# Patient Record
Sex: Female | Born: 1935 | Race: White | Hispanic: No | Smoking: Never smoker
Health system: Southern US, Community
[De-identification: ages and names within clinical notes are randomized; demographics above are authoritative.]

## PROBLEM LIST (undated history)

## (undated) DIAGNOSIS — M899 Disorder of bone, unspecified: Secondary | ICD-10-CM

## (undated) DIAGNOSIS — R112 Nausea with vomiting, unspecified: Secondary | ICD-10-CM

## (undated) DIAGNOSIS — Z85828 Personal history of other malignant neoplasm of skin: Secondary | ICD-10-CM

## (undated) DIAGNOSIS — M2540 Effusion, unspecified joint: Secondary | ICD-10-CM

## (undated) DIAGNOSIS — M1990 Unspecified osteoarthritis, unspecified site: Secondary | ICD-10-CM

## (undated) DIAGNOSIS — T4145XA Adverse effect of unspecified anesthetic, initial encounter: Secondary | ICD-10-CM

## (undated) DIAGNOSIS — M2550 Pain in unspecified joint: Secondary | ICD-10-CM

## (undated) DIAGNOSIS — H353190 Nonexudative age-related macular degeneration, unspecified eye, stage unspecified: Secondary | ICD-10-CM

## (undated) DIAGNOSIS — M810 Age-related osteoporosis without current pathological fracture: Secondary | ICD-10-CM

## (undated) DIAGNOSIS — R350 Frequency of micturition: Secondary | ICD-10-CM

## (undated) DIAGNOSIS — Z87440 Personal history of urinary (tract) infections: Secondary | ICD-10-CM

## (undated) DIAGNOSIS — K5790 Diverticulosis of intestine, part unspecified, without perforation or abscess without bleeding: Secondary | ICD-10-CM

## (undated) DIAGNOSIS — M719 Bursopathy, unspecified: Secondary | ICD-10-CM

## (undated) DIAGNOSIS — Z9889 Other specified postprocedural states: Secondary | ICD-10-CM

## (undated) DIAGNOSIS — I4891 Unspecified atrial fibrillation: Secondary | ICD-10-CM

## (undated) DIAGNOSIS — M8580 Other specified disorders of bone density and structure, unspecified site: Secondary | ICD-10-CM

## (undated) DIAGNOSIS — M7580 Other shoulder lesions, unspecified shoulder: Secondary | ICD-10-CM

## (undated) DIAGNOSIS — R51 Headache: Secondary | ICD-10-CM

## (undated) DIAGNOSIS — T8859XA Other complications of anesthesia, initial encounter: Secondary | ICD-10-CM

## (undated) DIAGNOSIS — N952 Postmenopausal atrophic vaginitis: Secondary | ICD-10-CM

## (undated) DIAGNOSIS — E785 Hyperlipidemia, unspecified: Secondary | ICD-10-CM

## (undated) DIAGNOSIS — I959 Hypotension, unspecified: Secondary | ICD-10-CM

## (undated) DIAGNOSIS — C4490 Unspecified malignant neoplasm of skin, unspecified: Secondary | ICD-10-CM

## (undated) DIAGNOSIS — K5730 Diverticulosis of large intestine without perforation or abscess without bleeding: Secondary | ICD-10-CM

## (undated) DIAGNOSIS — L719 Rosacea, unspecified: Secondary | ICD-10-CM

## (undated) DIAGNOSIS — H579 Unspecified disorder of eye and adnexa: Secondary | ICD-10-CM

## (undated) DIAGNOSIS — Z9289 Personal history of other medical treatment: Secondary | ICD-10-CM

## (undated) DIAGNOSIS — M949 Disorder of cartilage, unspecified: Secondary | ICD-10-CM

## (undated) HISTORY — PX: NOSE SURGERY: SHX723

## (undated) HISTORY — DX: Diverticulosis of intestine, part unspecified, without perforation or abscess without bleeding: K57.90

## (undated) HISTORY — PX: TOTAL ABDOMINAL HYSTERECTOMY W/ BILATERAL SALPINGOOPHORECTOMY: SHX83

## (undated) HISTORY — DX: Hyperlipidemia, unspecified: E78.5

## (undated) HISTORY — PX: BUNIONECTOMY: SHX129

## (undated) HISTORY — PX: OTHER SURGICAL HISTORY: SHX169

## (undated) HISTORY — DX: Personal history of other malignant neoplasm of skin: Z85.828

## (undated) HISTORY — DX: Diverticulosis of large intestine without perforation or abscess without bleeding: K57.30

## (undated) HISTORY — PX: JOINT REPLACEMENT: SHX530

## (undated) HISTORY — DX: Rosacea, unspecified: L71.9

## (undated) HISTORY — DX: Unspecified disorder of eye and adnexa: H57.9

## (undated) HISTORY — PX: CATARACT EXTRACTION, BILATERAL: SHX1313

## (undated) HISTORY — DX: Postmenopausal atrophic vaginitis: N95.2

## (undated) HISTORY — DX: Unspecified malignant neoplasm of skin, unspecified: C44.90

## (undated) HISTORY — PX: MOHS SURGERY: SUR867

## (undated) HISTORY — DX: Disorder of cartilage, unspecified: M94.9

## (undated) HISTORY — DX: Other specified disorders of bone density and structure, unspecified site: M85.80

## (undated) HISTORY — PX: COLONOSCOPY: SHX174

## (undated) HISTORY — PX: APPENDECTOMY: SHX54

## (undated) HISTORY — DX: Disorder of bone, unspecified: M89.9

---

## 1999-08-13 ENCOUNTER — Encounter: Payer: Self-pay | Admitting: Internal Medicine

## 1999-08-13 ENCOUNTER — Encounter: Admission: RE | Admit: 1999-08-13 | Discharge: 1999-08-13 | Payer: Self-pay | Admitting: Internal Medicine

## 2000-08-24 ENCOUNTER — Encounter: Admission: RE | Admit: 2000-08-24 | Discharge: 2000-08-24 | Payer: Self-pay | Admitting: Internal Medicine

## 2000-08-24 ENCOUNTER — Encounter: Payer: Self-pay | Admitting: Internal Medicine

## 2000-12-28 ENCOUNTER — Encounter: Payer: Self-pay | Admitting: Internal Medicine

## 2000-12-28 ENCOUNTER — Encounter: Admission: RE | Admit: 2000-12-28 | Discharge: 2000-12-28 | Payer: Self-pay | Admitting: Internal Medicine

## 2001-08-24 ENCOUNTER — Encounter: Payer: Self-pay | Admitting: Internal Medicine

## 2001-08-24 ENCOUNTER — Encounter: Admission: RE | Admit: 2001-08-24 | Discharge: 2001-08-24 | Payer: Self-pay | Admitting: Internal Medicine

## 2002-08-30 ENCOUNTER — Encounter: Payer: Self-pay | Admitting: Internal Medicine

## 2002-08-30 ENCOUNTER — Encounter: Admission: RE | Admit: 2002-08-30 | Discharge: 2002-08-30 | Payer: Self-pay | Admitting: Internal Medicine

## 2003-01-10 ENCOUNTER — Encounter: Payer: Self-pay | Admitting: Internal Medicine

## 2003-01-10 ENCOUNTER — Encounter: Admission: RE | Admit: 2003-01-10 | Discharge: 2003-01-10 | Payer: Self-pay | Admitting: Internal Medicine

## 2003-08-22 ENCOUNTER — Encounter: Admission: RE | Admit: 2003-08-22 | Discharge: 2003-08-22 | Payer: Self-pay | Admitting: Internal Medicine

## 2004-05-27 ENCOUNTER — Ambulatory Visit: Payer: Self-pay | Admitting: Internal Medicine

## 2004-09-26 ENCOUNTER — Encounter: Admission: RE | Admit: 2004-09-26 | Discharge: 2004-09-26 | Payer: Self-pay | Admitting: Internal Medicine

## 2005-09-08 ENCOUNTER — Ambulatory Visit: Payer: Self-pay | Admitting: Internal Medicine

## 2005-10-02 ENCOUNTER — Encounter: Admission: RE | Admit: 2005-10-02 | Discharge: 2005-10-02 | Payer: Self-pay | Admitting: Internal Medicine

## 2005-10-14 ENCOUNTER — Ambulatory Visit: Payer: Self-pay | Admitting: Internal Medicine

## 2005-11-20 ENCOUNTER — Ambulatory Visit: Payer: Self-pay | Admitting: Internal Medicine

## 2005-12-09 ENCOUNTER — Encounter: Admission: RE | Admit: 2005-12-09 | Discharge: 2005-12-09 | Payer: Self-pay | Admitting: Internal Medicine

## 2006-10-07 ENCOUNTER — Encounter: Admission: RE | Admit: 2006-10-07 | Discharge: 2006-10-07 | Payer: Self-pay | Admitting: Internal Medicine

## 2006-10-15 DIAGNOSIS — K5730 Diverticulosis of large intestine without perforation or abscess without bleeding: Secondary | ICD-10-CM

## 2006-10-15 HISTORY — DX: Diverticulosis of large intestine without perforation or abscess without bleeding: K57.30

## 2006-10-22 ENCOUNTER — Ambulatory Visit: Payer: Self-pay | Admitting: Internal Medicine

## 2006-10-22 DIAGNOSIS — E785 Hyperlipidemia, unspecified: Secondary | ICD-10-CM | POA: Insufficient documentation

## 2006-10-22 LAB — CONVERTED CEMR LAB: Cholesterol, target level: 200 mg/dL

## 2006-10-28 ENCOUNTER — Ambulatory Visit: Payer: Self-pay | Admitting: Internal Medicine

## 2006-11-02 ENCOUNTER — Encounter (INDEPENDENT_AMBULATORY_CARE_PROVIDER_SITE_OTHER): Payer: Self-pay | Admitting: *Deleted

## 2006-11-04 ENCOUNTER — Telehealth: Payer: Self-pay | Admitting: Internal Medicine

## 2006-11-08 ENCOUNTER — Encounter (INDEPENDENT_AMBULATORY_CARE_PROVIDER_SITE_OTHER): Payer: Self-pay | Admitting: *Deleted

## 2006-11-17 ENCOUNTER — Encounter (INDEPENDENT_AMBULATORY_CARE_PROVIDER_SITE_OTHER): Payer: Self-pay | Admitting: *Deleted

## 2006-11-22 ENCOUNTER — Telehealth: Payer: Self-pay | Admitting: Internal Medicine

## 2007-10-18 ENCOUNTER — Encounter: Admission: RE | Admit: 2007-10-18 | Discharge: 2007-10-18 | Payer: Self-pay | Admitting: Internal Medicine

## 2007-10-24 ENCOUNTER — Telehealth (INDEPENDENT_AMBULATORY_CARE_PROVIDER_SITE_OTHER): Payer: Self-pay | Admitting: *Deleted

## 2007-11-09 ENCOUNTER — Telehealth (INDEPENDENT_AMBULATORY_CARE_PROVIDER_SITE_OTHER): Payer: Self-pay | Admitting: *Deleted

## 2007-11-21 ENCOUNTER — Telehealth (INDEPENDENT_AMBULATORY_CARE_PROVIDER_SITE_OTHER): Payer: Self-pay | Admitting: *Deleted

## 2008-02-03 ENCOUNTER — Encounter (INDEPENDENT_AMBULATORY_CARE_PROVIDER_SITE_OTHER): Payer: Self-pay | Admitting: *Deleted

## 2008-03-12 ENCOUNTER — Ambulatory Visit: Payer: Self-pay | Admitting: Internal Medicine

## 2008-03-12 DIAGNOSIS — M899 Disorder of bone, unspecified: Secondary | ICD-10-CM

## 2008-03-12 DIAGNOSIS — Z85828 Personal history of other malignant neoplasm of skin: Secondary | ICD-10-CM

## 2008-03-12 DIAGNOSIS — J309 Allergic rhinitis, unspecified: Secondary | ICD-10-CM | POA: Insufficient documentation

## 2008-03-12 HISTORY — DX: Personal history of other malignant neoplasm of skin: Z85.828

## 2008-03-12 HISTORY — DX: Disorder of bone, unspecified: M89.9

## 2008-03-15 ENCOUNTER — Ambulatory Visit: Payer: Self-pay | Admitting: Internal Medicine

## 2008-03-15 ENCOUNTER — Telehealth (INDEPENDENT_AMBULATORY_CARE_PROVIDER_SITE_OTHER): Payer: Self-pay | Admitting: *Deleted

## 2008-03-17 LAB — CONVERTED CEMR LAB: Vit D, 1,25-Dihydroxy: 39 (ref 30–89)

## 2008-03-19 ENCOUNTER — Encounter (INDEPENDENT_AMBULATORY_CARE_PROVIDER_SITE_OTHER): Payer: Self-pay | Admitting: *Deleted

## 2008-03-22 ENCOUNTER — Telehealth (INDEPENDENT_AMBULATORY_CARE_PROVIDER_SITE_OTHER): Payer: Self-pay | Admitting: *Deleted

## 2008-03-23 ENCOUNTER — Encounter (INDEPENDENT_AMBULATORY_CARE_PROVIDER_SITE_OTHER): Payer: Self-pay | Admitting: *Deleted

## 2008-03-23 LAB — CONVERTED CEMR LAB
ALT: 19 units/L (ref 0–35)
AST: 24 units/L (ref 0–37)
Albumin: 3.9 g/dL (ref 3.5–5.2)
Calcium: 9.4 mg/dL (ref 8.4–10.5)
Cholesterol: 174 mg/dL (ref 0–200)
Eosinophils Relative: 3.9 % (ref 0.0–5.0)
HCT: 41.9 % (ref 36.0–46.0)
HDL: 57.9 mg/dL (ref >39.0)
Hemoglobin: 14.1 g/dL (ref 12.0–15.0)
Lymphocytes Relative: 26.5 % (ref 12.0–46.0)
MCHC: 33.7 g/dL (ref 30.0–36.0)
MCV: 92 fL (ref 78.0–100.0)
RBC: 4.56 M/uL (ref 3.87–5.11)
Sodium: 141 meq/L (ref 135–145)
TSH: 4.04 microintl units/mL (ref 0.35–5.50)
Total CHOL/HDL Ratio: 3
Total Protein: 6.8 g/dL (ref 6.0–8.3)
Triglycerides: 39 mg/dL (ref 0–149)
VLDL: 8 mg/dL (ref 0–40)

## 2008-04-10 ENCOUNTER — Encounter: Payer: Self-pay | Admitting: Internal Medicine

## 2008-04-10 ENCOUNTER — Encounter: Admission: RE | Admit: 2008-04-10 | Discharge: 2008-04-10 | Payer: Self-pay | Admitting: Internal Medicine

## 2008-04-18 ENCOUNTER — Encounter (INDEPENDENT_AMBULATORY_CARE_PROVIDER_SITE_OTHER): Payer: Self-pay | Admitting: *Deleted

## 2008-09-17 ENCOUNTER — Ambulatory Visit: Payer: Self-pay | Admitting: Internal Medicine

## 2008-09-23 LAB — CONVERTED CEMR LAB: Vit D, 25-Hydroxy: 46 ng/mL (ref 30–89)

## 2008-09-24 ENCOUNTER — Encounter (INDEPENDENT_AMBULATORY_CARE_PROVIDER_SITE_OTHER): Payer: Self-pay | Admitting: *Deleted

## 2009-01-02 ENCOUNTER — Encounter: Admission: RE | Admit: 2009-01-02 | Discharge: 2009-01-02 | Payer: Self-pay | Admitting: Internal Medicine

## 2009-04-15 ENCOUNTER — Telehealth (INDEPENDENT_AMBULATORY_CARE_PROVIDER_SITE_OTHER): Payer: Self-pay | Admitting: *Deleted

## 2009-04-22 ENCOUNTER — Ambulatory Visit: Payer: Self-pay | Admitting: Internal Medicine

## 2009-04-22 DIAGNOSIS — L719 Rosacea, unspecified: Secondary | ICD-10-CM

## 2009-04-22 DIAGNOSIS — E559 Vitamin D deficiency, unspecified: Secondary | ICD-10-CM | POA: Insufficient documentation

## 2009-04-22 HISTORY — DX: Rosacea, unspecified: L71.9

## 2009-04-23 ENCOUNTER — Ambulatory Visit: Payer: Self-pay | Admitting: Internal Medicine

## 2009-04-23 LAB — CONVERTED CEMR LAB: Vit D, 25-Hydroxy: 56 ng/mL (ref 30–89)

## 2009-04-26 ENCOUNTER — Encounter (INDEPENDENT_AMBULATORY_CARE_PROVIDER_SITE_OTHER): Payer: Self-pay | Admitting: *Deleted

## 2009-04-26 LAB — CONVERTED CEMR LAB
ALT: 24 units/L (ref 0–35)
AST: 22 units/L (ref 0–37)
Albumin: 3.7 g/dL (ref 3.5–5.2)
Alkaline Phosphatase: 46 units/L (ref 39–117)
BUN: 13 mg/dL (ref 6–23)
Bilirubin, Direct: 0 mg/dL (ref 0.0–0.3)
CO2: 29 meq/L (ref 19–32)
Chloride: 107 meq/L (ref 96–112)
Eosinophils Relative: 4.4 % (ref 0.0–5.0)
GFR calc non Af Amer: 72.12 mL/min (ref >60)
Glucose, Bld: 84 mg/dL (ref 70–99)
HDL: 46.1 mg/dL (ref >39.00)
MCHC: 33.4 g/dL (ref 30.0–36.0)
Neutrophils Relative %: 62.8 % (ref 43.0–77.0)
RBC: 4.54 M/uL (ref 3.87–5.11)
RDW: 12.6 % (ref 11.5–14.6)
Sodium: 141 meq/L (ref 135–145)
TSH: 4.64 microintl units/mL (ref 0.35–5.50)
Total Bilirubin: 0.7 mg/dL (ref 0.3–1.2)
Triglycerides: 72 mg/dL (ref 0.0–149.0)
WBC: 5.7 10*3/uL (ref 4.5–10.5)

## 2010-01-07 ENCOUNTER — Ambulatory Visit (HOSPITAL_COMMUNITY): Admission: RE | Admit: 2010-01-07 | Discharge: 2010-01-07 | Payer: Self-pay | Admitting: Internal Medicine

## 2010-04-11 ENCOUNTER — Telehealth: Payer: Self-pay | Admitting: Internal Medicine

## 2010-04-15 ENCOUNTER — Ambulatory Visit: Payer: Self-pay | Admitting: Internal Medicine

## 2010-04-21 LAB — CONVERTED CEMR LAB
Basophils Absolute: 0 10*3/uL (ref 0.0–0.1)
CO2: 30 meq/L (ref 19–32)
Calcium: 9.3 mg/dL (ref 8.4–10.5)
Creatinine, Ser: 0.9 mg/dL (ref 0.4–1.2)
Eosinophils Absolute: 0.2 10*3/uL (ref 0.0–0.7)
Eosinophils Relative: 3.2 % (ref 0.0–5.0)
GFR calc non Af Amer: 63.63 mL/min (ref >60.00)
Glucose, Bld: 82 mg/dL (ref 70–99)
HCT: 39.7 % (ref 36.0–46.0)
Hemoglobin: 13.5 g/dL (ref 12.0–15.0)
Lymphocytes Relative: 25.5 % (ref 12.0–46.0)
Lymphs Abs: 1.5 10*3/uL (ref 0.7–4.0)
MCHC: 34.1 g/dL (ref 30.0–36.0)
Monocytes Absolute: 0.6 10*3/uL (ref 0.1–1.0)
Monocytes Relative: 10.3 % (ref 3.0–12.0)
Neutrophils Relative %: 60.5 % (ref 43.0–77.0)
Platelets: 173 10*3/uL (ref 150.0–400.0)
Potassium: 4.5 meq/L (ref 3.5–5.1)
RDW: 14.4 % (ref 11.5–14.6)
Sodium: 142 meq/L (ref 135–145)
WBC: 6 10*3/uL (ref 4.5–10.5)

## 2010-05-07 ENCOUNTER — Ambulatory Visit: Payer: Self-pay | Admitting: Internal Medicine

## 2010-05-07 DIAGNOSIS — M545 Low back pain: Secondary | ICD-10-CM

## 2010-05-13 ENCOUNTER — Encounter
Admission: RE | Admit: 2010-05-13 | Discharge: 2010-05-13 | Payer: Self-pay | Source: Home / Self Care | Attending: Internal Medicine | Admitting: Internal Medicine

## 2010-05-13 ENCOUNTER — Encounter: Payer: Self-pay | Admitting: Internal Medicine

## 2010-06-12 NOTE — Assessment & Plan Note (Signed)
Summary: Heidi Moore ///SPH - [facility]CD [facility]   Vital Signs:  Patient profile:   77 year old female Height:      61.75 inches Weight:      138.6 pounds BMI:     25.65 Temp:     97.9 degrees F oral Pulse rate:   76 / minute Resp:     14 per minute BP sitting:   132 / 80  (left arm) Cuff size:   regular  Vitals Entered By: Shonna Chock CMA (May 07, 2010 1:13 PM) CC: CPX, patient refused EKG, Back pain, Lipid Management Comments Height recorded with shoes on   Vision Screening:Left eye with correction: 20 / 25 Right eye with correction: 20 / 50 Both eyes with correction: 20 / 25       Vision Comments: Patient wears reading glasses, no correction at the time of visit   Vision Entered By: Shonna Chock CMA (May 07, 2010 1:31 PM)   CC:  CPX, patient refused EKG, Back pain, and Lipid Management.  History of Present Illness: Here for Medicare AWV: 1.Risk factors based on Past M, S, F history:see diagnoses; chart updated 2.Physical Activities: walks  83mpd,aerobics 45 muin 3 x/ week, cardio slide machine 2-3x / day 3.Depression/mood: no issues 4.Hearing: slight decrease to whisper @ 6  ft bilaterally 5.ADL's: no limitations, see #2 6.Fall Risk: none 7.Home Safety: safety proofed 8.Height, weight, &visual acuity:see VS 9.Counseling:Living Wil in place; son is Executor 10.Labs ordered based on risk factors: reviwed & copy given 11.Referral Coordination:BMD study 12. Care Plan: see Instructions 13. Cognitive Assessment:Oriented X 3; memory & recall  intact ; "WORLD" spelled backwards; mood & affect normal.  Back Pain      This is an 77 year old woman who presents with Back pain for 9 months.  The patient denies chills, weakness, loss of sensation, fecal incontinence, urinary incontinence, urinary retention, and dysuria.  The pain is located across  the  low back.  The pain radiates to the right leg below the knee  less than  below the left leg below the knee.  The pain is made  worse by  lifting  and activity ( exercises).  The pain is made better by inactivity, Chiropractry  & heat.  She feels it was due to [facility]; pain has improved since that was D/Ced in 03/2010.  Lipid Management History:      Positive NCEP/ATP III risk factors include female age 72 years old or older.  Negative NCEP/ATP III risk factors include no history of early menopause without estrogen hormone replacement, non-diabetic, no family history for ischemic heart disease, non-tobacco-user status, non-hypertensive, no ASHD (atherosclerotic heart disease), no prior stroke/TIA, no peripheral vascular disease, and no history of aortic aneurysm.     Preventive Screening-Counseling & Management  Caffeine-Diet-Exercise     Caffeine use/day: 4 cups 1/2 & 1/2  Hep-HIV-STD-Contraception     Dental Visit-last 6 months yes     Sun Exposure-Excessive: no  Safety-Violence-Falls     Seat Belt Use: yes     Smoke Detectors: yes      Blood Transfusions:  yes, prior to 1987, and post partum 1949.        Travel History:  [place] 2007.    Current Medications (verified): 1)  Citrucel  Powd (Methylcellulose (Laxative)) 2)  Selenium 200 Mcg Tabs (Selenium) .[name].. 1 By Mouth Once Daily 3)  Metrogel 1 % Gel (Metronidazole) .... Apply Once Daily After Cleansing 4)  Vit D 4000iu .Marland KitchenMarland KitchenMarland Kitchen  1 By Mouth Once Daily 5)  Vitamin D 2000 Unit Tabs (Cholecalciferol) .[name].. 1 By Mouth Three Times A Day 6)  Vitamin C 500 Mg Tabs (Ascorbic Acid) .... 4 By Mouth Once Daily 7)  Vitamin E 400 Unit Caps (Vitamin E) .[name].. 1 By Mouth Once Daily 8)  Magnesium Oxide 400 Mg Tabs (Magnesium Oxide) .... 3 By Mouth Once Daily 9)  Tums 500 Mg Chew (Calcium Carbonate Antacid) .... 2-3 By Mouth Once Daily 10)  B-50  Tabs (Vitamins-Lipotropics) .[name].. 1 By Mouth Once Daily 11)  Multivitamins  Tabs (Multiple Vitamin) .[name].. 1 By Mouth Once Daily  Allergies: 1)  ! Sulfa 2)  ! Asa 3)  ! Doxycycline 4)  ! Zithromax 5)  ! Hydrocodone 6)  ! Zithromax 7)   ! Calcium (Calcium) 8)  ! Actonel 9)  ! Forteo 10)  ! Evista 11)  ! Fosamax 12)  ! Boniva (Ibandronate Sodium) 13)  ! * Anaplex Hd  Past History:  Past Medical History: PVD with aortic bruit (no AAA on [place] X2) Diverticulosis, colon Allergic rhinitis Osteopenia (Intolerance to OTC  Calcium, Actonel, Fosamax,Evista, Forteo); S/P Boniva 2007 to 03/2010 Skin cancer, PMH  of, basal cell , Dr Londell Moh Hyperlipidemia: [place] Study LDL goal = < 160.  Past Surgical History: Appendectomy with Hysterectomy & BSO  for Endometriosis; G 4 P 3; Colonoscopy : Tics(Note: no  further F/U as per GI due to age ) Cataract extraction bilat; Plastic surgery on nose X 3  post trauma age 60; Moh's surgery R nose for basal cell cancer; Bunionectomy 10/12/2008  Family History: 1st cousin: coon cancer  Mother : mini CVAs, CVA bro :DM, CAD  Social History: Never Smoked Retired Alcohol use-no Regular exercise-yes: walking 2 mpd for 40-45 min daily , aerobics 3X/week No dietCaffeine use/day:  4 cups 1/2 & 1/2 Dental Care w/in 6 mos.:  yes Sun Exposure-Excessive:  no Seat Belt Use:  yes Blood Transfusions:  yes, prior to 1987, post partum 1949  Review of Systems       The patient complains of headaches.  The patient denies anorexia, fever, weight loss, weight gain, hoarseness, chest pain, syncope, dyspnea on exertion, peripheral edema, prolonged cough, hemoptysis, abdominal pain, melena, hematochezia, severe indigestion/heartburn, hematuria, suspicious skin lesions, unusual weight change, abnormal bleeding, enlarged lymph nodes, and angioedema.         Antihistamine as needed for "sinus  headaches" , ? related to fall & nasal trauma. Dr Juanda Chance stated no  further colonoscopy monitor needed.  Physical Exam  General:  Appears younger than afe ,in no acute distress; alert,appropriate and cooperative throughout examination Head:  Normocephalic and atraumatic without obvious abnormalities.  Eyes:  No  corneal or conjunctival inflammation noted. EOMI. Perrla. Funduscopic exam benign, without hemorrhages, exudates or papilledema. Vision grossly normal. Ears:  External ear exam shows no significant lesions or deformities.  Otoscopic examination reveals clear canals, tympanic membranes are intact bilaterally without bulging, retraction, inflammation or discharge. Hearing is grossly normal bilaterally. Nose:  External nasal examination shows  post op  deformity  w/o  inflammation. Nasal mucosa are pink and moist without lesions or exudates. Mouth:  Oral mucosa and oropharynx without lesions or exudates.  Teeth in good repair. Neck:  No deformities, masses, or tenderness noted. Lungs:  Normal respiratory effort, chest expands symmetrically. Lungs are clear to auscultation, no crackles or wheezes. Heart:  Normal rate and regular rhythm. S1 and S2 normal without gallop, murmur, click, rub.S4 Abdomen:  Bowel sounds  positive,abdomen soft and non-tender without masses, organomegaly or hernias noted. Aorta palpable with bruit Genitalia:  Dr Vincente Poli Msk:  No deformity or scoliosis noted of thoracic or lumbar spine.   Pulses:  R and L carotid,radial,dorsalis pedis and posterior tibial pulses are full and equal bilaterally Extremities:  No clubbing, cyanosis, edema OA hand deformities  noted with normal full range of motion of all joints. Leg ROM to 90 degrees;mild crepitus of knees   Neurologic:  alert & oriented X3, strength normal in all extremities, and DTRs symmetrical and normal.   Skin:  Intact without suspicious lesions or rashes Cervical Nodes:  No lymphadenopathy noted Axillary Nodes:  No palpable lymphadenopathy Psych:  memory intact for recent and remote, normally interactive, and good eye contact.     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 1 by mouth monthly  Orders: Radiology Referral (Radiology): BMD  Problem # 4:  VITAMIN D DEFICIENCY (ICD-268.9) corrected  Problem # 5:  HYPERLIPIDEMIA (ICD-272.4) EKG declined  Complete Medication List: 1)  Citrucel Powd (Methylcellulose (laxative)) 2)  Selenium 200 Mcg Tabs (Selenium) .[name].. 1 by mouth once daily 3)  Metrogel 1 % Gel (Metronidazole) .... Apply once daily after cleansing 4)  Vit D 4000iu  .[name].. 1 by mouth once daily 5)  Vitamin D 2000 Unit Tabs (Cholecalciferol) .[name].. 1 by mouth three times a day 6)  Vitamin C 500 Mg Tabs (Ascorbic acid) .... 4 by mouth once daily 7)  Vitamin E 400 Unit Caps (Vitamin e) .[name].. 1 by mouth once daily 8)  Magnesium Oxide 400 Mg Tabs (Magnesium oxide) .... 3 by mouth once daily 9)  Tums 500 Mg Chew (Calcium carbonate antacid) .... 2-3 by mouth once daily 10)  B-50 Tabs (Vitamins-lipotropics) .[name].. 1 by mouth once daily 11)  Multivitamins Tabs (Multiple vitamin) .[name].. 1 by mouth once daily  Lipid Assessment/Plan:      Based on NCEP/ATP III, the patient's risk factor category is "0-1 risk factors".  The patient's lipid goals are as follows: Total cholesterol goal is 200; LDL cholesterol goal is 160; HDL cholesterol goal is 40; Triglyceride goal is 150.    Patient Instructions: 1)  Decrease vitamin D3 to 2000 International Units two times a day from three times a day . BMD every 25 months .  Please call if pain meds needed for back. Continue Chiropractry as needed .Continue excellent exercise program Prescriptions: METROGEL 1 % GEL (METRONIDAZOLE) apply once daily after cleansing  #30 gram x 5   Entered and Authorized by:   Marga Melnick MD   Signed by:   Marga Melnick MD on 05/07/2010   Method used:   Print then Give to Patient   RxID:    [id number]    Orders Added: 1)  Est. Patient Level III [44010] 2)  Medicare -1st Annual Wellness Visit [G0438] 3)  Radiology Referral [Radiology]

## 2010-06-12 NOTE — Progress Notes (Signed)
Summary: rx question/lab request  Phone Note Call from Patient Call back at Home Phone [phone]   Summary of Call: Patient called noting that she has an upcoming appt (12/28) and would like to know if she can stop Boniva. She c/o muscle pain and weakness since Feb and believes it is due to the [place]. She wants to know what you suggest for Ca+? She is also requesting vit D, cholesterol, and Ca+be done before her appt. Please advise. Initial call taken by: Lucious Groves CMA,  April 11, 2010 3:02 PM  Follow-up for Phone Call        Per MD-- ok to hold Boniva, take Ca+ 600mg  two times a day and do labs for Vit D, cbc diff, and bmet dx 733.00.  Left message on machine to call back to office. Follow-up by: Lucious Groves CMA,  April 11, 2010 3:32 PM  Additional Follow-up for Phone Call Additional follow up Details #1::        Patient notified. Lucious Groves CMA  April 11, 2010 5:02 PM

## 2010-09-26 NOTE — Assessment & Plan Note (Signed)
[facility]                                 ON-CALL NOTE   Heidi, Moore                          MRN:          [id number]  DATE:04/24/2006                            DOB:          [birth year]    DATE OF INTERACTION:  April 24, 2006 at 2:50 p.m.   PHONE NUMBER:  [phone]   OBJECTIVE:  The patient thinks she has a sinus infection.  Also has  laryngitis.  Thought she was doing better earlier in the week, but is  now getting congested again.  Hurts in her sinus area.  Denies fever.   ASSESSMENT:  Upper respiratory infection.   PLAN:  Use plain Robitussin 2 tablespoons morning and noon.  Drink lots  of fluids.  Tylenol 2 every 4 hours through the weekend.  If no better  by Monday, call for an appointment to be seen.   PRIMARY CARE [place]:  Dr. Alwyn Ren.  Home office is [place].     Arta Silence, MD  Electronically Signed    RNS/MedQ  DD: 04/25/2006  DT: 04/25/2006  Job #: [id number]

## 2010-11-04 ENCOUNTER — Ambulatory Visit (INDEPENDENT_AMBULATORY_CARE_PROVIDER_SITE_OTHER): Payer: Medicare Other | Admitting: *Deleted

## 2010-11-04 DIAGNOSIS — Z0000 Encounter for general adult medical examination without abnormal findings: Secondary | ICD-10-CM

## 2010-11-04 DIAGNOSIS — Z23 Encounter for immunization: Secondary | ICD-10-CM

## 2010-11-04 MED ORDER — ZOSTER VACCINE LIVE 19400 UNT/0.65ML ~~LOC~~ SOLR: 0.6500 mL | Freq: Once | SUBCUTANEOUS | Status: DC

## 2010-12-03 ENCOUNTER — Other Ambulatory Visit: Payer: Self-pay | Admitting: Internal Medicine

## 2010-12-03 DIAGNOSIS — Z1231 Encounter for screening mammogram for malignant neoplasm of breast: Secondary | ICD-10-CM

## 2011-01-13 ENCOUNTER — Ambulatory Visit (HOSPITAL_COMMUNITY)
Admission: RE | Admit: 2011-01-13 | Discharge: 2011-01-13 | Disposition: A | Discharge status: Home / Self Care | Payer: Medicare Other | Source: Ambulatory Visit | Attending: Internal Medicine | Admitting: Internal Medicine

## 2011-01-13 DIAGNOSIS — Z1231 Encounter for screening mammogram for malignant neoplasm of breast: Secondary | ICD-10-CM | POA: Insufficient documentation

## 2011-01-20 ENCOUNTER — Other Ambulatory Visit: Payer: Self-pay | Admitting: Internal Medicine

## 2011-01-20 DIAGNOSIS — R928 Other abnormal and inconclusive findings on diagnostic imaging of breast: Secondary | ICD-10-CM

## 2011-01-23 ENCOUNTER — Ambulatory Visit
Admission: RE | Admit: 2011-01-23 | Discharge: 2011-01-23 | Disposition: A | Discharge status: Home / Self Care | Payer: Medicare Other | Source: Ambulatory Visit | Attending: Internal Medicine | Admitting: Internal Medicine

## 2011-01-23 DIAGNOSIS — R928 Other abnormal and inconclusive findings on diagnostic imaging of breast: Secondary | ICD-10-CM

## 2011-06-08 ENCOUNTER — Other Ambulatory Visit: Payer: Self-pay | Admitting: Internal Medicine

## 2011-06-08 NOTE — Telephone Encounter (Signed)
Last RX'ed 2011, Last OV 04/2000. Pending OV 07/2011. Dr.Hopper please advise on patient's request for Metrogel

## 2011-06-08 NOTE — Telephone Encounter (Signed)
OK X1 

## 2011-08-07 ENCOUNTER — Encounter: Payer: Medicare Other | Admitting: Internal Medicine

## 2011-08-19 ENCOUNTER — Encounter: Payer: Self-pay | Admitting: Internal Medicine

## 2011-08-19 ENCOUNTER — Ambulatory Visit (INDEPENDENT_AMBULATORY_CARE_PROVIDER_SITE_OTHER): Payer: Medicare Other | Admitting: Internal Medicine

## 2011-08-19 VITALS — BP 110/66 | HR 64 | Temp 98.1°F | Resp 14 | Ht 61.08 in | Wt 134.0 lb

## 2011-08-19 DIAGNOSIS — Z0000 Encounter for general adult medical examination without abnormal findings: Secondary | ICD-10-CM

## 2011-08-19 DIAGNOSIS — E559 Vitamin D deficiency, unspecified: Secondary | ICD-10-CM

## 2011-08-19 DIAGNOSIS — E785 Hyperlipidemia, unspecified: Secondary | ICD-10-CM

## 2011-08-19 DIAGNOSIS — M899 Disorder of bone, unspecified: Secondary | ICD-10-CM

## 2011-08-19 DIAGNOSIS — K5730 Diverticulosis of large intestine without perforation or abscess without bleeding: Secondary | ICD-10-CM

## 2011-08-19 DIAGNOSIS — T887XXA Unspecified adverse effect of drug or medicament, initial encounter: Secondary | ICD-10-CM

## 2011-08-19 DIAGNOSIS — R700 Elevated erythrocyte sedimentation rate: Secondary | ICD-10-CM

## 2011-08-19 DIAGNOSIS — I719 Aortic aneurysm of unspecified site, without rupture: Secondary | ICD-10-CM

## 2011-08-19 NOTE — Patient Instructions (Addendum)
Please  schedule fasting Labs : BMET,Lipids, hepatic panel, CBC & dif, TSH,vitamin D level. PLEASE BRING THESE INSTRUCTIONS TO FOLLOW UP  LAB APPOINTMENT.This will guarantee correct labs are drawn, eliminating need for repeat blood sampling ( needle sticks ! ). Diagnoses /Codes: 268.9, 733.90,272.4,562.10

## 2011-08-19 NOTE — Progress Notes (Signed)
Subjective:    Patient ID: Heidi Moore, female    DOB: 1920/04/26, 78 y.o.   MRN: [id number]  HPI Medicare Wellness Visit:  The following psychosocial & medical history were reviewed as required by Medicare.   Social history: caffeine: 2 cups coffee/day , alcohol: no ,  tobacco use : no  & exercise : yard work,aerobics & walking .   Home & personal  safety / fall risk: no issues, activities of daily living: no limitations , seatbelt use : yes , and smoke alarm employment : yes .  Power of Attorney/Living Will status :in place  Vision ( as recorded per Nurse) & Hearing  evaluation :  Ophth exam 01/13; see exam Orientation :oriented x 3 , memory & recall :good, spelling  testing:good ,and mood & affect : normal. Depression / anxiety: denied Travel history : [place] 1998 , immunization status :up to date , transfusion history:  After 2 nd child birth, and preventive health surveillance ( colonoscopies, BMD , etc as per protocol/ SOC): no colonoscopy needed, Dental care:@ least annually   . Chart reviewed &  Updated. Active issues reviewed & addressed.       Review of Systems Her gynecologist has prescribed hormone replacement therapy. She has dyslipidemia but has not taken a statin. She also has osteopenia but has been intolerant to bisphosphonate therapies. She takes TUMS and a total dose of 2000 mg daily. She is also on 3000 international units of vitamin D 3 daily.     Objective:   Physical Exam Gen.: Healthy and well-nourished in appearance. Alert, appropriate and cooperative throughout exam. Appears younger than stated age  Head: Normocephalic without obvious abnormalities Eyes: No corneal or conjunctival inflammation noted. Pupils equal round reactive to light and accommodation.  Extraocular motion intact. Vision grossly normal. Ears: External  ear exam reveals no significant lesions or deformities. Canals clear .TMs normal. Hearing is grossly decreased bilaterally. Nose:  Nasal  mucosa are pink and moist. No lesions or exudates noted.  Mouth: Oral mucosa and oropharynx reveal no lesions or exudates. Teeth in good repair. Neck: No deformities, masses, or tenderness noted. Range of motion & Thyroid normal Lungs: Normal respiratory effort; chest expands symmetrically. Lungs are clear to auscultation without rales, wheezes, or increased work of breathing. Heart:  there is slight variations and rhythm with a grade 1/2 systolic murmur Abdomen: Bowel sounds normal; abdomen soft and nontender. No masses, organomegaly or hernias noted. An aortic bruit is present; clinically there may be some enlargement of the aorta Genitalia: Dr Henderson Cloud                                                 Musculoskeletal/extremities: lordosis  noted of  the thoracic  spine. No clubbing, cyanosis, edema. Significant  osteoarthritis deformities of fingers noted. Range of motion  normal .Tone & strength  normal.Joints normal. Nail health  good. Vascular: Carotid, radial artery, dorsalis pedis and  posterior tibial pulses are full and equal. Neurologic: Alert and oriented x3. Deep tendon reflexes symmetrical and normal.          Skin: Intact without suspicious lesions or rashes. Lymph: No cervical, axillary lymphadenopathy present. Psych: Initially rejecting recommended studies ; "but it has been OK in past"          Assessment & Plan:  #1 Medicare Wellness Exam; criteria  met ; data entered #2 Problem List reviewed ; Assessment/ Recommendations made  #3 slight cardiac irregularity and insignificant murmur. EKG recommended  #4 clinical aortic enlargement; aortic bruit present. Ultrasound aorta recommended Plan: see Orders

## 2011-08-20 ENCOUNTER — Other Ambulatory Visit (INDEPENDENT_AMBULATORY_CARE_PROVIDER_SITE_OTHER): Payer: Medicare Other

## 2011-08-20 DIAGNOSIS — Z0000 Encounter for general adult medical examination without abnormal findings: Secondary | ICD-10-CM

## 2011-08-20 DIAGNOSIS — E559 Vitamin D deficiency, unspecified: Secondary | ICD-10-CM

## 2011-08-20 DIAGNOSIS — M949 Disorder of cartilage, unspecified: Secondary | ICD-10-CM

## 2011-08-20 DIAGNOSIS — T887XXA Unspecified adverse effect of drug or medicament, initial encounter: Secondary | ICD-10-CM

## 2011-08-20 DIAGNOSIS — E785 Hyperlipidemia, unspecified: Secondary | ICD-10-CM

## 2011-08-20 DIAGNOSIS — M899 Disorder of bone, unspecified: Secondary | ICD-10-CM

## 2011-08-20 LAB — HEPATIC FUNCTION PANEL
Albumin: 3.9 g/dL (ref 3.5–5.2)
Alkaline Phosphatase: 48 U/L (ref 39–117)
Bilirubin, Direct: 0.1 mg/dL (ref 0.0–0.3)
Total Bilirubin: 0.4 mg/dL (ref 0.3–1.2)

## 2011-08-20 LAB — LIPID PANEL
LDL Cholesterol: 110 mg/dL — ABNORMAL HIGH (ref 0–99)
Total CHOL/HDL Ratio: 3

## 2011-08-20 LAB — BASIC METABOLIC PANEL
BUN: 16 mg/dL (ref 6–23)
CO2: 27 mEq/L (ref 19–32)
Chloride: 107 mEq/L (ref 96–112)
Creatinine, Ser: 0.9 mg/dL (ref 0.4–1.2)
Glucose, Bld: 87 mg/dL (ref 70–99)

## 2011-08-20 LAB — CBC WITH DIFFERENTIAL/PLATELET
Basophils Relative: 1.1 % (ref 0.0–3.0)
Eosinophils Absolute: 0.2 10*3/uL (ref 0.0–0.7)
MCHC: 33.5 g/dL (ref 30.0–36.0)
MCV: 91.6 fl (ref 78.0–100.0)
Monocytes Absolute: 0.5 10*3/uL (ref 0.1–1.0)
Neutro Abs: 3.4 10*3/uL (ref 1.4–7.7)
Neutrophils Relative %: 61.6 % (ref 43.0–77.0)
RBC: 4.49 Mil/uL (ref 3.87–5.11)
RDW: 14.2 % (ref 11.5–14.6)

## 2011-08-21 LAB — VITAMIN D 25 HYDROXY (VIT D DEFICIENCY, FRACTURES): Vit D, 25-Hydroxy: 51 ng/mL (ref 30–89)

## 2011-08-31 ENCOUNTER — Encounter (INDEPENDENT_AMBULATORY_CARE_PROVIDER_SITE_OTHER): Payer: Medicare Other

## 2011-08-31 DIAGNOSIS — R0989 Other specified symptoms and signs involving the circulatory and respiratory systems: Secondary | ICD-10-CM

## 2011-08-31 DIAGNOSIS — I719 Aortic aneurysm of unspecified site, without rupture: Secondary | ICD-10-CM

## 2011-11-06 ENCOUNTER — Other Ambulatory Visit: Payer: Self-pay | Admitting: Internal Medicine

## 2011-11-06 MED ORDER — DICLOFENAC SODIUM 1 % TD GEL: 1.0000 "application " | Freq: Four times a day (QID) | TRANSDERMAL | Status: DC

## 2011-11-06 NOTE — Telephone Encounter (Signed)
I called patient, patient states she has fluid on left knee since May 2,2013. Patient seen Marlan Palau (injection given) and was told she needs to call her regular doctor to see about getting Voltaren Gel.   Dr.Hopper please advise  (Last OV 08/19/2011-CPX)

## 2011-11-06 NOTE — Telephone Encounter (Signed)
patient would like votrin for pain & inflammation, please all at [phone]

## 2011-11-06 NOTE — Telephone Encounter (Signed)
OK X1 , as per package insert

## 2011-11-18 ENCOUNTER — Ambulatory Visit: Payer: Medicare Other | Attending: Family Medicine

## 2011-11-18 DIAGNOSIS — M25519 Pain in unspecified shoulder: Secondary | ICD-10-CM | POA: Insufficient documentation

## 2011-11-18 DIAGNOSIS — M25669 Stiffness of unspecified knee, not elsewhere classified: Secondary | ICD-10-CM | POA: Insufficient documentation

## 2011-11-18 DIAGNOSIS — M25569 Pain in unspecified knee: Secondary | ICD-10-CM | POA: Insufficient documentation

## 2011-11-18 DIAGNOSIS — R5381 Other malaise: Secondary | ICD-10-CM | POA: Insufficient documentation

## 2011-11-18 DIAGNOSIS — IMO0001 Reserved for inherently not codable concepts without codable children: Secondary | ICD-10-CM | POA: Insufficient documentation

## 2011-11-24 ENCOUNTER — Encounter: Payer: Medicare Other | Admitting: Physical Therapy

## 2011-11-26 ENCOUNTER — Encounter: Payer: Medicare Other | Admitting: Physical Therapy

## 2011-12-01 ENCOUNTER — Encounter: Payer: Medicare Other | Admitting: Physical Therapy

## 2011-12-03 ENCOUNTER — Encounter: Payer: Medicare Other | Admitting: Physical Therapy

## 2011-12-07 ENCOUNTER — Other Ambulatory Visit: Payer: Self-pay | Admitting: Internal Medicine

## 2011-12-08 ENCOUNTER — Telehealth: Payer: Self-pay | Admitting: Internal Medicine

## 2011-12-08 MED ORDER — NITROFURANTOIN MONOHYD MACRO 100 MG PO CAPS: 100.0000 mg | ORAL_CAPSULE | Freq: Two times a day (BID) | ORAL | Status: AC

## 2011-12-08 NOTE — Telephone Encounter (Signed)
Caller: Beth/Patient; PCP: Marga Melnick;;  Call regarding Urinary Pain- onset 12/04/11- she is having frequency and burning with urination. AFEBRILE. Taking Tramadol-HCL 50 mgs q 6 hr.for knee pain- being treated by Dr. Prince Rome [facility].  Urine Cultures done at his office and came back +/ RESULTS FAXED TO DR. HOPPER TODAY- 12/08/11. She has sensitive stomach BUT DOES WELL WITH CLEOCIN. USES [facility] ON [address]. PLEASE F/U WITH PT TO LET HER KNOW IF ANTIBIOTIC CALLED IN AND IF F/U APPNT NEEDED. CB#: [phone]. Triage and Care advice per Urinary Sx Protocol.

## 2011-12-08 NOTE — Telephone Encounter (Signed)
Please obtain fax results of urine culture

## 2011-12-08 NOTE — Telephone Encounter (Signed)
Discuss with patient, Rx sent per Dr Alwyn Ren nitrofurantion 100 mg bid #14. Awaiting results of culture.

## 2011-12-08 NOTE — Telephone Encounter (Signed)
Caller: Bessie/Patient is calling with a question about Clindamycin.The medication was written by Marga Melnick. States that she called earlier and has also had UA results faxed to Dr. Alwyn Ren with request for ABX to be called in and nothing has been called in to [facility] listed in prvious note by Margarita Rana, RN. Spoke with office, states that they will talk with Dr. Alwyn Ren and someone will call pt back before "end of day today." PT agreed.

## 2011-12-08 NOTE — Telephone Encounter (Signed)
Spoke with Dr. Prince Rome [facility] office per them fax sent around 10 am this morning. Requested that fax be re-sent to side B to ensure Dr Alwyn Ren received fax. Fax received and placed on ledge for Dr hopper to review.

## 2011-12-08 NOTE — Telephone Encounter (Signed)
[facility] performed a urinalysis 7/29 ( specimen [id number]) which revealed 11-20 white cells and many bacteria. Please request they culture the urine

## 2011-12-15 ENCOUNTER — Other Ambulatory Visit: Payer: Self-pay | Admitting: Internal Medicine

## 2011-12-15 DIAGNOSIS — Z1231 Encounter for screening mammogram for malignant neoplasm of breast: Secondary | ICD-10-CM

## 2012-01-28 ENCOUNTER — Ambulatory Visit
Admission: RE | Admit: 2012-01-28 | Discharge: 2012-01-28 | Disposition: A | Discharge status: Home / Self Care | Payer: Medicare Other | Source: Ambulatory Visit | Attending: Internal Medicine | Admitting: Internal Medicine

## 2012-01-28 DIAGNOSIS — Z1231 Encounter for screening mammogram for malignant neoplasm of breast: Secondary | ICD-10-CM

## 2012-05-02 ENCOUNTER — Other Ambulatory Visit (HOSPITAL_COMMUNITY): Payer: Self-pay | Admitting: Orthopaedic Surgery

## 2012-05-05 ENCOUNTER — Encounter (HOSPITAL_COMMUNITY): Payer: Self-pay | Admitting: Pharmacy Technician

## 2012-05-07 NOTE — Pre-Procedure Instructions (Signed)
20 Heidi Moore  05/07/2012   Your procedure is scheduled on:  Wed, Jan 8 @ 12:53 PM  Report to [facility] at 10:45 AM.  Call this number if you have problems the morning of surgery: [phone]   Remember:   Do not eat food:After Midnight.      Do not wear jewelry, make-up or nail polish.  Do not wear lotions, powders, or perfumes. You may wear deodorant.  Do not shave 48 hours prior to surgery.   Do not bring valuables to the hospital.  Contacts, dentures or bridgework may not be worn into surgery.  Leave suitcase in the car. After surgery it may be brought to your room.  For patients admitted to the hospital, checkout time is 11:00 AM the day of discharge.   Patients discharged the day of surgery will not be allowed to drive home.    Please read over the following fact sheets that you were given: Pain Booklet, Coughing and Deep Breathing, Blood Transfusion Information, Total Joint Packet, MRSA Information and Surgical Site Infection Prevention

## 2012-05-09 ENCOUNTER — Encounter (HOSPITAL_COMMUNITY)
Admission: RE | Admit: 2012-05-09 | Discharge: 2012-05-09 | Disposition: A | Discharge status: Home / Self Care | Payer: Medicare Other | Source: Ambulatory Visit | Attending: Orthopaedic Surgery | Admitting: Orthopaedic Surgery

## 2012-05-09 ENCOUNTER — Encounter (HOSPITAL_COMMUNITY): Payer: Self-pay

## 2012-05-09 ENCOUNTER — Ambulatory Visit (HOSPITAL_COMMUNITY)
Admission: RE | Admit: 2012-05-09 | Discharge: 2012-05-09 | Disposition: A | Discharge status: Home / Self Care | Payer: Medicare Other | Source: Ambulatory Visit | Attending: Orthopaedic Surgery | Admitting: Orthopaedic Surgery

## 2012-05-09 DIAGNOSIS — J438 Other emphysema: Secondary | ICD-10-CM | POA: Insufficient documentation

## 2012-05-09 DIAGNOSIS — Z01811 Encounter for preprocedural respiratory examination: Secondary | ICD-10-CM | POA: Insufficient documentation

## 2012-05-09 HISTORY — DX: Adverse effect of unspecified anesthetic, initial encounter: T41.45XA

## 2012-05-09 HISTORY — DX: Other complications of anesthesia, initial encounter: T88.59XA

## 2012-05-09 LAB — URINALYSIS, ROUTINE W REFLEX MICROSCOPIC
Glucose, UA: NEGATIVE mg/dL
Hgb urine dipstick: NEGATIVE
Specific Gravity, Urine: 1.008 (ref 1.005–1.030)
pH: 6 (ref 5.0–8.0)

## 2012-05-09 LAB — COMPREHENSIVE METABOLIC PANEL
ALT: 20 U/L (ref 0–35)
AST: 24 U/L (ref 0–37)
Alkaline Phosphatase: 58 U/L (ref 39–117)
Calcium: 10.3 mg/dL (ref 8.4–10.5)
GFR calc Af Amer: 66 mL/min — ABNORMAL LOW (ref >90)
Glucose, Bld: 108 mg/dL — ABNORMAL HIGH (ref 70–99)
Potassium: 3.6 mEq/L (ref 3.5–5.1)
Sodium: 141 mEq/L (ref 135–145)
Total Protein: 6.9 g/dL (ref 6.0–8.3)

## 2012-05-09 LAB — SURGICAL PCR SCREEN
MRSA, PCR: NEGATIVE
Staphylococcus aureus: NEGATIVE

## 2012-05-09 LAB — URINE MICROSCOPIC-ADD ON

## 2012-05-09 LAB — CBC
Hemoglobin: 12.6 g/dL (ref 12.0–15.0)
MCH: 29.3 pg (ref 26.0–34.0)
MCHC: 31.9 g/dL (ref 30.0–36.0)
Platelets: 204 10*3/uL (ref 150–400)
RBC: 4.3 MIL/uL (ref 3.87–5.11)

## 2012-05-09 LAB — APTT: aPTT: 32 seconds (ref 24–37)

## 2012-05-09 LAB — TYPE AND SCREEN

## 2012-05-09 LAB — ABO/RH: ABO/RH(D): O NEG

## 2012-05-09 NOTE — Consult Note (Addendum)
Anesthesia Note:  Patient is a 78 year old female scheduled for left TKA on 05/18/12 by Dr. Ophelia Charter.  This case is posted to be done under [place].  History includes non-smoker, HLD, osteopenia, skin cancer, diverticulosis, cataract extraction, hysterectomy, nasal surgery, left knee arthroscopy 12/2011.  She was evaluated for a AAA by ultrasound on 08/31/11 and had a normal abdominal aorta and iliac arteries.  PCP is listed as Dr. Kriste Basque (not seen yet).  (Previously seen by Dr. Marga Melnick.)  VS @ PAT BP 122/87, HR 83, T 36.4 C, R 20, O2 sat 94%.  Meds include Metrogel and estradiol, otherwise vitamins.  Dr. Ophelia Charter ordered an anesthesia consult.  She had multiple questions--some of which I had to defer back to Dr. Ophelia Charter.  Her primary anesthesia concern is reports of a severe headache lasting > 24 hours following her knee arthroscopy in August 2013.  She had not had this particular response after anesthesia in the past and hopes to avoid it in the future.  She is also sensitive to multiple medications (see allergy/intolerance list), mainly that cause N/V.  Fortunately, she has not had any severe N/V with anesthesia.  I have requested her last two anesthesia records for review by her assigned anesthesiologist on the day of surgery.  These reports should provide more specific information on the anesthetics used to assist in her current anesthesia plan.    EKG on 08/19/11 showed NSR, occasional PACs.  She denies history of chest pain, MI, CHF.  She has never had a stress/echo/cath.  Up until she injured her left knee around May of 2013, she was walking 2 miles/day and doing water aerobics 3X/week.  She denies any significant SOB or ankle edema.  She has had left knee swelling with multiple aspirations since May.  Exam shows her heart with a RRR, no murmur noted.  Lungs clear.  No carotid bruits.  No significant ankle edema noted.    CXR on 05/09/12 showed emphysema without acute cardiopulmonary  process.  Pre-operative labs noted.  I briefly reviewed above with Anesthesiologist Dr. Gelene Mink.  Although she is 78 years old, she has been very active until her injury earlier this year and is without CV symptoms.   If no significant change in her status then anticipate she can proceed as planned.  Her anesthesiologist will discuss the definitve anesthesia plan with patient and her daughter on the day of surgery.    Shonna Chock, [name] 05/09/12 1730  Addendum: 05/12/12 1300 Anesthesia records from 10/12/08 from [facility] and 12/17/11 from [facility] are on patient's chart for review by has assigned anesthesiologist.

## 2012-05-09 NOTE — Progress Notes (Signed)
Pt. And Dr Ophelia Charter request anesthesia consult. Will refer to Shonna Chock

## 2012-05-17 DIAGNOSIS — M1712 Unilateral primary osteoarthritis, left knee: Secondary | ICD-10-CM | POA: Diagnosis present

## 2012-05-17 MED ORDER — CEFAZOLIN SODIUM-DEXTROSE 2-3 GM-% IV SOLR
2.0000 g | INTRAVENOUS | Status: AC
  Administered 2012-05-18: 2 g via INTRAVENOUS
  Filled 2012-05-17: qty 50

## 2012-05-17 NOTE — H&P (Addendum)
TOTAL KNEE ADMISSION H&P  Patient is being admitted for left total knee arthroplasty.  Subjective:  Chief Complaint:left knee pain.  HPI: Heidi Moore, 79 y.o. female, has a history of pain and functional disability in the left knee due to arthritis and has failed non-surgical conservative treatments for greater than 12 weeks to includeNSAID's and/or analgesics, corticosteriod injections and activity modification.  Onset of symptoms was gradual, starting 5 years ago with gradually worsening course since that time. The patient noted prior procedures on the knee to include  arthroscopy on the left knee(s).  Patient currently rates pain in the left knee(s) at 8 out of 10 with activity. Patient has worsening of pain with activity and weight bearing, crepitus and joint swelling.  Patient has evidence of periarticular osteophytes and joint space narrowing by imaging studies. This patient has had previous partial medial and lateral meniscectomies secondary to MRI findings of degenerative meniscal tears.. There is no active infection.  Patient Active Problem List   Diagnosis Date Noted  . Osteoarthritis of left knee 05/17/2012    Class: Diagnosis of  . Unspecified adverse effect of unspecified drug, medicinal and biological substance 08/19/2011  . LOW BACK PAIN SYNDROME 05/07/2010  . VITAMIN D DEFICIENCY 04/22/2009  . ROSACEA 04/22/2009  . ALLERGIC RHINITIS 03/12/2008  . OSTEOPENIA 03/12/2008  . SKIN CANCER, HX OF 03/12/2008  . HYPERLIPIDEMIA 10/22/2006  . DIVERTICULOSIS, COLON 10/15/2006   Past Medical History  Diagnosis Date  . PVD (peripheral vascular disease)     No AAA on U/S x 2   . Diverticulosis   . Allergic rhinitis   . Osteopenia     Intolerance to Calcium,Actonel,Fosamax,Evista, Forteo: S/P Boniva 2007 to 03/2010  . History of skin cancer     Basal cell, Dr. Leta Speller  . Hyperlipidemia     [place] Study LDL goal =<160   . Complication of anesthesia     headaches      Past Surgical History  Procedure Date  . Total abdominal hysterectomy w/ bilateral salpingoophorectomy     for Endometriosis   . Colonoscopy     with Tics (no further f/u as per GI due to age)   . Cataract extraction, bilateral   . Nose surgery     due to trama at age 69   . Mohs surgery     right nostril for basel cell cancer   . Bunionectomy 10/12/2008  . Lt knee      menis    Prescriptions prior to admission  Medication Sig Dispense Refill  . acetaminophen (TYLENOL) 500 MG tablet Take 500 mg by mouth every 6 (six) hours as needed. For pain      . B Complex-C (B-COMPLEX WITH VITAMIN C) tablet Take 1 tablet by mouth daily.      . beta carotene w/minerals (OCUVITE) tablet Take 1 tablet by mouth daily.      . cholecalciferol (VITAMIN D) 1000 UNITS tablet Take 1,000 Units by mouth 3 (three) times daily.      . diphenhydramine-acetaminophen (TYLENOL PM) 25-500 MG TABS Take 1 tablet by mouth at bedtime as needed. For pain      . estradiol (CLIMARA - DOSED IN MG/24 HR) 0.0375 mg/24hr Place 1 patch onto the skin once a week.      . Magnesium 250 MG TABS Take 750 mg by mouth at bedtime.      [name] METROGEL 1 % gel APPLY ONCE DAILY AFTER CLEANING  60 g  0  .  Multiple Vitamin (MULTIVITAMIN WITH MINERALS) TABS Take 1 tablet by mouth daily.      . vitamin C (ASCORBIC ACID) 500 MG tablet Take 500 mg by mouth 2 (two) times daily.      . vitamin E (VITAMIN E) 400 UNIT capsule Take 400 Units by mouth daily.       Allergies  Allergen Reactions  . Alendronate Sodium Nausea And Vomiting  . Anaplex Hd Nausea And Vomiting  . Aspirin Nausea And Vomiting  . Azithromycin Nausea And Vomiting  . Calcium Nausea And Vomiting  . Codeine Nausea And Vomiting  . Doxycycline Nausea And Vomiting  . Hydrocodone Nausea And Vomiting  . Ibandronate Sodium     REACTION: severe body aches  . Ibuprofen Nausea And Vomiting  . Penicillins Nausea And Vomiting  . Raloxifene Nausea And Vomiting  . Risedronate Sodium  Nausea And Vomiting  . Sulfonamide Derivatives Nausea And Vomiting  . Teriparatide Nausea And Vomiting    History  Substance Use Topics  . Smoking status: Never Smoker   . Smokeless tobacco: Not on file  . Alcohol Use: No    Family History  Problem Relation Age of Onset  . Colon cancer Other     1st Cousin   . Stroke Mother 37  . Diabetes Brother   . Atrial fibrillation Brother     coumadin  . Lung cancer Sister      Review of Systems  Constitutional: Negative.   HENT: Negative.   Eyes: Negative.   Respiratory: Negative.   Cardiovascular: Negative.   Gastrointestinal: Negative.   Genitourinary: Negative.   Musculoskeletal: Positive for back pain, joint pain and falls.  Skin: Negative.   Neurological: Negative.   Endo/Heme/Allergies: Negative.   Psychiatric/Behavioral: Negative.     Objective:  Physical Exam  Constitutional: She is oriented to person, place, and time. She appears well-developed and well-nourished.  HENT:  Head: Normocephalic and atraumatic.  Eyes: EOM are normal. Pupils are equal, round, and reactive to light.  Neck: Normal range of motion. Neck supple.  Cardiovascular: Normal rate, regular rhythm and intact distal pulses.   Respiratory: Effort normal and breath sounds normal.  GI: Soft. Bowel sounds are normal.  Musculoskeletal:       Left knee ROM 0-110 degrees.  Tender joint line.  +effusion.  Crepitus with ROM.    Neurological: She is alert and oriented to person, place, and time.  Skin: Skin is warm and dry.  Psychiatric: She has a normal mood and affect.    Vital signs in last 24 hours: Temp:  [98 F (36.7 C)] 98 F (36.7 C) (01/08 1106) Pulse Rate:  [90] 90  (01/08 1106) Resp:  [20] 20  (01/08 1106) BP: (135)/(72) 135/72 mmHg (01/08 1106) SpO2:  [96 %] 96 % (01/08 1106)  Labs:   Estimated Body mass index is 25.26 kg/(m^2) as calculated from the following:   Height as of 08/19/11: 5' 1.075"(1.551 m).   Weight as of 08/19/11: 134  lb(60.782 kg).   Imaging Review Plain radiographs demonstrate severe degenerative joint disease of the left knee(s). The overall alignment isneutral. The bone quality appears to be adequate for age and reported activity level.  Assessment/Plan:  End stage arthritis, left knee   The patient history, physical examination, clinical judgment of the provider and imaging studies are consistent with end stage degenerative joint disease of the left knee(s) and total knee arthroplasty is deemed medically necessary. The treatment options including medical management, injection therapy arthroscopy and  arthroplasty were discussed at length. The risks and benefits of total knee arthroplasty were presented and reviewed. The risks due to aseptic loosening, infection, stiffness, patella tracking problems, thromboembolic complications and other imponderables were discussed. The patient acknowledged the explanation, agreed to proceed with the plan and consent was signed. Patient is being admitted for inpatient treatment for surgery, pain control, PT, OT, prophylactic antibiotics, VTE prophylaxis, progressive ambulation and ADL's and discharge planning. The patient is planning to be discharged home with home health services

## 2012-05-18 ENCOUNTER — Inpatient Hospital Stay (HOSPITAL_COMMUNITY)
Admission: RE | Admit: 2012-05-18 | Discharge: 2012-05-23 | DRG: 470 | Disposition: A | Discharge status: Skilled Nursing Facility | Payer: Medicare Other | Source: Ambulatory Visit | Attending: Orthopaedic Surgery | Admitting: Orthopaedic Surgery

## 2012-05-18 ENCOUNTER — Inpatient Hospital Stay (HOSPITAL_COMMUNITY): Payer: Medicare Other | Admitting: Vascular Surgery

## 2012-05-18 ENCOUNTER — Encounter (HOSPITAL_COMMUNITY): Payer: Self-pay | Admitting: *Deleted

## 2012-05-18 ENCOUNTER — Encounter (HOSPITAL_COMMUNITY)
Admission: RE | Disposition: A | Discharge status: Skilled Nursing Facility | Payer: Self-pay | Source: Ambulatory Visit | Attending: Orthopaedic Surgery

## 2012-05-18 ENCOUNTER — Encounter (HOSPITAL_COMMUNITY): Payer: Self-pay | Admitting: Vascular Surgery

## 2012-05-18 DIAGNOSIS — Z833 Family history of diabetes mellitus: Secondary | ICD-10-CM

## 2012-05-18 DIAGNOSIS — Z7982 Long term (current) use of aspirin: Secondary | ICD-10-CM

## 2012-05-18 DIAGNOSIS — Z79899 Other long term (current) drug therapy: Secondary | ICD-10-CM

## 2012-05-18 DIAGNOSIS — M949 Disorder of cartilage, unspecified: Secondary | ICD-10-CM | POA: Diagnosis present

## 2012-05-18 DIAGNOSIS — M1710 Unilateral primary osteoarthritis, unspecified knee: Principal | ICD-10-CM | POA: Diagnosis present

## 2012-05-18 DIAGNOSIS — M899 Disorder of bone, unspecified: Secondary | ICD-10-CM | POA: Diagnosis present

## 2012-05-18 DIAGNOSIS — E785 Hyperlipidemia, unspecified: Secondary | ICD-10-CM | POA: Diagnosis present

## 2012-05-18 DIAGNOSIS — I739 Peripheral vascular disease, unspecified: Secondary | ICD-10-CM | POA: Diagnosis present

## 2012-05-18 DIAGNOSIS — Z823 Family history of stroke: Secondary | ICD-10-CM

## 2012-05-18 DIAGNOSIS — Z85828 Personal history of other malignant neoplasm of skin: Secondary | ICD-10-CM

## 2012-05-18 DIAGNOSIS — M1712 Unilateral primary osteoarthritis, left knee: Secondary | ICD-10-CM | POA: Diagnosis present

## 2012-05-18 DIAGNOSIS — R3915 Urgency of urination: Secondary | ICD-10-CM | POA: Diagnosis not present

## 2012-05-18 HISTORY — PX: KNEE ARTHROPLASTY: SHX992

## 2012-05-18 SURGERY — ARTHROPLASTY, KNEE, TOTAL, USING IMAGELESS COMPUTER-ASSISTED NAVIGATION
Anesthesia: General | Site: Knee | Laterality: Left | Wound class: Clean

## 2012-05-18 MED ORDER — OCUVITE PO TABS
1.0000 | ORAL_TABLET | Freq: Every day | ORAL | Status: DC
  Administered 2012-05-18 – 2012-05-23 (×6): 1 via ORAL
  Filled 2012-05-18 (×6): qty 1

## 2012-05-18 MED ORDER — ONDANSETRON HCL 4 MG PO TABS
4.0000 mg | ORAL_TABLET | Freq: Four times a day (QID) | ORAL | Status: DC | PRN
  Administered 2012-05-21: 4 mg via ORAL
  Filled 2012-05-18: qty 1

## 2012-05-18 MED ORDER — PHENYLEPHRINE HCL 10 MG/ML IJ SOLN
INTRAMUSCULAR | Status: DC | PRN
  Administered 2012-05-18 (×3): 40 ug via INTRAVENOUS

## 2012-05-18 MED ORDER — ADULT MULTIVITAMIN W/MINERALS CH
1.0000 | ORAL_TABLET | Freq: Every day | ORAL | Status: DC
  Administered 2012-05-18 – 2012-05-23 (×6): 1 via ORAL
  Filled 2012-05-18 (×6): qty 1

## 2012-05-18 MED ORDER — MORPHINE SULFATE 2 MG/ML IJ SOLN
2.0000 mg | INTRAMUSCULAR | Status: DC | PRN
  Administered 2012-05-18: 2 mg via INTRAVENOUS

## 2012-05-18 MED ORDER — METOCLOPRAMIDE HCL 10 MG PO TABS: 5.0000 mg | ORAL_TABLET | Freq: Three times a day (TID) | ORAL | Status: DC | PRN

## 2012-05-18 MED ORDER — ROPIVACAINE HCL 5 MG/ML IJ SOLN
INTRAMUSCULAR | Status: DC | PRN
  Administered 2012-05-18: 100 mg via EPIDURAL

## 2012-05-18 MED ORDER — DIPHENHYDRAMINE HCL 12.5 MG/5ML PO ELIX: 12.5000 mg | ORAL_SOLUTION | ORAL | Status: DC | PRN

## 2012-05-18 MED ORDER — ASPIRIN EC 325 MG PO TBEC
325.0000 mg | DELAYED_RELEASE_TABLET | Freq: Every day | ORAL | Status: DC
  Filled 2012-05-18 (×6): qty 1

## 2012-05-18 MED ORDER — METHOCARBAMOL 500 MG PO TABS
500.0000 mg | ORAL_TABLET | Freq: Four times a day (QID) | ORAL | Status: DC | PRN
  Administered 2012-05-19 – 2012-05-22 (×2): 500 mg via ORAL
  Filled 2012-05-18 (×2): qty 1

## 2012-05-18 MED ORDER — METOCLOPRAMIDE HCL 5 MG/ML IJ SOLN
5.0000 mg | Freq: Three times a day (TID) | INTRAMUSCULAR | Status: DC | PRN
  Administered 2012-05-19: 10 mg via INTRAVENOUS
  Filled 2012-05-18: qty 2

## 2012-05-18 MED ORDER — ONDANSETRON HCL 4 MG/2ML IJ SOLN
INTRAMUSCULAR | Status: DC | PRN
  Administered 2012-05-18: 4 mg via INTRAVENOUS

## 2012-05-18 MED ORDER — HYDROMORPHONE HCL PF 1 MG/ML IJ SOLN
0.2500 mg | INTRAMUSCULAR | Status: DC | PRN
  Administered 2012-05-18 (×2): 0.5 mg via INTRAVENOUS

## 2012-05-18 MED ORDER — FENTANYL CITRATE 0.05 MG/ML IJ SOLN
50.0000 ug | INTRAMUSCULAR | Status: AC | PRN
  Administered 2012-05-18 (×2): 50 ug via INTRAVENOUS

## 2012-05-18 MED ORDER — ESTRADIOL 0.0375 MG/24HR TD PTWK
0.0375 mg | MEDICATED_PATCH | TRANSDERMAL | Status: DC
  Filled 2012-05-18: qty 1

## 2012-05-18 MED ORDER — BISACODYL 10 MG RE SUPP
10.0000 mg | Freq: Every day | RECTAL | Status: DC | PRN
  Administered 2012-05-22: 10 mg via RECTAL
  Filled 2012-05-18: qty 1

## 2012-05-18 MED ORDER — VITAMIN D3 25 MCG (1000 UNIT) PO TABS
1000.0000 [IU] | ORAL_TABLET | Freq: Three times a day (TID) | ORAL | Status: DC
  Administered 2012-05-18 – 2012-05-23 (×14): 1000 [IU] via ORAL
  Filled 2012-05-18 (×17): qty 1

## 2012-05-18 MED ORDER — SENNOSIDES-DOCUSATE SODIUM 8.6-50 MG PO TABS
1.0000 | ORAL_TABLET | Freq: Every evening | ORAL | Status: DC | PRN
  Administered 2012-05-20: 1 via ORAL
  Filled 2012-05-18: qty 1

## 2012-05-18 MED ORDER — CEFAZOLIN SODIUM 1-5 GM-% IV SOLN
1.0000 g | Freq: Two times a day (BID) | INTRAVENOUS | Status: AC
  Administered 2012-05-19 (×2): 1 g via INTRAVENOUS
  Filled 2012-05-18 (×2): qty 50

## 2012-05-18 MED ORDER — ACETAMINOPHEN 325 MG PO TABS
650.0000 mg | ORAL_TABLET | Freq: Four times a day (QID) | ORAL | Status: DC | PRN
  Administered 2012-05-19 – 2012-05-21 (×4): 650 mg via ORAL
  Filled 2012-05-18 (×3): qty 2

## 2012-05-18 MED ORDER — B COMPLEX-C PO TABS
1.0000 | ORAL_TABLET | Freq: Every day | ORAL | Status: DC
  Administered 2012-05-18 – 2012-05-23 (×6): 1 via ORAL
  Filled 2012-05-18 (×6): qty 1

## 2012-05-18 MED ORDER — METHOCARBAMOL 100 MG/ML IJ SOLN
500.0000 mg | Freq: Once | INTRAVENOUS | Status: DC
  Filled 2012-05-18: qty 5

## 2012-05-18 MED ORDER — DEXAMETHASONE SODIUM PHOSPHATE 4 MG/ML IJ SOLN
INTRAMUSCULAR | Status: DC | PRN
  Administered 2012-05-18: 5 mg via INTRAVENOUS

## 2012-05-18 MED ORDER — LACTATED RINGERS IV SOLN
INTRAVENOUS | Status: DC | PRN
  Administered 2012-05-18 (×3): via INTRAVENOUS

## 2012-05-18 MED ORDER — FENTANYL CITRATE 0.05 MG/ML IJ SOLN
INTRAMUSCULAR | Status: AC
  Filled 2012-05-18: qty 2

## 2012-05-18 MED ORDER — PHENOL 1.4 % MT LIQD
1.0000 | OROMUCOSAL | Status: DC | PRN
  Administered 2012-05-19: 1 via OROMUCOSAL
  Filled 2012-05-18 (×2): qty 177

## 2012-05-18 MED ORDER — OXYCODONE HCL 5 MG PO TABS
ORAL_TABLET | ORAL | Status: AC
  Filled 2012-05-18: qty 2

## 2012-05-18 MED ORDER — ONDANSETRON HCL 4 MG/2ML IJ SOLN
4.0000 mg | Freq: Four times a day (QID) | INTRAMUSCULAR | Status: DC | PRN
  Administered 2012-05-19 – 2012-05-20 (×2): 4 mg via INTRAVENOUS
  Filled 2012-05-18 (×2): qty 2

## 2012-05-18 MED ORDER — MORPHINE SULFATE 2 MG/ML IJ SOLN: 1.0000 mg | INTRAMUSCULAR | Status: DC | PRN

## 2012-05-18 MED ORDER — LIDOCAINE HCL (CARDIAC) 20 MG/ML IV SOLN
INTRAVENOUS | Status: DC | PRN
  Administered 2012-05-18: 20 mg via INTRAVENOUS

## 2012-05-18 MED ORDER — FENTANYL CITRATE 0.05 MG/ML IJ SOLN
INTRAMUSCULAR | Status: DC | PRN
  Administered 2012-05-18: 25 ug via INTRAVENOUS
  Administered 2012-05-18: 50 ug via INTRAVENOUS
  Administered 2012-05-18: 25 ug via INTRAVENOUS
  Administered 2012-05-18 (×2): 50 ug via INTRAVENOUS
  Administered 2012-05-18 (×2): 25 ug via INTRAVENOUS
  Administered 2012-05-18 (×2): 50 ug via INTRAVENOUS

## 2012-05-18 MED ORDER — DOCUSATE SODIUM 100 MG PO CAPS
100.0000 mg | ORAL_CAPSULE | Freq: Two times a day (BID) | ORAL | Status: DC
  Administered 2012-05-18 – 2012-05-23 (×10): 100 mg via ORAL
  Filled 2012-05-18 (×12): qty 1

## 2012-05-18 MED ORDER — LACTATED RINGERS IV SOLN: INTRAVENOUS | Status: DC

## 2012-05-18 MED ORDER — PROMETHAZINE HCL 25 MG/ML IJ SOLN: 6.2500 mg | INTRAMUSCULAR | Status: DC | PRN

## 2012-05-18 MED ORDER — VITAMIN C 500 MG PO TABS
500.0000 mg | ORAL_TABLET | Freq: Two times a day (BID) | ORAL | Status: DC
  Administered 2012-05-18 – 2012-05-23 (×10): 500 mg via ORAL
  Filled 2012-05-18 (×13): qty 1

## 2012-05-18 MED ORDER — PROPOFOL 10 MG/ML IV BOLUS
INTRAVENOUS | Status: DC | PRN
  Administered 2012-05-18: 120 mg via INTRAVENOUS

## 2012-05-18 MED ORDER — KCL IN DEXTROSE-NACL 20-5-0.45 MEQ/L-%-% IV SOLN
INTRAVENOUS | Status: DC
  Administered 2012-05-18: 20:00:00 via INTRAVENOUS
  Administered 2012-05-19: 75 mL via INTRAVENOUS
  Administered 2012-05-19: 10:00:00 via INTRAVENOUS
  Filled 2012-05-18 (×4): qty 1000

## 2012-05-18 MED ORDER — 0.9 % SODIUM CHLORIDE (POUR BTL) OPTIME
TOPICAL | Status: DC | PRN
  Administered 2012-05-18: 1000 mL

## 2012-05-18 MED ORDER — ACETAMINOPHEN 650 MG RE SUPP: 650.0000 mg | Freq: Four times a day (QID) | RECTAL | Status: DC | PRN

## 2012-05-18 MED ORDER — EPHEDRINE SULFATE 50 MG/ML IJ SOLN
INTRAMUSCULAR | Status: DC | PRN
  Administered 2012-05-18: 10 mg via INTRAVENOUS

## 2012-05-18 MED ORDER — SODIUM CHLORIDE 0.9 % IR SOLN
Status: DC | PRN
  Administered 2012-05-18: 3000 mL

## 2012-05-18 MED ORDER — MORPHINE SULFATE 2 MG/ML IJ SOLN
INTRAMUSCULAR | Status: AC
  Filled 2012-05-18: qty 1

## 2012-05-18 MED ORDER — HYDROMORPHONE HCL PF 1 MG/ML IJ SOLN
INTRAMUSCULAR | Status: AC
  Filled 2012-05-18: qty 1

## 2012-05-18 MED ORDER — FENTANYL CITRATE 0.05 MG/ML IJ SOLN
INTRAMUSCULAR | Status: AC
Start: 2012-05-18 — End: 2012-05-18
  Administered 2012-05-18: 100 ug
  Filled 2012-05-18: qty 2

## 2012-05-18 MED ORDER — ACETAMINOPHEN 500 MG PO TABS: 500.0000 mg | ORAL_TABLET | Freq: Four times a day (QID) | ORAL | Status: DC | PRN

## 2012-05-18 MED ORDER — METHOCARBAMOL 100 MG/ML IJ SOLN
500.0000 mg | Freq: Four times a day (QID) | INTRAVENOUS | Status: DC | PRN
  Administered 2012-05-18: 500 mg via INTRAVENOUS
  Filled 2012-05-18: qty 5

## 2012-05-18 MED ORDER — OXYCODONE HCL 5 MG PO TABS
5.0000 mg | ORAL_TABLET | ORAL | Status: DC | PRN
  Administered 2012-05-18 – 2012-05-21 (×10): 10 mg via ORAL
  Administered 2012-05-21 (×3): 5 mg via ORAL
  Administered 2012-05-22 – 2012-05-23 (×7): 10 mg via ORAL
  Filled 2012-05-18 (×4): qty 2
  Filled 2012-05-18 (×2): qty 1
  Filled 2012-05-18: qty 2
  Filled 2012-05-18: qty 1
  Filled 2012-05-18 (×8): qty 2
  Filled 2012-05-18 (×2): qty 1
  Filled 2012-05-18 (×2): qty 2

## 2012-05-18 MED ORDER — MENTHOL 3 MG MT LOZG: 1.0000 | LOZENGE | OROMUCOSAL | Status: DC | PRN

## 2012-05-18 SURGICAL SUPPLY — 66 items
BANDAGE ELASTIC 4 VELCRO ST LF (GAUZE/BANDAGES/DRESSINGS) ×2 IMPLANT
BANDAGE ELASTIC 6 VELCRO ST LF (GAUZE/BANDAGES/DRESSINGS) ×2 IMPLANT
BANDAGE ESMARK 6X9 LF (GAUZE/BANDAGES/DRESSINGS) ×1 IMPLANT
BENZOIN TINCTURE PRP APPL 2/3 (GAUZE/BANDAGES/DRESSINGS) ×2 IMPLANT
BLADE SAGITTAL 25.0X1.19X90 (BLADE) ×2 IMPLANT
BLADE SAW SGTL 13X75X1.27 (BLADE) ×2 IMPLANT
BNDG ELASTIC 6X10 VLCR STRL LF (GAUZE/BANDAGES/DRESSINGS) ×2 IMPLANT
BNDG ESMARK 6X9 LF (GAUZE/BANDAGES/DRESSINGS) ×2
BOWL SMART MIX CTS (DISPOSABLE) ×2 IMPLANT
CEMENT HV SMART SET (Cement) ×4 IMPLANT
CLOTH BEACON ORANGE TIMEOUT ST (SAFETY) ×2 IMPLANT
CLSR STERI-STRIP ANTIMIC 1/2X4 (GAUZE/BANDAGES/DRESSINGS) ×2 IMPLANT
COVER BACK TABLE 24X17X13 BIG (DRAPES) IMPLANT
COVER SURGICAL LIGHT HANDLE (MISCELLANEOUS) ×2 IMPLANT
CUFF TOURNIQUET SINGLE 34IN LL (TOURNIQUET CUFF) ×2 IMPLANT
CUFF TOURNIQUET SINGLE 44IN (TOURNIQUET CUFF) IMPLANT
DRAPE ORTHO SPLIT 77X108 STRL (DRAPES) ×2
DRAPE SURG ORHT 6 SPLT 77X108 (DRAPES) ×2 IMPLANT
DRAPE U-SHAPE 47X51 STRL (DRAPES) ×2 IMPLANT
DRSG PAD ABDOMINAL 8X10 ST (GAUZE/BANDAGES/DRESSINGS) ×2 IMPLANT
DURAPREP 26ML APPLICATOR (WOUND CARE) ×2 IMPLANT
ELECT REM PT RETURN 9FT ADLT (ELECTROSURGICAL) ×2
ELECTRODE REM PT RTRN 9FT ADLT (ELECTROSURGICAL) ×1 IMPLANT
FACESHIELD LNG OPTICON STERILE (SAFETY) ×2 IMPLANT
GAUZE XEROFORM 5X9 LF (GAUZE/BANDAGES/DRESSINGS) ×2 IMPLANT
GLOVE BIOGEL PI IND STRL 7.5 (GLOVE) ×1 IMPLANT
GLOVE BIOGEL PI IND STRL 8 (GLOVE) ×2 IMPLANT
GLOVE BIOGEL PI INDICATOR 7.5 (GLOVE) ×1
GLOVE BIOGEL PI INDICATOR 8 (GLOVE) ×2
GLOVE ECLIPSE 7.0 STRL STRAW (GLOVE) ×2 IMPLANT
GLOVE ORTHO TXT STRL SZ7.5 (GLOVE) ×4 IMPLANT
GOWN PREVENTION PLUS LG XLONG (DISPOSABLE) ×2 IMPLANT
GOWN PREVENTION PLUS XLARGE (GOWN DISPOSABLE) ×2 IMPLANT
GOWN STRL NON-REIN LRG LVL3 (GOWN DISPOSABLE) ×2 IMPLANT
HANDPIECE INTERPULSE COAX TIP (DISPOSABLE) ×1
IMMOBILIZER KNEE 22 UNIV (SOFTGOODS) ×2 IMPLANT
KIT BASIN OR (CUSTOM PROCEDURE TRAY) ×2 IMPLANT
KIT ROOM TURNOVER OR (KITS) ×2 IMPLANT
MANIFOLD NEPTUNE II (INSTRUMENTS) ×2 IMPLANT
MARKER SPHERE PSV REFLC THRD 5 (MARKER) ×6 IMPLANT
NEEDLE 1/2 CIR MAYO (NEEDLE) ×2 IMPLANT
NEEDLE HYPO 25GX1X1/2 BEV (NEEDLE) ×2 IMPLANT
NS IRRIG 1000ML POUR BTL (IV SOLUTION) ×2 IMPLANT
PACK TOTAL JOINT (CUSTOM PROCEDURE TRAY) ×2 IMPLANT
PAD ARMBOARD 7.5X6 YLW CONV (MISCELLANEOUS) ×4 IMPLANT
PAD CAST 4YDX4 CTTN HI CHSV (CAST SUPPLIES) ×1 IMPLANT
PADDING CAST COTTON 4X4 STRL (CAST SUPPLIES) ×1
PADDING CAST COTTON 6X4 STRL (CAST SUPPLIES) ×2 IMPLANT
PIN SCHANZ 4MM 130MM (PIN) ×8 IMPLANT
SET HNDPC FAN SPRY TIP SCT (DISPOSABLE) ×1 IMPLANT
SPONGE GAUZE 4X4 12PLY (GAUZE/BANDAGES/DRESSINGS) ×2 IMPLANT
STAPLER VISISTAT 35W (STAPLE) ×2 IMPLANT
STRIP CLOSURE SKIN 1/2X4 (GAUZE/BANDAGES/DRESSINGS) ×4 IMPLANT
SUCTION FRAZIER TIP 10 FR DISP (SUCTIONS) ×2 IMPLANT
SUT ETHIBOND NAB CT1 #1 30IN (SUTURE) ×2 IMPLANT
SUT VIC AB 0 CT1 27 (SUTURE) ×1
SUT VIC AB 0 CT1 27XBRD ANBCTR (SUTURE) ×1 IMPLANT
SUT VIC AB 2-0 CT1 27 (SUTURE) ×2
SUT VIC AB 2-0 CT1 TAPERPNT 27 (SUTURE) ×2 IMPLANT
SUT VICRYL 4-0 PS2 18IN ABS (SUTURE) ×4 IMPLANT
SUT VICRYL AB 2 0 TIES (SUTURE) ×2 IMPLANT
SYR CONTROL 10ML LL (SYRINGE) ×2 IMPLANT
TOWEL OR 17X24 6PK STRL BLUE (TOWEL DISPOSABLE) ×2 IMPLANT
TOWEL OR 17X26 10 PK STRL BLUE (TOWEL DISPOSABLE) ×2 IMPLANT
TRAY FOLEY CATH 14FR (SET/KITS/TRAYS/PACK) ×2 IMPLANT
WATER STERILE IRR 1000ML POUR (IV SOLUTION) ×6 IMPLANT

## 2012-05-18 NOTE — Anesthesia Postprocedure Evaluation (Signed)
  Anesthesia Post-op Note  Patient: Heidi Moore  Procedure(s) Performed: Procedure(s) (LRB) with comments: COMPUTER ASSISTED TOTAL KNEE ARTHROPLASTY (Left) - Left Total Knee Arthroplasty, Cemented, Computer Assisted  Patient Location: PACU  Anesthesia Type:GA combined with regional for post-op pain  Level of Consciousness: awake, oriented, sedated and patient cooperative  Airway and Oxygen Therapy: Patient Spontanous Breathing  Post-op Pain: mild  Post-op Assessment: Post-op Vital signs reviewed, Patient's Cardiovascular Status Stable, Respiratory Function Stable, Patent Airway, No signs of Nausea or vomiting and Pain level controlled  Post-op Vital Signs: stable  Complications: No apparent anesthesia complications

## 2012-05-18 NOTE — Progress Notes (Signed)
Orthopedic Tech Progress Note Patient Details:  Heidi Moore [birth year] [id number]  CPM Left Knee CPM Left Knee: On Left Knee Flexion (Degrees): 60  Left Knee Extension (Degrees): 0  Additional Comments: ohf will be provided when one becomes available;rn notified   [name] 05/18/2012, 4:25 PM

## 2012-05-18 NOTE — Interval H&P Note (Signed)
History and Physical Interval Note:  05/18/2012 12:51 PM  Heidi Moore  has presented today for surgery, with the diagnosis of Left Knee Osteoarthritis   The various methods of treatment have been discussed with the patient and family. After consideration of risks, benefits and other options for treatment, the patient has consented to  Procedure(s) (LRB) with comments: COMPUTER ASSISTED TOTAL KNEE ARTHROPLASTY (Left) - Left Total Knee Arthroplasty, Cemented, Computer Assist as a surgical intervention .  The patient's history has been reviewed, patient examined, no change in status, stable for surgery.  I have reviewed the patient's chart and labs.  Questions were answered to the patient's satisfaction.     [name] C

## 2012-05-18 NOTE — Brief Op Note (Cosign Needed)
05/18/2012  3:42 PM  PATIENT:  Heidi Moore  79 y.o. female  PRE-OPERATIVE DIAGNOSIS:  Left Knee Osteoarthritis   POST-OPERATIVE DIAGNOSIS:  Left Knee Osteoarthritis   PROCEDURE:  Procedure(s) (LRB) with comments: COMPUTER ASSISTED TOTAL KNEE ARTHROPLASTY (Left) - Left Total Knee Arthroplasty, Cemented, Computer Assisted  SURGEON:  Surgeon(s) and Role:    * Eldred Manges, MD - Primary  PHYSICIAN ASSISTANT: Maud Deed [name]  ASSISTANTS: none   ANESTHESIA:   general  EBL:  Total I/O In: 2000 [I.V.:2000] Out: 275 [Urine:225; Blood:50]  BLOOD ADMINISTERED:none  DRAINS: none   LOCAL MEDICATIONS USED:  NONE  SPECIMEN:  No Specimen  DISPOSITION OF SPECIMEN:  N/A  COUNTS:  YES  TOURNIQUET:   Total Tourniquet Time Documented: Thigh (Left) - 55 minutes  DICTATION: .Note written in EPIC  PLAN OF CARE: Admit to inpatient   PATIENT DISPOSITION:  PACU - hemodynamically stable.   Delay start of Pharmacological VTE agent (>24hrs) due to surgical blood loss or risk of bleeding: no

## 2012-05-18 NOTE — Anesthesia Preprocedure Evaluation (Addendum)
Anesthesia Evaluation  Patient identified by MRN, date of birth, ID band Patient awake    Reviewed: Allergy & Precautions, H&P , NPO status , Patient's Chart, lab work & pertinent test results  History of Anesthesia Complications Negative for: history of anesthetic complications  Airway Mallampati: II  Neck ROM: Full  Mouth opening: Limited Mouth Opening  Dental  (+) Teeth Intact   Pulmonary neg pulmonary ROS,    Pulmonary exam normal       Cardiovascular + Peripheral Vascular Disease     Neuro/Psych negative neurological ROS     GI/Hepatic negative GI ROS, Neg liver ROS,   Endo/Other  negative endocrine ROS  Renal/GU negative Renal ROS     Musculoskeletal   Abdominal   Peds  Hematology   Anesthesia Other Findings   Reproductive/Obstetrics                           Anesthesia Physical Anesthesia Plan  ASA: II  Anesthesia Plan:    Post-op Pain Management:    Induction: Intravenous  Airway Management Planned: LMA  Additional Equipment:   Intra-op Plan:   Post-operative Plan:   Informed Consent: I have reviewed the patients History and Physical, chart, labs and discussed the procedure including the risks, benefits and alternatives for the proposed anesthesia with the patient or authorized representative who has indicated his/her understanding and acceptance.   Dental advisory given  Plan Discussed with: CRNA, Anesthesiologist and Surgeon  Anesthesia Plan Comments:         Anesthesia Quick Evaluation

## 2012-05-18 NOTE — Preoperative (Signed)
Beta Blockers   Reason not to administer Beta Blockers:Not Applicable 

## 2012-05-18 NOTE — Anesthesia Procedure Notes (Addendum)
Anesthesia Regional Block:  Femoral nerve block  Pre-Anesthetic Checklist: ,, timeout performed, Correct Patient, Correct Site, Correct Laterality, Correct Procedure, Correct Position, site marked, Risks and benefits discussed,  Surgical consent,  Pre-op evaluation,  At surgeon's request and post-op pain management  Laterality: Left  Prep: chloraprep       Needles:  Injection technique: Single-shot  Needle Type: Echogenic Stimulator Needle     Needle Length: 5cm 5 cm Needle Gauge: 22 and 22 G    Additional Needles:  Procedures: ultrasound guided (picture in chart) and nerve stimulator Femoral nerve block  Nerve Stimulator or Paresthesia:  Response: quadraceps contraction, 0.45 mA,   Additional Responses:   Narrative:  Start time: 05/18/2012 1:06 PM End time: 05/18/2012 1:16 PM Injection made incrementally with aspirations every 5 mL.  Performed by: Personally  Anesthesiologist: Halford Decamp, MD  Additional Notes: Functioning IV was confirmed and monitors were applied.  A 50mm 22ga Arrow echogenic stimulator needle was used. Sterile prep and drape,hand hygiene and sterile gloves were used. Ultrasound guidance: relevant anatomy identified, needle position confirmed, local anesthetic spread visualized around nerve(s)., vascular puncture avoided.  Image printed for medical record. Negative aspiration and negative test dose prior to incremental administration of local anesthetic. The patient tolerated the procedure well.    Femoral nerve block Procedure Name: Intubation Date/Time: 05/18/2012 1:40 PM Performed by: Romie Minus K Pre-anesthesia Checklist: Patient identified, Emergency Drugs available, Suction available, Patient being monitored and Timeout performed Patient Re-evaluated:Patient Re-evaluated prior to inductionOxygen Delivery Method: Circle system utilized Preoxygenation: Pre-oxygenation with 100% oxygen Intubation Type: IV induction Ventilation: Mask  ventilation without difficulty LMA: LMA inserted LMA Size: 3.0 Number of attempts: 1 Placement Confirmation: positive ETCO2,  CO2 detector and breath sounds checked- equal and bilateral Tube secured with: Tape Dental Injury: Teeth and Oropharynx as per pre-operative assessment

## 2012-05-18 NOTE — Transfer of Care (Signed)
Immediate Anesthesia Transfer of Care Note  Patient: Heidi Moore  Procedure(s) Performed: Procedure(s) (LRB) with comments: COMPUTER ASSISTED TOTAL KNEE ARTHROPLASTY (Left) - Left Total Knee Arthroplasty, Cemented, Computer Assisted  Patient Location: PACU  Anesthesia Type:General  Level of Consciousness: awake, alert  and oriented  Airway & Oxygen Therapy: Patient Spontanous Breathing and Patient connected to face mask oxygen  Post-op Assessment: Report given to PACU RN, Post -op Vital signs reviewed and stable and Patient moving all extremities  Post vital signs: Reviewed and stable  Complications: No apparent anesthesia complications

## 2012-05-19 ENCOUNTER — Encounter (HOSPITAL_COMMUNITY): Payer: Self-pay | Admitting: Orthopaedic Surgery

## 2012-05-19 LAB — BASIC METABOLIC PANEL
BUN: 9 mg/dL (ref 6–23)
Calcium: 9.1 mg/dL (ref 8.4–10.5)
Chloride: 102 mEq/L (ref 96–112)
Creatinine, Ser: 0.72 mg/dL (ref 0.50–1.10)
GFR calc Af Amer: 85 mL/min — ABNORMAL LOW (ref >90)
GFR calc non Af Amer: 73 mL/min — ABNORMAL LOW (ref >90)

## 2012-05-19 LAB — CBC
HCT: 32.7 % — ABNORMAL LOW (ref 36.0–46.0)
MCH: 30.2 pg (ref 26.0–34.0)
MCHC: 33 g/dL (ref 30.0–36.0)
MCV: 91.3 fL (ref 78.0–100.0)
Platelets: 210 10*3/uL (ref 150–400)
RDW: 13.9 % (ref 11.5–15.5)
WBC: 17.3 10*3/uL — ABNORMAL HIGH (ref 4.0–10.5)

## 2012-05-19 NOTE — Progress Notes (Signed)
Subjective: 1 Day Post-Op Procedure(s) (LRB): COMPUTER ASSISTED TOTAL KNEE ARTHROPLASTY (Left) Patient reports pain as moderate. Feels the oxycodone not strong enough through the night.  Has some at 7am and better control but asking to have it more often than 4 hrs.  Will allow q3 hrs however will need to be cautious of mental status or respiratory changes.   Objective: Vital signs in last 24 hours: Temp:  [97.3 F (36.3 C)-98.9 F (37.2 C)] 97.9 F (36.6 C) (01/09 0634) Pulse Rate:  [80-101] 80  (01/09 0634) Resp:  [16-20] 18  (01/09 0634) BP: (108-138)/(51-81) 124/60 mmHg (01/09 0634) SpO2:  [96 %-100 %] 97 % (01/09 0634) Weight:  [57.1 kg (125 lb 14.1 oz)] 57.1 kg (125 lb 14.1 oz) (01/08 1719)  Intake/Output from previous day: 01/08 0701 - 01/09 0700 In: 2150 [I.V.:2100] Out: 1175 [Urine:1125; Blood:50] Intake/Output this shift:     Basename 05/19/12 0525  HGB 10.8*    Basename 05/19/12 0525  WBC 17.3*  RBC 3.58*  HCT 32.7*  PLT 210    Basename 05/19/12 0525  NA 135  K 3.9  CL 102  CO2 26  BUN 9  CREATININE 0.72  GLUCOSE 124*  CALCIUM 9.1   No results found for this basename: LABPT:2,INR:2 in the last 72 hours  Neurovascular intact Intact pulses distally Dorsiflexion/Plantar flexion intact Incision: no drainage  Assessment/Plan: 1 Day Post-Op Procedure(s) (LRB): COMPUTER ASSISTED TOTAL KNEE ARTHROPLASTY (Left) Up with therapy Disp: depending on progress  Heidi Moore 05/19/2012, 9:29 AM

## 2012-05-19 NOTE — Progress Notes (Signed)
Physical Therapy Treatment Patient Details Name: Heidi Moore MRN: [id number] DOB: 17-Jun-1919 Today's Date: 05/19/2012 Time: 1230-1306 PT Time Calculation (min): 36 min  PT Assessment / Plan / Recommendation Comments on Treatment Session  Pt anxious to walk this afternoon, but was limited by too much pain in weight bearing.  She did work on static standing and weight shift to enourage weight bearing on LLE.  Performed AAROM. Pt needs more work with quadriceps to increase activation. Pitting edema noted in left leg.   Anticipate pt wiill need  at least 2-3 more days of PT prior to d/c to home    Follow Up Recommendations  Home health PT;Supervision/Assistance - 24 hour     Does the patient have the potential to tolerate intense rehabilitation     Barriers to Discharge        Equipment Recommendations       Recommendations for Other Services    Frequency 7X/week   Plan Discharge plan remains appropriate;Frequency remains appropriate    Precautions / Restrictions Precautions Precautions: Knee Precaution Comments: No pillow under left knee. Required Braces or Orthoses: Knee Immobilizer - Left Knee Immobilizer - Left: Discontinue once straight leg raise with < 10 degree lag Restrictions Weight Bearing Restrictions: Yes LLE Weight Bearing: Weight bearing as tolerated   Pertinent Vitals/Pain Pain 8/10 in left knee with activity    Mobility  Bed Mobility Bed Mobility: Supine to Sit;Sit to Supine Supine to Sit: 4: Min assist Details for Bed Mobility Assistance: assist to lift leg up onto bed Transfers Transfers: Sit to Stand;Stand to Sit Sit to Stand: 4: Min assist;With upper extremity assist Stand to Sit: 4: Min assist;Without upper extremity assist Details for Transfer Assistance: Pt improving with sit to stand with each practice Ambulation/Gait Ambulation/Gait Assistance: 3: Mod assist Ambulation Distance (Feet): 3 Feet Assistive device: Rolling walker Ambulation/Gait  Assistance Details: assist for balance and to advance body.  Too much pain with weight bearing on LLE Gait Pattern: Step-to pattern Gait velocity: decreased General Gait Details: Pt unable to advance walking due to too much pain in LLE, Backed down to step and weight shift onto LLE, still with much pain.  Finally just worked on static standing with feet shoulder width apart to improve weight shift to left and trunk posture Pt needed frequent sitting rest breaks due to pain and  fatigue Stairs: No Wheelchair Mobility Wheelchair Mobility: No    Exercises General Exercises - Lower Extremity Ankle Circles/Pumps: AROM;Both;5 reps;Supine Quad Sets: AROM;Right;Supine;5 reps;Other (comment) (unable to perform quad set on left) Short Arc Quad: AAROM;Left;5 reps;Supine Long Arc Quad: Seated;10 reps;Left;AAROM (multiple angle isometrics to encourage quad activity) Heel Slides: AAROM;Left;5 reps;Supine Other Exercises Other Exercises: deep breathing   PT Diagnosis: Difficulty walking;Acute pain  PT Problem List: Decreased strength;Decreased range of motion;Decreased activity tolerance;Decreased knowledge of use of DME PT Treatment Interventions: DME instruction;Gait training;Stair training;Functional mobility training;Therapeutic activities;Therapeutic exercise   PT Goals Acute Rehab PT Goals PT Goal Formulation: With patient Time For Goal Achievement: 05/24/12 Potential to Achieve Goals: Good Pt will go Supine/Side to Sit: with min assist PT Goal: Supine/Side to Sit - Progress: Goal set today Pt will go Sit to Supine/Side: with min assist PT Goal: Sit to Supine/Side - Progress: Progressing toward goal Pt will go Sit to Stand: with supervision PT Goal: Sit to Stand - Progress: Updated due to goal met Pt will Ambulate: 51 - 150 feet;with min assist;with least restrictive assistive device PT Goal: Ambulate - Progress: Progressing toward  goal Pt will Go Up / Down Stairs: 3-5 stairs;with min  assist PT Goal: Up/Down Stairs - Progress: Goal set today Pt will Perform Home Exercise Program: with min assist PT Goal: Perform Home Exercise Program - Progress: Goal set today  Visit Information  Last PT Received On: 05/19/12 Assistance Needed: +1    Subjective Data  Subjective: Pt with some c/o nausea with pain Patient Stated Goal: to walk   Cognition  Overall Cognitive Status: Appears within functional limits for tasks assessed/performed Arousal/Alertness: Awake/alert Orientation Level: Appears intact for tasks assessed Behavior During Session: [facility] for tasks performed Cognition - Other Comments: pt moves quickly, needs occasional redirection to attend to task    Balance  Balance Balance Assessed: Yes Static Sitting Balance Static Sitting - Balance Support: Bilateral upper extremity supported Static Sitting - Level of Assistance: 5: Stand by assistance Static Standing Balance Static Standing - Balance Support: Bilateral upper extremity supported;During functional activity Static Standing - Level of Assistance: 4: Min assist Static Standing - Comment/# of Minutes: ~3 .  pt began to feel nauseous  End of Session PT - End of Session Equipment Utilized During Treatment: Left knee immobilizer Activity Tolerance: Patient limited by pain;Patient limited by fatigue Patient left: in bed;with family/visitor present Nurse Communication: Mobility status   GP     Bayard Hugger. Manson Passey, PT [phone] 05/19/2012, 1:22 PM

## 2012-05-19 NOTE — Op Note (Signed)
Heidi Moore, Heidi Moore                 ACCOUNT NO.:  [id number]  MEDICAL RECORD NO.:  [id number]  LOCATION:  [id number]                        FACILITY:  [facility]  PHYSICIAN:  Markee Remlinger C. Heidi Moore, [name]    DATE OF BIRTH:  Jul 30, 1919  DATE OF PROCEDURE:  05/18/2012 DATE OF DISCHARGE:                              OPERATIVE REPORT   PREOPERATIVE DIAGNOSIS:  Left knee osteoarthritis.  POSTOPERATIVE DIAGNOSIS:  Left knee osteoarthritis.  PROCEDURE:  Left total knee arthroplasty cemented.  SURGEON:  Heidi Artiga C. Heidi Charter, MD  ASSISTANT:  Wende Neighbors, PA medically necessary and present for the entire procedure.   ANESTHESIA:  GOT with preoperative femoral nerve block.  DRAINS:  None.  COMPLICATIONS:  None.  TOURNIQUET TIME:  54 minutes x350 pressure.  COMPONENTS IMPLANTED: 1. DePuy 2.5 femur. 2. Tibia, 10 mm, poly rotating platform, cruciate substituting, 3     pegged all poly patella.  DESCRIPTION OF PROCEDURE:  After induction of general anesthesia, lateral post heel bump, proximal thigh tourniquet, and Foley catheter placement, prepping and draping from the tip of the toes.  Tourniquet was performed with impervious stockinette, Coban, and total knee split sheets drapes, sterile skin marker and Betadine, Steri-Drape, sealing the skin.  Time-out procedure was completed.  A 2 g Ancef was given prophylactically.  Leg was wrapped in Esmarch, tourniquet deflated.  Midline incision was made.  Deep retinaculum was divided with the quad tendon between the medial 1/3rd and lateral 2/3rd.  Patella was everted, cut 10 mm off the patella __________.  There were large erosive changes in the lateral femoral condyle with a quarter sized divot that extended down 3-4 mm which was mobile.  There was erosive divot off the anterior weightbearing portion of the medial condyle as well with grade 3 changes on the tibial plateau and grade 2 changes on the patella.  Large fragment was removed and after the pins were  placed, computer modules were made.  A #3 femur was adjusted and originally 9 mm was taken off the femur.  This had to be adjusted, taken 2 more mm since it did not go down to the depth of the osteochondral fragment which was large on the lateral condyle.  Two more millimeters were taken.  This cut down to the base.  It was verified.  Chamfer cuts were made.  Box cut was not made until the tibia was cut 0 degrees.  Initial leg alignment showed 1 degree of varus and 2 degrees to 3 degrees flexion contracture.  An 8 mm taken off the tibia.  Posterior spurs removed off the femur with 3 cortical curved osteotome.  ACL, PCL have been resected.  All meniscal remnants were removed.  Keel preparation, box cut made on the femur and then trial was inserted with 10-mm bearing.  This gave full extension and flexion and extension gaps were 22.5 and 23.5 for medial and lateral respectfully in both flexion and extension with good balance.  Pulsatile lavage was used followed by cement vacuum mix.  The tibia was cemented first followed by the femur and then insertion of the poly after removal of all excessive cement.  Patella was prepared, clamped on tightly.  All excess cement have been removed.  Pulse lavage was used and the cement was hardened for 14 minutes.  Tourniquet was deflated.  Hemostasis was obtained.  Stab incisions for the bicortical pins in the tibia were closed with 3-0 Vicryl.  Standard layered closure with deep retinaculum, superficial and skin subcuticular closure was used.  The patient tolerated the procedure well.  Postop dressing and knee immobilizer was applied.     Heidi Moore, [name]     MCY/MEDQ  D:  05/18/2012  T:  05/19/2012  Job:  [id number]

## 2012-05-19 NOTE — Progress Notes (Signed)
UR COMPLETED  

## 2012-05-19 NOTE — Evaluation (Signed)
Physical Therapy Evaluation Patient Details Name: Heidi Moore MRN: [id number] DOB: [birth year] Today's Date: 05/19/2012 Time: 1191-4782 PT Time Calculation (min): 21 min  PT Assessment / Plan / Recommendation Clinical Impression  79  yo female s/p TKR who is doing well with initial mobilty.  She is having some difficulty  active contraction of quads and ROM Will focus efforts on active movement of LLE and progress mobility and gait as pt wants to d/c to home with HHPT and familty support.     PT Assessment  Patient needs continued PT services    Follow Up Recommendations  Home health PT;Supervision/Assistance - 24 hour    Does the patient have the potential to tolerate intense rehabilitation      Barriers to Discharge        Equipment Recommendations       Recommendations for Other Services     Frequency 7X/week    Precautions / Restrictions Precautions Precautions: Knee Precaution Comments: No pillow under left knee. Required Braces or Orthoses: Knee Immobilizer - Left Knee Immobilizer - Left: Discontinue once straight leg raise with < 10 degree lag Restrictions Weight Bearing Restrictions: Yes LLE Weight Bearing: Weight bearing as tolerated   Pertinent Vitals/Pain Pt with c/o pain in knee with mobility      Mobility  Bed Mobility Bed Mobility: Supine to Sit Supine to Sit: 4: Min assist Details for Bed Mobility Assistance: pt needs cues to bing leg up to bridge to assist with scooting to edge of bed and assist to bring LLE to edge of bed Transfers Transfers: Sit to Stand;Stand to Sit Sit to Stand: 3: Mod assist Stand to Sit: 4: Min assist Details for Transfer Assistance: Cues to push up with arms to stand.  PT unable to fullly extend LLE or weight bear due to pain, even with KI assist Ambulation/Gait Ambulation/Gait Assistance: 3: Mod assist Ambulation Distance (Feet): 3 Feet Assistive device: Rolling walker Ambulation/Gait Assistance Details: Assit to advance  body due to decreased ability to bear weight on LLE Gait Pattern: Step-to pattern Gait velocity: decreased General Gait Details: Pt had some difficulty with weight bearing and advancing LLE  Stairs: No Wheelchair Mobility Wheelchair Mobility: No    Shoulder Instructions     Exercises General Exercises - Lower Extremity Ankle Circles/Pumps: AROM;5 reps;Supine;Both Quad Sets: AROM;Right;Supine;5 reps;Other (comment) (unable to perform quad set on left) Short Arc Quad: AAROM;Left;5 reps;Supine Heel Slides: AAROM;Left;5 reps;Supine   PT Diagnosis: Difficulty walking;Acute pain  PT Problem List: Decreased strength;Decreased range of motion;Decreased activity tolerance;Decreased knowledge of use of DME PT Treatment Interventions: DME instruction;Gait training;Stair training;Functional mobility training;Therapeutic activities;Therapeutic exercise   PT Goals Acute Rehab PT Goals PT Goal Formulation: With patient Time For Goal Achievement: 05/24/12 Potential to Achieve Goals: Good Pt will go Supine/Side to Sit: with min assist PT Goal: Supine/Side to Sit - Progress: Goal set today Pt will go Sit to Supine/Side: with min assist PT Goal: Sit to Supine/Side - Progress: Goal set today Pt will go Sit to Stand: with min assist PT Goal: Sit to Stand - Progress: Goal set today Pt will Ambulate: 51 - 150 feet;with min assist;with least restrictive assistive device PT Goal: Ambulate - Progress: Goal set today Pt will Go Up / Down Stairs: 3-5 stairs;with min assist PT Goal: Up/Down Stairs - Progress: Goal set today Pt will Perform Home Exercise Program: with min assist PT Goal: Perform Home Exercise Program - Progress: Goal set today  Visit Information  Last PT Received On:  05/19/12 Assistance Needed: +1    Subjective Data  Subjective: '"I've checked with my insurance company" Patient Stated Goal: to go home with daughter's assist   Prior Functioning  Home Living Lives With:  Alone Available Help at Discharge: Family;Available 24 hours/day Type of Home: House Home Access: Stairs to enter [employer] of Steps: 4 Entrance Stairs-Rails: Right Home Layout: One level Bathroom Shower/Tub: Health visitor: Handicapped height Home Adaptive Equipment: Engineer, drilling - rolling;Grab bars in shower Additional Comments: on edaughter lives nearby and plans to assist pt at home Prior Function Level of Independence: Independent Able to Take Stairs?: Yes Driving: Yes Vocation: Retired Musician: No difficulties Dominant Hand: Right    Cognition  Overall Cognitive Status: Appears within functional limits for tasks assessed/performed Arousal/Alertness: Awake/alert Orientation Level: Appears intact for tasks assessed Behavior During Session: [facility] for tasks performed Cognition - Other Comments: pt moves quickly, needs occasional redirection to attend to task    Extremity/Trunk Assessment Right Lower Extremity Assessment RLE ROM/Strength/Tone: Within functional levels Left Lower Extremity Assessment LLE ROM/Strength/Tone: Deficits LLE ROM/Strength/Tone Deficits: pt op TKR.  pt has some difficulty producing quad set. and lack terminal extension ROM.  Needs assist for moving LLE Trunk Assessment Trunk Assessment:  (pt thin)   Balance Balance Balance Assessed: Yes Static Sitting Balance Static Sitting - Balance Support: Bilateral upper extremity supported Static Sitting - Level of Assistance: 5: Stand by assistance Static Standing Balance Static Standing - Balance Support: Bilateral upper extremity supported;During functional activity Static Standing - Level of Assistance: 3: Mod assist  End of Session PT - End of Session Equipment Utilized During Treatment: Left knee immobilizer Activity Tolerance: Patient tolerated treatment well Patient left: in chair;with family/visitor present Nurse Communication: Mobility  status CPM Left Knee CPM Left Knee: On Left Knee Flexion (Degrees): 60  Left Knee Extension (Degrees): 0   GP     Heidi Moore K. Manson Passey, [place] [phone] 05/19/2012, 11:27 AM

## 2012-05-19 NOTE — Evaluation (Signed)
Occupational Therapy Evaluation Patient Details Name: Heidi Moore MRN: [id number] DOB: 1919-11-27 Today's Date: 05/19/2012 Time: 4098-1191 OT Time Calculation (min): 30 min  OT Assessment / Plan / Recommendation Clinical Impression  Pt presents POD 1 LTKR. Skilled OTindicated to maximize independence with BADLs to supervision/min A level in prep for d/c home with HHOT.    OT Assessment  Patient needs continued OT Services    Follow Up Recommendations  Home health OT;Supervision/Assistance - 24 hour    Barriers to Discharge      Equipment Recommendations  3 in 1 bedside comode    Recommendations for Other Services    Frequency  Min 2X/week    Precautions / Restrictions Precautions Precautions: Knee Precaution Comments: No pillow under left knee. Required Braces or Orthoses: Knee Immobilizer - Left Knee Immobilizer - Left: Discontinue once straight leg raise with < 10 degree lag Restrictions Weight Bearing Restrictions: Yes LLE Weight Bearing: Weight bearing as tolerated   Pertinent Vitals/Pain Pt reported 7/10 pain at end of session. Repositioned for comfort and cold applied.    ADL  Grooming: Teeth care;Set up Where Assessed - Grooming: Supported sitting Upper Body Bathing: Set up Where Assessed - Upper Body Bathing: Unsupported sitting Lower Body Bathing: Moderate assistance Where Assessed - Lower Body Bathing: Supported sit to stand Upper Body Dressing: Set up Where Assessed - Upper Body Dressing: Unsupported sitting Lower Body Dressing: Moderate assistance Where Assessed - Lower Body Dressing: Supported sit to Pharmacist, hospital: Moderate assistance Toilet Transfer Method: Stand pivot Toilet Transfer Equipment: Other (comment) (to recliner.) Toileting - Clothing Manipulation and Hygiene: Minimal assistance Where Assessed - Toileting Clothing Manipulation and Hygiene: Standing Equipment Used: Rolling walker;Gait belt Transfers/Ambulation Related to ADLs:  Bed>chair transfer only. Pt with difficulty bearing weight through LLE.     OT Diagnosis: Generalized weakness  OT Problem List: Decreased activity tolerance;Decreased safety awareness;Decreased knowledge of use of DME or AE;Pain OT Treatment Interventions: Self-care/ADL training;Therapeutic activities;DME and/or AE instruction;Patient/family education   OT Goals Acute Rehab OT Goals OT Goal Formulation: With patient/family Time For Goal Achievement: 05/26/12 Potential to Achieve Goals: Good ADL Goals Pt Will Perform Grooming: with supervision;Standing at sink ADL Goal: Grooming - Progress: Goal set today Pt Will Perform Lower Body Bathing: with min assist;Sit to stand from chair;Sit to stand from bed ADL Goal: Lower Body Bathing - Progress: Goal set today Pt Will Perform Lower Body Dressing: with min assist;Sit to stand from chair;Sit to stand from bed ADL Goal: Lower Body Dressing - Progress: Goal set today Pt Will Transfer to Toilet: with supervision;with DME;Ambulation ADL Goal: Toilet Transfer - Progress: Goal set today Pt Will Perform Toileting - Clothing Manipulation: with supervision;Sitting on 3-in-1 or toilet;Standing ADL Goal: Toileting - Clothing Manipulation - Progress: Goal set today Pt Will Perform Toileting - Hygiene: with supervision;Sit to stand from 3-in-1/toilet ADL Goal: Toileting - Hygiene - Progress: Goal set today Pt Will Perform Tub/Shower Transfer: Shower transfer;with min assist;Ambulation ADL Goal: Tub/Shower Transfer - Progress: Goal set today  Visit Information  Last OT Received On: 05/19/12 Assistance Needed: +1 PT/OT Co-Evaluation/Treatment: Yes    Subjective Data  Subjective: I'm going to get therapy at home. Patient Stated Goal: Return home with daughter's assist.   Prior Functioning     Home Living Lives With: Alone Available Help at Discharge: Family;Available 24 hours/day Type of Home: House Home Access: Stairs to enter [employer] of Steps: 4 Entrance Stairs-Rails: Right Home Layout: One level Bathroom Shower/Tub: Heritage manager  Toilet: Handicapped height Home Adaptive Equipment: Engineer, drilling - rolling;Grab bars in shower Additional Comments: on edaughter lives nearby and plans to assist pt at home Prior Function Level of Independence: Independent Able to Take Stairs?: Yes Driving: Yes Vocation: Retired Musician: No difficulties Dominant Hand: Right         Vision/Perception     Cognition  Overall Cognitive Status: Appears within functional limits for tasks assessed/performed Arousal/Alertness: Awake/alert Orientation Level: Appears intact for tasks assessed Behavior During Session: [facility] for tasks performed Cognition - Other Comments: pt moves quickly, needs occasional redirection to attend to task    Extremity/Trunk Assessment Right Upper Extremity Assessment RUE ROM/Strength/Tone: [facility] for tasks assessed Left Upper Extremity Assessment LUE ROM/Strength/Tone: WFL for tasks assessed      Mobility Bed Mobility Bed Mobility: Supine to Sit Supine to Sit: 4: Min assist Details for Bed Mobility Assistance: pt needs cues to bing leg up to bridge to assist with scooting to edge of bed and assist to bring LLE to edge of bed Transfers Sit to Stand: 3: Mod assist Stand to Sit: 4: Min assist Details for Transfer Assistance: Cues to push up with arms to stand.  PT unable to fullly extend LLE or weight bear due to pain, even with KI assist     Shoulder Instructions     Exercise    Balance Balance Balance Assessed: Yes Static Sitting Balance Static Sitting - Balance Support: Bilateral upper extremity supported Static Sitting - Level of Assistance: 5: Stand by assistance Static Standing Balance Static Standing - Balance Support: Bilateral upper extremity supported;During functional activity Static Standing - Level of Assistance: 3: Mod assist     End of Session OT - End of Session Equipment Utilized During Treatment: Gait belt Activity Tolerance: Patient limited by pain Patient left: in chair;with call bell/phone within reach;with family/visitor present  GO     Barnaby Rippeon A OTR/L [phone] 05/19/2012, 12:23 PM

## 2012-05-20 LAB — CBC
HCT: 27.8 % — ABNORMAL LOW (ref 36.0–46.0)
Hemoglobin: 9.3 g/dL — ABNORMAL LOW (ref 12.0–15.0)
MCH: 30.3 pg (ref 26.0–34.0)
MCHC: 33.5 g/dL (ref 30.0–36.0)
MCV: 90.6 fL (ref 78.0–100.0)

## 2012-05-20 MED ORDER — ASPIRIN 325 MG PO TBEC: 325.0000 mg | DELAYED_RELEASE_TABLET | Freq: Every day | ORAL | Status: DC

## 2012-05-20 MED ORDER — OXYCODONE-ACETAMINOPHEN 5-325 MG PO TABS: 1.0000 | ORAL_TABLET | ORAL | Status: DC | PRN

## 2012-05-20 MED ORDER — OXYCODONE-ACETAMINOPHEN 5-325 MG PO TABS: 2.0000 | ORAL_TABLET | Freq: Four times a day (QID) | ORAL | Status: DC | PRN

## 2012-05-20 MED ORDER — METHOCARBAMOL 500 MG PO TABS: 500.0000 mg | ORAL_TABLET | Freq: Four times a day (QID) | ORAL | Status: DC | PRN

## 2012-05-20 NOTE — Progress Notes (Addendum)
Subjective: 2 Days Post-Op Procedure(s) (LRB): COMPUTER ASSISTED TOTAL KNEE ARTHROPLASTY (Left) Patient reports pain as 7 on 0-10 scale.    Objective: Vital signs in last 24 hours: Temp:  [97.9 F (36.6 C)-98.5 F (36.9 C)] 98.4 F (36.9 C) (01/10 0512) Pulse Rate:  [68-117] 74  (01/10 0512) Resp:  [16-18] 16  (01/10 0512) BP: (115-129)/(54-66) 124/66 mmHg (01/10 0512) SpO2:  [94 %-100 %] 95 % (01/10 0512)  Intake/Output from previous day: 01/09 0701 - 01/10 0700 In: 3277.5 [P.O.:720; I.V.:2557.5] Out: 900 [Urine:900] Intake/Output this shift:     Basename 05/20/12 0540 05/19/12 0525  HGB 9.3* 10.8*    Basename 05/20/12 0540 05/19/12 0525  WBC 13.3* 17.3*  RBC 3.07* 3.58*  HCT 27.8* 32.7*  PLT 170 210    Basename 05/19/12 0525  NA 135  K 3.9  CL 102  CO2 26  BUN 9  CREATININE 0.72  GLUCOSE 124*  CALCIUM 9.1   No results found for this basename: LABPT:2,INR:2 in the last 72 hours  Compartment soft  Assessment/Plan: 2 Days Post-Op Procedure(s) (LRB): COMPUTER ASSISTED TOTAL KNEE ARTHROPLASTY (Left) Up with therapy  D/c  IV.   OOB to chair. Possible Home Sunday.  YATES,MARK C 05/20/2012, 10:23 AM   Percocet and robaxin rx on chart.

## 2012-05-20 NOTE — Progress Notes (Signed)
Physical Therapy Treatment Patient Details Name: Heidi Moore MRN: [id number] DOB: 03/27/1920 Today's Date: 05/20/2012 Time: 4098-1191 PT Time Calculation (min): 34 min  PT Assessment / Plan / Recommendation Comments on Treatment Session  Tolerating little more weight on left LE with increased distance today.  Still very antalgic with limited tolerance to weight bearing on left and c/o right shoulder soreness with h/o pain there.  She questions as to I if she might need SNF first, but with improvements may make it home with daughter assist.    Follow Up Recommendations  Home health PT;Supervision/Assistance - 24 hour                 Equipment Recommendations  None recommended by PT        Frequency 7X/week   Plan      Precautions / Restrictions Precautions Precautions: Knee;Fall Required Braces or Orthoses: Knee Immobilizer - Left Knee Immobilizer - Left: Discontinue once straight leg raise with < 10 degree lag Restrictions LLE Weight Bearing: Weight bearing as tolerated   Pertinent Vitals/Pain 8/10 left knee RN delivered meds just prior to treatment    Mobility  Bed Mobility Supine to Sit: 3: Mod assist;HOB elevated;With rails Details for Bed Mobility Assistance: cues for technique, assist for left leg and to lift trunk Transfers Sit to Stand: 3: Mod assist;From bed;With upper extremity assist Stand to Sit: 4: Min assist;With armrests;To chair/3-in-1 Details for Transfer Assistance: increased assist from bed with cues to extend right leg. Ambulation/Gait Ambulation/Gait Assistance: 4: Min assist Ambulation Distance (Feet): 18 Feet Assistive device: Rolling walker Gait Pattern: Step-to pattern;Antalgic;Decreased stance time - left;Decreased step length - right    Exercises Total Joint Exercises Ankle Circles/Pumps: AROM;10 reps;Supine;Both Quad Sets: AROM;Left;10 reps;Supine Heel Slides: AAROM;Left;10 reps;Supine Hip ABduction/ADduction: AAROM;Left;10  reps;Supine Straight Leg Raises: AAROM;Left;10 reps;Supine   PT Goals Acute Rehab PT Goals Pt will go Supine/Side to Sit: with min assist PT Goal: Supine/Side to Sit - Progress: Progressing toward goal Pt will go Sit to Stand: with supervision PT Goal: Sit to Stand - Progress: Progressing toward goal Pt will Ambulate: 51 - 150 feet;with min assist;with least restrictive assistive device PT Goal: Ambulate - Progress: Progressing toward goal Pt will Perform Home Exercise Program: with min assist PT Goal: Perform Home Exercise Program - Progress: Progressing toward goal  Visit Information  Last PT Received On: 05/20/12    Subjective Data  Subjective: How long will I stay?  Don't know if I can make it without a wheelchair.   Cognition  Overall Cognitive Status: Appears within functional limits for tasks assessed/performed Arousal/Alertness: Awake/alert Behavior During Session: [facility] for tasks performed    Balance  Static Standing Balance Static Standing - Balance Support: Bilateral upper extremity supported Static Standing - Level of Assistance: 5: Stand by assistance Static Standing - Comment/# of Minutes: stood with supervision while PT getting chair  End of Session PT - End of Session Equipment Utilized During Treatment: Gait belt;Left knee immobilizer Activity Tolerance: Patient limited by pain Patient left: in chair;with call bell/phone within reach   GP     [facility] 05/20/2012, 10:31 AM Sheran Lawless, PT [phone] 05/20/2012

## 2012-05-20 NOTE — Progress Notes (Signed)
Physical Therapy Treatment Patient Details Name: Heidi Moore MRN: [id number] DOB: 12-23-1919 Today's Date: 05/20/2012 Time: 1515-1600 PT Time Calculation (min): 45 min  PT Assessment / Plan / Recommendation Comments on Treatment Session  Much improved with tolerance to ambulation though still antalgic and slow.  Needs to work to improve efficiency for decreased energy expenditure to ensure safe to d/c home.    Follow Up Recommendations  Home health PT;Supervision/Assistance - 24 hour                 Equipment Recommendations  None recommended by PT        Frequency 7X/week   Plan Discharge plan remains appropriate    Precautions / Restrictions Precautions Precautions: Knee;Fall Required Braces or Orthoses: Knee Immobilizer - Left Restrictions LLE Weight Bearing: Weight bearing as tolerated   Pertinent Vitals/Pain 7/10 left knee with mobility    Mobility  Bed Mobility Supine to Sit: 4: Min assist;HOB flat Sit to Supine: 4: Min assist;HOB flat Transfers Sit to Stand: 5: Supervision;With upper extremity assist;From bed Stand to Sit: 4: Min guard;To bed;With upper extremity assist Details for Transfer Assistance: cues for positioning in bed Ambulation/Gait Ambulation/Gait Assistance: 4: Min assist Ambulation Distance (Feet): 60 Feet Assistive device: Rolling walker Ambulation/Gait Assistance Details: still very slow and antalgic with increased energy expenditure, cues for posture and safety with walker Gait Pattern: Antalgic    Exercises Total Joint Exercises Ankle Circles/Pumps: AROM;Both;10 reps;Supine Short Arc Quad: AAROM;Left;10 reps;Supine Heel Slides: AAROM;Left;10 reps;Supine    PT Goals Acute Rehab PT Goals Pt will go Supine/Side to Sit: with supervision PT Goal: Supine/Side to Sit - Progress: Updated due to goal met Pt will go Sit to Supine/Side: with supervision PT Goal: Sit to Supine/Side - Progress: Updated due to goal met Pt will go Sit to  Stand: with modified independence PT Goal: Sit to Stand - Progress: Updated due to goal met Pt will Ambulate: 51 - 150 feet;with least restrictive assistive device;with supervision PT Goal: Ambulate - Progress: Updated due to goal met Pt will Perform Home Exercise Program: with min assist PT Goal: Perform Home Exercise Program - Progress: Progressing toward goal  Visit Information  Last PT Received On: 05/20/12    Subjective Data  Subjective: Feels a little better after using machine a little   Cognition  Overall Cognitive Status: Appears within functional limits for tasks assessed/performed Arousal/Alertness: Awake/alert Orientation Level: Appears intact for tasks assessed Behavior During Session: [facility] for tasks performed    Balance     End of Session PT - End of Session Equipment Utilized During Treatment: Gait belt;Left knee immobilizer Activity Tolerance: Patient tolerated treatment well Patient left: in bed;with call bell/phone within reach   GP     [facility] 05/20/2012, 4:48 PM Cyndi [place], PT [phone] 05/20/2012

## 2012-05-20 NOTE — Progress Notes (Signed)
[facility]  Patient Status: New  [facility] is providing the following services: PT and OT  On the schedule for a PT visit at home on Monday, if she goes home on Sunday  If patient discharges after hours, please call [phone].   Heidi Moore 05/20/2012, 3:13 PM

## 2012-05-21 LAB — CBC
MCH: 30.4 pg (ref 26.0–34.0)
MCHC: 33.3 g/dL (ref 30.0–36.0)
MCV: 91.3 fL (ref 78.0–100.0)
Platelets: 183 10*3/uL (ref 150–400)
RDW: 14.1 % (ref 11.5–15.5)
WBC: 11.7 10*3/uL — ABNORMAL HIGH (ref 4.0–10.5)

## 2012-05-21 NOTE — Progress Notes (Signed)
Patient ID: Heidi Moore, female   DOB: [birth year], 79 y.o.   MRN: [id number] Postoperative day 3 left total knee arthroplasty. Patient is slow with therapy. She states she is unsure whether she will be able to go home safely. Will reevaluate tomorrow for discharge to home versus discharge to short-term skilled nursing.

## 2012-05-21 NOTE — Progress Notes (Signed)
Physical Therapy Treatment Patient Details Name: Heidi Moore MRN: [id number] DOB: 01/21/1920 Today's Date: 05/21/2012 Time: 4098-1191 PT Time Calculation (min): 34 min  PT Assessment / Plan / Recommendation Comments on Treatment Session  Pt making steady progress toward goals. Pt now wanting to go to SNF short term before going home with daughter. SW has been notified by CM and is in process of intiating search. Will upday our discharge plan to reflect pt's and family wishes. Pt would benefit from continued therapy to incr strength, endurance and mobility to decr caregiver burden at home.                    Follow Up Recommendations  Supervision/Assistance - 24 hour;SNF           Equipment Recommendations  None recommended by PT       Frequency 7X/week   Plan Frequency remains appropriate;Discharge plan needs to be updated    Precautions / Restrictions Precautions Precautions: Knee Required Braces or Orthoses: Knee Immobilizer - Left Knee Immobilizer - Left: Discontinue once straight leg raise with < 10 degree lag Restrictions LLE Weight Bearing: Weight bearing as tolerated    Pertinent Vitals/Pain Pt rated her pain as 5-6/10 with therapy. Was premedicated.    Mobility  Bed Mobility Supine to Sit: 5: Supervision;HOB elevated (HOB elevated to 30 degrees) Details for Bed Mobility Assistance: cues to use the KI to move her leg to edge of bed and off the edge of the bed. cues for use of arms to elevated trunk without using the rails and for hand placment to better facilitate scooting to the edge of the bed. incr time needed. Transfers Sit to Stand: 4: Min guard;From bed;With upper extremity assist Stand to Sit: 5: Supervision;To chair/3-in-1;With upper extremity assist;With armrests Details for Transfer Assistance: cues for hand placement with transfers for safety with transfers. Ambulation/Gait Ambulation/Gait Assistance: 4: Min guard Ambulation Distance (Feet): 40  Feet Assistive device: Rolling walker Ambulation/Gait Assistance Details: pt's home walker way too tall for her on it's lowest setting, switched to hospital youth walker and pt with less reported shoulder pain with gait. cues for posture, walker position with gait and sequence with gait. Gait Pattern: Step-to pattern;Antalgic;Decreased stance time - left;Decreased step length - right Gait velocity: decreased    Exercises Total Joint Exercises Ankle Circles/Pumps: AROM;Both;10 reps;Supine Quad Sets: AROM;Strengthening;Left;10 reps;Supine Heel Slides: AAROM;Strengthening;Left;10 reps;Supine Hip ABduction/ADduction: AAROM;Strengthening;Left;10 reps;Supine Straight Leg Raises: AAROM;Strengthening;Left;10 reps;Supine    PT Goals Acute Rehab PT Goals PT Goal: Supine/Side to Sit - Progress: Progressing toward goal PT Goal: Sit to Stand - Progress: Progressing toward goal PT Goal: Ambulate - Progress: Progressing toward goal PT Goal: Up/Down Stairs - Progress: Progressing toward goal PT Goal: Perform Home Exercise Program - Progress: Progressing toward goal  Visit Information  Last PT Received On: 05/21/12 Assistance Needed: +1    Subjective Data  Subjective: Reports feeling a little better today and that she walked to the bathroom this am and was on the machine for 2 hours.   Cognition  Overall Cognitive Status: Appears within functional limits for tasks assessed/performed Arousal/Alertness: Awake/alert Orientation Level: Appears intact for tasks assessed Behavior During Session: [facility] for tasks performed       End of Session PT - End of Session Equipment Utilized During Treatment: Gait belt;Left knee immobilizer Activity Tolerance: Patient tolerated treatment well;Patient limited by fatigue Patient left: in chair;with call bell/phone within reach Nurse Communication: Mobility status   GP  Sallyanne Kuster 05/21/2012, 2:26 PM  Sallyanne Kuster, PTA Office- [phone]

## 2012-05-21 NOTE — Progress Notes (Signed)
Agree with update to d/c plan.

## 2012-05-22 NOTE — Progress Notes (Signed)
Physical Therapy Treatment Patient Details Name: Heidi Moore MRN: [id number] DOB: 26-Dec-1919 Today's Date: 05/22/2012 Time: 1015-     PT Assessment / Plan / Recommendation Comments on Treatment Session  Pt requested PT return later in the day as she was just placed on CPM.  Pt informed PT would make every effort to return and that patient should also ask for nursing assist to get OOB for lunch or other needs.  Pt agreed.    Follow Up Recommendations        Does the patient have the potential to tolerate intense rehabilitation     Barriers to Discharge        Equipment Recommendations       Recommendations for Other Services    Frequency     Plan      Precautions / Restrictions     Pertinent Vitals/Pain minimal    Mobility       Exercises     PT Diagnosis:    PT Problem List:   PT Treatment Interventions:     PT Goals    Visit Information  Last PT Received On: 05/22/12 Reason Eval/Treat Not Completed:  (pt on cpm and requested PT return in pm.  Will attempt.)    Subjective Data  Subjective: They just put this on me and I want to stay on it (CPM) for a while.  Can you come back in the afternoon? That would be better for me.   Cognition       Balance     End of Session     GP     Dennis Bast 05/22/2012, 12:51 PM

## 2012-05-22 NOTE — Progress Notes (Signed)
Physical Therapy Treatment Patient Details Name: Heidi Moore MRN: [id number] DOB: Nov 10, 1919 Today's Date: 05/22/2012 Time: 7829-5621 PT Time Calculation (min): 30 min  PT Assessment / Plan / Recommendation Comments on Treatment Session  Improved pain control and able to progress treatment focus on gait mechanics. Increased amb distance.  Still appropriate for SNF at d/c    Follow Up Recommendations  SNF     Does the patient have the potential to tolerate intense rehabilitation     Barriers to Discharge        Equipment Recommendations       Recommendations for Other Services    Frequency 7X/week   Plan Frequency remains appropriate;Discharge plan remains appropriate (SNF, waiting on SW consult?)    Precautions / Restrictions Precautions Precautions: Knee Precaution Comments: reminded patient not to prop knee on pillow.  Also confirmed she should float heels on pillow or towel roll.   Required Braces or Orthoses: Knee Immobilizer - Left (total assist to don KI) Knee Immobilizer - Left: Discontinue once straight leg raise with < 10 degree lag Restrictions Weight Bearing Restrictions: Yes LLE Weight Bearing: Weight bearing as tolerated   Pertinent Vitals/Pain 4/10    Mobility  Bed Mobility Bed Mobility: Supine to Sit Supine to Sit: 5: Supervision;HOB elevated Details for Bed Mobility Assistance: stepwise cues for safest techniques Transfers Transfers: Sit to Stand;Stand to Sit Sit to Stand: 4: Min guard;From bed;With upper extremity assist Stand to Sit: 5: Supervision;To chair/3-in-1;With upper extremity assist;With armrests Details for Transfer Assistance: cues for hand placement and to move operated leg out before sitting Ambulation/Gait Ambulation/Gait Assistance: 4: Min assist Ambulation Distance (Feet): 100 Feet Assistive device: Rolling walker Ambulation/Gait Assistance Details: instructional cues for step through gait pattern, needs increased physical  assistance to get 'rhythm' of pattern and oftentimes steps too close to front of walker with affect on stability.  cues to slow gait pattern while practicing step through with intact limb and expect increase time in stance on operated limb.  needs practice. Gait Pattern: Step-to pattern;Step-through pattern;Decreased stance time - left;Antalgic (decrease hip extension on left in stance) General Gait Details: decreased pain this session, with increased ability to participate in gait mechanics instruction, does better when talks less and concentrates on timing of walker and steps more. Stairs: No Wheelchair Mobility Wheelchair Mobility: No    Exercises Total Joint Exercises Ankle Circles/Pumps: AROM;Both;10 reps;Seated Quad Sets: AROM;10 reps;Both;Seated   PT Diagnosis:    PT Problem List:   PT Treatment Interventions:     PT Goals Acute Rehab PT Goals PT Goal: Supine/Side to Sit - Progress: Progressing toward goal PT Goal: Sit to Stand - Progress: Progressing toward goal PT Goal: Ambulate - Progress: Progressing toward goal PT Goal: Up/Down Stairs - Progress: Not progressing PT Goal: Perform Home Exercise Program - Progress: Progressing toward goal  Visit Information  Last PT Received On: 05/22/12 Assistance Needed: +1    Subjective Data  Subjective: I'm feeling much better today   Cognition  Overall Cognitive Status: Appears within functional limits for tasks assessed/performed Arousal/Alertness: Awake/alert Orientation Level: Appears intact for tasks assessed Behavior During Session: [facility] for tasks performed Cognition - Other Comments: very talkative, needs to be redirected to task often.      Balance     End of Session PT - End of Session Equipment Utilized During Treatment: Gait belt;Left knee immobilizer Activity Tolerance: Patient tolerated treatment well;Patient limited by fatigue Patient left: in chair;with call bell/phone within reach Nurse Communication: Mobility  status   GP     Narda Amber [facility] 05/22/2012, 4:44 PM

## 2012-05-22 NOTE — Progress Notes (Addendum)
Patient ID: Heidi Moore, female   DOB: 03-16-1920, 79 y.o.   MRN: [id number] Patient continues to have slow progress with therapy. I agree that the patient would benefit from short-term skilled nursing discharge. Patient states she is considering [facility] or [facility]. Possible discharge Monday. Social worker consult requested.

## 2012-05-22 NOTE — Clinical Social Work Placement (Addendum)
Clinical Social Work Department CLINICAL SOCIAL WORK PLACEMENT NOTE 05/22/2012  Patient:  DANNETTE, KINKAID  Account Number:  [id number] Admit date:  05/18/2012  Clinical Social Worker:  Dellie Burns, Theresia Majors  Date/time:  05/22/2012 01:00 PM  Clinical Social Work is seeking post-discharge placement for this patient at the following level of care:   SKILLED NURSING   (*CSW will update this form in Epic as items are completed)   05/22/2012  Patient/family provided with [facility] Department of Clinical Social Work's list of facilities offering this level of care within the geographic area requested by the patient (or if unable, by the patient's family).  05/22/2012  Patient/family informed of their freedom to choose among providers that offer the needed level of care, that participate in Medicare, Medicaid or managed care program needed by the patient, have an available bed and are willing to accept the patient.  05/22/2012  Patient/family informed of [facility]' ownership interest in [facility], as well as of the fact that they are under no obligation to receive care at this facility.  PASARR submitted to EDS on 05/22/2012 PASARR number received from EDS on 05/22/2012  FL2 transmitted to all facilities in geographic area requested by pt/family on  05/22/2012 FL2 transmitted to all facilities within larger geographic area on   Patient informed that his/her managed care company has contracts with or will negotiate with  certain facilities, including the following:     Patient/family informed of bed offers received:   Patient chooses bed at [facility] Physician recommends and patient chooses bed at    Patient to be transferred to  on  05/23/12 Patient to be transferred to facility by private vehicle  The following physician request were entered in Epic:   Additional Comments: Sabino Niemann, MSW, [phone]

## 2012-05-22 NOTE — Clinical Social Work Psychosocial (Signed)
Clinical Social Work Department BRIEF PSYCHOSOCIAL ASSESSMENT 05/22/2012  Patient:  Heidi Moore, Heidi Moore     Account Number:  [id number]     Admit date:  05/18/2012  Clinical Social Worker:  Skip Mayer  Date/Time:  05/22/2012 01:00 PM  Referred by:  Physician  Date Referred:  05/22/2012 Referred for  SNF Placement   Other Referral:   Interview type:  Patient Other interview type:    PSYCHOSOCIAL DATA Living Status:  ALONE Admitted from facility:   Level of care:   Primary support name:  Terri Primary support relationship to patient:  CHILD, ADULT Degree of support available:   Adequate, per pt    CURRENT CONCERNS Current Concerns  Post-Acute Placement   Other Concerns:    SOCIAL WORK ASSESSMENT / PLAN CSW met with pt re: PT recommendation for SNF.  CSW explained placement process and answered questions.  Pt reports first choice if [facility] as it is close to her home, but is agreeable to full [facility] search as b/u plan.  CSW completed FL2 and initiated SNF search.  Weekday CSW to f/u with offers.   Assessment/plan status:  Information/Referral to [facility] Other assessment/ plan:   Information/referral to community resources:   SNF  PTAR    PATIENT'S/FAMILY'S RESPONSE TO PLAN OF CARE: Pt reports agreeable to ST SNF stay in order to increase strength and independence with mobility prior to return home.  Pt verbalized understanding of placement process and appreciation for CSW assist.        Dellie Burns, MSW, LCSWA [phone] (Weekends 8:00am-4:30pm)

## 2012-05-22 NOTE — Progress Notes (Signed)
Pt states she provided admission with new insurance information. Daughter will bring insurance card in am and will have Unit RN fax to admission. Pt states her dtr, Everlene Balls [phone]. Home [phone]. Pt is willing to d/c to [facility] if no private rooms are available. Isidoro Donning RN CCM Case Mgmt phone [phone]

## 2012-05-23 LAB — URINALYSIS, ROUTINE W REFLEX MICROSCOPIC
Bilirubin Urine: NEGATIVE
Glucose, UA: NEGATIVE mg/dL
Hgb urine dipstick: NEGATIVE
Protein, ur: NEGATIVE mg/dL

## 2012-05-23 NOTE — Progress Notes (Signed)
Patient discharged in stable condition via wheelchair to SNF. Discharge packet was given.

## 2012-05-23 NOTE — Progress Notes (Signed)
Patient has a bed at [facility] and can d/c when medically stable.  Sabino Niemann, MSW, [phone]

## 2012-05-23 NOTE — Progress Notes (Signed)
Clinical social worker assisted with patient discharge to skilled nursing facility, [facility].  CSW addressed all family questions and concerns. CSW copied chart and added all important documents. Patient was transported by private vehicle Clinical Social Worker will sign off for now as social work intervention is no longer needed.  Heidi Moore, MSW, [phone] 

## 2012-05-23 NOTE — Progress Notes (Signed)
Physical Therapy Treatment Patient Details Name: Heidi Moore MRN: [id number] DOB: 10/31/1919 Today's Date: 05/23/2012 Time: 4098-1191 PT Time Calculation (min): 34 min  PT Assessment / Plan / Recommendation Comments on Treatment Session  pt rpesents with L TKA.  pt notes not having ahd any meds this morning.  RN made aware.  pt indicating burning with urination and concern for UTI.      Follow Up Recommendations  SNF     Does the patient have the potential to tolerate intense rehabilitation     Barriers to Discharge        Equipment Recommendations  None recommended by PT    Recommendations for Other Services    Frequency 7X/week   Plan Frequency remains appropriate;Discharge plan remains appropriate    Precautions / Restrictions Precautions Precautions: Knee Required Braces or Orthoses: Knee Immobilizer - Left Knee Immobilizer - Left: Discontinue once straight leg raise with < 10 degree lag Restrictions Weight Bearing Restrictions: Yes LLE Weight Bearing: Weight bearing as tolerated   Pertinent Vitals/Pain Pt indicates L knee quite painful and burning with urination.  RN made aware.    Mobility  Bed Mobility Bed Mobility: Supine to Sit;Sitting - Scoot to Edge of Bed Supine to Sit: 5: Supervision;HOB elevated Sitting - Scoot to Edge of Bed: 5: Supervision Details for Bed Mobility Assistance: pt moves slowly and uses KI to A with moving L LE.   Transfers Transfers: Sit to Stand;Stand to Sit Sit to Stand: 4: Min guard;With upper extremity assist;From bed;From toilet Stand to Sit: 5: Supervision;With upper extremity assist;To toilet;To chair/3-in-1;With armrests Details for Transfer Assistance: cues to get clsoer to toilet/recliner prior to sitting and for use of armrests and grab bar.   Ambulation/Gait Ambulation/Gait Assistance: 4: Min guard Ambulation Distance (Feet): 15 Feet (x2) Assistive device: Rolling walker Ambulation/Gait Assistance Details: cues for gait  sequencing, upright posture, positioning in RW Gait Pattern: Step-to pattern;Decreased step length - right;Decreased stance time - left;Trunk flexed Stairs: No Wheelchair Mobility Wheelchair Mobility: No    Exercises     PT Diagnosis:    PT Problem List:   PT Treatment Interventions:     PT Goals Acute Rehab PT Goals Time For Goal Achievement: 05/24/12 Potential to Achieve Goals: Good PT Goal: Supine/Side to Sit - Progress: Partly met PT Goal: Sit to Stand - Progress: Progressing toward goal PT Goal: Ambulate - Progress: Progressing toward goal  Visit Information  Last PT Received On: 05/23/12 Assistance Needed: +1    Subjective Data  Subjective: I think I'm getting a urinary tract infection.     Cognition  Overall Cognitive Status: Appears within functional limits for tasks assessed/performed Arousal/Alertness: Awake/alert Orientation Level: Appears intact for tasks assessed Behavior During Session: [facility] for tasks performed Cognition - Other Comments: very talkative, needs to be redirected to task often.      Balance  Balance Balance Assessed: No  End of Session PT - End of Session Equipment Utilized During Treatment: Gait belt;Left knee immobilizer Activity Tolerance: Patient limited by pain Patient left: in chair;with call bell/phone within reach Nurse Communication: Mobility status;Patient requests pain meds CPM Left Knee CPM Left Knee: Off   GP     Sunny Schlein, [place] [phone] 05/23/2012, 9:29 AM

## 2012-05-23 NOTE — Progress Notes (Signed)
Occupational Therapy Treatment Patient Details Name: Heidi Moore MRN: [id number] DOB: 1920/04/14 Today's Date: 05/23/2012 Time: 1020-1030 OT Time Calculation (min): 10 min  OT Assessment / Plan / Recommendation Comments on Treatment Session Pt does not have 24 hour S at home at this time.  Pt still needs min to mod assist for all LE adls and min guard for all tranfers.  Pt would be unsafe at home w/o 24/7 S at this time.  Rec SNF d/c then home.    Follow Up Recommendations  SNF    Barriers to Discharge       Equipment Recommendations  3 in 1 bedside comode    Recommendations for Other Services    Frequency Min 2X/week   Plan Discharge plan needs to be updated    Precautions / Restrictions Precautions Precautions: Knee Required Braces or Orthoses: Knee Immobilizer - Left Knee Immobilizer - Left: Discontinue once straight leg raise with < 10 degree lag Restrictions Weight Bearing Restrictions: Yes LLE Weight Bearing: Weight bearing as tolerated   Pertinent Vitals/Pain Pt with 4/10 pain.  Vitals stable.    ADL  Eating/Feeding: Performed;Set up Where Assessed - Eating/Feeding: Edge of bed Upper Body Dressing: Performed;Set up Where Assessed - Upper Body Dressing: Unsupported sitting Lower Body Dressing: Moderate assistance Where Assessed - Lower Body Dressing: Supported sit to stand Toilet Transfer: Hydrographic surveyor Method: Surveyor, minerals: Materials engineer and Hygiene: Minimal assistance Where Assessed - Engineer, mining and Hygiene: Standing Equipment Used: Rolling walker;Gait belt Transfers/Ambulation Related to ADLs: Pt with increased I today only needing min guard but very anxious about transfer to SNF today. ADL Comments: Pt cont to need assist with LE adls due to knee pain and decreased ROM.    OT Diagnosis:    OT Problem List:   OT Treatment Interventions:     OT Goals Acute  Rehab OT Goals OT Goal Formulation: With patient/family Time For Goal Achievement: 05/26/12 Potential to Achieve Goals: Good ADL Goals Pt Will Perform Grooming: with supervision;Standing at sink Pt Will Perform Lower Body Bathing: with min assist;Sit to stand from chair;Sit to stand from bed Pt Will Perform Lower Body Dressing: with min assist;Sit to stand from chair;Sit to stand from bed ADL Goal: Lower Body Dressing - Progress: Progressing toward goals Pt Will Transfer to Toilet: with supervision;with DME;Ambulation ADL Goal: Toilet Transfer - Progress: Progressing toward goals Pt Will Perform Toileting - Clothing Manipulation: with supervision;Sitting on 3-in-1 or toilet;Standing ADL Goal: Toileting - Clothing Manipulation - Progress: Progressing toward goals Pt Will Perform Toileting - Hygiene: with supervision;Sit to stand from 3-in-1/toilet ADL Goal: Toileting - Hygiene - Progress: Progressing toward goals Pt Will Perform Tub/Shower Transfer: Shower transfer;with min assist;Ambulation  Visit Information  Last OT Received On: 05/23/12 Assistance Needed: +1    Subjective Data      Prior Functioning       Cognition  Overall Cognitive Status: Appears within functional limits for tasks assessed/performed Arousal/Alertness: Awake/alert Orientation Level: Appears intact for tasks assessed Behavior During Session: [facility] for tasks performed Cognition - Other Comments: very talkative, needs to be redirected to task often.      Mobility  Shoulder Instructions Bed Mobility Bed Mobility: Sit to Supine Supine to Sit: 5: Supervision;HOB elevated Sitting - Scoot to Edge of Bed: 5: Supervision Sit to Supine: 4: Min assist;HOB flat Details for Bed Mobility Assistance: pt moves slowly and uses KI to A with moving L LE.  Transfers Transfers: Sit to Stand;Stand to Sit Sit to Stand: 4: Min guard;With upper extremity assist;From bed;From toilet Stand to Sit: 5: Supervision;With upper  extremity assist;To toilet;To chair/3-in-1;With armrests Details for Transfer Assistance: cues to get clsoer to toilet/recliner prior to sitting and for use of armrests and grab bar.         Exercises      Balance Balance Balance Assessed: No   End of Session OT - End of Session Activity Tolerance: Patient tolerated treatment well Patient left: in bed;with call bell/phone within reach;with nursing in room;with family/visitor present Nurse Communication: Mobility status CPM Left Knee CPM Left Knee: Off  GO     Hope Budds 05/23/2012, 10:38 AM [phone]

## 2012-05-23 NOTE — Discharge Summary (Signed)
Physician Discharge Summary  Patient ID: Heidi Moore MRN: [id number] DOB/AGE: Oct 01, 1919 79 y.o.  Admit date: 05/18/2012 Discharge date: 05/23/2012  Admission Diagnoses:  Osteoarthritis of left knee  Discharge Diagnoses:  Principal Problem:  *Osteoarthritis of left knee   Past Medical History  Diagnosis Date  . PVD (peripheral vascular disease)     No AAA on U/S x 2   . Diverticulosis   . Allergic rhinitis   . Osteopenia     Intolerance to Calcium,Actonel,Fosamax,Evista, Forteo: S/P Boniva 2007 to 03/2010  . History of skin cancer     Basal cell, Dr. Leta Speller  . Hyperlipidemia     [place] Study LDL goal =<160   . Complication of anesthesia     headaches    Surgeries: Procedure(s): COMPUTER ASSISTED LEFT TOTAL KNEE ARTHROPLASTY on 05/18/2012   Consultants (if any):  NONE  Discharged Condition: Improved  Hospital Course: Heidi Moore is an 79 y.o. female who was admitted 05/18/2012 with a diagnosis of Osteoarthritis of left knee and went to the operating room on 05/18/2012 and underwent the above named procedures.    She was given perioperative antibiotics:  Anti-infectives     Start     Dose/Rate Route Frequency Ordered Stop   05/19/12 0130   ceFAZolin (ANCEF) IVPB 1 g/50 mL premix        1 g 100 mL/hr over 30 Minutes Intravenous Every 12 hours 05/18/12 1801 05/19/12 1101   05/17/12 1424   ceFAZolin (ANCEF) IVPB 2 g/50 mL premix        2 g 100 mL/hr over 30 Minutes Intravenous 60 min pre-op 05/17/12 1424 05/18/12 1330        .  She was given sequential compression devices, early ambulation, and aspirin for DVT prophylaxis. CPM utilized and ice packs as needed.  Wound healing well.  Dressing change daily or as needed. Pt complained of urgency and burning with urination on the day of discharge.  Specimen obtained for urinalysis and culture with results pending at the time of discharge.  She benefited maximally from the hospital stay and there were no  complications.    Recent vital signs:  Filed Vitals:   05/23/12 0641  BP: 122/53  Pulse: 86  Temp: 97.5 F (36.4 C)  Resp: 16    Recent laboratory studies:  Lab Results  Component Value Date   HGB 9.1* 05/21/2012   HGB 9.3* 05/20/2012   HGB 10.8* 05/19/2012   Lab Results  Component Value Date   WBC 11.7* 05/21/2012   PLT 183 05/21/2012   Lab Results  Component Value Date   INR 1.07 05/09/2012   Lab Results  Component Value Date   NA 135 05/19/2012   K 3.9 05/19/2012   CL 102 05/19/2012   CO2 26 05/19/2012   BUN 9 05/19/2012   CREATININE 0.72 05/19/2012   GLUCOSE 124* 05/19/2012    Discharge Medications:     Medication List     As of 05/23/2012 11:45 AM    TAKE these medications         acetaminophen 500 MG tablet   Commonly known as: TYLENOL   Take 500 mg by mouth every 6 (six) hours as needed. For pain      aspirin 325 MG EC tablet   Take 1 tablet (325 mg total) by mouth daily with breakfast.      B-complex with vitamin C tablet   Take 1 tablet by mouth daily.  beta carotene w/minerals tablet   Take 1 tablet by mouth daily.      cholecalciferol 1000 UNITS tablet   Commonly known as: VITAMIN D   Take 1,000 Units by mouth 3 (three) times daily.      diphenhydramine-acetaminophen 25-500 MG Tabs   Commonly known as: TYLENOL PM   Take 1 tablet by mouth at bedtime as needed. For pain      estradiol 0.0375 mg/24hr   Commonly known as: CLIMARA - Dosed in mg/24 hr   Place 1 patch onto the skin once a week.      Magnesium 250 MG Tabs   Take 750 mg by mouth at bedtime.      methocarbamol 500 MG tablet   Commonly known as: ROBAXIN   Take 1 tablet (500 mg total) by mouth every 6 (six) hours as needed.      METROGEL 1 % gel   Generic drug: metroNIDAZOLE   APPLY ONCE DAILY AFTER CLEANING      multivitamin with minerals Tabs   Take 1 tablet by mouth daily.      oxyCODONE-acetaminophen 5-325 MG per tablet   Commonly known as: PERCOCET/ROXICET   Take 2 tablets by  mouth every 6 (six) hours as needed for pain.      vitamin C 500 MG tablet   Commonly known as: ASCORBIC ACID   Take 500 mg by mouth 2 (two) times daily.      vitamin E 400 UNIT capsule   Generic drug: vitamin E   Take 400 Units by mouth daily.        Diagnostic Studies: Dg Chest 2 View  05/09/2012  *RADIOLOGY REPORT*  Clinical Data: Preoperative respiratory evaluation for knee replacement  CHEST - 2 VIEW  Comparison: None.  Findings: The lungs are clear without focal infiltrate, edema, pneumothorax or pleural effusion. The cardiopericardial silhouette is within normal limits for size. Imaged bony structures of the thorax are intact.  Hyperexpansion suggests emphysema.  IMPRESSION: Emphysema without acute cardiopulmonary process.   Original Report Authenticated By: Kennith Center, [name]     Disposition: Final discharge disposition not confirmed      Discharge Orders    Future Orders Please Complete By Expires   Diet - low sodium heart healthy      Call MD / Call 911      Comments:   If you experience chest pain or shortness of breath, CALL 911 and be transported to the hospital emergency room.  If you develope a fever above 101 F, pus (white drainage) or increased drainage or redness at the wound, or calf pain, call your surgeon's office.   Constipation Prevention      Comments:   Drink plenty of fluids.  Prune juice may be helpful.  You may use a stool softener, such as Colace (over the counter) 100 mg twice a day.  Use MiraLax (over the counter) for constipation as needed.   Increase activity slowly as tolerated      Discharge instructions      Comments:   Keep knee incision dry for 5 days post op then may wet while bathing. Therapy daily and CPM goal full extension and greater than 90 degrees flexion. Call if fever or chills or increased drainage. Go to ER if acutely short of breath or call for ambulance. Aspirin 325mg  po qd for DVT prophyaxis Return for follow up in 2  weeks. May full weight bear on the surgical leg unless told otherwise. Use knee  immobilizer until able to straight leg raise off bed with knee stable. In house walking for first 2 weeks.   CPM      Comments:   Continuous passive motion machine (CPM):      Use the CPM from 0 to 90 or degree of comfort for 6 hours per day.      You may increase by 10 per day.  You may break it up into 2 or 3 sessions per day.      Use CPM for 2 weeks or until you are told to stop.   Do not put a pillow under the knee. Place it under the heel.       Ice packs to knee daily or as needed for pain.  Encourage active ROM of knee. Follow-up Information    Follow up with Eldred Manges, MD. Schedule an appointment as soon as possible for a visit in 2 weeks.   Contact information:   [address] [place] [address] [phone]           Signed: Wende Neighbors 05/23/2012, 11:45 AM

## 2012-05-23 NOTE — Progress Notes (Signed)
Subjective: 5 Days Post-Op Procedure(s) (LRB): COMPUTER ASSISTED TOTAL KNEE ARTHROPLASTY (Left) Patient reports pain as mild.   Pt c/o burning and urgency with urination.  Will get specimen before discharge for UA Objective: Vital signs in last 24 hours: Temp:  [97.5 F (36.4 C)-99.1 F (37.3 C)] 97.5 F (36.4 C) (01/13 0641) Pulse Rate:  [86-102] 86  (01/13 0641) Resp:  [15-16] 16  (01/13 0641) BP: (104-122)/(43-53) 122/53 mmHg (01/13 0641) SpO2:  [96 %-100 %] 96 % (01/13 0641)  Intake/Output from previous day: 01/12 0701 - 01/13 0700 In: 120 [P.O.:120] Out: -  Intake/Output this shift:     Basename 05/21/12 0650  HGB 9.1*    Basename 05/21/12 0650  WBC 11.7*  RBC 2.99*  HCT 27.3*  PLT 183   No results found for this basename: NA:2,K:2,CL:2,CO2:2,BUN:2,CREATININE:2,GLUCOSE:2,CALCIUM:2 in the last 72 hours No results found for this basename: LABPT:2,INR:2 in the last 72 hours  Neurovascular intact Sensation intact distally Intact pulses distally Dorsiflexion/Plantar flexion intact Incision: no drainage  Assessment/Plan: 5 Days Post-Op Procedure(s) (LRB): COMPUTER ASSISTED TOTAL KNEE ARTHROPLASTY (Left) Discharge to SNF UA / C&S today before discharge.  Elridge Stemm M 05/23/2012, 11:36 AM

## 2012-05-24 LAB — URINE CULTURE
Colony Count: 6000
Special Requests: NORMAL

## 2012-07-07 ENCOUNTER — Ambulatory Visit: Payer: Medicare Other | Attending: Orthopaedic Surgery | Admitting: Physical Therapy

## 2012-07-07 DIAGNOSIS — M25669 Stiffness of unspecified knee, not elsewhere classified: Secondary | ICD-10-CM | POA: Insufficient documentation

## 2012-07-07 DIAGNOSIS — M25569 Pain in unspecified knee: Secondary | ICD-10-CM | POA: Insufficient documentation

## 2012-07-07 DIAGNOSIS — M6281 Muscle weakness (generalized): Secondary | ICD-10-CM | POA: Insufficient documentation

## 2012-07-07 DIAGNOSIS — R262 Difficulty in walking, not elsewhere classified: Secondary | ICD-10-CM | POA: Insufficient documentation

## 2012-07-07 DIAGNOSIS — Z96659 Presence of unspecified artificial knee joint: Secondary | ICD-10-CM | POA: Insufficient documentation

## 2012-07-07 DIAGNOSIS — IMO0001 Reserved for inherently not codable concepts without codable children: Secondary | ICD-10-CM | POA: Insufficient documentation

## 2012-07-11 ENCOUNTER — Telehealth: Payer: Self-pay | Admitting: *Deleted

## 2012-07-11 NOTE — Telephone Encounter (Signed)
Spoke with patient at the request of Dr. Alwyn Ren to inquire which PCP she prefers. Dr. Alwyn Ren last saw this patient in April 2013 for CPE but patient has expressed interest in transferring care to Dr. Selena Batten at the [facility]  location due to the close proximity to her home. I advised the patient that would be fine but she needs to schedule appt after August 20, 2002 to establish medical relationship. She verbalized understanding and agreed to do so. SGJ, RN

## 2012-07-12 ENCOUNTER — Telehealth: Payer: Self-pay | Admitting: Internal Medicine

## 2012-07-12 ENCOUNTER — Ambulatory Visit: Payer: Medicare Other | Admitting: Rehabilitation

## 2012-07-18 ENCOUNTER — Ambulatory Visit: Payer: Medicare Other | Attending: Orthopaedic Surgery | Admitting: Physical Therapy

## 2012-07-18 DIAGNOSIS — M25669 Stiffness of unspecified knee, not elsewhere classified: Secondary | ICD-10-CM | POA: Insufficient documentation

## 2012-07-18 DIAGNOSIS — M25569 Pain in unspecified knee: Secondary | ICD-10-CM | POA: Insufficient documentation

## 2012-07-18 DIAGNOSIS — R262 Difficulty in walking, not elsewhere classified: Secondary | ICD-10-CM | POA: Insufficient documentation

## 2012-07-18 DIAGNOSIS — M6281 Muscle weakness (generalized): Secondary | ICD-10-CM | POA: Insufficient documentation

## 2012-07-18 DIAGNOSIS — IMO0001 Reserved for inherently not codable concepts without codable children: Secondary | ICD-10-CM | POA: Insufficient documentation

## 2012-07-18 DIAGNOSIS — Z96659 Presence of unspecified artificial knee joint: Secondary | ICD-10-CM | POA: Insufficient documentation

## 2012-07-21 ENCOUNTER — Ambulatory Visit: Payer: Medicare Other | Admitting: Rehabilitation

## 2012-07-22 ENCOUNTER — Encounter: Payer: Medicare Other | Admitting: Rehabilitation

## 2012-07-25 ENCOUNTER — Ambulatory Visit: Payer: Medicare Other | Admitting: Rehabilitation

## 2012-07-26 ENCOUNTER — Ambulatory Visit: Payer: Medicare Other | Admitting: Rehabilitation

## 2012-07-28 ENCOUNTER — Ambulatory Visit: Payer: Medicare Other | Admitting: Physical Therapy

## 2012-08-01 ENCOUNTER — Telehealth: Payer: Self-pay | Admitting: *Deleted

## 2012-08-01 ENCOUNTER — Ambulatory Visit: Payer: Medicare Other | Admitting: Rehabilitation

## 2012-08-01 NOTE — Telephone Encounter (Signed)
Received request from [facility] for orders to be signed by PCP. Pt has expressed that she is seeing Dr Selena Batten @[place]  Alita Chyle, forms faxed to [facility] office due to pt having up coming appt there.

## 2012-08-02 ENCOUNTER — Encounter: Payer: Medicare Other | Admitting: Rehabilitation

## 2012-08-04 ENCOUNTER — Ambulatory Visit: Payer: Medicare Other | Admitting: Physical Therapy

## 2012-08-04 ENCOUNTER — Ambulatory Visit: Payer: Medicare Other | Admitting: Rehabilitation

## 2012-08-08 ENCOUNTER — Ambulatory Visit: Payer: Medicare Other | Admitting: Physical Therapy

## 2012-08-24 ENCOUNTER — Ambulatory Visit (INDEPENDENT_AMBULATORY_CARE_PROVIDER_SITE_OTHER): Payer: Medicare Other | Admitting: Family Medicine

## 2012-08-24 ENCOUNTER — Encounter: Payer: Self-pay | Admitting: Family Medicine

## 2012-08-24 VITALS — BP 100/60 | HR 86 | Temp 98.6°F | Ht 62.0 in | Wt 121.0 lb

## 2012-08-24 DIAGNOSIS — N951 Menopausal and female climacteric states: Secondary | ICD-10-CM

## 2012-08-24 DIAGNOSIS — E785 Hyperlipidemia, unspecified: Secondary | ICD-10-CM

## 2012-08-24 DIAGNOSIS — E612 Magnesium deficiency: Secondary | ICD-10-CM

## 2012-08-24 DIAGNOSIS — H3530 Unspecified macular degeneration: Secondary | ICD-10-CM | POA: Insufficient documentation

## 2012-08-24 DIAGNOSIS — R232 Flushing: Secondary | ICD-10-CM | POA: Insufficient documentation

## 2012-08-24 DIAGNOSIS — E559 Vitamin D deficiency, unspecified: Secondary | ICD-10-CM

## 2012-08-24 LAB — BASIC METABOLIC PANEL
CO2: 28 mEq/L (ref 19–32)
Chloride: 106 mEq/L (ref 96–112)
Sodium: 139 mEq/L (ref 135–145)

## 2012-08-24 LAB — LIPID PANEL
Total CHOL/HDL Ratio: 3
Triglycerides: 65 mg/dL (ref 0.0–149.0)

## 2012-08-24 NOTE — Progress Notes (Signed)
Chief Complaint  Patient presents with  . Establish Care    HPI:  Heidi Moore is here to establish care. Had knee replacement in January and is recovering well. Her PCP moved and she wanted to be closer to her doctor so wanted to establish care here.  Has the following chronic problems and concerns today:  Patient Active Problem List  Diagnosis  . VITAMIN D DEFICIENCY  . HYPERLIPIDEMIA  . ALLERGIC RHINITIS  . DIVERTICULOSIS, COLON  . ROSACEA  . OSTEOPENIA  . SKIN CANCER, HX OF  . LOW BACK PAIN SYNDROME  . Unspecified adverse effect of unspecified drug, medicinal and biological substance  . Osteoarthritis of left knee  . Macular degeneration - followed by optho, Dr. Georgianne Moore  . Hot flashes - followed by gyn, Dr. Noemi Moore flashes/Depression: -sees Dr. Huntley Moore gynecology for this -takes estrodiol  Health Maintenance: -will follow up for medicare wellness exam  ROS: See pertinent positives and negatives per HPI.  Past Medical History  Diagnosis Date  . PVD (peripheral vascular disease)     No AAA on U/S x 2   . Diverticulosis   . Allergic rhinitis   . Osteopenia     Intolerance to Calcium,Actonel,Fosamax,Evista, Forteo: S/P Boniva 2007 to 03/2010  . History of skin cancer     Basal cell, Dr. Leta Speller  . Hyperlipidemia     [place] Study LDL goal =<160   . Complication of anesthesia     headaches    Family History  Problem Relation Age of Onset  . Colon cancer Other     1st Cousin   . Stroke Mother 46  . Diabetes Brother   . Atrial fibrillation Brother     coumadin  . Lung cancer Sister     History   Social History  . Marital Status: Widowed    Spouse Name: N/A    Number of Children: N/A  . Years of Education: N/A   Social History Main Topics  . Smoking status: Never Smoker   . Smokeless tobacco: None  . Alcohol Use: No  . Drug Use: No  . Sexually Active: None   Other Topics Concern  . 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 For pain, Disp: , Rfl: ;  B Complex-C (B-COMPLEX WITH VITAMIN C) tablet, Take 1 tablet by mouth daily., Disp: , Rfl: ;  beta carotene w/minerals (OCUVITE) tablet, Take 1 tablet by mouth daily., Disp: , Rfl:  calcium carbonate (TUMS - DOSED IN MG ELEMENTAL CALCIUM) 500 MG chewable tablet, Chew 1 tablet by mouth daily., Disp: , Rfl: ;  cholecalciferol (VITAMIN D) 1000 UNITS tablet, Take 1,000 Units by mouth 3 (three) times daily., Disp: , Rfl: ;  estradiol (ESTRACE) 0.5 MG tablet, Take 0.5 mg by mouth daily., Disp: , Rfl:  HYDROcodone-acetaminophen (  NORCO/VICODIN) 5-325 MG per tablet, Take 1 tablet by mouth every 6 (six) hours as needed for pain., Disp: , Rfl: ;  Magnesium 250 MG TABS, Take 750 mg by mouth at bedtime., Disp: , Rfl: ;  METROGEL 1 % gel, APPLY ONCE DAILY AFTER CLEANING, Disp: 60 g, Rfl: 0;  Multiple Vitamin (MULTIVITAMIN WITH MINERALS) TABS, Take 1 tablet by mouth daily., Disp: , Rfl:  OVER THE COUNTER MEDICATION, [facility]- [place] -7 daily., Disp: , Rfl: ;  vitamin C (ASCORBIC ACID) 500 MG tablet, Take 500 mg by mouth 2 (two) times daily., Disp: , Rfl: ;  vitamin E (VITAMIN E) 400 UNIT capsule, Take 400 Units by mouth daily., Disp: , Rfl:   EXAM:  Filed Vitals:   08/24/12 1129  BP: 100/60  Pulse: 86  Temp: 98.6 F (37 C)    Body mass index is 22.13 kg/(m^2).  GENERAL: vitals reviewed and listed above, alert, oriented, appears well  hydrated and in no acute distress  HEENT: atraumatic, conjunttiva clear, no obvious abnormalities on inspection of external nose and ears  NECK: no obvious masses on inspection  LUNGS: clear to auscultation bilaterally, no wheezes, rales or rhonchi, good air movement  CV: HRRR, no peripheral edema  MS: moves all extremities without noticeable abnormality  PSYCH: pleasant and cooperative, no obvious depression or anxiety  ASSESSMENT AND PLAN:  Discussed the following assessment and plan:  Macular degeneration - followed by optho, Dr. Georgianne Moore  Hot flashes - followed by gyn, Dr. Huntley Moore  Unspecified vitamin D deficiency  Magnesium deficiency - Plan: Basic metabolic panel, Magnesium  Hyperlipemia - Plan: Lipid Panel  -We reviewed the PMH, PSH, FH, SH, Meds and Allergies. -We provided refills for any medications we will prescribe as needed. -We addressed current concerns per orders and patient instructions. -We have advised patient to follow up per instructions below for medicare wellness exam per her wishes  -Patient advised to return or notify a doctor immediately if symptoms worsen or persist or new concerns arise.  Patient Instructions   We recommend the following healthy lifestyle measures: - eat a healthy diet consisting of lots of vegetables, fruits, beans, nuts, seeds, healthy meats such as white chicken and fish and whole grains.  - avoid fried foods, fast food, processed foods, sodas, red meet and other fattening foods.  - get a least 150 minutes of aerobic exercise per week.   Follow up in: follow for ou medicare wellness exam whenever is convenient for you      [name]

## 2012-08-24 NOTE — Patient Instructions (Signed)
  We recommend the following healthy lifestyle measures: - eat a healthy diet consisting of lots of vegetables, fruits, beans, nuts, seeds, healthy meats such as white chicken and fish and whole grains.  - avoid fried foods, fast food, processed foods, sodas, red meet and other fattening foods.  - get a least 150 minutes of aerobic exercise per week.   Follow up in: follow for ou medicare wellness exam whenever is convenient for you

## 2012-08-25 ENCOUNTER — Ambulatory Visit: Payer: Medicare Other | Attending: Orthopaedic Surgery | Admitting: Rehabilitation

## 2012-08-25 ENCOUNTER — Telehealth: Payer: Self-pay | Admitting: Family Medicine

## 2012-08-25 DIAGNOSIS — IMO0001 Reserved for inherently not codable concepts without codable children: Secondary | ICD-10-CM | POA: Insufficient documentation

## 2012-08-25 DIAGNOSIS — R262 Difficulty in walking, not elsewhere classified: Secondary | ICD-10-CM | POA: Insufficient documentation

## 2012-08-25 DIAGNOSIS — Z96659 Presence of unspecified artificial knee joint: Secondary | ICD-10-CM | POA: Insufficient documentation

## 2012-08-25 DIAGNOSIS — M6281 Muscle weakness (generalized): Secondary | ICD-10-CM | POA: Insufficient documentation

## 2012-08-25 DIAGNOSIS — M25669 Stiffness of unspecified knee, not elsewhere classified: Secondary | ICD-10-CM | POA: Insufficient documentation

## 2012-08-25 DIAGNOSIS — M25569 Pain in unspecified knee: Secondary | ICD-10-CM | POA: Insufficient documentation

## 2012-08-25 NOTE — Telephone Encounter (Signed)
Left a detailed message for pt at designated home number.

## 2012-08-25 NOTE — Telephone Encounter (Signed)
Labs look good.

## 2012-08-29 ENCOUNTER — Ambulatory Visit: Payer: Medicare Other | Admitting: Physical Therapy

## 2012-09-01 ENCOUNTER — Ambulatory Visit: Payer: Medicare Other | Admitting: Rehabilitation

## 2012-09-02 ENCOUNTER — Encounter: Payer: Self-pay | Admitting: Family Medicine

## 2012-09-02 ENCOUNTER — Ambulatory Visit (INDEPENDENT_AMBULATORY_CARE_PROVIDER_SITE_OTHER): Payer: Medicare Other | Admitting: Family Medicine

## 2012-09-02 VITALS — BP 108/68 | HR 91 | Temp 97.7°F | Wt 120.0 lb

## 2012-09-02 DIAGNOSIS — Z0000 Encounter for general adult medical examination without abnormal findings: Secondary | ICD-10-CM

## 2012-09-02 NOTE — Progress Notes (Signed)
Medicare Annual Preventive Care Visit  (annual wellness exam)  1.) Patient-completed health risk assessment  - completed and reviewed, see scanned documentation  2.) Review of Medical History: -PMH, PSH, Family History and current specialty and care providers reviewed and updated and listed below  - see chart and below  3.) Review of functional ability and level of safety:  Any difficulty hearing? NO  History of falling?  NO  Any trouble with IADLs - using a phone, using transportation, grocery shopping, preparing meals, doing housework, doing laundry, taking medications and managing money? NO  Advance Directives? YES, daughter Lillard Anes is HCPOA [phone]  See summary of recommendations in Patient Instructions below.  4.) Physical Exam Filed Vitals:   09/02/12 1117  BP: 108/68  Pulse: 91  Temp: 97.7 F (36.5 C)   Estimated body mass index is 21.94 kg/(m^2) as calculated from the following:   Height as of 08/24/12: 5\' 2"  (1.575 m).   Weight as of this encounter: 120 lb (54.432 kg).  Mini Cog: 1. Patient instructed to listen carefully and repeat the following: Apple Watch    Penny  2. Clock drawing test was administered: NORMAL     3. Recall of three words  Scoring:  Number of words recalled? 3 If 3 STOP = negative screen for cognitive impairment If 1-2 --> is CDT normal or abnormal? Normal = negative screen for cognitive impairment Abnormal = positive screen for cognitive impairment Patient Score: NEG   -see scanned mini cog  See patient instructions for recommendations.  4)The following written screening schedule of preventive measures were reviewed with assessment and plan made per below, orders and patient instructions:      AAA screening -N/A     Alcohol screening - neg     Obesity Screening and counseling - done, not obese     STI screening - done     Tobacco Screening - done       Pneumococcal (PPSV23 -one dose after 64, one before if risk  factors), influenza yearly and hepatitis B vaccines (if high risk - end stage renal disease, IV drugs, homosexual men, live in home for mentally retarded, hemophilia receiving factors) ASSESSMENT/PLAN: completed      Screening mammograph (yearly if >40) ASSESSMENT/PLAN:completed - she wants to stop doing those      Screening Pap smear/pelvic exam (q2 years) ASSESSMENT/PLAN: not indicated      Prostate cancer screening ASSESSMENT/PLAN: N/A      Colorectal cancer screening (FOBT yearly or flex sig q4y or colonoscopy q10y or barium enema q4y) ASSESSMENT/PLAN: Not indicated      Diabetes outpatient self-management training services ASSESSMENT/PLAN: N/A      Bone mass measurements(covered q2y if indicated - estrogen def, osteoporosis, hyperparathyroid, vertebral abnormalities, osteoporosis or steroids) ASSESSMENT/PLAN: followed by gyn, treated in the past, does not wish to do this anymore      Screening for glaucoma(q1y if high risk - diabetes, FH, AA and > 50 or hispanic and > 65) ASSESSMENT/PLAN: completed - followed closely by optho, last exam 04/2012      Medical nutritional therapy for individuals with diabetes or renal disease ASSESSMENT/PLAN: N/A      Cardiovascular screening blood tests (lipids q5y) ASSESSMENT/PLAN: completed      Diabetes screening tests ASSESSMENT/PLAN: completed   7.) Summary: -risk factors and conditions per above assessment were discussed and treatment, recommendations and referrals were offered per documentation above and orders and patient instructions. She is a very healthy 79 yo F with  a healthy lifestyle. She has aged out of many screening tests, or prefers to stop these due to her age. She has her daughter designated as Product manager. She is very functional in ADL AIDLs and has no depression or falls. Follow up in 6 months.

## 2012-09-05 ENCOUNTER — Ambulatory Visit: Payer: Medicare Other | Admitting: Rehabilitation

## 2012-09-05 ENCOUNTER — Telehealth: Payer: Self-pay | Admitting: Family Medicine

## 2012-09-05 NOTE — Telephone Encounter (Signed)
The American [employer] Guidelines from 2012 do not recommend routine prophylactic antibiotics before dental work in folks with joint replacement. She has many medication allergies and risks of abx would likely out way benefits. If dentist or her orthopedic feel benefits out millway risks then they can prescribe her an antibiotic.

## 2012-09-05 NOTE — Telephone Encounter (Signed)
Called and spoke with pt and pt is aware. Pt states she called her dentist office and was told to contact her PCP to have antibiotic called in.  Advised pt of Dr. Elmyra Ricks recommendations.  Pt will attempt to contact orthopedic specialist to see if their office can prescribe medication.

## 2012-09-05 NOTE — Telephone Encounter (Signed)
Caller: Heidi Moore/Patient; Phone: [phone]; Reason for Call: Patient calling to get abx prior to dentist appointment on 05/06 due to knee replacement on 01/08.  Patient was told Clindamycin may work since she is allergic to Penicillin. Patient going to therapy and will be back after 12p.

## 2012-09-05 NOTE — Telephone Encounter (Signed)
Pls advise.  

## 2012-09-08 ENCOUNTER — Ambulatory Visit: Payer: Medicare Other | Attending: Orthopaedic Surgery | Admitting: Rehabilitation

## 2012-09-08 DIAGNOSIS — M6281 Muscle weakness (generalized): Secondary | ICD-10-CM | POA: Insufficient documentation

## 2012-09-08 DIAGNOSIS — Z96659 Presence of unspecified artificial knee joint: Secondary | ICD-10-CM | POA: Insufficient documentation

## 2012-09-08 DIAGNOSIS — M25569 Pain in unspecified knee: Secondary | ICD-10-CM | POA: Insufficient documentation

## 2012-09-08 DIAGNOSIS — R262 Difficulty in walking, not elsewhere classified: Secondary | ICD-10-CM | POA: Insufficient documentation

## 2012-09-08 DIAGNOSIS — M25669 Stiffness of unspecified knee, not elsewhere classified: Secondary | ICD-10-CM | POA: Insufficient documentation

## 2012-09-08 DIAGNOSIS — IMO0001 Reserved for inherently not codable concepts without codable children: Secondary | ICD-10-CM | POA: Insufficient documentation

## 2012-09-26 ENCOUNTER — Telehealth: Payer: Self-pay | Admitting: Internal Medicine

## 2012-09-26 NOTE — Telephone Encounter (Signed)
Find out what she uses this for and whom prescribed it in the past. May or may not refill after getting this info.

## 2012-09-26 NOTE — Telephone Encounter (Signed)
Pls advise.  

## 2012-09-27 MED ORDER — METRONIDAZOLE 1 % EX GEL: Freq: Every day | CUTANEOUS | Status: DC

## 2012-09-27 NOTE — Telephone Encounter (Signed)
Rx sent to the pharmacy for 1 yr.

## 2012-09-27 NOTE — Telephone Encounter (Signed)
Called and spoke with pt and pt states she uses this for her rosecea and Dr. Alwyn Ren prescribed it for pt.  Pt states she has been on this medication for 40 plus years. Pls advise.

## 2012-09-27 NOTE — Telephone Encounter (Signed)
Left a message for pt that rx sent to pharmacy for 1 yr.

## 2012-09-27 NOTE — Telephone Encounter (Signed)
Ok to refill x 1 year Thanks 

## 2012-09-27 NOTE — Addendum Note (Signed)
Addended by: Azucena Freed on: 09/27/2012 02:15 PM   Modules accepted: Orders

## 2012-09-29 ENCOUNTER — Telehealth: Payer: Self-pay

## 2012-09-29 NOTE — Telephone Encounter (Signed)
Per Dr. Elmyra Ricks request called to advised that ordering physician should receive paperwork.

## 2012-10-11 ENCOUNTER — Telehealth: Payer: Self-pay

## 2012-10-11 NOTE — Telephone Encounter (Signed)
Per Dr. Elmyra Ricks request called [facility] to advise that ordering physcian should sign off on pt's paperwork.  Spoke with Delice Bison- she states that Dr. Alwyn Ren would not sign off on orders so they were sent to Dr. Selena Batten.  Advised that pt did not establish care with Dr. Selena Batten until 08/24/12.

## 2012-11-04 ENCOUNTER — Telehealth: Payer: Self-pay | Admitting: Family Medicine

## 2012-11-04 NOTE — Telephone Encounter (Signed)
Patient Information:  Caller Name: Myana  Phone: [phone]  Patient: Heidi Moore, Heidi Moore  Gender: Female  DOB: 26-Mar-1920  Age: 79 Years  PCP: Kriste Basque [facility])  Office Follow Up:  Does the office need to follow up with this patient?: No  Instructions For The Office: N/A  RN Note:  Urging patient to go to [facility] yet today with the Sx that she has but she is wanting to just stop by the office to give urine specimen.  Office closed, recommending UCC.  Symptoms  Reason For Call & Symptoms: Has had urinary frequency, burning and pain when urinates since Fri 6/20. Urinating frequently and trying to push fluids.  Feeling achy over bladder area.  Reviewed Health History In EMR: Yes  Reviewed Medications In EMR: Yes  Reviewed Allergies In EMR: Yes  Reviewed Surgeries / Procedures: Yes  Date of Onset of Symptoms: 10/28/2012  Treatments Tried: Motrin 200mg   Treatments Tried Worked: No  Guideline(s) Used:  Urination Pain - Female  Disposition Per Guideline:   See Today in Office  Reason For Disposition Reached:   > 2 UTIs in last year  Advice Given:  Fluids:   Drink extra fluids. Drink 8-10 glasses of liquids a day (Reason: to produce a dilute, non-irritating urine).  Warm Saline SITZ Baths to Reduce Pain:  Sit in a warm saline bath for 20 minutes to cleanse the area and to reduce pain. Add 2 oz. of table salt or baking soda to a tub of water.  Call Back If:  You become worse.  Patient Refused Recommendation:  Patient Will Follow Up With Office Later  Has wanted to just stop by office for urine specimen.  Urging UCC today.

## 2012-11-07 ENCOUNTER — Encounter: Payer: Self-pay | Admitting: Family Medicine

## 2012-11-07 ENCOUNTER — Ambulatory Visit (INDEPENDENT_AMBULATORY_CARE_PROVIDER_SITE_OTHER): Payer: Medicare Other | Admitting: Family Medicine

## 2012-11-07 VITALS — BP 124/70 | Temp 98.2°F | Wt 120.0 lb

## 2012-11-07 DIAGNOSIS — R300 Dysuria: Secondary | ICD-10-CM

## 2012-11-07 LAB — POCT URINALYSIS DIPSTICK
Bilirubin, UA: NEGATIVE
Ketones, UA: NEGATIVE

## 2012-11-07 NOTE — Progress Notes (Signed)
Chief Complaint  Patient presents with  . Dysuria    HPI:  Acute visit for dysuria: -started about 1 week ago -symptoms: dysuria, frequency and urgency, some diarrhea - then felt better -denies: fevers, nausea, vomiting, flank pain -reports hx of UTI  ROS: See pertinent positives and negatives per HPI.  Past Medical History  Diagnosis Date  . PVD (peripheral vascular disease)     No AAA on U/S x 2   . Diverticulosis   . Allergic rhinitis   . Osteopenia     Intolerance to Calcium,Actonel,Fosamax,Evista, Forteo: S/P Boniva 2007 to 03/2010  . History of skin cancer     Basal cell, Dr. Leta Speller  . Hyperlipidemia     [place] Study LDL goal =<160   . Complication of anesthesia     headaches    Family History  Problem Relation Age of Onset  . Colon cancer Other     1st Cousin   . Stroke Mother 12  . Diabetes Brother   . Atrial fibrillation Brother     coumadin  . Lung cancer Sister     History   Social History  . Marital Status: Widowed    Spouse Name: N/A    Number of Children: N/A  . Years of Education: N/A   Social History Main Topics  . Smoking status: Never Smoker   . Smokeless tobacco: None  . Alcohol Use: No  . Drug Use: No  . Sexually Active: None   Other Topics Concern  . 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 For pain, Disp: , Rfl: ;  B Complex-C (B-COMPLEX WITH VITAMIN C) tablet, Take 1  tablet by mouth daily., Disp: , Rfl: ;  beta carotene w/minerals (OCUVITE) tablet, Take 1 tablet by mouth daily., Disp: , Rfl:  calcium carbonate (TUMS - DOSED IN MG ELEMENTAL CALCIUM) 500 MG chewable tablet, Chew 1 tablet by mouth daily., Disp: , Rfl: ;  cholecalciferol (VITAMIN D) 1000 UNITS tablet, Take 1,000 Units by mouth 3 (three) times daily., Disp: , Rfl: ;  estradiol (ESTRACE) 0.5 MG tablet, Take 0.5 mg by mouth daily., Disp: , Rfl:  HYDROcodone-acetaminophen (NORCO/VICODIN) 5-325 MG per tablet, Take 1 tablet by mouth every 6 (six) hours as needed for pain., Disp: , Rfl: ;  Magnesium 250 MG TABS, Take 750 mg by mouth at bedtime., Disp: , Rfl: ;  metroNIDAZOLE (METROGEL) 1 % gel, Apply topically daily., Disp: 60 g, Rfl: 11;  Multiple Vitamin (MULTIVITAMIN WITH MINERALS) TABS, Take 1 tablet by mouth daily., Disp: , Rfl:  OVER THE COUNTER MEDICATION, [facility]- Arthro -7 daily., Disp: , Rfl: ;  vitamin C (ASCORBIC ACID) 500 MG tablet, Take 500 mg by mouth 2 (two) times daily., Disp: , Rfl: ;  vitamin E (VITAMIN E) 400 UNIT capsule, Take 400 Units by mouth  daily., Disp: , Rfl:   EXAM:  Filed Vitals:   11/07/12 0917  BP: 124/70  Temp: 98.2 F (36.8 C)    Body mass index is 21.94 kg/(m^2).  GENERAL: vitals reviewed and listed above, alert, oriented, appears well hydrated and in no acute distress  HEENT: atraumatic, conjunttiva clear, no obvious abnormalities on inspection of external nose and ears  NECK: no obvious masses on inspection  LUNGS: clear to auscultation bilaterally, no wheezes, rales or rhonchi, good air movement  CV: HRRR, no peripheral edema  ABD: BS+, soft, NTTP  MS: moves all extremities without noticeable abnormality  PSYCH: pleasant and cooperative, no obvious depression or anxiety  ASSESSMENT AND PLAN:  Discussed the following assessment and plan:  Dysuria - Plan: POCT urinalysis dipstick, Culture, Urine  -urine dip not convincing, discuss risks benefits  empiric abx - pt would like to wait on culture prior to starting abx as is feeling a little better -advised plenty of fluids, cranberry juice and to let us know immediately if feels worse -Patient advised to return or notify a doctor immediately if symptoms worsen or persist or new concerns arise.  Patient Instructions  -drink plenty of fluids and cranberry juice  -can try Azo over the counter  -we will call you about the culture  -call us if you feel worse     Anddy Wingert R.

## 2012-11-07 NOTE — Patient Instructions (Addendum)
-  drink plenty of fluids and cranberry juice  -can try Azo over the counter  -we will call you about the culture  -call us if you feel worse

## 2012-11-14 ENCOUNTER — Ambulatory Visit (INDEPENDENT_AMBULATORY_CARE_PROVIDER_SITE_OTHER): Payer: Medicare Other | Admitting: Family Medicine

## 2012-11-14 ENCOUNTER — Telehealth: Payer: Self-pay

## 2012-11-14 ENCOUNTER — Encounter: Payer: Self-pay | Admitting: Family Medicine

## 2012-11-14 VITALS — BP 100/78 | Temp 97.8°F

## 2012-11-14 DIAGNOSIS — R300 Dysuria: Secondary | ICD-10-CM

## 2012-11-14 LAB — POCT URINALYSIS DIPSTICK
Bilirubin, UA: NEGATIVE
Blood, UA: NEGATIVE
Glucose, UA: NEGATIVE
Ketones, UA: NEGATIVE
Nitrite, UA: POSITIVE
pH, UA: 7

## 2012-11-14 MED ORDER — NITROFURANTOIN MONOHYD MACRO 100 MG PO CAPS: 100.0000 mg | ORAL_CAPSULE | Freq: Two times a day (BID) | ORAL | Status: DC

## 2012-11-14 NOTE — Progress Notes (Signed)
Chief Complaint  Patient presents with  . Dysuria    HPI:  Acute visit for Dysuria: -seen last week with dysuria and urine dip with leuks - offered empiric abx but pt opted to wait on culture -culture ordered, but no results in system - staff checking on why this was not done, appears to be lab error -symptoms felt better for a few days but then returned and include: burning with urination, frequency, urgency -denies: fevers, flank pain, NVD -she reports has taken nitrofurantoin in the past for UTIs and wants to take this  ROS: See pertinent positives and negatives per HPI.  Past Medical History  Diagnosis Date  . PVD (peripheral vascular disease)     No AAA on U/S x 2   . Diverticulosis   . Allergic rhinitis   . Osteopenia     Intolerance to Calcium,Actonel,Fosamax,Evista, Forteo: S/P Boniva 2007 to 03/2010  . History of skin cancer     Basal cell, Dr. Leta Speller  . Hyperlipidemia     [place] Study LDL goal =<160   . Complication of anesthesia     headaches    Family History  Problem Relation Age of Onset  . Colon cancer Other     1st Cousin   . Stroke Mother 29  . Diabetes Brother   . Atrial fibrillation Brother     coumadin  . Lung cancer Sister     History   Social History  . Marital Status: Widowed    Spouse Name: N/A    Number of Children: N/A  . Years of Education: N/A   Social History Main Topics  . Smoking status: Never Smoker   . Smokeless tobacco: None  . Alcohol Use: No  . Drug Use: No  . Sexually Active: None   Other Topics Concern  . 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 For pain, Disp: , Rfl: ;  B Complex-C (B-COMPLEX WITH VITAMIN C) tablet, Take 1 tablet by mouth daily., Disp: , Rfl: ;  beta carotene w/minerals (OCUVITE) tablet, Take 1 tablet by mouth daily., Disp: , Rfl:  calcium carbonate (TUMS - DOSED IN MG ELEMENTAL CALCIUM) 500 MG chewable tablet, Chew 1 tablet by mouth daily., Disp: , Rfl: ;  cholecalciferol (VITAMIN D) 1000 UNITS tablet, Take 1,000 Units by mouth 3 (three) times daily., Disp: , Rfl: ;  estradiol (ESTRACE) 0.5 MG tablet, Take 0.5 mg by mouth daily., Disp: , Rfl:  HYDROcodone-acetaminophen (NORCO/VICODIN) 5-325 MG per tablet, Take 1 tablet by mouth every 6 (six) hours as needed for pain., Disp: , Rfl: ;  Magnesium 250 MG TABS, Take 750 mg by mouth at bedtime., Disp: , Rfl: ;  metroNIDAZOLE (METROGEL) 1 % gel, Apply topically daily., Disp: 60 g, Rfl: 11;  Multiple Vitamin (MULTIVITAMIN WITH MINERALS) TABS, Take 1 tablet by  mouth daily., Disp: , Rfl:  OVER THE COUNTER MEDICATION, [facility]- [place]thro -7 daily., Disp: , Rfl: ;  vitamin C (ASCORBIC ACID) 500 MG tablet, Take 500 mg by mouth 2 (two) times daily., Disp: , Rfl: ;  vitamin E (VITAMIN E) 400 UNIT capsule, Take 400 Units by mouth daily., Disp: , Rfl: ;  nitrofurantoin, macrocrystal-monohydrate, (MACROBID) 100 MG capsule, Take 1 capsule (100 mg total) by mouth 2 (two) times daily., Disp: 14 capsule, Rfl: 0  EXAM:  Filed Vitals:   11/14/12 1346  BP: 100/78  Temp: 97.8 F (36.6 C)    There is no weight on file to calculate BMI.  GENERAL: vitals reviewed and listed above, alert, oriented, appears well hydrated and in no acute distress  HEENT: atraumatic, conjunttiva clear, no obvious abnormalities on inspection of external nose and ears  NECK: no obvious masses on inspection  LUNGS: clear to  auscultation bilaterally, no wheezes, rales or rhonchi, good air movement  CV: HRRR, no peripheral edema  ABD: soft, NTTP, no CVA TTP  MS: moves all extremities without noticeable abnormality  PSYCH: pleasant and cooperative, no obvious depression or anxiety  ASSESSMENT AND PLAN:  Discussed the following assessment and plan:  Dysuria - Plan: POCT urinalysis dipstick, nitrofurantoin, macrocrystal-monohydrate, (MACROBID) 100 MG capsule  -tx empirically in case of UTI given smx and urine dip, rechecking urine and reordered culture and have alerted office manager to investigate why culture done -recommendations per orders an instructions, risks and use of medications and return precautions discussed. -Patient advised to return or notify a doctor immediately if symptoms worsen or persist or new concerns arise.  There are no Patient Instructions on file for this visit.   [name]

## 2012-11-14 NOTE — Telephone Encounter (Signed)
Pt called to get urine culture results.  Advised pt that results were not back yet.  Pt states she is waking up every 1 hour to 1.5 hours to urinate.  Pt states she has taken 4 days of AZO and her symptoms are not improving.  Pls advise.

## 2012-11-14 NOTE — Telephone Encounter (Signed)
Patient Information:  Caller Name: Zully  Phone: [phone]  Patient: Heidi Moore, Heidi Moore  Gender: Female  DOB: 10-21-1919  Age: 79 Years  PCP: Kriste Basque [facility])  Office Follow Up:  Does the office need to follow up with this patient?: No  Instructions For The Office: N/A  RN Note:  Patient calling about urinary pain.  States she was seen a week ago in the office, and told to take AZO while culture was pending.  States never heard back from the office, but has been taking AZO for a week.  Wants to know how much she is able to take.  States has had no improvement in her symptoms.  States she is no worse than when seen, but no better, either.  Per Epic, no culture results seen; only "collected."  Per urination pain protocol, advised appt today in office; appt scheduled 1345 11/14/12 with Dr. Selena Batten.  krs/can  Symptoms  Reason For Call & Symptoms: UTI follow up; seen in office 11/07/12 but never heard back from office  Reviewed Health History In EMR: Yes  Reviewed Medications In EMR: Yes  Reviewed Allergies In EMR: Yes  Reviewed Surgeries / Procedures: Yes  Date of Onset of Symptoms: 10/31/2012  Guideline(s) Used:  Urination Pain - Female  Disposition Per Guideline:   See Today in Office  Reason For Disposition Reached:   Age > 50 years  Advice Given:  N/A  Patient Will Follow Care Advice:  YES  Appointment Scheduled:  11/14/2012 13:45:00 Appointment Scheduled Provider:  Kriste Basque (Family Practice)

## 2012-11-16 NOTE — Progress Notes (Signed)
Quick Note:  Left a detailed message for pt at designated home number. ______ 

## 2012-11-21 ENCOUNTER — Telehealth: Payer: Self-pay

## 2012-11-21 NOTE — Telephone Encounter (Signed)
Pt called and states she has finished the antibiotic and she is not hurting as bad in her lower intestines.  Pt states she is still having buring with urination.  Per dr. Selena Batten pt advised that antibiotics continue to work after the last pill has been taken.  Advised pt to drink plenty of cranberry juice and water.  If symptoms worsen or pt has new onset of symptoms pt advised to come in to office.

## 2012-11-24 ENCOUNTER — Ambulatory Visit (INDEPENDENT_AMBULATORY_CARE_PROVIDER_SITE_OTHER): Payer: Medicare Other | Admitting: Family Medicine

## 2012-11-24 ENCOUNTER — Encounter: Payer: Self-pay | Admitting: Family Medicine

## 2012-11-24 VITALS — BP 110/62 | HR 74 | Temp 97.4°F | Wt 120.0 lb

## 2012-11-24 DIAGNOSIS — R300 Dysuria: Secondary | ICD-10-CM

## 2012-11-24 LAB — POCT URINALYSIS DIPSTICK
Bilirubin, UA: NEGATIVE
Blood, UA: NEGATIVE
Glucose, UA: NEGATIVE
Nitrite, UA: NEGATIVE
Spec Grav, UA: 1.01
Urobilinogen, UA: 0.2

## 2012-11-24 NOTE — Patient Instructions (Addendum)
Keep drinking plenty of fluids. Follow up for any fever or worsening symptoms.

## 2012-11-24 NOTE — Progress Notes (Signed)
Subjective:    Patient ID: Heidi Moore, female    DOB: 1919-07-21, 79 y.o.   MRN: [id number]  HPI Acute visit Some persistent dysuria. She has some mild suprapubic and periurethral burning mostly prior to urination and following urination. Recent urine culture was negative. She was treated with course of Macrobid and finished that. Her symptoms are actually somewhat improved compared to 2 days ago. Denies any vaginal discharge. No itching. No fevers or chills. No flank pain. Denies any bowel changes such as constipation or diarrhea. Previous history of total abdominal hysterectomy.  Denies any stress incontinence symptoms. Only mild urinary urgency. She's used Azo-Standard with slight improvement.  Patient continues to see gynecologist. She takes estrogen topical patch.  Past Medical History  Diagnosis Date  . PVD (peripheral vascular disease)     No AAA on U/S x 2   . Diverticulosis   . Allergic rhinitis   . Osteopenia     Intolerance to Calcium,Actonel,Fosamax,Evista, Forteo: S/P Boniva 2007 to 03/2010  . History of skin cancer     Basal cell, Dr. Leta Speller  . Hyperlipidemia     [place] Study LDL goal =<160   . Complication of anesthesia     headaches   Past Surgical History  Procedure Laterality Date  . Total abdominal hysterectomy w/ bilateral salpingoophorectomy      for Endometriosis   . Colonoscopy      with Tics (no further f/u as per GI due to age)   . Cataract extraction, bilateral    . Nose surgery      due to trama at age 4   . Mohs surgery      right nostril for basel cell cancer   . Bunionectomy  10/12/2008  . Lt knee       menis  . Knee arthroplasty  05/18/2012    Procedure: COMPUTER ASSISTED TOTAL KNEE ARTHROPLASTY;  Surgeon: Eldred Manges, MD;  Location: [facility] OR;  Service: Orthopedics;  Laterality: Left;  Left Total Knee Arthroplasty, Cemented, Computer Assisted    reports that she has never smoked. She does not have any smokeless tobacco history  on file. She reports that she does not drink alcohol or use illicit drugs. family history includes Atrial fibrillation in her brother; Colon cancer in her other; Diabetes in her brother; Lung cancer in her sister; and Stroke (age of onset: 53) in her mother. Allergies  Allergen Reactions  . Alendronate Sodium Nausea And Vomiting  . Anaplex Hd Nausea And Vomiting  . Aspirin Nausea And Vomiting  . Azithromycin Nausea And Vomiting  . Calcium Nausea And Vomiting  . Codeine Nausea And Vomiting  . Doxycycline Nausea And Vomiting  . Hydrocodone Nausea And Vomiting  . Ibandronate Sodium     REACTION: severe body aches  . Ibuprofen Nausea And Vomiting  . Penicillins Nausea And Vomiting  . Raloxifene Nausea And Vomiting  . Risedronate Sodium Nausea And Vomiting  . Sulfonamide Derivatives Nausea And Vomiting  . Teriparatide Nausea And Vomiting      Review of Systems  Constitutional: Negative for fever and chills.  Genitourinary: Positive for dysuria and frequency. Negative for hematuria and flank pain.  Musculoskeletal: Negative for back pain.       Objective:   Physical Exam  Constitutional: She appears well-developed and well-nourished.  Cardiovascular: Normal rate and regular rhythm.   Pulmonary/Chest: Effort normal and breath sounds normal. No respiratory distress. She has no wheezes. She has no rales.  Abdominal: Soft. Bowel  sounds are normal. She exhibits no distension and no mass. There is no tenderness. There is no rebound and no guarding.          Assessment & Plan:  Recurrent dysuria. Recent urine culture negative. Urine dipstick today only trace leukocytes otherwise negative. Question atrophic vaginitis. Doubt UTI with minimal finding above. Symptoms are mild. Patient already on topical estrogen-patch. Observation for now. If symptoms persist, she will discuss with gynecologist possible topical vaginal estrogen

## 2012-12-12 ENCOUNTER — Ambulatory Visit (INDEPENDENT_AMBULATORY_CARE_PROVIDER_SITE_OTHER): Payer: Medicare Other | Admitting: Family Medicine

## 2012-12-12 ENCOUNTER — Other Ambulatory Visit (HOSPITAL_COMMUNITY)
Admission: RE | Admit: 2012-12-12 | Discharge: 2012-12-12 | Disposition: A | Discharge status: Home / Self Care | Payer: Medicare Other | Source: Ambulatory Visit | Attending: Family Medicine | Admitting: Family Medicine

## 2012-12-12 ENCOUNTER — Encounter: Payer: Self-pay | Admitting: Family Medicine

## 2012-12-12 ENCOUNTER — Telehealth: Payer: Self-pay | Admitting: Family Medicine

## 2012-12-12 VITALS — BP 110/70 | Temp 97.7°F | Wt 121.0 lb

## 2012-12-12 DIAGNOSIS — R300 Dysuria: Secondary | ICD-10-CM

## 2012-12-12 DIAGNOSIS — N760 Acute vaginitis: Secondary | ICD-10-CM | POA: Insufficient documentation

## 2012-12-12 LAB — POCT URINALYSIS DIPSTICK
Bilirubin, UA: NEGATIVE
Blood, UA: NEGATIVE
Glucose, UA: NEGATIVE
Nitrite, UA: NEGATIVE
Urobilinogen, UA: 0.2
pH, UA: 5.5

## 2012-12-12 NOTE — Progress Notes (Signed)
Chief Complaint  Patient presents with  . recurrent uti    HPI:  Acute visit for dysuria: -started 4 days ago -symptoms: frequency, urgency, burning with urination -denies: fevers, chills, flank pain, NV, blood in urine, vaginal discharge, pelvic pain -saw Dr. Caryl Never a few weeks ago for these symptoms and udip ok, thought to be vaginal atrophy, sees gyn and on estrace for this  ROS: See pertinent positives and negatives per HPI.  Past Medical History  Diagnosis Date  . PVD (peripheral vascular disease)     No AAA on U/S x 2   . Diverticulosis   . Allergic rhinitis   . Osteopenia     Intolerance to Calcium,Actonel,Fosamax,Evista, Forteo: S/P Boniva 2007 to 03/2010  . History of skin cancer     Basal cell, Dr. Leta Speller  . Hyperlipidemia     [place] Study LDL goal =<160   . Complication of anesthesia     headaches    Family History  Problem Relation Age of Onset  . Colon cancer Other     1st Cousin   . Stroke Mother 9  . Diabetes Brother   . Atrial fibrillation Brother     coumadin  . Lung cancer Sister     History   Social History  . Marital Status: Widowed    Spouse Name: N/A    Number of Children: N/A  . Years of Education: N/A   Social History Main Topics  . Smoking status: Never Smoker   . Smokeless tobacco: None  . Alcohol Use: No  . Drug Use: No  . Sexually Active: None   Other Topics Concern  . 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 For pain, Disp: , Rfl: ;  B Complex-C (B-COMPLEX WITH VITAMIN C) tablet, Take 1 tablet by mouth daily., Disp: , Rfl: ;  beta carotene w/minerals (OCUVITE) tablet, Take 1 tablet by mouth daily., Disp: , Rfl:  calcium carbonate (TUMS - DOSED IN MG ELEMENTAL CALCIUM) 500 MG chewable tablet, Chew 1 tablet by mouth daily., Disp: , Rfl: ;  cholecalciferol (VITAMIN D) 1000 UNITS tablet, Take 1,000 Units by mouth 3 (three) times daily., Disp: , Rfl: ;  estradiol (ESTRACE) 0.5 MG tablet, Take 0.5 mg by mouth daily., Disp: , Rfl:  HYDROcodone-acetaminophen (NORCO/VICODIN) 5-325 MG per tablet, Take 1 tablet by mouth every 6 (six) hours as needed for pain., Disp: , Rfl: ;  Magnesium 250 MG TABS, Take 750 mg by mouth at bedtime., Disp: , Rfl: ;  metroNIDAZOLE (METROGEL) 1 % gel, Apply topically daily., Disp: 60 g, Rfl: 11;  Multiple Vitamin (MULTIVITAMIN WITH MINERALS) TABS, Take 1 tablet by mouth daily., Disp: , Rfl:  OVER THE COUNTER MEDICATION, [facility]- [place]thro -7 daily., Disp: , Rfl: ;  vitamin C (ASCORBIC ACID) 500 MG tablet, Take 500 mg  by mouth 2 (two) times daily., Disp: , Rfl: ;  vitamin E (VITAMIN E) 400 UNIT capsule, Take 400 Units by mouth daily., Disp: , Rfl:   EXAM:  Filed Vitals:   12/12/12 1504  BP: 110/70  Temp: 97.7 F (36.5 C)    Body mass index is 22.13 kg/(m^2).  GENERAL: vitals reviewed and listed above, alert, oriented, appears well hydrated and in no acute distress  HEENT: atraumatic, conjunttiva clear, no obvious abnormalities on inspection of external nose and ears  NECK: no obvious masses on inspection  ABD:BS+, soft, NTTP  GU: no vag discharge, vaginal atrophy  MS: moves all extremities without noticeable abnormality  PSYCH: pleasant and cooperative, no obvious depression or anxiety  ASSESSMENT AND PLAN:  Discussed the following assessment and plan:  Dysuria - Plan: Culture, Urine, Cervicovaginal ancillary only, POCT  urine qual dipstick blood  -likely yeast or atrophic vaginitis -udip, ucx, wet prep today - tx pending results -if work up neg will condider having her see gyn/urology for persistent recurrent symptoms of dysuria -Patient advised to return or notify a doctor immediately if symptoms worsen or persist or new concerns arise.  There are no Patient Instructions on file for this visit.   [name]

## 2012-12-12 NOTE — Telephone Encounter (Signed)
Patient Information:  Caller Name: Aeva  Phone: [phone]  Patient: Heidi Moore, Heidi Moore  Gender: Female  DOB: 1920-04-27  Age: 79 Years  PCP: Kriste Basque [facility])  Office Follow Up:  Does the office need to follow up with this patient?: No  Instructions For The Office: N/A   Symptoms  Reason For Call & Symptoms: Patient calling, started with burning on urination 4 weeks ago.  Was seen in the office then.  Completed an antibiotic but continued to have sx.  Had a repeat urine specimen done.  Continues to have burning on urination.  No blood in the urine.  Reviewed Health History In EMR: Yes  Reviewed Medications In EMR: Yes  Reviewed Allergies In EMR: Yes  Reviewed Surgeries / Procedures: Yes  Date of Onset of Symptoms: 11/08/2012  Treatments Tried: antibiotic tx  Treatments Tried Worked: No  Guideline(s) Used:  Urination Pain - Female  Disposition Per Guideline:   See Today in Office  Reason For Disposition Reached:   Age > 50 years  Advice Given:  N/A  Patient Will Follow Care Advice:  YES  Appointment Scheduled:  12/12/2012 15:00:00 Appointment Scheduled Provider:  Kriste Basque [facility])  She is very concerned about the ongoing sx that she has.  Has a trip planned to visit her son on 8/19.

## 2012-12-12 NOTE — Addendum Note (Signed)
Addended by: Azucena Freed on: 12/12/2012 04:04 PM   Modules accepted: Orders

## 2012-12-14 LAB — URINE CULTURE
Colony Count: NO GROWTH
Organism ID, Bacteria: NO GROWTH

## 2012-12-14 MED ORDER — METRONIDAZOLE 500 MG PO TABS: 500.0000 mg | ORAL_TABLET | Freq: Two times a day (BID) | ORAL | Status: DC

## 2012-12-14 NOTE — Progress Notes (Signed)
Quick Note:  Called and pt and pt is aware. Rx sent to pharmacy. ______

## 2012-12-14 NOTE — Addendum Note (Signed)
Addended by: Azucena Freed on: 12/14/2012 09:57 AM   Modules accepted: Orders

## 2013-01-23 ENCOUNTER — Telehealth: Payer: Self-pay | Admitting: Family Medicine

## 2013-01-23 DIAGNOSIS — R300 Dysuria: Secondary | ICD-10-CM

## 2013-01-23 NOTE — Telephone Encounter (Signed)
Pt called and stated that she still has a bladder infection. She would like to make an appointment to see a Urologist, without being seen here. She states that she has seen Dr. Selena Batten multiple times for this problem, and she does not want to come back in this office. She wants to go directly to a urologist. Please assist.

## 2013-01-23 NOTE — Telephone Encounter (Signed)
Fine to send referral to urology for chronic dysuria. NONE of the cultures we did here recently showed a bladder infection.

## 2013-01-23 NOTE — Addendum Note (Signed)
Addended by: Kern Reap B on: 01/23/2013 11:33 AM   Modules accepted: Orders

## 2013-01-27 ENCOUNTER — Telehealth: Payer: Self-pay | Admitting: Family Medicine

## 2013-01-27 NOTE — Telephone Encounter (Signed)
Patient calling in follow up to bladder symptoms.  States she has been treated for bladder irritation.  Per Epic, Dr. Selena Batten has approved a referral to urologist.  Patient advised referral has been made to [facility]; their office will call her directly to set up appt.  No further questions or concerns at this time.  krs/can

## 2013-03-06 ENCOUNTER — Other Ambulatory Visit: Payer: Self-pay

## 2013-03-22 ENCOUNTER — Ambulatory Visit (INDEPENDENT_AMBULATORY_CARE_PROVIDER_SITE_OTHER): Payer: Medicare Other | Admitting: Family Medicine

## 2013-03-22 ENCOUNTER — Encounter: Payer: Self-pay | Admitting: Family Medicine

## 2013-03-22 VITALS — BP 110/62 | HR 68 | Temp 98.6°F | Wt 119.0 lb

## 2013-03-22 DIAGNOSIS — L299 Pruritus, unspecified: Secondary | ICD-10-CM

## 2013-03-22 MED ORDER — PREDNISONE 10 MG PO TABS: 10.0000 mg | ORAL_TABLET | Freq: Every day | ORAL | Status: DC

## 2013-03-22 NOTE — Progress Notes (Signed)
Pre visit review using our clinic review tool, if applicable. No additional management support is needed unless otherwise documented below in the visit note. 

## 2013-03-22 NOTE — Progress Notes (Signed)
  Subjective:    Patient ID: Heidi Moore, female    DOB: May 20, 1919, 79 y.o.   MRN: [id number]  HPI Here for 2 weeks of rashes and itching over the trunk. She is not on any new medications. She has never had this before. Benadryl gives her some relief for a few hours.    Review of Systems  Constitutional: Negative.   Skin: Positive for rash.       Objective:   Physical Exam  Constitutional: She appears well-developed and well-nourished.  Skin:  Scattered faint red macules or wheals over the trunk           Assessment & Plan:  Pruritis of uncertain etiology. Try Claritin 10 mg bid for a few weeks and add Prednisone 10 mg daily for 2 weeks. Use Aveeno in the bath tub.

## 2013-03-23 ENCOUNTER — Encounter: Payer: Self-pay | Admitting: Family Medicine

## 2013-03-23 NOTE — Progress Notes (Signed)
Received office notes from [facility] Specialist from visit on 03/21/2013.  Pt to start premarin 0.635 vaginal crea; one gram 3x week x 4 weeks then once weekly.  Follow in 6 months.  Sent to Dr. Selena Batten to initial and then to scan.

## 2013-04-25 IMAGING — CR DG CHEST 2V
2 series · 2 of 2 positions shown · non-contrast
Comparison: None.

CLINICAL DATA: Preoperative respiratory evaluation for knee
replacement

CHEST - 2 VIEW

[view not recorded (1 of 2)]
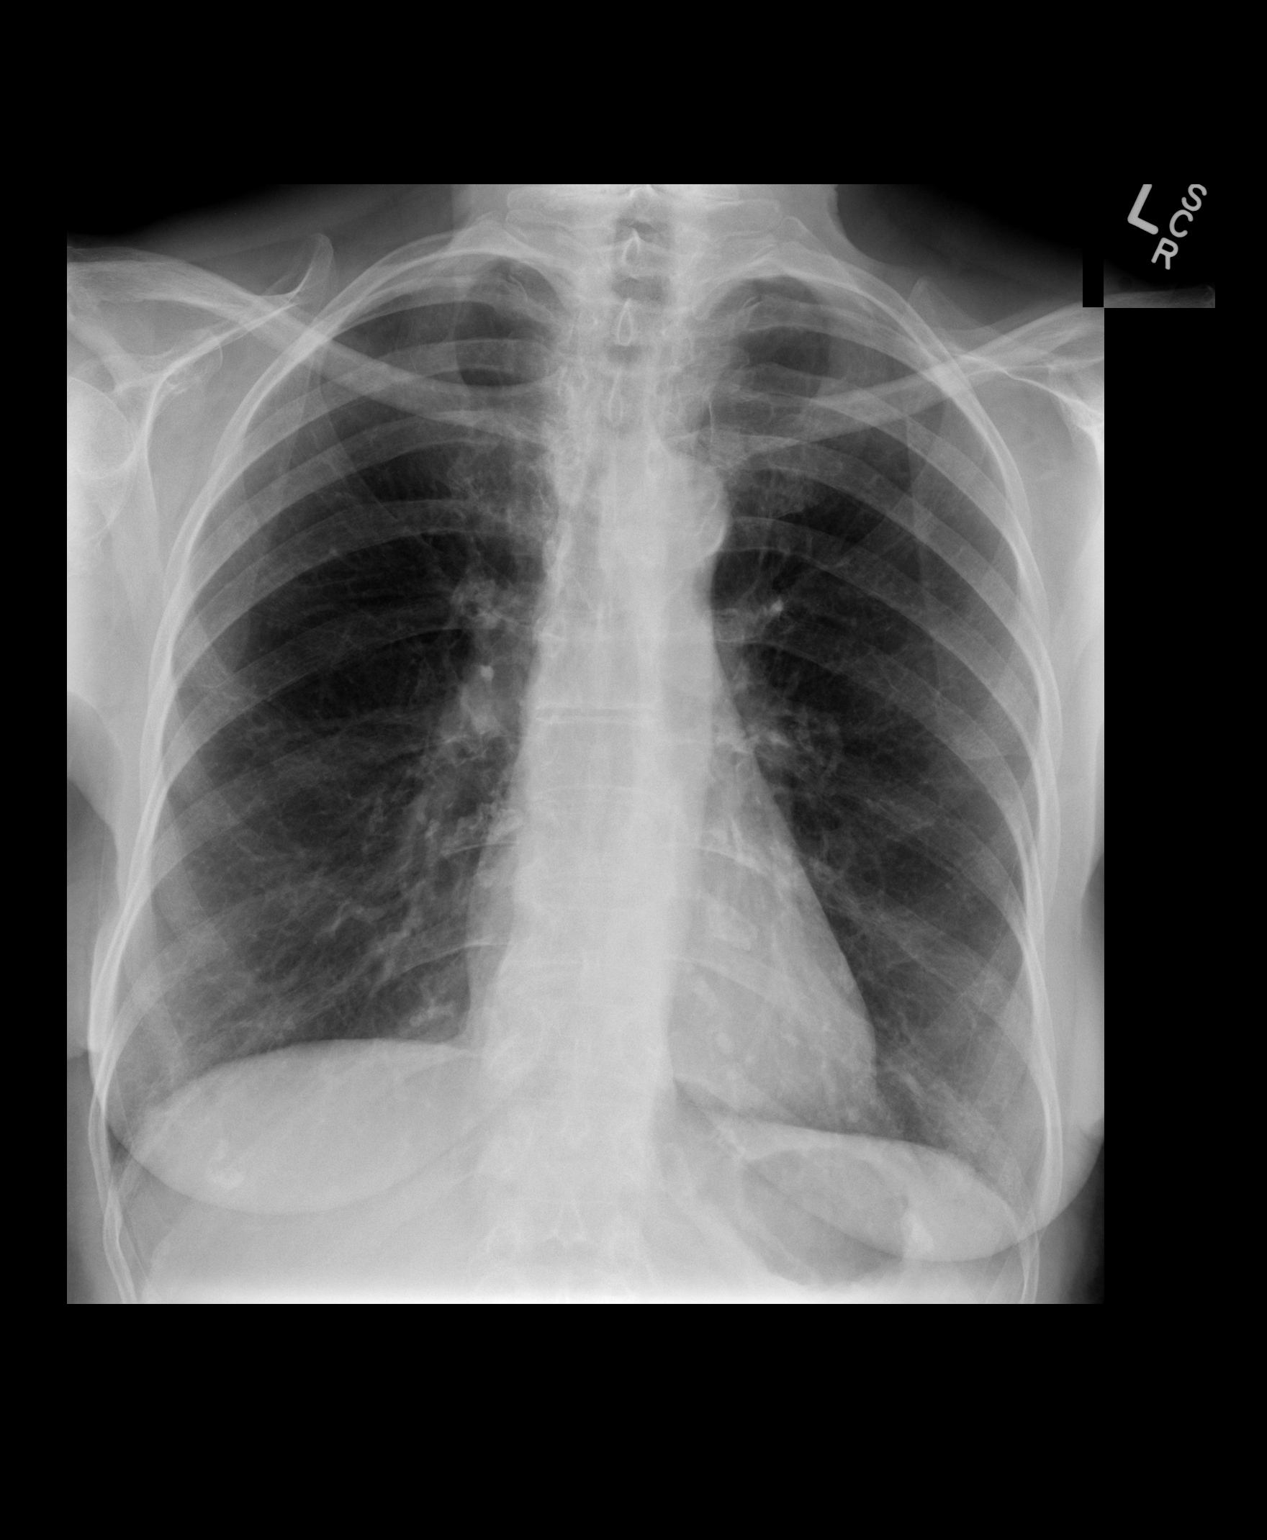

[view not recorded (2 of 2)]
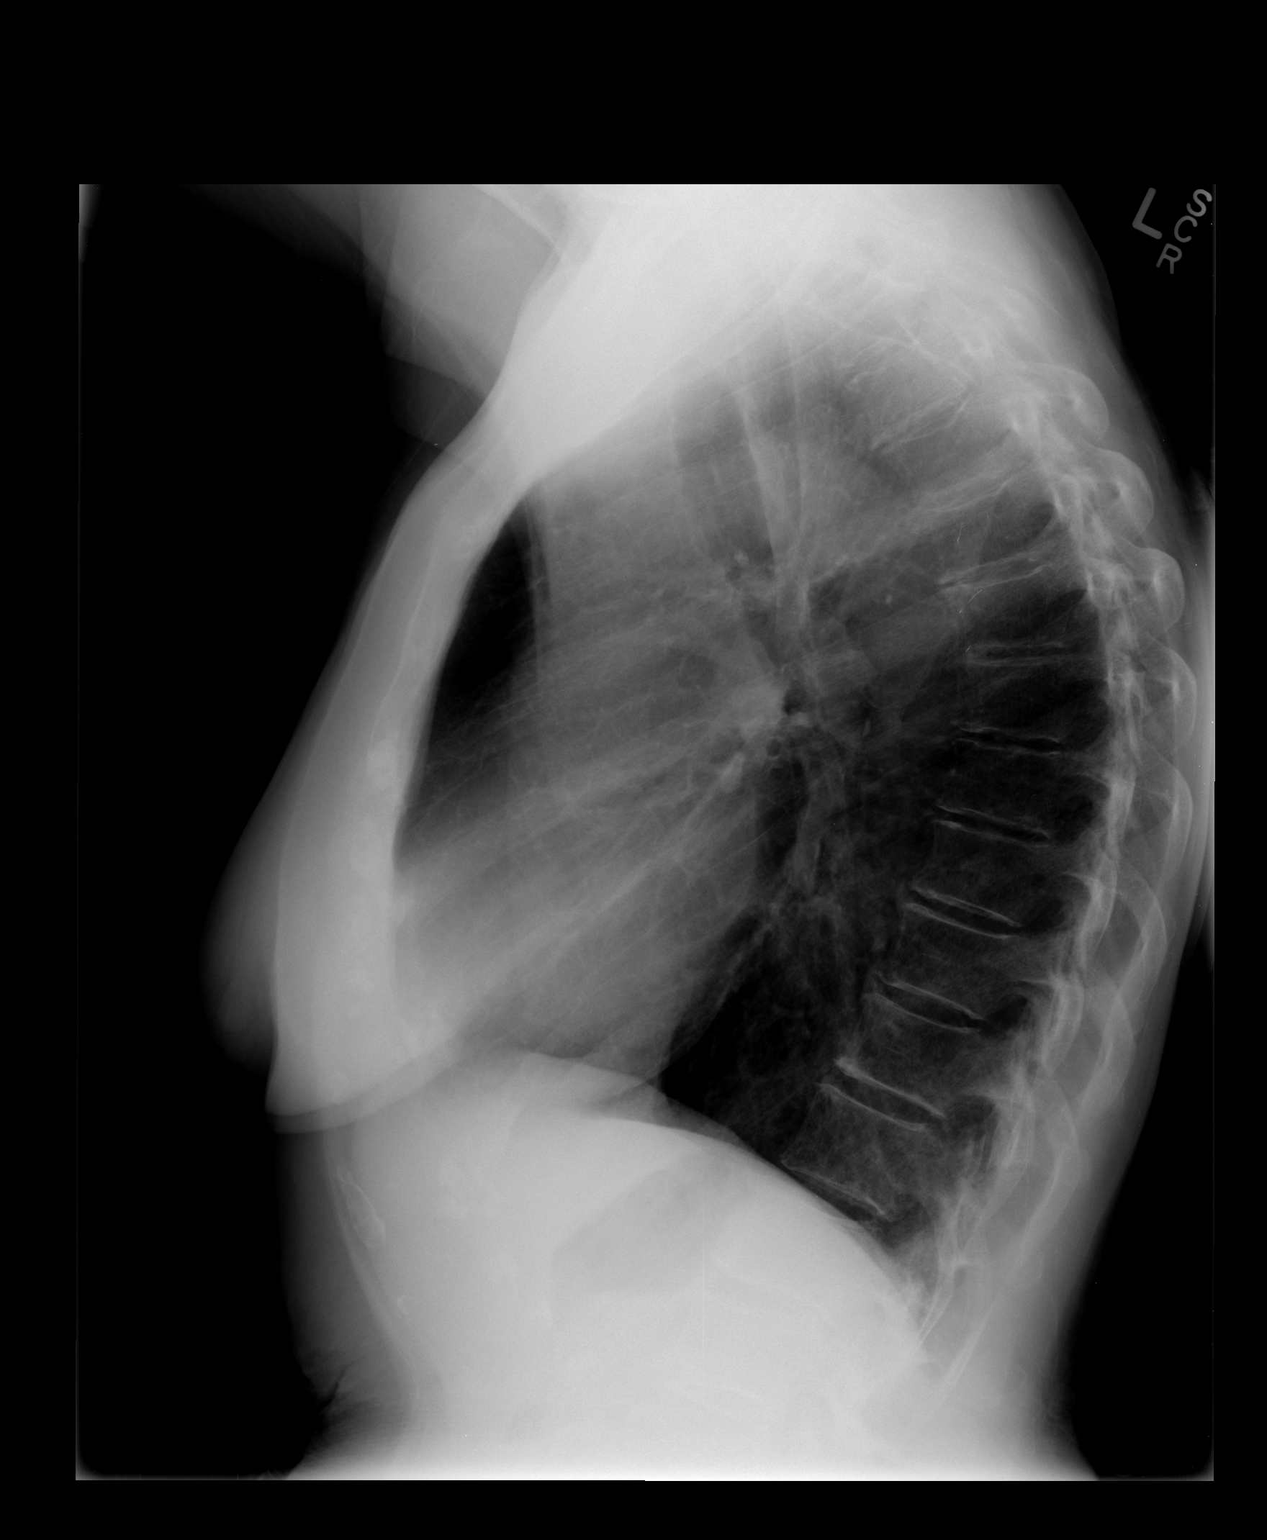

[2 of 2 positions shown; findings below may reference images not displayed]

FINDINGS: The lungs are clear without focal infiltrate, edema,
pneumothorax or pleural effusion. The cardiopericardial silhouette
is within normal limits for size. Imaged bony structures of the
thorax are intact.  Hyperexpansion suggests emphysema.
IMPRESSION: Emphysema without acute cardiopulmonary process.

## 2013-06-06 ENCOUNTER — Other Ambulatory Visit: Payer: Self-pay | Admitting: Orthopaedic Surgery

## 2013-06-06 DIAGNOSIS — M25511 Pain in right shoulder: Secondary | ICD-10-CM

## 2013-06-15 ENCOUNTER — Ambulatory Visit
Admission: RE | Admit: 2013-06-15 | Discharge: 2013-06-15 | Disposition: A | Discharge status: Home / Self Care | Payer: Medicare Other | Source: Ambulatory Visit | Attending: Orthopaedic Surgery | Admitting: Orthopaedic Surgery

## 2013-06-15 DIAGNOSIS — M25511 Pain in right shoulder: Secondary | ICD-10-CM

## 2013-06-23 ENCOUNTER — Encounter: Payer: Self-pay | Admitting: Family Medicine

## 2013-06-23 ENCOUNTER — Ambulatory Visit (INDEPENDENT_AMBULATORY_CARE_PROVIDER_SITE_OTHER): Payer: Medicare Other | Admitting: Family Medicine

## 2013-06-23 VITALS — BP 90/58 | Temp 98.6°F | Wt 120.0 lb

## 2013-06-23 DIAGNOSIS — K529 Noninfective gastroenteritis and colitis, unspecified: Secondary | ICD-10-CM

## 2013-06-23 DIAGNOSIS — K5289 Other specified noninfective gastroenteritis and colitis: Secondary | ICD-10-CM

## 2013-06-23 MED ORDER — ONDANSETRON HCL 4 MG PO TABS: 4.0000 mg | ORAL_TABLET | Freq: Three times a day (TID) | ORAL | Status: DC | PRN

## 2013-06-23 NOTE — Patient Instructions (Signed)
-  plenty of fluids with electrolytes (soup, gatorade, etc)  -zofran if needed  -imodium  -no dairy for 7 days  -see a doctor immediatly if worsening or can't drink fluids

## 2013-06-23 NOTE — Progress Notes (Signed)
Pre visit review using our clinic review tool, if applicable. No additional management support is needed unless otherwise documented below in the visit note. 

## 2013-06-23 NOTE — Progress Notes (Signed)
Chief Complaint  Patient presents with  . n/v/d/ gas    HPI:  Acute visit for:  N/V/D: -started: last night, better today - tolerating fluids - vomiting resolved, diarrhea better today -symptoms: nausea, vomiting diarrhea -denies: fevers, blood in stool or vomiting -has tried: nothing  ROS: See pertinent positives and negatives per HPI.  Past Medical History  Diagnosis Date  . PVD (peripheral vascular disease)     No AAA on U/S x 2   . Diverticulosis   . Allergic rhinitis   . Osteopenia     Intolerance to Calcium,Actonel,Fosamax,Evista, Forteo: S/P Boniva 2007 to 03/2010  . History of skin cancer     Basal cell, Dr. Lindwood Coke  . Hyperlipidemia     [place] Study LDL goal =<176   . Complication of anesthesia     headaches    Past Surgical History  Procedure Laterality Date  . Total abdominal hysterectomy w/ bilateral salpingoophorectomy      for Endometriosis   . Colonoscopy      with Tics (no further f/u as per GI due to age)   . Cataract extraction, bilateral    . Nose surgery      due to trama at age 60   . Mohs surgery      right nostril for basel cell cancer   . Bunionectomy  10/12/2008  . Lt knee       menis  . Knee arthroplasty  05/18/2012    Procedure: COMPUTER ASSISTED TOTAL KNEE ARTHROPLASTY;  Surgeon: Marybelle Killings, MD;  Location: [place];  Service: Orthopedics;  Laterality: Left;  Left Total Knee Arthroplasty, Cemented, Computer Assisted    Family History  Problem Relation Age of Onset  . Colon cancer Other     1st Cousin   . Stroke Mother 33  . Diabetes Brother   . Atrial fibrillation Brother     coumadin  . Lung cancer Sister     History   Social History  . Marital Status: Widowed    Spouse Name: N/A    Number of Children: N/A  . Years of Education: N/A   Social History Main Topics  . Smoking status: Never Smoker   . Smokeless tobacco: Never Used  . Alcohol Use: No  . Drug Use: No  . Sexual Activity: None   Other Topics Concern   . 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 For pain, Disp: , Rfl: ;  B Complex-C (B-COMPLEX WITH VITAMIN C) tablet, Take 1 tablet by mouth daily., Disp: , Rfl: ;  beta carotene w/minerals (OCUVITE) tablet, Take 1 tablet by mouth daily., Disp: , Rfl:  calcium carbonate (TUMS - DOSED IN MG ELEMENTAL CALCIUM) 500 MG chewable tablet, Chew 1 tablet by mouth daily., Disp: , Rfl: ;  cholecalciferol (VITAMIN D) 1000 UNITS tablet, Take  1,000 Units by mouth 3 (three) times daily., Disp: , Rfl: ;  estradiol (ESTRACE) 0.5 MG tablet, Take 0.5 mg by mouth daily., Disp: , Rfl:  HYDROcodone-acetaminophen (NORCO/VICODIN) 5-325 MG per tablet, Take 1 tablet by mouth every 6 (six) hours as needed for pain., Disp: , Rfl: ;  Lido-Capsaicin-Men-Methyl Sal (MEDI-PATCH-LIDOCAINE) 0.5-0.035-5-20 % PTCH, Apply topically every 12 (twelve) hours., Disp: , Rfl: ;  Magnesium 250 MG TABS, Take 750 mg by mouth at bedtime., Disp: , Rfl: ;  Multiple Vitamin (MULTIVITAMIN WITH MINERALS) TABS, Take 1 tablet by mouth daily., Disp: , Rfl:  OVER THE COUNTER MEDICATION, [facility]- Arthro -7 daily., Disp: , Rfl: ;  predniSONE (DELTASONE) 10 MG tablet, Take 1 tablet (10 mg total) by mouth daily., Disp: 30 tablet, Rfl: 0;  vitamin C (ASCORBIC ACID) 500 MG tablet, Take 500 mg by mouth 2 (two) times daily., Disp: , Rfl: ;  vitamin E (VITAMIN E) 400 UNIT capsule, Take 400 Units by mouth daily., Disp:  , Rfl: ;  metroNIDAZOLE (METROGEL) 1 % gel, Apply topically daily., Disp: 60 g, Rfl: 11 ondansetron (ZOFRAN) 4 MG tablet, Take 1 tablet (4 mg total) by mouth every 8 (eight) hours as needed for nausea or vomiting., Disp: 20 tablet, Rfl: 0  EXAM:  Filed Vitals:   06/23/13 1413  BP: 90/58  Temp: 98.6 F (37 C)    Body mass index is 21.94 kg/(m^2).  GENERAL: vitals reviewed and listed above, alert, oriented, appears well hydrated and in no acute distress  HEENT: atraumatic, conjunttiva clear, no obvious abnormalities on inspection of external nose and ears  NECK: no obvious masses on inspection  LUNGS: clear to auscultation bilaterally, no wheezes, rales or rhonchi, good air movement  CV: HRRR, no peripheral edema  ABD:BS+, soft, NTTP  MS: moves all extremities without noticeable abnormality  PSYCH: pleasant and cooperative, no obvious depression or anxiety  ASSESSMENT AND PLAN:  Discussed the following assessment and plan:  Gastroenteritis - Plan: ondansetron (ZOFRAN) 4 MG tablet  -better today, apears well on exam, tolerating fluids  -oral rehydration, zofran, imodium, return and ed precautions -Patient advised to return or notify a doctor immediately if symptoms worsen or persist or new concerns arise.  Patient Instructions  -plenty of fluids with electrolytes (soup, gatorade, etc)  -zofran if needed  -imodium  -no dairy for 7 days  -see a doctor immediatly if worsening or can't drink fluids     Piper Albro R.

## 2013-08-22 ENCOUNTER — Other Ambulatory Visit (HOSPITAL_COMMUNITY): Payer: Self-pay | Admitting: Orthopaedic Surgery

## 2013-08-29 ENCOUNTER — Encounter (HOSPITAL_COMMUNITY): Payer: Self-pay | Admitting: Pharmacy Technician

## 2013-09-04 NOTE — Pre-Procedure Instructions (Signed)
Heidi Moore  09/04/2013   Your procedure is scheduled on:  Mon, May 4 @ 12:30 PM  Report to [facility] Entrance A  at 10:30 AM.  Call this number if you have problems the morning of surgery: [phone]   Remember:   Do not eat food or drink liquids after midnight.                Stop taking your Vit E. No Goody's,BC's,Aleve,Aspirin,Ibuprofen,Fish Oil,or any Herbal Medications   Do not wear jewelry, make-up or nail polish.  Do not wear lotions, powders, or perfumes. You may wear deodorant.  Do not shave 48 hours prior to surgery.   Do not bring valuables to the hospital.  [facility] is not responsible                  for any belongings or valuables.               Contacts, dentures or bridgework may not be worn into surgery.  Leave suitcase in the car. After surgery it may be brought to your room.  For patients admitted to the hospital, discharge time is determined by your                treatment team.                  Special Instructions:  [facility] - Preparing for Surgery  Before surgery, you can play an important role.  Because skin is not sterile, your skin needs to be as free of germs as possible.  You can reduce the number of germs on you skin by washing with CHG (chlorahexidine gluconate) soap before surgery.  CHG is an antiseptic cleaner which kills germs and bonds with the skin to continue killing germs even after washing.  Please DO NOT use if you have an allergy to CHG or antibacterial soaps.  If your skin becomes reddened/irritated stop using the CHG and inform your nurse when you arrive at Short Stay.  Do not shave (including legs and underarms) for at least 48 hours prior to the first CHG shower.  You may shave your face.  Please follow these instructions carefully:   1.  Shower with CHG Soap the night before surgery and the                                morning of Surgery.  2.  If you choose to wash your hair, wash your hair first as usual with your        normal shampoo.  3.  After you shampoo, rinse your hair and body thoroughly to remove the                      Shampoo.  4.  Use CHG as you would any other liquid soap.  You can apply chg directly       to the skin and wash gently with scrungie or a clean washcloth.  5.  Apply the CHG Soap to your body ONLY FROM THE NECK DOWN.        Do not use on open wounds or open sores.  Avoid contact with your eyes,       ears, mouth and genitals (private parts).  Wash genitals (private parts)       with your normal soap.  6.  Wash thoroughly, paying special attention to  the area where your surgery        will be performed.  7.  Thoroughly rinse your body with warm water from the neck down.  8.  DO NOT shower/wash with your normal soap after using and rinsing off       the CHG Soap.  9.  Pat yourself dry with a clean towel.            10.  Wear clean pajamas.            11.  Place clean sheets on your bed the night of your first shower and do not        sleep with pets.  Day of Surgery  Do not apply any lotions/deoderants the morning of surgery.  Please wear clean clothes to the hospital/surgery center.     Please read over the following fact sheets that you were given: Pain Booklet, Coughing and Deep Breathing, Blood Transfusion Information and Surgical Site Infection Prevention

## 2013-09-05 ENCOUNTER — Encounter (HOSPITAL_COMMUNITY): Payer: Self-pay

## 2013-09-05 ENCOUNTER — Encounter (HOSPITAL_COMMUNITY)
Admission: RE | Admit: 2013-09-05 | Discharge: 2013-09-05 | Disposition: A | Discharge status: Home / Self Care | Payer: Medicare Other | Source: Ambulatory Visit | Attending: Orthopaedic Surgery | Admitting: Orthopaedic Surgery

## 2013-09-05 DIAGNOSIS — J438 Other emphysema: Secondary | ICD-10-CM | POA: Insufficient documentation

## 2013-09-05 DIAGNOSIS — Z01812 Encounter for preprocedural laboratory examination: Secondary | ICD-10-CM | POA: Insufficient documentation

## 2013-09-05 DIAGNOSIS — Z01818 Encounter for other preprocedural examination: Secondary | ICD-10-CM | POA: Insufficient documentation

## 2013-09-05 DIAGNOSIS — Z01810 Encounter for preprocedural cardiovascular examination: Secondary | ICD-10-CM | POA: Insufficient documentation

## 2013-09-05 HISTORY — DX: Headache: R51

## 2013-09-05 HISTORY — DX: Hypotension, unspecified: I95.9

## 2013-09-05 HISTORY — DX: Nausea with vomiting, unspecified: R11.2

## 2013-09-05 HISTORY — DX: Frequency of micturition: R35.0

## 2013-09-05 HISTORY — DX: Nonexudative age-related macular degeneration, unspecified eye, stage unspecified: H35.3190

## 2013-09-05 HISTORY — DX: Other shoulder lesions, unspecified shoulder: M75.80

## 2013-09-05 HISTORY — DX: Effusion, unspecified joint: M25.40

## 2013-09-05 HISTORY — DX: Bursopathy, unspecified: M71.9

## 2013-09-05 HISTORY — DX: Unspecified osteoarthritis, unspecified site: M19.90

## 2013-09-05 HISTORY — DX: Personal history of urinary (tract) infections: Z87.440

## 2013-09-05 HISTORY — DX: Age-related osteoporosis without current pathological fracture: M81.0

## 2013-09-05 HISTORY — DX: Pain in unspecified joint: M25.50

## 2013-09-05 HISTORY — DX: Personal history of other medical treatment: Z92.89

## 2013-09-05 HISTORY — DX: Other specified postprocedural states: Z98.890

## 2013-09-05 LAB — URINALYSIS, ROUTINE W REFLEX MICROSCOPIC
BILIRUBIN URINE: NEGATIVE
Glucose, UA: NEGATIVE mg/dL
Hgb urine dipstick: NEGATIVE
Ketones, ur: NEGATIVE mg/dL
LEUKOCYTES UA: NEGATIVE
Nitrite: NEGATIVE
PH: 5 (ref 5.0–8.0)
Protein, ur: NEGATIVE mg/dL
SPECIFIC GRAVITY, URINE: 1.02 (ref 1.005–1.030)
UROBILINOGEN UA: 0.2 mg/dL (ref 0.0–1.0)

## 2013-09-05 LAB — COMPREHENSIVE METABOLIC PANEL
ALT: 17 U/L (ref 0–35)
AST: 19 U/L (ref 0–37)
Albumin: 3.9 g/dL (ref 3.5–5.2)
Alkaline Phosphatase: 63 U/L (ref 39–117)
BILIRUBIN TOTAL: 0.5 mg/dL (ref 0.3–1.2)
BUN: 12 mg/dL (ref 6–23)
CHLORIDE: 106 meq/L (ref 96–112)
CO2: 25 mEq/L (ref 19–32)
Calcium: 10 mg/dL (ref 8.4–10.5)
Creatinine, Ser: 0.85 mg/dL (ref 0.50–1.10)
GFR calc Af Amer: 67 mL/min — ABNORMAL LOW (ref >90)
GFR calc non Af Amer: 58 mL/min — ABNORMAL LOW (ref >90)
Glucose, Bld: 88 mg/dL (ref 70–99)
POTASSIUM: 4.1 meq/L (ref 3.7–5.3)
Sodium: 144 mEq/L (ref 137–147)
TOTAL PROTEIN: 7.3 g/dL (ref 6.0–8.3)

## 2013-09-05 LAB — TYPE AND SCREEN
ABO/RH(D): O NEG
ANTIBODY SCREEN: NEGATIVE

## 2013-09-05 LAB — CBC
HCT: 43.5 % (ref 36.0–46.0)
Hemoglobin: 14.1 g/dL (ref 12.0–15.0)
MCH: 30.3 pg (ref 26.0–34.0)
MCHC: 32.4 g/dL (ref 30.0–36.0)
MCV: 93.3 fL (ref 78.0–100.0)
PLATELETS: 173 10*3/uL (ref 150–400)
RBC: 4.66 MIL/uL (ref 3.87–5.11)
RDW: 14.4 % (ref 11.5–15.5)
WBC: 6.7 10*3/uL (ref 4.0–10.5)

## 2013-09-05 LAB — PROTIME-INR
INR: 1.05 (ref 0.00–1.49)
Prothrombin Time: 13.5 seconds (ref 11.6–15.2)

## 2013-09-05 LAB — APTT: APTT: 31 s (ref 24–37)

## 2013-09-05 MED ORDER — CHLORHEXIDINE GLUCONATE 4 % EX LIQD: 60.0000 mL | Freq: Once | CUTANEOUS | Status: DC

## 2013-09-05 NOTE — H&P (Signed)
Heidi Moore is an 80 y.o. female.   Chief Complaint: right shoulder pain HPI: Pt with limitations in motion and significant decrease ability for activities of daily living secondary to severe shoulder pain.    Previous x-rays showed large spurs and flattening of the humeral head and glenohumeral erosion.    MRI scan on 06/15/2013 showed severe osteoarthritis, erosion of the glenoid and humeral head with diffuse articular loss, subchondral edema, large humeral head spurs greater than a centimeter.  The biceps tendon was in good position and she had intact supraspinatus, rotator cuff was intact with some mild slight tendinopathy of the subscap.     Pt has had physical therapy and intra articular injections of the right shoulder.  These provide only temporary relief of the symptoms of pain but do not help with her functional limitations.    PLAN:  Despite her age at 6 she states she wants to proceed with total shoulder arthroplasty.  She did well with her knee replacement.  We discussed problems including cardiac problems, problems with stiffness, doing therapy.  She is here with her daughter and wants to go home with her daughter after the surgery.  We would set up some outpatient therapy starting a week after her surgery, at her first postop visit this would be arranged.  Procedure discussed, risks of infection, bleeding, re-operation, nerve damage, humeral shaft fracture all discussed.  Patient agrees and would like to proceed.  Past Medical History  Diagnosis Date  . Diverticulosis   . Allergic rhinitis   . Osteopenia     Intolerance to Calcium,Actonel,Fosamax,Evista, Forteo: S/P Boniva 2007 to 03/2010  . History of skin cancer     Basal cell, Dr. Lindwood Coke  . Hyperlipidemia     [place] Study LDL goal =<762   . Complication of anesthesia     headaches  . Bursitis     right shoulder  . PONV (postoperative nausea and vomiting)   . Hypotension   . Headache(784.0)     sinus HA  .  Arthritis   . Joint pain   . Joint swelling   . Osteoporosis   . Urinary frequency   . History of cystitis   . AC (acromioclavicular) joint bone spurs     on right shoulder   . History of blood transfusion 67yr ago    no abnormal reaction noted  . Macular degeneration, dry     Past Surgical History  Procedure Laterality Date  . Total abdominal hysterectomy w/ bilateral salpingoophorectomy      for Endometriosis   . Colonoscopy      with Tics (no further f/u as per GI due to age)   . Cataract extraction, bilateral    . Nose surgery      due to trama at age [age]  . Mohs surgery      right nostril for basel cell cancer   . Lt knee       meniscus tear  . Knee arthroplasty  05/18/2012    Procedure: COMPUTER ASSISTED TOTAL KNEE ARTHROPLASTY;  Surgeon: MMarybelle Killings MD;  [place]  Service: Orthopedics;  Laterality: Left;  Left Total Knee Arthroplasty, Cemented, Computer Assisted  . Bunionectomy Bilateral   . Appendectomy      Family History  Problem Relation Age of Onset  . Colon cancer Other     1st Cousin   . Stroke Mother 862 . Diabetes Brother   . Atrial fibrillation Brother  coumadin  . Lung cancer Sister    Social History:  reports that she has never smoked. She has never used smokeless tobacco. She reports that she does not drink alcohol or use illicit drugs.  Allergies:  Allergies  Allergen Reactions  . Alendronate Sodium Nausea And Vomiting  . Anaplex Hd Nausea And Vomiting  . Aspirin Nausea And Vomiting  . Azithromycin Nausea And Vomiting  . Calcium Nausea And Vomiting  . Codeine Nausea And Vomiting  . Doxycycline Nausea And Vomiting  . Hydrocodone Nausea And Vomiting  . Ibandronate Sodium     REACTION: severe body aches  . Ibuprofen Nausea And Vomiting  . Penicillins Nausea And Vomiting  . Raloxifene Nausea And Vomiting  . Risedronate Sodium Nausea And Vomiting  . Sulfonamide Derivatives Nausea And Vomiting  . Teriparatide Nausea And Vomiting     No prescriptions prior to admission    Results for orders placed during the hospital encounter of 09/05/13 (from the past 48 hour(s))  URINALYSIS, ROUTINE W REFLEX MICROSCOPIC     Status: None   Collection Time    09/05/13 10:30 AM      Result Value Ref Range   Color, Urine YELLOW  YELLOW   APPearance CLEAR  CLEAR   Specific Gravity, Urine 1.020  1.005 - 1.030   pH 5.0  5.0 - 8.0   Glucose, UA NEGATIVE  NEGATIVE mg/dL   Hgb urine dipstick NEGATIVE  NEGATIVE   Bilirubin Urine NEGATIVE  NEGATIVE   Ketones, ur NEGATIVE  NEGATIVE mg/dL   Protein, ur NEGATIVE  NEGATIVE mg/dL   Urobilinogen, UA 0.2  0.0 - 1.0 mg/dL   Nitrite NEGATIVE  NEGATIVE   Leukocytes, UA NEGATIVE  NEGATIVE   Comment: MICROSCOPIC NOT DONE ON URINES WITH NEGATIVE PROTEIN, BLOOD, LEUKOCYTES, NITRITE, OR GLUCOSE <1000 mg/dL.  APTT     Status: None   Collection Time    09/05/13 10:31 AM      Result Value Ref Range   aPTT 31  24 - 37 seconds  CBC     Status: None   Collection Time    09/05/13 10:31 AM      Result Value Ref Range   WBC 6.7  4.0 - 10.5 K/uL   RBC 4.66  3.87 - 5.11 MIL/uL   Hemoglobin 14.1  12.0 - 15.0 g/dL   HCT 43.5  36.0 - 46.0 %   MCV 93.3  78.0 - 100.0 fL   MCH 30.3  26.0 - 34.0 pg   MCHC 32.4  30.0 - 36.0 g/dL   RDW 14.4  11.5 - 15.5 %   Platelets 173  150 - 400 K/uL  COMPREHENSIVE METABOLIC PANEL     Status: Abnormal   Collection Time    09/05/13 10:31 AM      Result Value Ref Range   Sodium 144  137 - 147 mEq/L   Potassium 4.1  3.7 - 5.3 mEq/L   Chloride 106  96 - 112 mEq/L   CO2 25  19 - 32 mEq/L   Glucose, Bld 88  70 - 99 mg/dL   BUN 12  6 - 23 mg/dL   Creatinine, Ser 0.85  0.50 - 1.10 mg/dL   Calcium 10.0  8.4 - 10.5 mg/dL   Total Protein 7.3  6.0 - 8.3 g/dL   Albumin 3.9  3.5 - 5.2 g/dL   AST 19  0 - 37 U/L   ALT 17  0 - 35 U/L   Alkaline Phosphatase  63  39 - 117 U/L   Total Bilirubin 0.5  0.3 - 1.2 mg/dL   GFR calc non Af Amer 58 (*) >90 mL/min   GFR calc Af Amer 67  (*) >90 mL/min   Comment: (NOTE)     The eGFR has been calculated using the CKD EPI equation.     This calculation has not been validated in all clinical situations.     eGFR's persistently <90 mL/min signify possible Chronic Kidney     Disease.  PROTIME-INR     Status: None   Collection Time    09/05/13 10:31 AM      Result Value Ref Range   Prothrombin Time 13.5  11.6 - 15.2 seconds   INR 1.05  0.00 - 1.49  TYPE AND SCREEN     Status: None   Collection Time    09/05/13 10:40 AM      Result Value Ref Range   ABO/RH(D) O NEG     Antibody Screen NEG     Sample Expiration 09/19/2013     Dg Chest 2 View  09/05/2013   CLINICAL DATA:  Preoperative shoulder arthroplasty  EXAM: CHEST  2 VIEW  COMPARISON:  May 09, 2012  FINDINGS: There is underlying emphysematous change. There is no edema or consolidation. The heart size is normal. The pulmonary vascularity reflects underlying emphysema. There is atherosclerotic change in the aorta. There is mild degenerative change in the thoracic spine.  IMPRESSION: Underlying emphysematous change.  No edema or consolidation.   Electronically Signed   By: Lowella Grip [name]   On: 09/05/2013 13:27    Review of Systems  Constitutional: Negative.   HENT: Negative.   Eyes: Negative.   Respiratory: Negative.   Cardiovascular: Negative.   Gastrointestinal: Negative.   Genitourinary: Negative.   Musculoskeletal: Positive for joint pain.       Shoulder pain right.  Interferes with ADLs  Skin: Negative.   Neurological: Negative.   Endo/Heme/Allergies: Negative.   Psychiatric/Behavioral: Negative.     There were no vitals taken for this visit. Physical Exam  Constitutional: She is oriented to person, place, and time. She appears well-developed and well-nourished.  HENT:  Head: Normocephalic and atraumatic.  Eyes: EOM are normal. Pupils are equal, round, and reactive to light.  Neck: Normal range of motion.  Cardiovascular: Normal rate.    Respiratory: Effort normal.  GI: Soft.  Musculoskeletal:  Limitations with ROM of right shoulder in all planes with severe pain at limits of motion.  Crepitus with motion.  Distal pulse and sensation of UE intact.  Cervical ROM intact.   Sensation and pulses intact UEs.  Neurological: She is alert and oriented to person, place, and time.  Skin: Skin is warm and dry. There is pallor.  Psychiatric: She has a normal mood and affect.     Assessment/Plan Osteoarthritis of right shoulder  PLAN: total shoulder arthroplasty right shoulder.  [name] 09/05/2013, 3:21 PM

## 2013-09-05 NOTE — Progress Notes (Signed)
Pt doesn't have a cardiologist  Denies ever having an echo/stress test/heart cath  Denies EKG or CXR in past yr  Dr.Hannah Maudie Mercury is Medical Md

## 2013-09-08 ENCOUNTER — Encounter: Payer: Medicare Other | Admitting: Family Medicine

## 2013-09-10 MED ORDER — CEFAZOLIN SODIUM-DEXTROSE 2-3 GM-% IV SOLR: 2.0000 g | INTRAVENOUS | Status: DC

## 2013-09-11 ENCOUNTER — Inpatient Hospital Stay (HOSPITAL_COMMUNITY): Payer: Medicare Other | Admitting: Anesthesiology

## 2013-09-11 ENCOUNTER — Inpatient Hospital Stay (HOSPITAL_COMMUNITY)
Admission: RE | Admit: 2013-09-11 | Discharge: 2013-09-13 | DRG: 483 | Disposition: A | Discharge status: Home / Self Care | Payer: Medicare Other | Source: Ambulatory Visit | Attending: Orthopaedic Surgery | Admitting: Orthopaedic Surgery

## 2013-09-11 ENCOUNTER — Encounter (HOSPITAL_COMMUNITY)
Admission: RE | Disposition: A | Discharge status: Home / Self Care | Payer: Self-pay | Source: Ambulatory Visit | Attending: Orthopaedic Surgery

## 2013-09-11 ENCOUNTER — Encounter (HOSPITAL_COMMUNITY): Payer: Medicare Other | Admitting: Anesthesiology

## 2013-09-11 ENCOUNTER — Encounter (HOSPITAL_COMMUNITY): Payer: Self-pay | Admitting: *Deleted

## 2013-09-11 ENCOUNTER — Inpatient Hospital Stay (HOSPITAL_COMMUNITY): Payer: Medicare Other

## 2013-09-11 DIAGNOSIS — Z96659 Presence of unspecified artificial knee joint: Secondary | ICD-10-CM

## 2013-09-11 DIAGNOSIS — M19011 Primary osteoarthritis, right shoulder: Secondary | ICD-10-CM

## 2013-09-11 DIAGNOSIS — E785 Hyperlipidemia, unspecified: Secondary | ICD-10-CM | POA: Diagnosis present

## 2013-09-11 DIAGNOSIS — Z85828 Personal history of other malignant neoplasm of skin: Secondary | ICD-10-CM

## 2013-09-11 DIAGNOSIS — M19019 Primary osteoarthritis, unspecified shoulder: Principal | ICD-10-CM | POA: Diagnosis present

## 2013-09-11 DIAGNOSIS — M810 Age-related osteoporosis without current pathological fracture: Secondary | ICD-10-CM | POA: Diagnosis present

## 2013-09-11 HISTORY — PX: TOTAL SHOULDER ARTHROPLASTY: SHX126

## 2013-09-11 SURGERY — ARTHROPLASTY, SHOULDER, TOTAL
Anesthesia: Regional | Site: Shoulder | Laterality: Right

## 2013-09-11 MED ORDER — SODIUM CHLORIDE 0.9 % IV SOLN: 10.0000 mg | INTRAVENOUS | Status: DC | PRN

## 2013-09-11 MED ORDER — PROPOFOL 10 MG/ML IV BOLUS
INTRAVENOUS | Status: DC | PRN
  Administered 2013-09-11: 100 mg via INTRAVENOUS

## 2013-09-11 MED ORDER — CALCIUM CARBONATE ANTACID 500 MG PO CHEW
1.0000 | CHEWABLE_TABLET | Freq: Every day | ORAL | Status: DC
  Administered 2013-09-11 – 2013-09-13 (×3): 200 mg via ORAL
  Filled 2013-09-11 (×3): qty 1

## 2013-09-11 MED ORDER — SENNOSIDES-DOCUSATE SODIUM 8.6-50 MG PO TABS: 1.0000 | ORAL_TABLET | Freq: Every evening | ORAL | Status: DC | PRN

## 2013-09-11 MED ORDER — LIDO-CAPSAICIN-MEN-METHYL SAL 0.5-0.035-5-20 % EX PTCH: 1.0000 | MEDICATED_PATCH | CUTANEOUS | Status: DC

## 2013-09-11 MED ORDER — ONDANSETRON HCL 4 MG/2ML IJ SOLN
INTRAMUSCULAR | Status: DC | PRN
  Administered 2013-09-11: 4 mg via INTRAVENOUS

## 2013-09-11 MED ORDER — ONDANSETRON HCL 4 MG/2ML IJ SOLN: 4.0000 mg | Freq: Four times a day (QID) | INTRAMUSCULAR | Status: DC | PRN

## 2013-09-11 MED ORDER — OXYCODONE HCL 5 MG/5ML PO SOLN: 5.0000 mg | Freq: Once | ORAL | Status: DC | PRN

## 2013-09-11 MED ORDER — KCL IN DEXTROSE-NACL 20-5-0.45 MEQ/L-%-% IV SOLN
INTRAVENOUS | Status: DC
  Administered 2013-09-11: 20:00:00 via INTRAVENOUS
  Administered 2013-09-12: 1000 mL via INTRAVENOUS
  Filled 2013-09-11 (×4): qty 1000

## 2013-09-11 MED ORDER — SODIUM CHLORIDE 0.9 % IV SOLN
10.0000 mg | INTRAVENOUS | Status: DC | PRN
  Administered 2013-09-11: 40 ug/min via INTRAVENOUS

## 2013-09-11 MED ORDER — METOCLOPRAMIDE HCL 5 MG/ML IJ SOLN: 5.0000 mg | Freq: Three times a day (TID) | INTRAMUSCULAR | Status: DC | PRN

## 2013-09-11 MED ORDER — ROCURONIUM BROMIDE 100 MG/10ML IV SOLN
INTRAVENOUS | Status: DC | PRN
  Administered 2013-09-11: 25 mg via INTRAVENOUS

## 2013-09-11 MED ORDER — LIDOCAINE HCL (CARDIAC) 20 MG/ML IV SOLN
INTRAVENOUS | Status: AC
  Filled 2013-09-11: qty 5

## 2013-09-11 MED ORDER — LACTATED RINGERS IV SOLN
INTRAVENOUS | Status: DC | PRN
  Administered 2013-09-11 (×2): via INTRAVENOUS

## 2013-09-11 MED ORDER — ONDANSETRON HCL 4 MG PO TABS
4.0000 mg | ORAL_TABLET | Freq: Four times a day (QID) | ORAL | Status: DC | PRN
Start: 2013-09-11 — End: 2013-09-13

## 2013-09-11 MED ORDER — PHENYLEPHRINE 40 MCG/ML (10ML) SYRINGE FOR IV PUSH (FOR BLOOD PRESSURE SUPPORT)
PREFILLED_SYRINGE | INTRAVENOUS | Status: AC
  Filled 2013-09-11: qty 10

## 2013-09-11 MED ORDER — METHOCARBAMOL 500 MG PO TABS
500.0000 mg | ORAL_TABLET | Freq: Four times a day (QID) | ORAL | Status: DC | PRN
  Administered 2013-09-12 – 2013-09-13 (×5): 500 mg via ORAL
  Filled 2013-09-11 (×5): qty 1

## 2013-09-11 MED ORDER — ASPIRIN EC 81 MG PO TBEC
81.0000 mg | DELAYED_RELEASE_TABLET | Freq: Every day | ORAL | Status: DC
  Administered 2013-09-11 – 2013-09-13 (×3): 81 mg via ORAL
  Filled 2013-09-11 (×3): qty 1

## 2013-09-11 MED ORDER — FENTANYL CITRATE 0.05 MG/ML IJ SOLN
INTRAMUSCULAR | Status: AC
  Filled 2013-09-11: qty 2

## 2013-09-11 MED ORDER — ROCURONIUM BROMIDE 50 MG/5ML IV SOLN
INTRAVENOUS | Status: AC
  Filled 2013-09-11: qty 1

## 2013-09-11 MED ORDER — FENTANYL CITRATE 0.05 MG/ML IJ SOLN
INTRAMUSCULAR | Status: AC
  Filled 2013-09-11: qty 5

## 2013-09-11 MED ORDER — MENTHOL 3 MG MT LOZG: 1.0000 | LOZENGE | OROMUCOSAL | Status: DC | PRN

## 2013-09-11 MED ORDER — MIDAZOLAM HCL 2 MG/2ML IJ SOLN
INTRAMUSCULAR | Status: AC
  Administered 2013-09-11: 2 mg
  Filled 2013-09-11: qty 2

## 2013-09-11 MED ORDER — PHENOL 1.4 % MT LIQD
1.0000 | OROMUCOSAL | Status: DC | PRN
  Administered 2013-09-11: 1 via OROMUCOSAL
  Filled 2013-09-11: qty 177

## 2013-09-11 MED ORDER — OXYCODONE HCL 5 MG PO TABS: 5.0000 mg | ORAL_TABLET | Freq: Once | ORAL | Status: DC | PRN

## 2013-09-11 MED ORDER — FENTANYL CITRATE 0.05 MG/ML IJ SOLN
INTRAMUSCULAR | Status: DC | PRN
  Administered 2013-09-11 (×2): 50 ug via INTRAVENOUS

## 2013-09-11 MED ORDER — ACETAMINOPHEN 325 MG PO TABS: 325.0000 mg | ORAL_TABLET | ORAL | Status: DC | PRN

## 2013-09-11 MED ORDER — BISACODYL 10 MG RE SUPP: 10.0000 mg | Freq: Every day | RECTAL | Status: DC | PRN

## 2013-09-11 MED ORDER — CEFAZOLIN SODIUM-DEXTROSE 2-3 GM-% IV SOLR
INTRAVENOUS | Status: DC | PRN
  Administered 2013-09-11: 2 g via INTRAVENOUS

## 2013-09-11 MED ORDER — ACETAMINOPHEN 160 MG/5ML PO SOLN
325.0000 mg | ORAL | Status: DC | PRN
  Filled 2013-09-11: qty 20.3

## 2013-09-11 MED ORDER — MORPHINE SULFATE 2 MG/ML IJ SOLN: 1.0000 mg | INTRAMUSCULAR | Status: DC | PRN

## 2013-09-11 MED ORDER — DOCUSATE SODIUM 100 MG PO CAPS
100.0000 mg | ORAL_CAPSULE | Freq: Two times a day (BID) | ORAL | Status: DC
  Administered 2013-09-11 – 2013-09-13 (×4): 100 mg via ORAL
  Filled 2013-09-11 (×5): qty 1

## 2013-09-11 MED ORDER — LACTATED RINGERS IV SOLN
INTRAVENOUS | Status: DC
  Administered 2013-09-11: 50 via INTRAVENOUS

## 2013-09-11 MED ORDER — 0.9 % SODIUM CHLORIDE (POUR BTL) OPTIME
TOPICAL | Status: DC | PRN
  Administered 2013-09-11: 1000 mL

## 2013-09-11 MED ORDER — OXYCODONE-ACETAMINOPHEN 5-325 MG PO TABS
1.0000 | ORAL_TABLET | ORAL | Status: DC | PRN
  Administered 2013-09-11: 1 via ORAL
  Administered 2013-09-12 – 2013-09-13 (×8): 2 via ORAL
  Filled 2013-09-11 (×6): qty 2
  Filled 2013-09-11: qty 1
  Filled 2013-09-11 (×2): qty 2

## 2013-09-11 MED ORDER — BUPIVACAINE HCL (PF) 0.5 % IJ SOLN
INTRAMUSCULAR | Status: DC | PRN
  Administered 2013-09-11: 20 mL via PERINEURAL

## 2013-09-11 MED ORDER — METHOCARBAMOL 1000 MG/10ML IJ SOLN: 500.0000 mg | Freq: Four times a day (QID) | INTRAVENOUS | Status: DC | PRN

## 2013-09-11 MED ORDER — ONDANSETRON HCL 4 MG/2ML IJ SOLN
INTRAMUSCULAR | Status: AC
  Filled 2013-09-11: qty 2

## 2013-09-11 MED ORDER — FENTANYL CITRATE 0.05 MG/ML IJ SOLN: 25.0000 ug | INTRAMUSCULAR | Status: DC | PRN

## 2013-09-11 MED ORDER — BUPIVACAINE-EPINEPHRINE (PF) 0.25% -1:200000 IJ SOLN
INTRAMUSCULAR | Status: AC
  Filled 2013-09-11: qty 30

## 2013-09-11 MED ORDER — PROPOFOL 10 MG/ML IV BOLUS
INTRAVENOUS | Status: AC
  Filled 2013-09-11: qty 20

## 2013-09-11 MED ORDER — FLEET ENEMA 7-19 GM/118ML RE ENEM: 1.0000 | ENEMA | Freq: Once | RECTAL | Status: AC | PRN

## 2013-09-11 MED ORDER — ACETAMINOPHEN 325 MG PO TABS: 650.0000 mg | ORAL_TABLET | Freq: Four times a day (QID) | ORAL | Status: DC | PRN

## 2013-09-11 MED ORDER — GLYCOPYRROLATE 0.2 MG/ML IJ SOLN
INTRAMUSCULAR | Status: DC | PRN
Start: 2013-09-11 — End: 2013-09-11
  Administered 2013-09-11: 0.2 mg via INTRAVENOUS

## 2013-09-11 MED ORDER — METRONIDAZOLE 1 % EX GEL
Freq: Every day | CUTANEOUS | Status: DC
  Filled 2013-09-11 (×10): qty 1

## 2013-09-11 MED ORDER — OXYCODONE-ACETAMINOPHEN 5-325 MG PO TABS: 1.0000 | ORAL_TABLET | ORAL | Status: DC | PRN

## 2013-09-11 MED ORDER — ACETAMINOPHEN 650 MG RE SUPP: 650.0000 mg | Freq: Four times a day (QID) | RECTAL | Status: DC | PRN

## 2013-09-11 MED ORDER — METOCLOPRAMIDE HCL 10 MG PO TABS: 5.0000 mg | ORAL_TABLET | Freq: Three times a day (TID) | ORAL | Status: DC | PRN

## 2013-09-11 SURGICAL SUPPLY — 66 items
BLADE 10 SAFETY STRL DISP (BLADE) ×3 IMPLANT
BLADE SAW SAG 73X25 THK (BLADE) ×2
BLADE SAW SGTL 73X25 THK (BLADE) ×1 IMPLANT
BOWL SMART MIX CTS (DISPOSABLE) ×3 IMPLANT
CEMENT BONE DEPUY (Cement) ×6 IMPLANT
CLOSURE WOUND 1/2 X4 (GAUZE/BANDAGES/DRESSINGS)
COVER SURGICAL LIGHT HANDLE (MISCELLANEOUS) ×3 IMPLANT
DERMABOND ADVANCED (GAUZE/BANDAGES/DRESSINGS) ×2
DERMABOND ADVANCED .7 DNX12 (GAUZE/BANDAGES/DRESSINGS) ×1 IMPLANT
DRAPE C-ARM 42X72 X-RAY (DRAPES) ×3 IMPLANT
DRAPE INCISE IOBAN 66X45 STRL (DRAPES) ×3 IMPLANT
DRAPE U-SHAPE 47X51 STRL (DRAPES) ×3 IMPLANT
DRSG ADAPTIC 3X8 NADH LF (GAUZE/BANDAGES/DRESSINGS) IMPLANT
DRSG MEPILEX BORDER 4X8 (GAUZE/BANDAGES/DRESSINGS) ×3 IMPLANT
DRSG PAD ABDOMINAL 8X10 ST (GAUZE/BANDAGES/DRESSINGS) IMPLANT
DURAPREP 26ML APPLICATOR (WOUND CARE) ×3 IMPLANT
ELECT REM PT RETURN 9FT ADLT (ELECTROSURGICAL) ×3
ELECTRODE REM PT RTRN 9FT ADLT (ELECTROSURGICAL) ×1 IMPLANT
EVACUATOR 1/8 PVC DRAIN (DRAIN) IMPLANT
GLENOID ANCHOR PEG CROSSLK 40 (Orthopedic Implant) ×3 IMPLANT
GLOVE BIOGEL PI IND STRL 6.5 (GLOVE) ×2 IMPLANT
GLOVE BIOGEL PI IND STRL 7.5 (GLOVE) ×1 IMPLANT
GLOVE BIOGEL PI IND STRL 8 (GLOVE) ×1 IMPLANT
GLOVE BIOGEL PI INDICATOR 6.5 (GLOVE) ×4
GLOVE BIOGEL PI INDICATOR 7.5 (GLOVE) ×2
GLOVE BIOGEL PI INDICATOR 8 (GLOVE) ×2
GLOVE ECLIPSE 7.0 STRL STRAW (GLOVE) ×3 IMPLANT
GLOVE ORTHO TXT STRL SZ7.5 (GLOVE) ×3 IMPLANT
GLOVE SURG SS PI 6.0 STRL IVOR (GLOVE) ×6 IMPLANT
GOWN STRL REUS W/ TWL XL LVL3 (GOWN DISPOSABLE) ×2 IMPLANT
GOWN STRL REUS W/TWL XL LVL3 (GOWN DISPOSABLE) ×4
HEAD HUM STD 44X18 STRL (Trauma) ×3 IMPLANT
HUMERAL STEM 8MM (Trauma) ×3 IMPLANT
KIT BASIN OR (CUSTOM PROCEDURE TRAY) ×3 IMPLANT
KIT ROOM TURNOVER OR (KITS) ×3 IMPLANT
MANIFOLD NEPTUNE II (INSTRUMENTS) ×3 IMPLANT
NDL SUT 6 .5 CRC .975X.05 MAYO (NEEDLE) ×1 IMPLANT
NEEDLE HYPO 25GX1X1/2 BEV (NEEDLE) IMPLANT
NEEDLE MAYO TAPER (NEEDLE) ×2
NS IRRIG 1000ML POUR BTL (IV SOLUTION) ×3 IMPLANT
PACK SHOULDER (CUSTOM PROCEDURE TRAY) ×3 IMPLANT
PAD ARMBOARD 7.5X6 YLW CONV (MISCELLANEOUS) ×6 IMPLANT
PASSER SUT SWANSON 36MM LOOP (INSTRUMENTS) IMPLANT
PIN METAGLENE 2.5 (PIN) ×3 IMPLANT
SLING ARM IMMOBILIZER MED (SOFTGOODS) ×3 IMPLANT
SMARTMIX MINI TOWER (MISCELLANEOUS) ×3
SPONGE GAUZE 4X4 12PLY (GAUZE/BANDAGES/DRESSINGS) ×3 IMPLANT
SPONGE LAP 18X18 X RAY DECT (DISPOSABLE) IMPLANT
SPONGE LAP 4X18 X RAY DECT (DISPOSABLE) ×3 IMPLANT
STAPLER VISISTAT 35W (STAPLE) IMPLANT
STEM HUMERAL 8MM (Trauma) ×1 IMPLANT
STRIP CLOSURE SKIN 1/2X4 (GAUZE/BANDAGES/DRESSINGS) IMPLANT
SUCTION FRAZIER TIP 10 FR DISP (SUCTIONS) ×3 IMPLANT
SUT FIBERWIRE #2 38 T-5 BLUE (SUTURE) ×9
SUT VIC AB 0 CT1 27 (SUTURE) ×2
SUT VIC AB 0 CT1 27XBRD ANBCTR (SUTURE) ×1 IMPLANT
SUT VIC AB 2-0 CT1 27 (SUTURE) ×2
SUT VIC AB 2-0 CT1 TAPERPNT 27 (SUTURE) ×1 IMPLANT
SUT VICRYL 4-0 PS2 18IN ABS (SUTURE) ×3 IMPLANT
SUTURE FIBERWR #2 38 T-5 BLUE (SUTURE) ×3 IMPLANT
SYR CONTROL 10ML LL (SYRINGE) IMPLANT
TOWEL OR 17X24 6PK STRL BLUE (TOWEL DISPOSABLE) ×3 IMPLANT
TOWEL OR 17X26 10 PK STRL BLUE (TOWEL DISPOSABLE) ×3 IMPLANT
TOWER SMARTMIX MINI (MISCELLANEOUS) ×1 IMPLANT
TRAY FOLEY CATH 16FRSI W/METER (SET/KITS/TRAYS/PACK) ×3 IMPLANT
WATER STERILE IRR 1000ML POUR (IV SOLUTION) ×3 IMPLANT

## 2013-09-11 NOTE — Transfer of Care (Signed)
Immediate Anesthesia Transfer of Care Note  Patient: Heidi Moore  Procedure(s) Performed: Procedure(s) with comments: TOTAL SHOULDER ARTHROPLASTY (Right) - Right Total Shoulder Arthroplasty  Patient Location: PACU  Anesthesia Type:General  Level of Consciousness: lethargic and responds to stimulation  Airway & Oxygen Therapy: Patient Spontanous Breathing and Patient connected to nasal cannula oxygen  Post-op Assessment: Post -op Vital signs reviewed and stable  Post vital signs: Reviewed and stable  Complications: No apparent anesthesia complications

## 2013-09-11 NOTE — OR Nursing (Signed)
Patient wears yellow metal band style ring  and yellow metal ring with a red appearing center. Ring with center was able to be removed by Dr Lorin [name]. The band style ring was not able to be removed; cut by Dr Lorin [name] with pin cutter. Placed in bag with patient label and affixed to patient chart. To PACU with patient.

## 2013-09-11 NOTE — Anesthesia Preprocedure Evaluation (Addendum)
Anesthesia Evaluation  Patient identified by MRN, date of birth, ID band Patient awake    Reviewed: Allergy & Precautions, H&P , NPO status , Patient's Chart, lab work & pertinent test results  History of Anesthesia Complications (+) PONV and history of anesthetic complications  Airway Mallampati: II TM Distance: >3 FB Neck ROM: Full    Dental  (+) Teeth Intact, Dental Advisory Given   Pulmonary neg sleep apnea, neg COPD breath sounds clear to auscultation        Cardiovascular negative cardio ROS  Rhythm:Regular     Neuro/Psych  Headaches, negative psych ROS   GI/Hepatic negative GI ROS, Neg liver ROS,   Endo/Other  negative endocrine ROS  Renal/GU negative Renal ROS     Musculoskeletal  (+) Arthritis -,   Abdominal   Peds  Hematology negative hematology ROS (+)   Anesthesia Other Findings   Reproductive/Obstetrics                          Anesthesia Physical Anesthesia Plan  ASA: II  Anesthesia Plan: General and Regional   Post-op Pain Management:    Induction: Intravenous  Airway Management Planned: Oral ETT  Additional Equipment: None  Intra-op Plan:   Post-operative Plan: Extubation in OR  Informed Consent: I have reviewed the patients History and Physical, chart, labs and discussed the procedure including the risks, benefits and alternatives for the proposed anesthesia with the patient or authorized representative who has indicated his/her understanding and acceptance.   Dental advisory given  Plan Discussed with: CRNA, Surgeon and Anesthesiologist  Anesthesia Plan Comments:        Anesthesia Quick Evaluation

## 2013-09-11 NOTE — Anesthesia Procedure Notes (Addendum)
Anesthesia Regional Block:  Interscalene brachial plexus block  Pre-Anesthetic Checklist: ,, timeout performed, Correct Patient, Correct Site, Correct Laterality, Correct Procedure, Correct Position, site marked, Risks and benefits discussed,  Surgical consent,  Pre-op evaluation,  At surgeon's request and post-op pain management  Laterality: Upper and Right  Prep: chloraprep       Needles:  Injection technique: Single-shot  Needle Type: Echogenic Stimulator Needle          Additional Needles:  Procedures: ultrasound guided (picture in chart) and nerve stimulator Interscalene brachial plexus block  Nerve Stimulator or Paresthesia:  Response: deltoid, 0.4 mA,   Additional Responses:   Narrative:  Start time: 09/11/2013 12:57 PM End time: 09/11/2013 1:03 PM Injection made incrementally with aspirations every 5 mL.  Performed by: Personally  Anesthesiologist: Moser  Additional Notes: H+P and labs reviewed, risks and benefits discussed with patient, procedure tolerated well without complications   Procedure Name: Intubation Date/Time: 09/11/2013 2:20 PM Performed by: Carney Living Pre-anesthesia Checklist: Patient identified, Suction available, Emergency Drugs available, Patient being monitored and Timeout performed Patient Re-evaluated:Patient Re-evaluated prior to inductionOxygen Delivery Method: Circle system utilized Preoxygenation: Pre-oxygenation with 100% oxygen Intubation Type: IV induction Laryngoscope Size: Mac and 4 Grade View: Grade II Tube type: Oral Tube size: 7.0 mm Number of attempts: 1 Airway Equipment and Method: Stylet Placement Confirmation: ETT inserted through vocal cords under direct vision,  positive ETCO2 and breath sounds checked- equal and bilateral Secured at: 21 cm Tube secured with: Tape Dental Injury: Teeth and Oropharynx as per pre-operative assessment

## 2013-09-11 NOTE — Anesthesia Postprocedure Evaluation (Signed)
Anesthesia Post Note  Patient: Heidi Moore  Procedure(s) Performed: Procedure(s) (LRB): TOTAL SHOULDER ARTHROPLASTY (Right)  Anesthesia type: general  Patient location: PACU  Post pain: Pain level controlled  Post assessment: Patient's Cardiovascular Status Stable  Last Vitals:  Filed Vitals:   09/11/13 1733  BP: 136/67  Pulse: 70  Temp:   Resp: 15    Post vital signs: Reviewed and stable  Level of consciousness: sedated  Complications: No apparent anesthesia complications

## 2013-09-11 NOTE — Brief Op Note (Cosign Needed)
09/11/2013  4:38 PM  PATIENT:  [name]  80 y.o. female  PRE-OPERATIVE DIAGNOSIS:  Right Shoulder Osteoarthritis  POST-OPERATIVE DIAGNOSIS:  Right Shoulder Osteoarthritis  PROCEDURE:  Procedure(s) with comments: TOTAL SHOULDER ARTHROPLASTY (Right) - Right Total Shoulder Arthroplasty  SURGEON:  Surgeon(s) and Role:    * Marybelle Killings, MD - Primary  PHYSICIAN ASSISTANT:Marcio Hoque PAC   ASSISTANTS: none   ANESTHESIA:   general  EBL:  Total I/O In: 1000 [I.V.:1000] Out: 425 [Urine:325; Blood:100]  BLOOD ADMINISTERED:none  DRAINS: none   LOCAL MEDICATIONS USED:  NONE  SPECIMEN:  No Specimen  DISPOSITION OF SPECIMEN:  N/A  COUNTS:  YES  TOURNIQUET:  * No tourniquets in log *  DICTATION: .Note written in EPIC  PLAN OF CARE: Admit to inpatient   PATIENT DISPOSITION:  PACU - hemodynamically stable.   Delay start of Pharmacological VTE agent (>24hrs) due to surgical blood loss or risk of bleeding: yes

## 2013-09-11 NOTE — Progress Notes (Signed)
Patient ID: Heidi Moore, female   DOB: 04-30-1920, 80 y.o.   MRN: [id number] Comfortable post op due to pre-op block.   Foley catheter causing some burning but she states right now she doesn't want it out.  Will remove in AM.

## 2013-09-12 ENCOUNTER — Encounter (HOSPITAL_COMMUNITY): Payer: Self-pay | Admitting: Orthopaedic Surgery

## 2013-09-12 MED ORDER — METRONIDAZOLE 0.75 % EX GEL
Freq: Every day | CUTANEOUS | Status: DC
  Administered 2013-09-13: 10:00:00 via TOPICAL
  Filled 2013-09-12: qty 45

## 2013-09-12 NOTE — Op Note (Signed)
Heidi Moore, KEN                 ACCOUNT NO.:  [id number]  MEDICAL RECORD NO.:  [id number]  LOCATION:  [id number]                        FACILITY:  [facility]  PHYSICIAN:  Heidi Moore, [name]    DATE OF BIRTH:  1920/01/31  DATE OF PROCEDURE:  09/11/2013 DATE OF DISCHARGE:                              OPERATIVE REPORT   PREOPERATIVE DIAGNOSIS:  Right shoulder osteoarthritis.  POSTOPERATIVE DIAGNOSIS:  Right shoulder osteoarthritis.  PROCEDURE:  Right total shoulder arthroplasty.  DePuy 40 mm, central PEG plus 3 peripheral PEG, all poly glenoid __________ 18 head with an 8-mm stem.  SURGEON:  Heidi Moore, [name]  ASSISTANT:  [name], [name], medically necessary and present for the entire procedure.  ANESTHESIA:  General.  DRAINS:  None.  COMPLICATIONS:  None.  DESCRIPTION OF PROCEDURE:  After induction of general anesthesia and orotracheal intubation, the patient was placed in beach-chair position. Preoperative Ancef prophylaxis, standard prepping and draping, impervious stockinette, sterile skin marker, and Coban.  Time-out procedure was completed.  Deltopectoral incision was made.  Cephalic vein was taken with the arm.  Conjoint tendon was visualized.  Soft tissue was split adjacent to the conjoint tendon and self-retaining retractor was placed underneath the deltoid and underneath the conjoint tendon.  Biceps tendon was identified and subscap was divided leaving the cuff laterally at the lesser tuberosity for later repair.  Joint was opened.  Version was checked carefully and cut with 20 degrees retroversion.  The patient was 80 years old who had longstanding shoulder osteoarthritis, had been on multiple anti-inflammatories, had multiple cortisone injections, and despite her advanced age, still wanted shoulder arthroplasty.  She had total knee arthroplasty done a few years ago and remains active in the community, walking in health exercise programs, does volunteer work,  shops, Social research officer, government.  She is Hydrographic surveyor and stated that her shoulder bothers at night, pain with activities, wakes her up at night and prevents her from doing many things that she enjoys.  There were inferior spurs were carefully cleaned with a Beyer rongeur. Some posterior spurs removed.  The patient had significant 1.5-cm medial osteophytes.  Once these were resected, glenoid was exposed.  Small narrow __________ were used for exposure on the glenoid, labrum was so degenerative, needed minimal resection, 40-mm component was inserted, central hole drilled, central hole reamed, and then with the guide, the additional 3 holes were reamed.  They were all checked with the narrow suction tip, all were in the bone.  Trial fit well, 40 mm which was the smallest glenoid size, DePuy was mixed with cement and __________ returned, and then, glenoid was inserted and impacted into place, pressurized and held securely.  Cement was hardened 15 minutes.  On the humeral side, broaching was performed, the metaphyseal bone was soft and there was satisfactory cortical bone but not enough cancellous bone in the greater tuberosity region to hold rotation.  At 80 years old with lack of rotation control proximally, some more cement was then mixed, applied primarily to the proximal portion, just placed on the stem and around the __________ holes for normal rotator cuff repair or tuberosity repair and then this was impacted into place  down, flushed with the calcar.  This allowed filling the void with bone cement.  All excess cement was removed.  Final spot fluoroscopic pictures were taken. Compared with the trials with satisfactory position.  The patient tolerated the procedure well and was transferred to the recovery room. Copious irrigation, repair of the subscap that had been divided with #2 FiberWire, total 4 separate sutures and 2-0 Vicryl in subcutaneous tissue, skin staple closure.  The patient tolerated  the procedure well. Instrument count and needle count was correct.     Heidi Moore, [name]     MCY/MEDQ  D:  09/11/2013  T:  09/12/2013  Job:  [id number]

## 2013-09-12 NOTE — Progress Notes (Signed)
Occupational Therapy Note  Continued with education re; precautions, positioning and bed mobility . Overall, requires min A.  Will practice BADLs in am.   09/12/13 1700  OT Visit Information  Last OT Received On 09/12/13  Assistance Needed +1  History of Present Illness This 80 y.o. female admitted 09/11/13 for Rt. TSA due to OA  OT Time Calculation  OT Start Time 1533  OT Stop Time 1550  OT Time Calculation (min) 17 min  Precautions  Precautions Shoulder  Type of Shoulder Precautions OT for ADL and sling education.  Message left for MD and PA to confirm no ROM required as there are no activity orders for Rt. UE  Shoulder Interventions Shoulder sling/immobilizer;At all times  Precaution Booklet Issued Yes (comment)  Precaution Comments shoulder handout provided  Required Braces or Orthoses Sling  Cognition  Arousal/Alertness Awake/alert  Behavior During Therapy [facility] for tasks assessed/performed  Overall Cognitive Status Within Functional Limits for tasks assessed  ADL  Overall ADL's  Needs assistance/impaired  Toilet Transfer Minimal assistance;Ambulation  Toileting- Clothing Manipulation and Hygiene Minimal assistance;Sit to/from stand  General ADL Comments Pt ambulated on unit with min a to increase endurance and safety needed for BADLs.  Reviewed precautions with pt.    Bed Mobility  Overal bed mobility Needs Assistance  Bed Mobility Sit to Supine  Supine to sit Supervision (HOB flat)  General bed mobility comments Instructed pt to place pillow under Rt humerus  Balance  Sitting balance-Leahy Scale Good  Standing balance-Leahy Scale Fair  Restrictions  Weight Bearing Restrictions Yes  RUE Weight Bearing NWB  Transfers  Overall transfer level Needs assistance  Equipment used 1 person hand held assist  Sit to Stand Min assist  Stand pivot transfers Min assist  General transfer comment Pt mildly unsteady  Shoulder Instructions  Correct positioning of sling/immobilizer  Supervision/safety  Proper positioning of operated UE when showering Supervision/safety  Positioning of UE while sleeping Supervision/safety  OT General Charges  $OT Visit 1 Procedure  OT Treatments  $Therapeutic Activity 8-22 mins  [facility], OTR/L [phone]

## 2013-09-12 NOTE — Progress Notes (Signed)
Subjective: 1 Day Post-Op Procedure(s) (LRB): TOTAL SHOULDER ARTHROPLASTY (Right) Patient reports pain as moderate.    Objective: Vital signs in last 24 hours: Temp:  [97.4 F (36.3 C)-98.6 F (37 C)] 98.6 F (37 C) (05/05 0656) Pulse Rate:  [65-85] 85 (05/05 0656) Resp:  [9-22] 18 (05/05 0656) BP: (116-143)/(44-89) 128/78 mmHg (05/05 0656) SpO2:  [97 %-100 %] 98 % (05/05 0656) Weight:  [53.269 kg (117 lb 7 oz)] 53.269 kg (117 lb 7 oz) (05/04 1052)  Intake/Output from previous day: 05/04 0701 - 05/05 0700 In: 2390 [P.O.:360; I.V.:2030] Out: 2425 [Urine:2325; Blood:100] Intake/Output this shift:    No results found for this basename: HGB,  in the last 72 hours No results found for this basename: WBC, RBC, HCT, PLT,  in the last 72 hours No results found for this basename: NA, K, CL, CO2, BUN, CREATININE, GLUCOSE, CALCIUM,  in the last 72 hours No results found for this basename: LABPT, INR,  in the last 72 hours  Neurologically intact block has worn off. . Dressing dry.   Assessment/Plan: 1 Day Post-Op Procedure(s) (LRB): TOTAL SHOULDER ARTHROPLASTY (Right) Up with therapy ambulation.   Once pain controlled will be discharged. Likely Wednesday.   Heidi Moore 09/12/2013, 7:40 AM

## 2013-09-12 NOTE — Progress Notes (Signed)
Utilization review completed.  

## 2013-09-12 NOTE — Evaluation (Signed)
Occupational Therapy Evaluation Patient Details Name: Heidi Moore MRN: [id number] DOB: 1919-11-05 Today's Date: 09/12/2013    History of Present Illness This 80 y.o. female admitted 09/11/13 for Rt. TSA due to OA   Clinical Impression   Pt admitted with above. She demonstrates the below listed deficits and will benefit from continued OT to maximize safety and independence with BADLs.  Pt requires min A for BADLs.  Family is supportive and will provide assist as needed at home.  Will continue to follow.      Follow Up Recommendations  No OT follow up;Supervision/Assistance - 24 hour    Equipment Recommendations  None recommended by OT    Recommendations for Other Services       Precautions / Restrictions Precautions Precautions: Shoulder Type of Shoulder Precautions: OT for ADL and sling education.  Message left for MD and PA to confirm no ROM required as there are no activity orders for Rt. UE Shoulder Interventions: Shoulder sling/immobilizer;At all times Precaution Booklet Issued: Yes (comment) Precaution Comments: shoulder handout provided Required Braces or Orthoses: Sling Restrictions Weight Bearing Restrictions: Yes RUE Weight Bearing: Non weight bearing      Mobility Bed Mobility Overal bed mobility: Needs Assistance Bed Mobility: Supine to Sit     Supine to sit: Supervision;HOB elevated        Transfers Overall transfer level: Needs assistance Equipment used: 1 person hand held assist Transfers: Sit to/from [facility] Sit to Stand: Min assist Stand pivot transfers: Min assist       General transfer comment: Pt mildly unsteady    Balance Overall balance assessment: Needs assistance Sitting-balance support: Feet supported Sitting balance-Leahy Scale: Good     Standing balance support: No upper extremity supported Standing balance-Leahy Scale: Fair                              ADL                                                Vision                     Perception     Praxis      Pertinent Vitals/Pain 8/10 Rt shoulder.  Pt premedicated and ice applied.      Hand Dominance Left   Extremity/Trunk Assessment Upper Extremity Assessment Upper Extremity Assessment: RUE deficits/detail RUE Deficits / Details: NT due to precautions RUE: Unable to fully assess due to immobilization   Lower Extremity Assessment Lower Extremity Assessment: Overall WFL for tasks assessed   Cervical / Trunk Assessment Cervical / Trunk Assessment: Kyphotic   Communication Communication Communication: No difficulties   Cognition Arousal/Alertness: Awake/alert Behavior During Therapy: WFL for tasks assessed/performed Overall Cognitive Status: Within Functional Limits for tasks assessed                     General Comments       Exercises Exercises: Shoulder     Shoulder Instructions Shoulder Instructions Donning/doffing shirt without moving shoulder: Minimal assistance Method for sponge bathing under operated UE: Minimal assistance Donning/doffing sling/immobilizer: Minimal assistance Correct positioning of sling/immobilizer: Minimal assistance Sling wearing schedule (on at all times/off for ADL's): Supervision/safety Proper positioning of operated UE when showering: Supervision/safety Positioning of UE while sleeping: Minimal  assistance    Home Living Family/patient expects to be discharged to:: Private residence Living Arrangements: Alone Available Help at Discharge: Available PRN/intermittently;Family Type of Home: House             Bathroom Shower/Tub: Walk-in Psychologist, prison and probation services: Standard     Home Equipment: Shower seat          Prior Functioning/Environment Level of Independence: Independent        Comments: Pt exercised several times a week and is very active    OT Diagnosis: Generalized weakness;Acute pain   OT Problem List:  Decreased strength;Decreased range of motion;Decreased activity tolerance;Impaired balance (sitting and/or standing);Decreased knowledge of use of DME or AE;Decreased knowledge of precautions;Pain;Impaired UE functional use   OT Treatment/Interventions: Self-care/ADL training;Therapeutic exercise;DME and/or AE instruction;Therapeutic activities;Balance training;Patient/family education    OT Goals(Current goals can be found in the care plan section) Acute Rehab OT Goals Patient Stated Goal: To go home OT Goal Formulation: With patient/family Time For Goal Achievement: 09/19/13 Potential to Achieve Goals: Good ADL Goals Pt Will Perform Upper Body Bathing: with supervision;sitting Pt Will Perform Upper Body Dressing: with supervision;sitting Pt Will Transfer to Toilet: with supervision;regular height toilet;ambulating Pt Will Perform Tub/Shower Transfer: Shower transfer;with supervision;ambulating;shower seat Additional ADL Goal #1: Pt and family will be independent with precautions, positioning Rt. UE and sling management  Additional ADL Goal #2: Pt will be modified independent with bed mobility   OT Frequency: Min 2X/week   Barriers to D/C:            Co-evaluation              End of Session Nurse Communication: Mobility status  Activity Tolerance: Patient limited by pain Patient left: in chair;with call bell/phone within reach;with family/visitor present   Time: 6160-7371 OT Time Calculation (min): 45 min Charges:  OT General Charges $OT Visit: 1 Procedure OT Evaluation $Initial OT Evaluation Tier I: 1 Procedure OT Treatments $Self Care/Home Management : 38-52 mins G-Codes:    Christpoher Sievers M Darus Hershman 09/16/2013, 2:18 PM

## 2013-09-13 MED ORDER — METAXALONE 800 MG PO TABS: 800.0000 mg | ORAL_TABLET | Freq: Three times a day (TID) | ORAL | Status: DC | PRN

## 2013-09-13 NOTE — Progress Notes (Signed)
Subjective: 2 Days Post-Op Procedure(s) (LRB): TOTAL SHOULDER ARTHROPLASTY (Right) Patient reports pain as mild.    Objective: Vital signs in last 24 hours: Temp:  [97.8 F (36.6 C)-98.4 F (36.9 C)] 98.1 F (36.7 C) (05/06 0623) Pulse Rate:  [75-91] 91 (05/06 0623) Resp:  [18] 18 (05/06 0623) BP: (118-125)/(52-58) 125/57 mmHg (05/06 0623) SpO2:  [98 %-100 %] 98 % (05/06 0623)  Intake/Output from previous day: 05/05 0701 - 05/06 0700 In: 1080 [P.O.:1080] Out: 450 [Urine:450] Intake/Output this shift:    No results found for this basename: HGB,  in the last 72 hours No results found for this basename: WBC, RBC, HCT, PLT,  in the last 72 hours No results found for this basename: NA, K, CL, CO2, BUN, CREATININE, GLUCOSE, CALCIUM,  in the last 72 hours No results found for this basename: LABPT, INR,  in the last 72 hours  Neurologically intact  Assessment/Plan: 2 Days Post-Op Procedure(s) (LRB): TOTAL SHOULDER ARTHROPLASTY (Right) Plan:  Discharge Home. Office one week. Percocet and skelaxin rx.   Marybelle Killings 09/13/2013, 7:55 AM

## 2013-09-13 NOTE — Discharge Instructions (Signed)
Change dressing as needed. Ice to shoulder as needed for pain and swelling.  Wear sling at all times.

## 2013-09-13 NOTE — Progress Notes (Signed)
Patient discharged to home accompanied by daughter. Discharge instructions and rx given and explained and patient stated understanding. IV was removed and patient left unit in a stable condition via wheelchair with all personal belongings.  

## 2013-09-15 NOTE — Discharge Summary (Signed)
Physician Discharge Summary  Patient ID: Heidi Moore MRN: [id number] DOB/AGE: [birth year] 80 y.o.  Admit date: 09/11/2013 Discharge date: 09/13/2013  Admission Diagnoses:  Osteoarthritis of right shoulder  Discharge Diagnoses:  Principal Problem:   Osteoarthritis of right shoulder   Past Medical History  Diagnosis Date  . Diverticulosis   . Allergic rhinitis   . Osteopenia     Intolerance to Calcium,Actonel,Fosamax,Evista, Forteo: S/P Boniva 2007 to 03/2010  . History of skin cancer     Basal cell, Dr. Lindwood Coke  . Hyperlipidemia     [place] Study LDL goal =<657   . Complication of anesthesia     headaches  . Bursitis     right shoulder  . PONV (postoperative nausea and vomiting)   . Hypotension   . Headache(784.0)     sinus HA  . Arthritis   . Joint pain   . Joint swelling   . Osteoporosis   . Urinary frequency   . History of cystitis   . AC (acromioclavicular) joint bone spurs     on right shoulder   . History of blood transfusion 74yrs ago    no abnormal reaction noted  . Macular degeneration, dry     Surgeries: Procedure(s): TOTAL SHOULDER ARTHROPLASTY on 09/11/2013   Consultants (if any):  NONE  Discharged Condition: Improved  Hospital Course: Heidi Moore is an 80 y.o. female who was admitted 09/11/2013 with a diagnosis of Osteoarthritis of right shoulder and went to the operating room on 09/11/2013 and underwent the above named procedures.    She was given perioperative antibiotics:  Anti-infectives   Start     Dose/Rate Route Frequency Ordered Stop   09/10/13 0755  ceFAZolin (ANCEF) IVPB 2 g/50 mL premix  Status:  Discontinued     2 g 100 mL/hr over 30 Minutes Intravenous On call to O.R. 09/10/13 8469 09/11/13 1814    .  She was given sequential compression devices, early ambulation for DVT prophylaxis.  She benefited maximally from the hospital stay and there were no complications.    Recent vital signs:  Filed Vitals:   09/13/13 0623   BP: 125/57  Pulse: 91  Temp: 98.1 F (36.7 C)  Resp: 18    Recent laboratory studies:  Lab Results  Component Value Date   HGB 14.1 09/05/2013   HGB 9.1* 05/21/2012   HGB 9.3* 05/20/2012   Lab Results  Component Value Date   WBC 6.7 09/05/2013   PLT 173 09/05/2013   Lab Results  Component Value Date   INR 1.05 09/05/2013   Lab Results  Component Value Date   NA 144 09/05/2013   K 4.1 09/05/2013   CL 106 09/05/2013   CO2 25 09/05/2013   BUN 12 09/05/2013   CREATININE 0.85 09/05/2013   GLUCOSE 88 09/05/2013    Discharge Medications:     Medication List         acetaminophen 500 MG tablet  Commonly known as:  TYLENOL  Take 500 mg by mouth every 6 (six) hours as needed. For pain     B-complex with vitamin C tablet  Take 1 tablet by mouth daily.     beta carotene w/minerals tablet  Take 1 tablet by mouth daily.     calcium carbonate 500 MG chewable tablet  Commonly known as:  TUMS - dosed in mg elemental calcium  Chew 1 tablet by mouth daily.     cholecalciferol 1000 UNITS tablet  Commonly known as:  VITAMIN D  Take 1,000 Units by mouth daily.     conjugated estrogens vaginal cream  Commonly known as:  PREMARIN  Place 1 Applicatorful vaginally 2 (two) times a week. Sunday and thursday     estradiol 0.0375 mg/24hr patch  Commonly known as:  CLIMARA - Dosed in mg/24 hr  Place 0.0188-0.0375 mg onto the skin once a week. saturdays     Magnesium 250 MG Tabs  Take 250 mg by mouth at bedtime.     MEDI-PATCH-LIDOCAINE 0.5-0.035-5-20 % Ptch  Generic drug:  Lido-Capsaicin-Men-Methyl Sal  Apply 1 patch topically every other day.     metaxalone 800 MG tablet  Commonly known as:  SKELAXIN  Take 1 tablet (800 mg total) by mouth every 8 (eight) hours as needed for muscle spasms.     metroNIDAZOLE 1 % gel  Commonly known as:  METROGEL  Apply topically daily.     OVER THE COUNTER MEDICATION  Take 1 capsule by mouth 2 (two) times daily. Baxter7 daily.      oxyCODONE-acetaminophen 5-325 MG per tablet  Commonly known as:  ROXICET  Take 1 tablet by mouth every 4 (four) hours as needed.     vitamin C 500 MG tablet  Commonly known as:  ASCORBIC ACID  Take 500 mg by mouth 2 (two) times daily.     vitamin E 400 UNIT capsule  Generic drug:  vitamin E  Take 400 Units by mouth daily.        Diagnostic Studies: Dg Chest 2 View  09/05/2013   CLINICAL DATA:  Preoperative shoulder arthroplasty  EXAM: CHEST  2 VIEW  COMPARISON:  May 09, 2012  FINDINGS: There is underlying emphysematous change. There is no edema or consolidation. The heart size is normal. The pulmonary vascularity reflects underlying emphysema. There is atherosclerotic change in the aorta. There is mild degenerative change in the thoracic spine.  IMPRESSION: Underlying emphysematous change.  No edema or consolidation.   Electronically Signed   By: Lowella Grip [name]   On: 09/05/2013 13:27   Dg Shoulder Right  09/11/2013   CLINICAL DATA:  Imaging during right shoulder arthroplasty  EXAM: DG C-ARM 1-60 MIN; RIGHT SHOULDER - 2+ VIEW  : COMPARISON:  None  FINDINGS: Images submitted for review show a right shoulder arthroplasty that appears well seated and aligned. There is no acute fracture or evidence of an operative complication.  IMPRESSION: Operative imaging during right shoulder arthroplasty. Please refer to the procedure report for a complete description.   Electronically Signed   By: Lajean Manes [name]   On: 09/11/2013 16:51   Dg C-arm 1-60 Min  09/11/2013   CLINICAL DATA:  Imaging during right shoulder arthroplasty  EXAM: DG C-ARM 1-60 MIN; RIGHT SHOULDER - 2+ VIEW  : COMPARISON:  None  FINDINGS: Images submitted for review show a right shoulder arthroplasty that appears well seated and aligned. There is no acute fracture or evidence of an operative complication.  IMPRESSION: Operative imaging during right shoulder arthroplasty. Please refer to the procedure report for a complete  description.   Electronically Signed   By: Lajean Manes [name]   On: 09/11/2013 16:51    Disposition: 01-Home or Self Care      Discharge Orders   Future Appointments Provider Department Dept Phone   01/29/2014 9:30 AM Lucretia Kern, [place] [facility]   Future Orders Complete By Expires   Call MD / Call 911  As directed  Constipation Prevention  As directed    Diet - low sodium heart healthy  As directed    Discharge instructions  As directed    Increase activity slowly as tolerated  As directed    Lifting restrictions  As directed     Change dressing as needed. Ice to shoulder as needed for pain and swelling.  Wear sling at all times.    Follow-up Information   Follow up with Marybelle Killings, MD. Schedule an appointment as soon as possible for a visit in 1 week.   Specialty:  Orthopedic Surgery   Contact information:   [address] [place] [address] [phone]        Signed: [name] 09/15/2013, 4:02 PM

## 2013-10-30 ENCOUNTER — Ambulatory Visit (INDEPENDENT_AMBULATORY_CARE_PROVIDER_SITE_OTHER): Payer: Medicare Other | Admitting: Family Medicine

## 2013-10-30 ENCOUNTER — Encounter: Payer: Self-pay | Admitting: Family Medicine

## 2013-10-30 VITALS — BP 100/62 | HR 70 | Temp 98.4°F | Ht 61.25 in | Wt 120.0 lb

## 2013-10-30 DIAGNOSIS — H6120 Impacted cerumen, unspecified ear: Secondary | ICD-10-CM

## 2013-10-30 DIAGNOSIS — H6122 Impacted cerumen, left ear: Secondary | ICD-10-CM

## 2013-10-30 DIAGNOSIS — L293 Anogenital pruritus, unspecified: Secondary | ICD-10-CM

## 2013-10-30 DIAGNOSIS — Z23 Encounter for immunization: Secondary | ICD-10-CM

## 2013-10-30 DIAGNOSIS — Z0000 Encounter for general adult medical examination without abnormal findings: Secondary | ICD-10-CM

## 2013-10-30 DIAGNOSIS — L292 Pruritus vulvae: Secondary | ICD-10-CM

## 2013-10-30 NOTE — Progress Notes (Signed)
Medicare Annual Preventive Care Visit  (annual wellness exam)  1) Cerumen Impaction: -reports told by audiologist has ear wax -wants me to check this -hearing ok, mild hearing loss evaluated by audiologist, she does not want to get hearing aides at this time  2)Vaginal Atrophy -reports seeing Dr. Gertie Fey for this -burning of skin, no dysuria, vaginal discharge, drainage   Reports doing ok. Recently had knee surgery and then having 1.) Patient-completed health risk assessment  - completed and reviewed, see scanned documentation  2.) Review of Medical History: -PMH, PSH, Family History and current specialty and care providers reviewed and updated and listed below  - see chart and below  3.) Review of functional ability and level of safety:  Any difficulty hearing? NO, uses hearing aide  History of falling?  NO  Any trouble with IADLs - using a phone, using transportation, grocery shopping, preparing meals, doing housework, doing laundry, taking medications and managing money? NO  Advance Directives? YES, daughter Brandt Loosen is HCPOA [phone]  See summary of recommendations in Patient Instructions below.  4.) Physical Exam Filed Vitals:   10/30/13 1121  BP: 100/62  Pulse: 70  Temp: 98.4 F (36.9 C)   Estimated body mass index is 22.48 kg/(m^2) as calculated from the following:   Height as of this encounter: 5' 1.25" (1.556 m).   Weight as of this encounter: 120 lb (54.432 kg).  Visual Acuity grossly intact  HEENT: sm amount of cerumen both ears, not impacted  GU:  Mini Cog: 1. Patient instructed to listen carefully and repeat the following: [place]  2. Clock drawing test was administered: NORMAL     3. Recall of three words: 3/3  Scoring:  Patient Score: NEG   -see scanned mini cog  See patient instructions for recommendations.  4)The following written screening schedule of preventive measures were reviewed with assessment and plan made  per below, orders and patient instructions:      AAA screening -N/A     Alcohol screening - neg     Obesity Screening and counseling - done, not obese     STI screening - done     Tobacco Screening - done       Pneumococcal (PPSV23 -one dose after 64, one before if risk factors), influenza yearly and hepatitis B vaccines (if high risk - end stage renal disease, IV drugs, homosexual men, live in home for mentally retarded, hemophilia receiving factors) ASSESSMENT/PLAN: completed      Screening mammograph (yearly if >40) ASSESSMENT/PLAN:completed - she wants to stop doing those      Screening Pap smear/pelvic exam (q2 years) ASSESSMENT/PLAN: not indicated      Prostate cancer screening ASSESSMENT/PLAN: N/A      Colorectal cancer screening (FOBT yearly or flex sig q4y or colonoscopy q10y or barium enema q4y) ASSESSMENT/PLAN: Not indicated      Diabetes outpatient self-management training services ASSESSMENT/PLAN: N/A      Bone mass measurements(covered q2y if indicated - estrogen def, osteoporosis, hyperparathyroid, vertebral abnormalities, osteoporosis or steroids) ASSESSMENT/PLAN: followed by gyn, treated in the past, does not wish to do this anymore      Screening for glaucoma(q1y if high risk - diabetes, FH, AA and > 50 or hispanic and > 65) ASSESSMENT/PLAN: completed - followed closely by optho, last exam 04/2012      Medical nutritional therapy for individuals with diabetes or renal disease ASSESSMENT/PLAN: N/A      Cardiovascular screening blood tests (lipids q5y)  ASSESSMENT/PLAN: completed      Diabetes screening tests ASSESSMENT/PLAN: completed  TDAP: wants today  Reports had prevnar 13 at [facility]  7.) Summary: -risk factors and conditions per above assessment were discussed and treatment, recommendations and referrals were offered per documentation above and orders and patient instructions. She is a very healthy 80 yo F with a healthy lifestyle. She has aged out  of many screening tests, or prefers to stop these due to her 80 She has her daughter designated as Economist. She is very functional in ADL AIDLs and has no depression or falls. Follow up in 6 months.  Debrox for mild ear wax Topical hydrocortisone for one week for vulvovag pruritis and see dermatologist - number given to call as ongoing for 1 year and has seen gyn and urology and not resolving. Query neurodermatitis.

## 2013-10-30 NOTE — Addendum Note (Signed)
Addended by: Agnes Lawrence on: 10/30/2013 12:02 PM   Modules accepted: Orders

## 2013-10-30 NOTE — Progress Notes (Signed)
Pre visit review using our clinic review tool, if applicable. No additional management support is needed unless otherwise documented below in the visit note. 

## 2014-01-29 ENCOUNTER — Encounter: Payer: Medicare Other | Admitting: Family Medicine

## 2014-02-09 ENCOUNTER — Other Ambulatory Visit: Payer: Self-pay | Admitting: Family Medicine

## 2014-02-12 ENCOUNTER — Ambulatory Visit (INDEPENDENT_AMBULATORY_CARE_PROVIDER_SITE_OTHER): Payer: Medicare Other | Admitting: Family Medicine

## 2014-02-12 ENCOUNTER — Encounter: Payer: Self-pay | Admitting: Family Medicine

## 2014-02-12 VITALS — BP 92/60 | HR 79 | Temp 97.7°F | Ht 61.25 in | Wt 127.0 lb

## 2014-02-12 DIAGNOSIS — R197 Diarrhea, unspecified: Secondary | ICD-10-CM

## 2014-02-12 NOTE — Patient Instructions (Addendum)
-  try align daily for a few weeks  -follow up in 4 weeks if persists

## 2014-02-12 NOTE — Progress Notes (Signed)
Pre visit review using our clinic review tool, if applicable. No additional management support is needed unless otherwise documented below in the visit note. 

## 2014-02-12 NOTE — Progress Notes (Signed)
No chief complaint on file.   HPI:  Acute visit for:  1) Diarrhea: -1 week ago for after eating chicken ranch casserole at [employer] (had 3 episodes loose stools) -occ bloating when eats a lot of fiber -but otherwise normal bowels -Denies: blood in the stools, weight loss, mucus in stools, abd pain, nausea vomiting  ROS: See pertinent positives and negatives per HPI.   Past Medical History  Diagnosis Date  . Diverticulosis   . Allergic rhinitis   . Osteopenia     Intolerance to Calcium,Actonel,Fosamax,Evista, Forteo: S/P Boniva 2007 to 03/2010  . History of skin cancer     Basal cell, Dr. Lindwood Coke  . Hyperlipidemia     [place] Study LDL goal =<654   . Complication of anesthesia     headaches  . Bursitis     right shoulder  . PONV (postoperative nausea and vomiting)   . Hypotension   . Headache(784.0)     sinus HA  . Arthritis   . Joint pain   . Joint swelling   . Osteoporosis   . Urinary frequency   . History of cystitis   . AC (acromioclavicular) joint bone spurs     on right shoulder   . History of blood transfusion 1yrs ago    no abnormal reaction noted  . Macular degeneration, dry   . DIVERTICULOSIS, COLON 10/15/2006    Qualifier: Diagnosis of  By: Linna Darner MD, [place]    . OSTEOPENIA 03/12/2008    Qualifier: Diagnosis of  By: Velora Heckler    . Rosacea 04/22/2009    Qualifier: Diagnosis of  By: Linna Darner MD, [place], HX OF 03/12/2008    Qualifier: Diagnosis of  By: Velora Heckler      Past Surgical History  Procedure Laterality Date  . Total abdominal hysterectomy w/ bilateral salpingoophorectomy      for Endometriosis   . Colonoscopy      with Tics (no further f/u as per GI due to age)   . Cataract extraction, bilateral    . Nose surgery      due to trama at age 16   . Mohs surgery      right nostril for basel cell cancer   . Lt knee       meniscus tear  . Knee arthroplasty  05/18/2012    Procedure: COMPUTER ASSISTED TOTAL KNEE  ARTHROPLASTY;  Surgeon: Marybelle Killings, MD;  Location: [facility];  Service: Orthopedics;  Laterality: Left;  Left Total Knee Arthroplasty, Cemented, Computer Assisted  . Bunionectomy Bilateral   . Appendectomy    . Total shoulder arthroplasty Right 09/11/2013    Procedure: TOTAL SHOULDER ARTHROPLASTY;  Surgeon: Marybelle Killings, MD;  Location: [facility];  Service: Orthopedics;  Laterality: Right;  Right Total Shoulder Arthroplasty    Family History  Problem Relation Age of Onset  . Colon cancer Other     1st Cousin   . Stroke Mother 52  . Diabetes Brother   . Atrial fibrillation Brother     coumadin  . Lung cancer Sister     History   Social History  . Marital Status: Widowed    Spouse Name: N/A    Number of Children: N/A  . Years of Education: N/A   Social History Main Topics  . Smoking status: Never Smoker   . Smokeless tobacco: Never Used  . Alcohol Use: No  . Drug Use: No  . Sexual Activity:  None   Other Topics Concern  . 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, church work, sings in choir, campaign work      Home Situation: lives alone, unassisted - mows her own lawn, manages her own finances, feeds and baths herself, drives - her opthalmologist is ok with her vision for driving per her report      Spiritual Beliefs: Christian      Lifestyle: she gets regular exercise, tries to eat a healthy diet             Current outpatient prescriptions:B Complex-C (B-COMPLEX WITH VITAMIN C) tablet, Take 1 tablet by mouth daily., Disp: , Rfl: ;  beta carotene w/minerals (OCUVITE) tablet, Take 1 tablet by mouth daily., Disp: , Rfl: ;  calcium carbonate (TUMS - DOSED IN MG ELEMENTAL CALCIUM) 500 MG chewable tablet, Chew 1 tablet by mouth daily., Disp: , Rfl: ;  cholecalciferol (VITAMIN D) 1000 UNITS tablet, Take 1,000 Units by mouth daily. , Disp: , Rfl:  conjugated estrogens (PREMARIN) vaginal cream, Place 1 Applicatorful  vaginally 2 (two) times a week. Sunday and thursday, Disp: , Rfl: ;  estradiol (CLIMARA - DOSED IN MG/24 HR) 0.0375 mg/24hr patch, Place 0.0188-0.0375 mg onto the skin once a week. saturdays, Disp: , Rfl: ;  Magnesium 250 MG TABS, Take 250 mg by mouth at bedtime. , Disp: , Rfl: ;  metroNIDAZOLE (METROGEL) 1 % gel, Apply topically daily., Disp: 60 g, Rfl: 11 OVER THE COUNTER MEDICATION, Take 1 capsule by mouth 2 (two) times daily. [place]7 daily., Disp: , Rfl: ;  vitamin C (ASCORBIC ACID) 500 MG tablet, Take 500 mg by mouth 2 (two) times daily., Disp: , Rfl: ;  vitamin E (VITAMIN E) 400 UNIT capsule, Take 400 Units by mouth daily., Disp: , Rfl:   EXAM:  Filed Vitals:   02/12/14 1525  BP: 92/60  Pulse: 79  Temp: 97.7 F (36.5 C)    Body mass index is 23.79 kg/(m^2).  GENERAL: vitals reviewed and listed above, alert, oriented, appears well hydrated and in no acute distress  HEENT: atraumatic, conjunttiva clear, no obvious abnormalities on inspection of external nose and ears  NECK: no obvious masses on inspection  LUNGS: clear to auscultation bilaterally, no wheezes, rales or rhonchi, good air movement  CV: HRRR, no peripheral edema  ABD: BS+, soft, NTTP  MS: moves all extremities without noticeable abnormality  PSYCH: pleasant and cooperative, no obvious depression or anxiety  ASSESSMENT AND PLAN:  Discussed the following assessment and plan:  Diarrhea  -she eats a lot of fiber and this may be contributing -trial of align and normal diet -Patient advised to return or notify a doctor immediately if symptoms worsen or persist or new concerns arise.  Patient Instructions  -try align daily for a few weeks  -follow up in 4 weeks if persists       Heidi Moore R.

## 2014-02-16 ENCOUNTER — Telehealth: Payer: Self-pay | Admitting: Family Medicine

## 2014-02-16 DIAGNOSIS — L719 Rosacea, unspecified: Secondary | ICD-10-CM

## 2014-02-16 NOTE — Telephone Encounter (Signed)
Pt request refill of the following: metroNIDAZOLE (METROGEL) 1 % gel  Pt called to say that Dr Gaetano Net does not rx her the above med it was Dr Linna Darner. The last time she got it rx it was by Dr Asencion Noble: [facility]

## 2014-02-16 NOTE — Telephone Encounter (Signed)
I gave her this once for bacterial vaginitis, but that was over 1 year ago. I believe she told me she she has been seeing Dr. Gertie Fey for her vaginal issues which I believe she said he felt was atrophy? Metronidazole would not help with this. I advise she see her gynecologist if having new or recurrent symptoms - or we could see her here.

## 2014-02-19 DIAGNOSIS — L719 Rosacea, unspecified: Secondary | ICD-10-CM | POA: Insufficient documentation

## 2014-02-19 MED ORDER — METRONIDAZOLE 1 % EX GEL: Freq: Every day | CUTANEOUS | Status: DC

## 2014-02-19 NOTE — Telephone Encounter (Signed)
Patient states she is using this as needed for rosacea on her face. Message forwarded to Dr Maudie Mercury.

## 2014-02-19 NOTE — Telephone Encounter (Signed)
Patient informed. 

## 2014-02-19 NOTE — Telephone Encounter (Signed)
Sent to pharmacy 

## 2014-03-12 ENCOUNTER — Encounter: Payer: Self-pay | Admitting: Family Medicine

## 2014-05-08 ENCOUNTER — Encounter: Payer: Self-pay | Admitting: Family Medicine

## 2014-05-08 ENCOUNTER — Ambulatory Visit (INDEPENDENT_AMBULATORY_CARE_PROVIDER_SITE_OTHER): Payer: Medicare Other | Admitting: Family Medicine

## 2014-05-08 VITALS — BP 122/62 | HR 73 | Temp 97.3°F | Ht 61.25 in | Wt 126.4 lb

## 2014-05-08 DIAGNOSIS — I872 Venous insufficiency (chronic) (peripheral): Secondary | ICD-10-CM

## 2014-05-08 NOTE — Patient Instructions (Signed)
-  wear compression sock daily, remove at night  -low sodium diet  -elevate legs above waist for 20-30 minutes when possible  -follow up in 2-3 weeks

## 2014-05-08 NOTE — Progress Notes (Signed)
Pre visit review using our clinic review tool, if applicable. No additional management support is needed unless otherwise documented below in the visit note. 

## 2014-05-08 NOTE — Progress Notes (Signed)
HPI:  LE edema: -chronic issues with venous insufficiency -mild bilat ankle swelling intermittently -mild pain in R ankle last week, now resolved -denies:recent travel, recent surgery, CP, SOB, cough  ROS: See pertinent positives and negatives per HPI.  Past Medical History  Diagnosis Date  . Diverticulosis   . Allergic rhinitis   . Osteopenia     Intolerance to Calcium,Actonel,Fosamax,Evista, Forteo: S/P Boniva 2007 to 03/2010  . History of skin cancer     Basal cell, Dr. Lindwood Coke  . Hyperlipidemia     [place] Study LDL goal =<921   . Complication of anesthesia     headaches  . Bursitis     right shoulder  . PONV (postoperative nausea and vomiting)   . Hypotension   . Headache(784.0)     sinus HA  . Arthritis   . Joint pain   . Joint swelling   . Osteoporosis   . Urinary frequency   . History of cystitis   . AC (acromioclavicular) joint bone spurs     on right shoulder   . History of blood transfusion 25yrs ago    no abnormal reaction noted  . Macular degeneration, dry   . DIVERTICULOSIS, COLON 10/15/2006    Qualifier: Diagnosis of  By: Linna Darner MD, [place]    . OSTEOPENIA 03/12/2008    Qualifier: Diagnosis of  By: Velora Heckler    . Rosacea 04/22/2009    Qualifier: Diagnosis of  By: Linna Darner MD, [place], HX OF 03/12/2008    Qualifier: Diagnosis of  By: Velora Heckler      Past Surgical History  Procedure Laterality Date  . Total abdominal hysterectomy w/ bilateral salpingoophorectomy      for Endometriosis   . Colonoscopy      with Tics (no further f/u as per GI due to age)   . Cataract extraction, bilateral    . Nose surgery      due to trama at age 74   . Mohs surgery      right nostril for basel cell cancer   . Lt knee       meniscus tear  . Knee arthroplasty  05/18/2012    Procedure: COMPUTER ASSISTED TOTAL KNEE ARTHROPLASTY;  Surgeon: Marybelle Killings, MD;  Location: [facility] [facility];  Service: Orthopedics;  Laterality: Left;  Left Total Knee  Arthroplasty, Cemented, Computer Assisted  . Bunionectomy Bilateral   . Appendectomy    . Total shoulder arthroplasty Right 09/11/2013    Procedure: TOTAL SHOULDER ARTHROPLASTY;  Surgeon: Marybelle Killings, MD;  Location: [facility];  Service: Orthopedics;  Laterality: Right;  Right Total Shoulder Arthroplasty    Family History  Problem Relation Age of Onset  . Colon cancer Other     1st Cousin   . Stroke Mother 63  . Diabetes Brother   . Atrial fibrillation Brother     coumadin  . Lung cancer Sister     History   Social History  . Marital Status: Widowed    Spouse Name: N/A    Number of Children: N/A  . Years of Education: N/A   Social History Main Topics  . Smoking status: Never Smoker   . Smokeless tobacco: Never Used  . Alcohol Use: No  . Drug Use: No  . Sexual Activity: None   Other Topics Concern  . 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, church work, sings in choir, campaign work      Home Situation: lives alone, unassisted - mows her own lawn, manages her own finances, feeds and baths herself, drives - her opthalmologist is ok with her vision for driving per her report      Spiritual Beliefs: Christian      Lifestyle: she gets regular exercise, tries to eat a healthy diet             Current outpatient prescriptions: B Complex-C (B-COMPLEX WITH VITAMIN C) tablet, Take 1 tablet by mouth daily., Disp: , Rfl: ;  beta carotene w/minerals (OCUVITE) tablet, Take 1 tablet by mouth daily., Disp: , Rfl: ;  calcium carbonate (TUMS - DOSED IN MG ELEMENTAL CALCIUM) 500 MG chewable tablet, Chew 1 tablet by mouth daily., Disp: , Rfl: ;  cholecalciferol (VITAMIN D) 1000 UNITS tablet, Take 1,000 Units by mouth daily. , Disp: , Rfl:  conjugated estrogens (PREMARIN) vaginal cream, Place 1 Applicatorful vaginally 2 (two) times a week. Sunday and thursday, Disp: , Rfl: ;  estradiol (CLIMARA - DOSED IN MG/24 HR) 0.0375  mg/24hr patch, Place 0.0188-0.0375 mg onto the skin once a week. saturdays, Disp: , Rfl: ;  Magnesium 250 MG TABS, Take 250 mg by mouth at bedtime. , Disp: , Rfl: ;  metroNIDAZOLE (METROGEL) 1 % gel, Apply topically daily., Disp: 45 g, Rfl: 3 OVER THE COUNTER MEDICATION, Take 1 capsule by mouth 2 (two) times daily. [place]7 daily., Disp: , Rfl: ;  vitamin C (ASCORBIC ACID) 500 MG tablet, Take 500 mg by mouth 2 (two) times daily., Disp: , Rfl: ;  vitamin E (VITAMIN E) 400 UNIT capsule, Take 400 Units by mouth daily., Disp: , Rfl:   EXAM:  Filed Vitals:   05/08/14 0917  BP: 122/62  Pulse: 73  Temp: 97.3 F (36.3 C)    Body mass index is 23.68 kg/(m^2).  GENERAL: vitals reviewed and listed above, alert, oriented, appears well hydrated and in no acute distress  HEENT: atraumatic, conjunttiva clear, no obvious abnormalities on inspection of external nose and ears  NECK: no obvious masses on inspection  LUNGS: clear to auscultation bilaterally, no wheezes, rales or rhonchi, good air movement  CV: HRRR, mild bilat LE ankle peripheral edema, varicose veins and spider veins bilat  MS: moves all extremities without noticeable abnormality  PSYCH: pleasant and cooperative, no obvious depression or anxiety  ASSESSMENT AND PLAN:  Discussed the following assessment and plan:  Chronic venous insufficiency  -we discussed possible serious and likely etiologies, workup and treatment, treatment risks and return precautions -after this discussion, Heidi Moore opted for compression, elevation -follow up advised2-3 weeks as anxious pt and as needed -of course, we advised Heidi Moore  to return or notify a doctor immediately if symptoms worsen or persist or new concerns arise.  -Patient advised to return or notify a doctor immediately if symptoms worsen or persist or new concerns arise.  Patient Instructions  -wear compression sock daily, remove at night  -low sodium diet  -elevate legs above  waist for 20-30 minutes when possible  -follow up in 2-3 weeks     Heidi Moore R.

## 2014-05-24 ENCOUNTER — Encounter: Payer: Self-pay | Admitting: Family Medicine

## 2014-05-24 ENCOUNTER — Ambulatory Visit (INDEPENDENT_AMBULATORY_CARE_PROVIDER_SITE_OTHER): Payer: Medicare Other | Admitting: Family Medicine

## 2014-05-24 VITALS — BP 110/60 | HR 80 | Temp 97.5°F | Ht 61.25 in | Wt 126.4 lb

## 2014-05-24 DIAGNOSIS — I872 Venous insufficiency (chronic) (peripheral): Secondary | ICD-10-CM

## 2014-05-24 DIAGNOSIS — R600 Localized edema: Secondary | ICD-10-CM

## 2014-05-24 DIAGNOSIS — N760 Acute vaginitis: Secondary | ICD-10-CM

## 2014-05-24 DIAGNOSIS — L853 Xerosis cutis: Secondary | ICD-10-CM

## 2014-05-24 NOTE — Patient Instructions (Signed)
Wear the compression stockings and elevate legs daily  Use cerave or aquaphor for dry skin  Try hydrocortisone and yeast cream for 1 week twice daily and let your gynecologist know if your genital rash persists

## 2014-05-24 NOTE — Progress Notes (Signed)
Pre visit review using our clinic review tool, if applicable. No additional management support is needed unless otherwise documented below in the visit note. 

## 2014-05-24 NOTE — Progress Notes (Signed)
HPI:  Heidi Moore is a sweet yet very anxious 81 yo female here for follow up of her LE edema.  LE edema: -chronic and mild -advised compression and elevation - resolved with this and then she stopped this and swelling recurred some R>L -denies: DOE, chest pain, palpitations, SOB, orthopnea, pain  Vulvovaginitis: -sees gyn for this -reports had some recurrence the last 2 weeks and called her gynecologist -reports told to use OTC HC cream and almost resolved but concerned still mild symptoms of erythema and mild pruritis -denies: discharge, internal symptoms  Itchy skin on abd: -for 1 week -worried is reaction to top sports cream she is using on shoulder -no pain, no rash  ROS: See pertinent positives and negatives per HPI.  Past Medical History  Diagnosis Date  . Diverticulosis   . Allergic rhinitis   . Osteopenia     Intolerance to Calcium,Actonel,Fosamax,Evista, Forteo: S/P Boniva 2007 to 03/2010  . History of skin cancer     Basal cell, Dr. Lindwood Coke  . Hyperlipidemia     [place] Study LDL goal =<462   . Complication of anesthesia     headaches  . Bursitis     right shoulder  . PONV (postoperative nausea and vomiting)   . Hypotension   . Headache(784.0)     sinus HA  . Arthritis   . Joint pain   . Joint swelling   . Osteoporosis   . Urinary frequency   . History of cystitis   . AC (acromioclavicular) joint bone spurs     on right shoulder   . History of blood transfusion 82yrs ago    no abnormal reaction noted  . Macular degeneration, dry   . DIVERTICULOSIS, COLON 10/15/2006    Qualifier: Diagnosis of  By: Linna Darner MD, [place]    . OSTEOPENIA 03/12/2008    Qualifier: Diagnosis of  By: Velora Heckler    . Rosacea 04/22/2009    Qualifier: Diagnosis of  By: Linna Darner MD, [place], HX OF 03/12/2008    Qualifier: Diagnosis of  By: Velora Heckler      Past Surgical History  Procedure Laterality Date  . Total abdominal hysterectomy w/  bilateral salpingoophorectomy      for Endometriosis   . Colonoscopy      with Tics (no further f/u as per GI due to age)   . Cataract extraction, bilateral    . Nose surgery      due to trama at age 3   . Mohs surgery      right nostril for basel cell cancer   . Lt knee       meniscus tear  . Knee arthroplasty  05/18/2012    Procedure: COMPUTER ASSISTED TOTAL KNEE ARTHROPLASTY;  Surgeon: Marybelle Killings, MD;  Location: [facility];  Service: Orthopedics;  Laterality: Left;  Left Total Knee Arthroplasty, Cemented, Computer Assisted  . Bunionectomy Bilateral   . Appendectomy    . Total shoulder arthroplasty Right 09/11/2013    Procedure: TOTAL SHOULDER ARTHROPLASTY;  Surgeon: Marybelle Killings, MD;  Location: [facility];  Service: Orthopedics;  Laterality: Right;  Right Total Shoulder Arthroplasty    Family History  Problem Relation Age of Onset  . Colon cancer Other     1st Cousin   . Stroke Mother 60  . Diabetes Brother   . Atrial fibrillation Brother     coumadin  . Lung cancer Sister     History  Social History  . Marital Status: Widowed    Spouse Name: N/A    Number of Children: N/A  . Years of Education: N/A   Social History Main Topics  . Smoking status: Never Smoker   . Smokeless tobacco: Never Used  . Alcohol Use: No  . Drug Use: No  . Sexual Activity: None   Other Topics Concern  . 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  B Complex-C (B-COMPLEX WITH VITAMIN C) tablet, Take 1 tablet by mouth daily., Disp: , Rfl:   .  beta carotene w/minerals (OCUVITE) tablet, Take 1 tablet by mouth daily., Disp: , Rfl:  .  calcium carbonate (TUMS - DOSED IN MG ELEMENTAL CALCIUM) 500 MG chewable tablet, Chew 1 tablet by mouth daily., Disp: , Rfl:  .  cholecalciferol (VITAMIN D) 1000 UNITS tablet, Take 1,000 Units by mouth daily. , Disp: , Rfl:  .  conjugated estrogens (PREMARIN) vaginal cream, Place 1 Applicatorful vaginally 2 (two) times a week. Sunday and thursday, Disp: , Rfl:  .  estradiol (CLIMARA - DOSED IN MG/24 HR) 0.0375 mg/24hr patch, Place 0.0188-0.0375 mg onto the skin once a week. saturdays, Disp: , Rfl:  .  Magnesium 250 MG TABS, Take 250 mg by mouth at bedtime. , Disp: , Rfl:  .  metroNIDAZOLE (METROGEL) 1 % gel, Apply topically daily., Disp: 45 g, Rfl: 3 .  OVER THE COUNTER MEDICATION, Take 1 capsule by mouth 2 (two) times daily. [place]7 daily., Disp: , Rfl:  .  vitamin C (ASCORBIC ACID) 500 MG tablet, Take 500 mg by mouth 2 (two) times daily., Disp: , Rfl:  .  vitamin E (VITAMIN E) 400 UNIT capsule, Take 400 Units by mouth daily., Disp: , Rfl:   EXAM:  Filed Vitals:   05/24/14 1435  BP: 110/60  Pulse: 80  Temp: 97.5 F (36.4 C)    Body mass index is 23.68 kg/(m^2).  GENERAL: vitals reviewed and listed above, alert, oriented, appears well hydrated and in no acute distress  HEENT: atraumatic, conjunttiva clear, no obvious abnormalities on inspection of external nose and ears  NECK: no obvious masses on inspection  LUNGS: clear to auscultation bilaterally, no wheezes, rales or rhonchi, good air movement  CV: HRRR, tr peripheral edema R mildly > L in ankles only  SKIN: dry skin  MS: moves all extremities without noticeable abnormality  PSYCH: pleasant and cooperative, no obvious depression or anxiety  ASSESSMENT AND PLAN:  Discussed the following assessment and plan:  Bilateral edema of lower extremity Chronic venous insufficiency -resolved with compression and  elevation -offered LE [place] given mildly asymmetric swelling and anxious pt, she declined -cont compression and elevation, return precautions  Vaginitis and vulvovaginitis -advised HC with topical yeat cream 1-2 times dialy for 1 week and follow up with her gynecologist if persists  Xerosis of skin -advised cerave or aquafor  -Patient  advised to return or notify a doctor immediately if symptoms worsen or persist or new concerns arise.  Patient Instructions  Wear the compression stockings and elevate legs daily  Use cerave or aquaphor for dry skin  Try hydrocortisone and yeast cream for 1 week twice daily and let your gynecologist know if your genital rash persists     Josyah Achor R.

## 2014-05-29 ENCOUNTER — Ambulatory Visit: Payer: Medicare Other | Admitting: Family Medicine

## 2014-09-13 ENCOUNTER — Other Ambulatory Visit (HOSPITAL_COMMUNITY): Payer: Self-pay | Admitting: Obstetrics and Gynecology

## 2014-10-16 ENCOUNTER — Ambulatory Visit (INDEPENDENT_AMBULATORY_CARE_PROVIDER_SITE_OTHER): Payer: Medicare Other | Admitting: Family Medicine

## 2014-10-16 ENCOUNTER — Encounter: Payer: Self-pay | Admitting: Family Medicine

## 2014-10-16 VITALS — BP 130/74 | HR 79 | Temp 97.6°F | Ht 61.25 in | Wt 129.7 lb

## 2014-10-16 DIAGNOSIS — R600 Localized edema: Secondary | ICD-10-CM | POA: Diagnosis not present

## 2014-10-16 DIAGNOSIS — H9202 Otalgia, left ear: Secondary | ICD-10-CM

## 2014-10-16 DIAGNOSIS — J309 Allergic rhinitis, unspecified: Secondary | ICD-10-CM

## 2014-10-16 MED ORDER — FLUTICASONE PROPIONATE 50 MCG/ACT NA SUSP
1.0000 | Freq: Every day | NASAL | Status: DC
Start: 2014-10-16 — End: 2015-12-09

## 2014-10-16 NOTE — Progress Notes (Signed)
Pre visit review using our clinic review tool, if applicable. No additional management support is needed unless otherwise documented below in the visit note. 

## 2014-10-16 NOTE — Patient Instructions (Addendum)
Elevation of legs for 30 minutes twice daily and compression socks for the legs  flonase 1 spray each nostril daily for 1 month along with the over the counter allergy medication you are taking  Debrox over the counter ear cleaning kit - 1-2 drops daily for one week  Follow up in 3 months or sooner if needed

## 2014-10-16 NOTE — Progress Notes (Signed)
HPI:  Heidi Moore is a pleasant 81 yo F with several acute issues today:  Allergies: -started: chronic -symptoms:nasal congestion, PND, ear pain and pressure -denies:fever, SOB, NVD, tooth pain -has tried: over the counter zyrtec - which helps a little, take inconsistently  L ear pain: -ear wax issues -denies issues with hearing related to this, is itchy  LE edema: Chronic Intermittent swelling in bilat ankles She wonders what to do about it Denies SOB, orthopnea, CP, DOE   ROS: See pertinent positives and negatives per HPI.  Past Medical History  Diagnosis Date  . Diverticulosis   . Allergic rhinitis   . Osteopenia     Intolerance to Calcium,Actonel,Fosamax,Evista, Forteo: S/P Boniva 2007 to 03/2010  . History of skin cancer     Basal cell, Dr. Lindwood Coke  . Hyperlipidemia     [place] Study LDL goal =<563   . Complication of anesthesia     headaches  . Bursitis     right shoulder  . PONV (postoperative nausea and vomiting)   . Hypotension   . Headache(784.0)     sinus HA  . Arthritis   . Joint pain   . Joint swelling   . Osteoporosis   . Urinary frequency   . History of cystitis   . AC (acromioclavicular) joint bone spurs     on right shoulder   . History of blood transfusion 27yr ago    no abnormal reaction noted  . Macular degeneration, dry   . DIVERTICULOSIS, COLON 10/15/2006    Qualifier: Diagnosis of  By: HLinna DarnerMD, William    . OSTEOPENIA 03/12/2008    Qualifier: Diagnosis of  By: HVelora Heckler   . Rosacea 04/22/2009    Qualifier: Diagnosis of  By: HLinna DarnerMD, [place] HX OF 03/12/2008    Qualifier: Diagnosis of  By: HVelora Heckler     Past Surgical History  Procedure Laterality Date  . Total abdominal hysterectomy w/ bilateral salpingoophorectomy      for Endometriosis   . Colonoscopy      with Tics (no further f/u as per GI due to age)   . Cataract extraction, bilateral    . Nose surgery      due to trama at  age 81  . Mohs surgery      right nostril for basel cell cancer   . Lt knee       meniscus tear  . Knee arthroplasty  05/18/2012    Procedure: COMPUTER ASSISTED TOTAL KNEE ARTHROPLASTY;  Surgeon: MMarybelle Killings MD;  [place] [facility]  Service: Orthopedics;  Laterality: Left;  Left Total Knee Arthroplasty, Cemented, Computer Assisted  . Bunionectomy Bilateral   . Appendectomy    . Total shoulder arthroplasty Right 09/11/2013    Procedure: TOTAL SHOULDER ARTHROPLASTY;  Surgeon: MMarybelle Killings MD;  [place] [facility]  Service: Orthopedics;  Laterality: Right;  Right Total Shoulder Arthroplasty    Family History  Problem Relation Age of Onset  . Colon cancer Other     1st Cousin   . Stroke Mother 832 . Diabetes Brother   . Atrial fibrillation Brother     coumadin  . Lung cancer Sister     History   Social History  . Marital Status: Widowed    Spouse Name: N/A  . Number of Children: N/A  . Years of Education: N/A   Social History Main Topics  . Smoking status:  Never Smoker   . Smokeless tobacco: Never Used  . Alcohol Use: No  . Drug Use: No  . Sexual Activity: Not on file   Other Topics Concern  . 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  B Complex-C (B-COMPLEX WITH VITAMIN C) tablet, Take 1 tablet by mouth daily., Disp: , Rfl:  .  beta carotene w/minerals (OCUVITE) tablet, Take 1 tablet by mouth daily., Disp: , Rfl:  .  calcium carbonate (TUMS - DOSED IN MG ELEMENTAL CALCIUM) 500 MG chewable tablet, Chew 1 tablet by  mouth daily., Disp: , Rfl:  .  cholecalciferol (VITAMIN D) 1000 UNITS tablet, Take 1,000 Units by mouth daily. , Disp: , Rfl:  .  conjugated estrogens (PREMARIN) vaginal cream, Place 1 Applicatorful vaginally 2 (two) times a week. Sunday and thursday, Disp: , Rfl:  .  estradiol (CLIMARA - DOSED IN MG/24 HR) 0.0375 mg/24hr patch, Place 0.0188-0.0375 mg onto the skin once a week. saturdays, Disp: , Rfl:  .  Magnesium 250 MG TABS, Take 250 mg by mouth at bedtime. , Disp: , Rfl:  .  metroNIDAZOLE (METROGEL) 1 % gel, Apply topically daily., Disp: 45 g, Rfl: 3 .  nystatin-triamcinolone (MYCOLOG II) cream, Apply 1 application topically as needed., Disp: , Rfl:  .  OVER THE COUNTER MEDICATION, Take 1 capsule by mouth 2 (two) times daily. [place]7 daily., Disp: , Rfl:  .  vitamin C (ASCORBIC ACID) 500 MG tablet, Take 500 mg by mouth 2 (two) times daily., Disp: , Rfl:  .  vitamin E (VITAMIN E) 400 UNIT capsule, Take 400 Units by mouth daily., Disp: , Rfl:  .  fluticasone (FLONASE) 50 MCG/ACT nasal spray, Place 1 spray into both nostrils daily., Disp: 16 g, Rfl: 6  EXAM:  Filed Vitals:   10/16/14 1615  BP: 130/74  Pulse:   Temp:     Body mass index is 24.3 kg/(m^2).  GENERAL: vitals reviewed and listed above, alert, oriented, appears well hydrated and in no acute distress  HEENT: atraumatic, conjunttiva clear, no obvious abnormalities on inspection of external nose and ears, normal appearance of ear canals and TMs some wax in L ear canal, L TM scarring, no mastoid TTP, clear nasal congestion, mild post oropharyngeal erythema with PND, no tonsillar edema or exudate, no sinus TTP  NECK: no obvious masses on inspection  LUNGS: clear to auscultation bilaterally, no wheezes, rales or rhonchi, good air movement  CV: HRRR, tr bilat ankle peripheral edema, spider veins, non-thrombosed varicose veins minimal  MS: moves all extremities without noticeable abnormality  PSYCH: pleasant and  cooperative, no obvious depression or anxiety  ASSESSMENT AND PLAN:  Discussed the following assessment and plan:  Allergic rhinitis, unspecified allergic rhinitis type - Plan: fluticasone (FLONASE) 50 MCG/ACT nasal spray -take antihistamine daily and flonase 1 spray daily during allergy seasons, RTD as needed  Left ear pain -likely  related to her allergies and some ETD -minimal wax, removed with soft currette - tolerated wekk, minimal stuck on wax remained  Bilateral edema of lower extremity -compression and elevation advised along with healthy diet and regular exercise  -of course, we advised to return or notify a doctor immediately if symptoms worsen or persist or new concerns arise.    Patient Instructions  Elevation of legs for 30 minutes twice daily and compression socks for the legs  flonase 1 spray each nostril daily for 1 month along with the over the counter allergy medication you are taking  Debrox over the counter ear cleaning kit - 1-2 drops daily for one week  Follow up in 3 months or sooner if needed     KIM, HANNAH R.

## 2014-11-06 ENCOUNTER — Encounter: Payer: Self-pay | Admitting: Family Medicine

## 2014-11-06 ENCOUNTER — Ambulatory Visit (INDEPENDENT_AMBULATORY_CARE_PROVIDER_SITE_OTHER): Payer: Medicare Other | Admitting: Family Medicine

## 2014-11-06 VITALS — BP 116/72 | HR 75 | Temp 97.7°F | Ht 61.25 in | Wt 127.2 lb

## 2014-11-06 DIAGNOSIS — Z0000 Encounter for general adult medical examination without abnormal findings: Secondary | ICD-10-CM

## 2014-11-06 DIAGNOSIS — R5382 Chronic fatigue, unspecified: Secondary | ICD-10-CM

## 2014-11-06 DIAGNOSIS — E785 Hyperlipidemia, unspecified: Secondary | ICD-10-CM

## 2014-11-06 LAB — CBC
HCT: 44.4 % (ref 36.0–46.0)
Hemoglobin: 14.6 g/dL (ref 12.0–15.0)
MCHC: 32.9 g/dL (ref 30.0–36.0)
MCV: 91.4 fl (ref 78.0–100.0)
Platelets: 192 10*3/uL (ref 150.0–400.0)
RBC: 4.86 Mil/uL (ref 3.87–5.11)
RDW: 14.1 % (ref 11.5–15.5)
WBC: 7.1 10*3/uL (ref 4.0–10.5)

## 2014-11-06 LAB — BASIC METABOLIC PANEL
BUN: 14 mg/dL (ref 6–23)
CHLORIDE: 107 meq/L (ref 96–112)
CO2: 30 meq/L (ref 19–32)
Calcium: 9.9 mg/dL (ref 8.4–10.5)
Creatinine, Ser: 0.96 mg/dL (ref 0.40–1.20)
GFR: 57.71 mL/min — ABNORMAL LOW (ref >60.00)
GLUCOSE: 88 mg/dL (ref 70–99)
POTASSIUM: 4.1 meq/L (ref 3.5–5.1)
SODIUM: 143 meq/L (ref 135–145)

## 2014-11-06 LAB — LIPID PANEL
CHOLESTEROL: 170 mg/dL (ref 0–200)
HDL: 63.4 mg/dL (ref >39.00)
LDL Cholesterol: 95 mg/dL (ref 0–99)
NONHDL: 106.6
Total CHOL/HDL Ratio: 3
Triglycerides: 57 mg/dL (ref 0.0–149.0)
VLDL: 11.4 mg/dL (ref 0.0–40.0)

## 2014-11-06 NOTE — Progress Notes (Signed)
Medicare Annual Preventive Care Visit  (initial annual wellness or annual wellness exam)  Concerns and follow up today: none. She works out 3 days per week at [employer] and eats well.  ROS: negative for report of fevers, unintentional weight loss, vision changes, vision loss, hearing loss or change, chest pain, sob, hemoptysis, melena, hematochezia, hematuria, genital discharge or lesions, falls, bleeding or bruising, loc, thoughts of suicide or self harm, memory loss. Does have some fatigue at times and wants to check basic labs.  1.) Patient-completed health risk assessment  - completed and reviewed, see scanned documentation  2.) Review of Medical History: -PMH, PSH, Family History and current specialty and care providers reviewed and updated and listed below  - see scanned in document in chart and below  Past Medical History  Diagnosis Date  . Diverticulosis   . Allergic rhinitis   . Osteopenia     Intolerance to Calcium,Actonel,Fosamax,Evista, Forteo: S/P Boniva 2007 to 03/2010  . History of skin cancer     Basal cell, Dr. Lindwood Coke  . Hyperlipidemia     [place] Study LDL goal =<836   . Complication of anesthesia     headaches  . Bursitis     right shoulder  . PONV (postoperative nausea and vomiting)   . Hypotension   . Headache(784.0)     sinus HA  . Arthritis   . Joint pain   . Joint swelling   . Osteoporosis   . Urinary frequency   . History of cystitis   . AC (acromioclavicular) joint bone spurs     on right shoulder   . History of blood transfusion 61yrs ago    no abnormal reaction noted  . Macular degeneration, dry   . DIVERTICULOSIS, COLON 10/15/2006    Qualifier: Diagnosis of  By: Linna Darner MD, William    . OSTEOPENIA 03/12/2008    Qualifier: Diagnosis of  By: Velora Heckler    . Rosacea 04/22/2009    Qualifier: Diagnosis of  By: Linna Darner MD, [place], HX OF 03/12/2008    Qualifier: Diagnosis of  By: Velora Heckler    . Vaginal  atrophy     sees Dr. Gertie Fey    Past Surgical History  Procedure Laterality Date  . Total abdominal hysterectomy w/ bilateral salpingoophorectomy      for Endometriosis   . Colonoscopy      with Tics (no further f/u as per GI due to age)   . Cataract extraction, bilateral    . Nose surgery      due to trama at age 48   . Mohs surgery      right nostril for basel cell cancer   . Lt knee       meniscus tear  . Knee arthroplasty  05/18/2012    Procedure: COMPUTER ASSISTED TOTAL KNEE ARTHROPLASTY;  Surgeon: Marybelle Killings, MD;  Location: [place];  Service: Orthopedics;  Laterality: Left;  Left Total Knee Arthroplasty, Cemented, Computer Assisted  . Bunionectomy Bilateral   . Appendectomy    . Total shoulder arthroplasty Right 09/11/2013    Procedure: TOTAL SHOULDER ARTHROPLASTY;  Surgeon: Marybelle Killings, MD;  Location: [place];  Service: Orthopedics;  Laterality: Right;  Right Total Shoulder Arthroplasty    History   Social History  . Marital Status: Widowed    Spouse Name: N/A  . Number of Children: N/A  . Years of Education: N/A   Occupational History  . Not on  file.   Social History Main Topics  . Smoking status: Never Smoker   . Smokeless tobacco: Never Used  . Alcohol Use: No  . Drug Use: No  . Sexual Activity: Not on file   Other Topics Concern  . 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 NO  History of falling? NO  Any  trouble with IADLs - using a phone, using transportation, grocery shopping, preparing meals, doing housework, doing laundry, taking medications and managing money? NO  Advance Directives? YES  See summary of recommendations in Patient Instructions below.  4.) Physical Exam Filed Vitals:   11/06/14 0958  BP: 116/72  Pulse: 75  Temp: 97.7 F (36.5 C)   Estimated body mass index is 23.83 kg/(m^2) as calculated from the following:   Height as of this encounter: 5' 1.25" (1.556 m).   Weight as of this encounter: 127 lb 3.2 oz (57.698 kg).  EKG (optional): deferred  General: alert, appear well hydrated and in no acute distress  HEENT: visual acuity grossly intact  CV: HRRR  Lungs: CTA bilaterally  Psych: pleasant and cooperative, no obvious depression or anxiety  Cognitive function grossly intact  See patient instructions for recommendations.  Education and counseling regarding the above review of health provided with a plan for the following: -see scanned patient completed form for further details -fall prevention strategies discussed  -healthy lifestyle discussed -importance and resources for completing advanced directives discussed -see patient instructions below for any other recommendations provided  4)The following written screening schedule of preventive measures were reviewed with assessment and plan made per below, orders and patient instructions:           Alcohol screening: done     Obesity Screening and counseling: done     STI screening (Hep C if born 1943-65): declined     Tobacco Screening: done       Pneumococcal (PPSV23 -one dose after 64, one before if risk factors), influenza yearly and hepatitis B vaccines (if high risk - end stage renal disease, IV drugs, homosexual men, live in home for mentally retarded, hemophilia receiving factors) ASSESSMENT/PLAN: reports she had this done      Screening mammograph (yearly if >40) ASSESSMENT/PLAN: declined  due to age      Screening Pap smear/pelvic exam (q2 years) ASSESSMENT/PLAN: n/a      Colorectal cancer screening (FOBT yearly or flex sig q4y or colonoscopy q10y or barium enema q4y) ASSESSMENT/PLAN: declined      Diabetes outpatient self-management training services ASSESSMENT/PLAN:  n/a      Bone mass measurements(covered q2y if indicated - estrogen def, osteoporosis, hyperparathyroid, vertebral abnormalities, osteoporosis or steroids) ASSESSMENT/PLAN: n/a      Screening for glaucoma(q1y if high risk - diabetes, FH, AA and > 50 or hispanic and > 65) ASSESSMENT/PLAN:  sees her eye doctor yearly      Medical nutritional therapy for individuals with diabetes or renal disease ASSESSMENT/PLAN: n/a      Cardiovascular screening blood tests (lipids q5y) ASSESSMENT/PLAN: FASTING, doing today      Diabetes screening tests ASSESSMENT/PLAN: doing today   7.) Summary: -risk factors and conditions per above assessment were discussed and treatment, recommendations and referrals were offered per documentation above and orders and patient instructions.  Medicare annual wellness visit, subsequent - Plan: Lipid Panel  Hyperlipemia - Plan: Lipid Panel  Chronic fatigue - Plan: CBC (no diff), Basic metabolic panel  Patient Instructions  BEFORE YOU LEAVE: -labs  -We have ordered labs or studies at this visit. It can take up to 1-2 weeks for results and processing. We will contact you with instructions IF your results are abnormal. Normal results will be released to your [facility]. If you have not heard from [place] or can not find your results in [facility] in 2 weeks please contact our office.  We recommend the following healthy lifestyle measures: - eat a healthy diet consisting of lots of vegetables, fruits, beans, nuts, seeds, healthy meats such as white chicken and fish and whole grains.  - avoid fried foods, fast food, processed foods, sodas, red meet and other fattening foods.  - get a least 150  minutes of aerobic exercise per week.

## 2014-11-06 NOTE — Progress Notes (Signed)
Pre visit review using our clinic review tool, if applicable. No additional management support is needed unless otherwise documented below in the visit note. 

## 2014-11-06 NOTE — Patient Instructions (Signed)
BEFORE YOU LEAVE: -labs  -We have ordered labs or studies at this visit. It can take up to 1-2 weeks for results and processing. We will contact you with instructions IF your results are abnormal. Normal results will be released to your [facility]. If you have not heard from [place] or can not find your results in [facility] in 2 weeks please contact our office.  We recommend the following healthy lifestyle measures: - eat a healthy diet consisting of lots of vegetables, fruits, beans, nuts, seeds, healthy meats such as white chicken and fish and whole grains.  - avoid fried foods, fast food, processed foods, sodas, red meet and other fattening foods.  - get a least 150 minutes of aerobic exercise per week.

## 2015-07-01 ENCOUNTER — Ambulatory Visit (INDEPENDENT_AMBULATORY_CARE_PROVIDER_SITE_OTHER): Payer: Medicare Other | Admitting: Family Medicine

## 2015-07-01 ENCOUNTER — Encounter: Payer: Self-pay | Admitting: Family Medicine

## 2015-07-01 VITALS — BP 98/50 | HR 77 | Temp 97.5°F | Ht 61.25 in | Wt 129.7 lb

## 2015-07-01 DIAGNOSIS — H6981 Other specified disorders of Eustachian tube, right ear: Secondary | ICD-10-CM | POA: Diagnosis not present

## 2015-07-01 DIAGNOSIS — J309 Allergic rhinitis, unspecified: Secondary | ICD-10-CM | POA: Diagnosis not present

## 2015-07-01 NOTE — Progress Notes (Signed)
Pre visit review using our clinic review tool, if applicable. No additional management support is needed unless otherwise documented below in the visit note. 

## 2015-07-01 NOTE — Progress Notes (Signed)
HPI:  Heidi Moore is hear for an acute visit for R ear issues. She reports she has had some allergy issues for the last few weeks with nasal congestion, postnasal drip and her right ear feels filled up. She is concerned she has wax in this ear as has had this in the past and saw an ear nose and throat doctor. She occasionally feels like her balance is slightly off. Denies nausea, vomiting, falls, syncope, chest pain, palpitations, shortness of breath, fevers or headache.   ROS: See pertinent positives and negatives per HPI.  Past Medical History  Diagnosis Date  . Diverticulosis   . Allergic rhinitis   . Osteopenia     Intolerance to Calcium,Actonel,Fosamax,Evista, Forteo: S/P Boniva 2007 to 03/2010  . History of skin cancer     Basal cell, Dr. Lindwood Coke  . Hyperlipidemia     [place] Study LDL goal [id number]   . Complication of anesthesia     headaches  . Bursitis     right shoulder  . PONV (postoperative nausea and vomiting)   . Hypotension   . Headache(784.0)     sinus HA  . Arthritis   . Joint pain   . Joint swelling   . Osteoporosis   . Urinary frequency   . History of cystitis   . AC (acromioclavicular) joint bone spurs     on right shoulder   . History of blood transfusion 86yrs ago    no abnormal reaction noted  . Macular degeneration, dry   . DIVERTICULOSIS, COLON 10/15/2006    Qualifier: Diagnosis of  By: Linna Darner MD, [place]    . OSTEOPENIA 03/12/2008    Qualifier: Diagnosis of  By: Velora Heckler    . Rosacea 04/22/2009    Qualifier: Diagnosis of  By: Linna Darner MD, [place], HX OF 03/12/2008    Qualifier: Diagnosis of  By: Velora Heckler    . Vaginal atrophy     sees Dr. Gertie Fey    Past Surgical History  Procedure Laterality Date  . Total abdominal hysterectomy w/ bilateral salpingoophorectomy      for Endometriosis   . Colonoscopy      with Tics (no further f/u as per GI due to age)   . Cataract extraction, bilateral    . Nose surgery       due to trama at age 3   . Mohs surgery      right nostril for basel cell cancer   . Lt knee       meniscus tear  . Knee arthroplasty  05/18/2012    Procedure: COMPUTER ASSISTED TOTAL KNEE ARTHROPLASTY;  Surgeon: Marybelle Killings, MD;  Location: [place];  Service: Orthopedics;  Laterality: Left;  Left Total Knee Arthroplasty, Cemented, Computer Assisted  . Bunionectomy Bilateral   . Appendectomy    . Total shoulder arthroplasty Right 09/11/2013    Procedure: TOTAL SHOULDER ARTHROPLASTY;  Surgeon: Marybelle Killings, MD;  Location: [place];  Service: Orthopedics;  Laterality: Right;  Right Total Shoulder Arthroplasty    Family History  Problem Relation Age of Onset  . Colon cancer Other     1st Cousin   . Stroke Mother 43  . Diabetes Brother   . Atrial fibrillation Brother     coumadin  . Lung cancer Sister     Social History   Social History  . Marital Status: Widowed    Spouse Name: N/A  . Number of Children: N/A  .  Years of Education: N/A   Social History Main Topics  . Smoking status: Never Smoker   . Smokeless tobacco: Never Used  . Alcohol Use: No  . Drug Use: No  . Sexual Activity: Not Asked   Other Topics Concern  . 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  B Complex-C (B-COMPLEX WITH VITAMIN C) tablet, Take 1 tablet by mouth daily., Disp: , Rfl:  .  beta carotene w/minerals (OCUVITE) tablet, Take 1 tablet by mouth daily., Disp: , Rfl:  .  calcium carbonate (TUMS - DOSED IN MG ELEMENTAL CALCIUM) 500 MG chewable  tablet, Chew 1 tablet by mouth daily., Disp: , Rfl:  .  cholecalciferol (VITAMIN D) 1000 UNITS tablet, Take 1,000 Units by mouth daily. , Disp: , Rfl:  .  fluticasone (FLONASE) 50 MCG/ACT nasal spray, Place 1 spray into both nostrils daily., Disp: 16 g, Rfl: 6 .  Magnesium 250 MG TABS, Take 250 mg by mouth at bedtime. , Disp: , Rfl:  .  nystatin-triamcinolone (MYCOLOG II) cream, Apply 1 application topically as needed., Disp: , Rfl:  .  OVER THE COUNTER MEDICATION, Take 1 capsule by mouth 2 (two) times daily. [place]7 daily., Disp: , Rfl:  .  vitamin C (ASCORBIC ACID) 500 MG tablet, Take 500 mg by mouth 2 (two) times daily., Disp: , Rfl:  .  vitamin E (VITAMIN E) 400 UNIT capsule, Take 400 Units by mouth daily., Disp: , Rfl:   EXAM:  Filed Vitals:   07/01/15 1433  BP: 98/50  Pulse: 77  Temp: 97.5 F (36.4 C)    Body mass index is 24.3 kg/(m^2).  GENERAL: vitals reviewed and listed above, alert, oriented, appears well hydrated and in no acute distress  HEENT: atraumatic, conjunttiva clear, no obvious abnormalities on inspection of external nose and ears,normal appearance of ear canals and TMs except for clear effusion R, clear nasal congestion, mild post oropharyngeal erythema with PND, no tonsillar edema or exudate, no sinus TTP  NECK: no obvious masses on inspection, no bruit  LUNGS: clear to auscultation bilaterally, no wheezes, rales or rhonchi, good air movement  CV: HRRR, no peripheral edema  MS: moves all extremities without noticeable abnormality  PSYCH/NEURO: pleasant and cooperative, no obvious depression or anxiety, gait is normal, CN II-XII grossly intact, finger to nose normal, speech and thought processing grossly intact, dix hallpike neg  ASSESSMENT AND PLAN:  Discussed the following assessment and plan:  Allergic rhinitis, unspecified allergic rhinitis type  Eustachian tube dysfunction, right  -we discussed possible serious and likely  etiologies, workup and treatment, treatment risks and return precautions -after this discussion, [place] opted for trial flonase, INS -of course, we advised [place]  to return or notify a doctor immediately if symptoms worsen or persist or new concerns arise.  -Patient advised to return or notify a doctor immediately if symptoms worsen or persist or new  concerns arise.  Patient Instructions  Afrin nasal spray twice daily for 3 days then STOP  Flonase sprays each nostril for 3 weeks  One half tablet of Allegra once daily  Follow-up with me or ear ear nose and throat doctor if you have persistent symptoms     Muskan Bolla R.

## 2015-07-01 NOTE — Patient Instructions (Signed)
Afrin nasal spray twice daily for 3 days then STOP  Flonase sprays each nostril for 3 weeks  One half tablet of Allegra once daily  Follow-up with me or ear ear nose and throat doctor if you have persistent symptoms

## 2015-07-31 ENCOUNTER — Encounter: Payer: Self-pay | Admitting: Adult Health

## 2015-07-31 ENCOUNTER — Ambulatory Visit (INDEPENDENT_AMBULATORY_CARE_PROVIDER_SITE_OTHER): Payer: Medicare Other | Admitting: Adult Health

## 2015-07-31 VITALS — BP 130/60 | Temp 97.9°F | Wt 127.0 lb

## 2015-07-31 DIAGNOSIS — R42 Dizziness and giddiness: Secondary | ICD-10-CM | POA: Diagnosis not present

## 2015-07-31 MED ORDER — MECLIZINE HCL 12.5 MG PO TABS: 12.5000 mg | ORAL_TABLET | Freq: Three times a day (TID) | ORAL | Status: DC | PRN

## 2015-07-31 NOTE — Progress Notes (Signed)
   Subjective:    Patient ID: Heidi Moore, female    DOB: 1920-05-05, 82 y.o.   MRN: [id number]  HPI  82 year old female who presents to the office today after seeing  ENT and being diagnosed with a sinus infection, she was prescribed Cipro but has not started it due to the side effect profile.  Her only complaint is that of dizziness. She denies fevers, chills, sinus pain and pressure,cough, or rhinorrhea.   She has felt dizzy for 3-4 days. Is intermittent and denies any increased dizziness with certain movements.     Review of Systems  Constitutional: Negative.   HENT: Negative.   Respiratory: Negative.   Cardiovascular: Negative.   Neurological: Positive for dizziness.  All other systems reviewed and are negative.      Objective:   Physical Exam  Constitutional: She is oriented to person, place, and time. She appears well-developed and well-nourished. No distress.  HENT:  Head: Normocephalic and atraumatic.  Right Ear: Hearing, tympanic membrane, external ear and ear canal normal.  Left Ear: Hearing, external ear and ear canal normal.  Nose: Nose normal.  Mouth/Throat: Oropharynx is clear and moist. No oropharyngeal exudate.  Trace fluid behind left TM. Does not appear infected  Eyes: Conjunctivae and EOM are normal. Pupils are equal, round, and reactive to light. Right eye exhibits no discharge. Left eye exhibits no discharge. No scleral icterus.  Cardiovascular: Normal rate, regular rhythm, normal heart sounds and intact distal pulses.  Exam reveals no gallop and no friction rub.   No murmur heard. Pulmonary/Chest: Effort normal and breath sounds normal. No respiratory distress. She has no wheezes. She has no rales. She exhibits no tenderness.  Musculoskeletal: Normal range of motion. She exhibits no edema or tenderness.  Lymphadenopathy:    She has no cervical adenopathy.  Neurological: She is alert and oriented to person, place, and time.  Skin: Skin is warm and  dry. No rash noted. She is not diaphoretic. No erythema. No pallor.  Psychiatric: She has a normal mood and affect. Her behavior is normal. Judgment and thought content normal.  Vitals reviewed.     Assessment & Plan:  1. Dizziness - I did not see any signs of sinus infection nor ear infection. Dizizness may be from fluid behind TM or possibly vertigo? - meclizine (ANTIVERT) 12.5 MG tablet; Take 1 tablet (12.5 mg total) by mouth 3 (three) times daily as needed for dizziness.  Dispense: 30 tablet; Refill: 0.Advised to take one half tab at first [facility] medication and no mor ethan two days of afrin - D/c Cipro - Follow up as needed

## 2015-07-31 NOTE — Patient Instructions (Addendum)
It was great meeting you today!  I did not see any signs of infection. You do have fluid behind the ear which is probably what is causing your dizziness.   Make sure you are staying well hydrated.   I have sent in a prescription for Meclinze, take one half a tablet to see how you react to it.   Continue  With Flonase and Allegra  You can use Afrin for 2-3 days.   Follow up with Dr. Maudie Mercury as needed

## 2015-08-07 ENCOUNTER — Encounter: Payer: Self-pay | Admitting: Family Medicine

## 2015-08-07 ENCOUNTER — Ambulatory Visit (INDEPENDENT_AMBULATORY_CARE_PROVIDER_SITE_OTHER): Payer: Medicare Other | Admitting: Family Medicine

## 2015-08-07 VITALS — BP 120/60 | HR 90 | Temp 98.3°F | Wt 128.3 lb

## 2015-08-07 DIAGNOSIS — J329 Chronic sinusitis, unspecified: Secondary | ICD-10-CM | POA: Diagnosis not present

## 2015-08-07 DIAGNOSIS — R42 Dizziness and giddiness: Secondary | ICD-10-CM

## 2015-08-07 MED ORDER — DOXYCYCLINE HYCLATE 100 MG PO CAPS: 100.0000 mg | ORAL_CAPSULE | Freq: Two times a day (BID) | ORAL | Status: DC

## 2015-08-07 NOTE — Patient Instructions (Signed)
BEFORE YOU LEAVE: -Follow up in 1-2 weeks.  Take the doxycycline as prescribed.  Continue your allergy nasal spray  Can use the meclizine as needed.  Seek care immediately if worsening

## 2015-08-07 NOTE — Progress Notes (Signed)
HPI:  Heidi Moore is a very pleasant 82 yo here for an acute visit for sinus congestion and dizziness. She has longstanding intermittent allergic rhinitis, ETD and hx sinusitis. She sees an ENT doctor for this. She has worsening symptoms and some dizziness and saw her ENT whom advised she had sinusitis and Rxd cipro due to her lengthy list of abx allergies. She did not take the cipro due to concerns after reading side effects. Her ENT advised given her allergies to come here. She saw Tommi Rumps last week whom did not think she had sinusitis and gave her meclinzine, flonase and allegra along with short course of Afrin for an ear effusion. She reports she is doing some what better. Still has nasal congestion, PND, R maxillary facial pain and occ dizziness with certain movements accompanied by mild nausea. She reports she does NOT have an allergy to most of the abx - simple has a sensitive stomach. She wonders if she could try a different abx. She reports she only had mild upset stomach with doxy but no other reaction. Denies: HA, fevers, vomiting, falls, weakness, numbness, vision changes, SOB, cp, palpitations.  ROS: See pertinent positives and negatives per HPI.  Past Medical History  Diagnosis Date  . Diverticulosis   . Allergic rhinitis   . Osteopenia     Intolerance to Calcium,Actonel,Fosamax,Evista, Forteo: S/P Boniva 2007 to 03/2010  . History of skin cancer     Basal cell, Dr. Lindwood Coke  . Hyperlipidemia     [place] Study LDL goal [id number]   . Complication of anesthesia     headaches  . Bursitis     right shoulder  . PONV (postoperative nausea and vomiting)   . Hypotension   . Headache(784.0)     sinus HA  . Arthritis   . Joint pain   . Joint swelling   . Osteoporosis   . Urinary frequency   . History of cystitis   . AC (acromioclavicular) joint bone spurs     on right shoulder   . History of blood transfusion 49yrs ago    no abnormal reaction noted  . Macular  degeneration, dry   . DIVERTICULOSIS, COLON 10/15/2006    Qualifier: Diagnosis of  By: Linna Darner MD, William    . OSTEOPENIA 03/12/2008    Qualifier: Diagnosis of  By: Velora Heckler    . Rosacea 04/22/2009    Qualifier: Diagnosis of  By: Linna Darner MD, [place], HX OF 03/12/2008    Qualifier: Diagnosis of  By: Velora Heckler    . Vaginal atrophy     sees Dr. Gertie Fey    Past Surgical History  Procedure Laterality Date  . Total abdominal hysterectomy w/ bilateral salpingoophorectomy      for Endometriosis   . Colonoscopy      with Tics (no further f/u as per GI due to age)   . Cataract extraction, bilateral    . Nose surgery      due to trama at age 4   . Mohs surgery      right nostril for basel cell cancer   . Lt knee       meniscus tear  . Knee arthroplasty  05/18/2012    Procedure: COMPUTER ASSISTED TOTAL KNEE ARTHROPLASTY;  Surgeon: Marybelle Killings, MD;  Location: [place];  Service: Orthopedics;  Laterality: Left;  Left Total Knee Arthroplasty, Cemented, Computer Assisted  . Bunionectomy Bilateral   . Appendectomy    .  Total shoulder arthroplasty Right 09/11/2013    Procedure: TOTAL SHOULDER ARTHROPLASTY;  Surgeon: Marybelle Killings, MD;  Location: [place];  Service: Orthopedics;  Laterality: Right;  Right Total Shoulder Arthroplasty    Family History  Problem Relation Age of Onset  . Colon cancer Other     1st Cousin   . Stroke Mother 60  . Diabetes Brother   . Atrial fibrillation Brother     coumadin  . Lung cancer Sister     Social History   Social History  . Marital Status: Widowed    Spouse Name: N/A  . Number of Children: N/A  . Years of Education: N/A   Social History Main Topics  . Smoking status: Never Smoker   . Smokeless tobacco: Never Used  . Alcohol Use: No  . Drug Use: No  . Sexual Activity: Not Asked   Other Topics Concern  . 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  B Complex-C (B-COMPLEX WITH VITAMIN C) tablet, Take 1 tablet by mouth daily., Disp: , Rfl:  .  beta carotene w/minerals (OCUVITE) tablet, Take 1 tablet by mouth daily., Disp: , Rfl:  .  calcium carbonate (TUMS - DOSED IN MG ELEMENTAL CALCIUM) 500 MG chewable tablet, Chew 1 tablet by mouth daily., Disp: , Rfl:  .  cholecalciferol (VITAMIN D) 1000 UNITS tablet, Take 1,000 Units by mouth daily. , Disp: , Rfl:  .  doxycycline (VIBRAMYCIN) 100 MG capsule, Take 1 capsule (100 mg total) by mouth 2 (two) times daily., Disp: 20 capsule, Rfl: 0 .  fluticasone (FLONASE) 50 MCG/ACT nasal spray, Place 1 spray into both nostrils daily., Disp: 16 g, Rfl: 6 .  Magnesium 250 MG TABS, Take 250 mg by mouth at bedtime. , Disp: , Rfl:  .  meclizine (ANTIVERT) 12.5 MG tablet, Take 1 tablet (12.5 mg total) by mouth 3 (three) times daily as needed for dizziness., Disp: 30 tablet, Rfl: 0 .  nystatin-triamcinolone (MYCOLOG II) cream, Apply 1 application topically as needed., Disp: , Rfl:  .  OVER THE COUNTER MEDICATION, Take 1 capsule by mouth 2 (two) times daily. [place]7 daily., Disp: , Rfl:  .  vitamin C (ASCORBIC ACID) 500 MG tablet, Take 500 mg by mouth 2 (two) times daily., Disp: , Rfl:  .  vitamin E (VITAMIN E) 400 UNIT capsule, Take 400 Units by mouth daily., Disp: , Rfl:   EXAM:  Filed Vitals:   08/07/15 1152  BP: 120/60  Pulse: 90  Temp: 98.3 F (36.8 C)    Body mass index is 24.04 kg/(m^2).  GENERAL: vitals reviewed and listed above, alert, oriented, appears well hydrated and in no acute  distress  HEENT: atraumatic, conjunttiva clear, visual acuity grossly intact, PERRLA, no obvious abnormalities on inspection of external nose and ears, normal appearance of ear canals and TMs except for clear effusion R, white nasal congestion,  mild post oropharyngeal erythema with PND, no tonsillar edema or exudate, R max sinus TTP  NECK: no obvious masses on inspection  LUNGS: clear to auscultation bilaterally, no wheezes, rales or rhonchi, good air movement  CV: HRRR, no peripheral edema  MS: moves all extremities without noticeable abnormality  PSYCH/NERUO: pleasant and cooperative, chronically very talkative, no obvious depression, CN II-XII grossly intact, finger to nose normal, gait normal, speech and thought processing grossly intact, dix hallpike with mild symptoms to R  ASSESSMENT AND PLAN:  Discussed the following assessment and plan:  Rhinosinusitis  Vertigo  -we discussed possible serious and likely etiologies, workup and treatment, treatment risks and return precautions. Given duration of symptoms, dx by ENT, thick congestion, maxillary sinus pain a sinus infection is possible. Discussed tx options and despite listed allergies she wishes to take doxy as is adamant she does not have allergy. Also will have her continue allergy regimen. Meclizine prn. If persistent vertigo will need vestibular rehab/neuroimaging - discussed - she wishes to hold off on this currently and treat sinus issues which I agree are most likely cause of her symptoms. Follow up with me or ENT in 1-2 weeks advised. -of course, we advised Nailea  to return or notify a doctor immediately if symptoms worsen or persist or new concerns arise.    Patient Instructions  BEFORE YOU LEAVE: -Follow up in 1-2 weeks.  Take the doxycycline as prescribed.  Continue your allergy nasal spray  Can use the meclizine as needed.  Seek care immediately if worsening       KIM, HANNAH R.

## 2015-11-07 ENCOUNTER — Ambulatory Visit (INDEPENDENT_AMBULATORY_CARE_PROVIDER_SITE_OTHER): Payer: Medicare Other | Admitting: Family Medicine

## 2015-11-07 ENCOUNTER — Encounter: Payer: Self-pay | Admitting: Family Medicine

## 2015-11-07 VITALS — BP 120/60 | HR 75 | Temp 97.4°F | Ht 61.0 in | Wt 125.4 lb

## 2015-11-07 DIAGNOSIS — R300 Dysuria: Secondary | ICD-10-CM | POA: Diagnosis not present

## 2015-11-07 DIAGNOSIS — R350 Frequency of micturition: Secondary | ICD-10-CM

## 2015-11-07 DIAGNOSIS — R21 Rash and other nonspecific skin eruption: Secondary | ICD-10-CM

## 2015-11-07 DIAGNOSIS — E2839 Other primary ovarian failure: Secondary | ICD-10-CM

## 2015-11-07 LAB — POCT URINALYSIS DIPSTICK
Bilirubin, UA: NEGATIVE
Blood, UA: NEGATIVE
GLUCOSE UA: NEGATIVE
KETONES UA: NEGATIVE
Leukocytes, UA: NEGATIVE
Nitrite, UA: NEGATIVE
Protein, UA: NEGATIVE
SPEC GRAV UA: 1.01
Urobilinogen, UA: 0.2
pH, UA: 5

## 2015-11-07 LAB — URINALYSIS, MICROSCOPIC ONLY: RBC / HPF: NONE SEEN (ref 0–?)

## 2015-11-07 NOTE — Patient Instructions (Addendum)
BEFORE YOU LEAVE: -urine dip, micro and culture -follow up: Please help her to schedule her Medicare visit in one month. Okay to work in if needed.   Please stop the Mycolog cream for at least 1 month.  Continue your Premarin cream. Only use as instructed by her gynecologist.  We did urine studies today and we'll contact you about the results if they are abnormal.  Please use clotrimazole cream (available over-the-counter) 1-2 times daily for 2 weeks.  Please do not use any soaps or lotions or other products other than those listed above on the skin in this area. Only wash with plain water.

## 2015-11-07 NOTE — Progress Notes (Signed)
Pre visit review using our clinic review tool, if applicable. No additional management support is needed unless otherwise documented below in the visit note. 

## 2015-11-07 NOTE — Progress Notes (Signed)
HPI:  She was scheduled for a Medicare wellness visit today, however she wanted to discuss vaginal and urinary issues instead. She reports a long history of vulvovaginal irritation starting in 2014 after knee surgery. She reports intermittent issues with burning sensation in the vulvovaginal area. She reports occasional urinary frequency and urgency. She has seen her gynecologist a number of times and a urologist several times for this. She was told that she has vaginal atrophy and is on twice weekly Premarin cream prescribed by her gynecologist. She also has been given by her gynecologist a nystatin/triamcinolone cream that she has been using about 3 times a day for a year. She reports recently she was treated with several courses of antibiotics for a UTI. Despite that, today she still has some urinary frequency, urinary urgency and burning sensation in the vulvovaginal area. Denies fevers, flank pain, vomiting, vaginal discharge, pelvic pain or gross hematuria.  ROS: See pertinent positives and negatives per HPI.  Past Medical History  Diagnosis Date  . Diverticulosis   . Allergic rhinitis   . Osteopenia     Intolerance to Calcium,Actonel,Fosamax,Evista, Forteo: S/P Boniva 2007 to 03/2010  . History of skin cancer     Basal cell, Dr. Lindwood Coke  . Hyperlipidemia     [place] Study LDL goal [id number]   . Complication of anesthesia     headaches  . Bursitis     right shoulder  . PONV (postoperative nausea and vomiting)   . Hypotension   . Headache(784.0)     sinus HA  . Arthritis   . Joint pain   . Joint swelling   . Osteoporosis   . Urinary frequency   . History of cystitis   . AC (acromioclavicular) joint bone spurs     on right shoulder   . History of blood transfusion 49yrs ago    no abnormal reaction noted  . Macular degeneration, dry   . DIVERTICULOSIS, COLON 10/15/2006    Qualifier: Diagnosis of  By: Linna Darner MD, [place]    . OSTEOPENIA 03/12/2008    Qualifier: Diagnosis of   By: Velora Heckler    . Rosacea 04/22/2009    Qualifier: Diagnosis of  By: Linna Darner MD, [place], HX OF 03/12/2008    Qualifier: Diagnosis of  By: Velora Heckler    . Vaginal atrophy     sees Dr. Gertie Fey    Past Surgical History  Procedure Laterality Date  . Total abdominal hysterectomy w/ bilateral salpingoophorectomy      for Endometriosis   . Colonoscopy      with Tics (no further f/u as per GI due to age)   . Cataract extraction, bilateral    . Nose surgery      due to trama at age 84   . Mohs surgery      right nostril for basel cell cancer   . Lt knee       meniscus tear  . Knee arthroplasty  05/18/2012    Procedure: COMPUTER ASSISTED TOTAL KNEE ARTHROPLASTY;  Surgeon: Marybelle Killings, MD;  Location: [place];  Service: Orthopedics;  Laterality: Left;  Left Total Knee Arthroplasty, Cemented, Computer Assisted  . Bunionectomy Bilateral   . Appendectomy    . Total shoulder arthroplasty Right 09/11/2013    Procedure: TOTAL SHOULDER ARTHROPLASTY;  Surgeon: Marybelle Killings, MD;  Location: [place];  Service: Orthopedics;  Laterality: Right;  Right Total Shoulder Arthroplasty    Family History  Problem  Relation Age of Onset  . Colon cancer Other     1st Cousin   . Stroke Mother 55  . Diabetes Brother   . Atrial fibrillation Brother     coumadin  . Lung cancer Sister     Social History   Social History  . Marital Status: Widowed    Spouse Name: N/A  . Number of Children: N/A  . Years of Education: N/A   Social History Main Topics  . Smoking status: Never Smoker   . Smokeless tobacco: Never Used  . Alcohol Use: No  . Drug Use: No  . Sexual Activity: Not Asked   Other Topics Concern  . 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  B Complex-C (B-COMPLEX WITH VITAMIN C) tablet, Take 1 tablet by mouth daily., Disp: , Rfl:  .  beta carotene w/minerals (OCUVITE) tablet, Take 1 tablet by mouth daily., Disp: , Rfl:  .  calcium carbonate (TUMS - DOSED IN MG ELEMENTAL CALCIUM) 500 MG chewable tablet, Chew 1 tablet by mouth daily., Disp: , Rfl:  .  cholecalciferol (VITAMIN D) 1000 UNITS tablet, Take 1,000 Units by mouth daily. , Disp: , Rfl:  .  doxycycline (VIBRAMYCIN) 100 MG capsule, Take 1 capsule (100 mg total) by mouth 2 (two) times daily., Disp: 20 capsule, Rfl: 0 .  fluticasone (FLONASE) 50 MCG/ACT nasal spray, Place 1 spray into both nostrils daily., Disp: 16 g, Rfl: 6 .  Magnesium 250 MG TABS, Take 250 mg by mouth at bedtime. , Disp: , Rfl:  .  meclizine (ANTIVERT) 12.5 MG tablet, Take 1 tablet (12.5 mg total) by mouth 3 (three) times daily as needed for dizziness., Disp: 30 tablet, Rfl: 0 .  nystatin-triamcinolone (MYCOLOG II) cream, Apply 1 application topically as needed., Disp: , Rfl:  .  OVER THE COUNTER MEDICATION, Take 1 capsule by mouth 2 (two) times daily. [place]7 daily., Disp: , Rfl:  .  vitamin C (ASCORBIC ACID) 500 MG tablet, Take 500 mg by mouth 2 (two) times daily., Disp: , Rfl:  .  vitamin E (VITAMIN E) 400 UNIT capsule, Take 400 Units by mouth daily., Disp: , Rfl:   EXAM:  Filed Vitals:   11/07/15 1132  BP: 120/60  Pulse: 75  Temp: 97.4 F (36.3 C)    Body mass index is 23.71 kg/(m^2).  GENERAL: vitals reviewed and listed above, alert, oriented, appears well hydrated and in no acute distress  HEENT: atraumatic, conjunttiva clear, no obvious abnormalities on inspection of external nose and ears  NECK: no obvious masses on inspection  LUNGS: clear to  auscultation bilaterally, no wheezes, rales or rhonchi, good air movement  CV: HRRR, no peripheral edema  GU: Thin and erythematous skin in the vulvar and external vaginal region. No thickening or scaling of the skin. No abnormal vaginal discharge.  MS: moves all extremities without noticeable abnormality  PSYCH: pleasant and cooperative, no obvious depression or anxiety  ASSESSMENT AND PLAN:  Discussed the following assessment and plan:  Dysuria - Plan: POC Urinalysis Dipstick, Culture, Urine, Urine Microscopic Only  Estrogen deficiency  Rash and nonspecific skin eruption  -She gave a lengthy description of this problem and is very concerned about it -She has seen several specialists including gynecologist and urologist -Urine dip looks good today, she is concerned for persistent UTI - will check culture to rule out -Query steroid atrophy given frequent use of the topical steroid for a long time, query mild yeast overgrowth given recent antibiotic use -Opted to stop topical steroid, treat with topocal azole, no soaps or cleansers, consider dermatology evaluation for biopsy persists -Continue treatment for estrogen deficiency with gynecologist -Follow up for physical and recheck in 1 month -Patient advised to return or notify a doctor immediately if symptoms worsen or persist or new concerns arise.  Patient Instructions  BEFORE YOU LEAVE: -urine dip, micro and culture -follow up: Please help her to schedule her Medicare visit in one month. Okay to work in if needed.   Please stop the Mycolog cream for at least 1 month.  Continue your Premarin cream. Only use as instructed by her gynecologist.  We did urine studies today and we'll contact you about the results if they are abnormal.  Please use clotrimazole cream (available over-the-counter) 1-2 times daily for 2 weeks.  Please do not use any soaps or lotions or other products other than those listed above on the skin in this  area. Only wash with plain water.      Colin Benton R., DO

## 2015-11-09 LAB — URINE CULTURE

## 2015-11-13 ENCOUNTER — Other Ambulatory Visit: Payer: Self-pay | Admitting: Family Medicine

## 2015-11-13 DIAGNOSIS — R300 Dysuria: Secondary | ICD-10-CM

## 2015-11-13 DIAGNOSIS — R350 Frequency of micturition: Secondary | ICD-10-CM

## 2015-11-14 NOTE — Addendum Note (Signed)
Addended by: Elmer Picker on: 11/14/2015 10:45 AM   Modules accepted: Orders

## 2015-11-18 LAB — URINE CULTURE: Colony Count: 75000

## 2015-11-18 MED ORDER — NITROFURANTOIN MONOHYD MACRO 100 MG PO CAPS: 100.0000 mg | ORAL_CAPSULE | Freq: Two times a day (BID) | ORAL | Status: DC

## 2015-11-18 NOTE — Addendum Note (Signed)
Addended by: Agnes Lawrence on: 11/18/2015 08:35 AM   Modules accepted: Orders

## 2015-11-25 ENCOUNTER — Telehealth: Payer: Self-pay | Admitting: Family Medicine

## 2015-11-25 ENCOUNTER — Other Ambulatory Visit: Payer: Medicare Other

## 2015-11-25 DIAGNOSIS — R300 Dysuria: Secondary | ICD-10-CM

## 2015-11-25 NOTE — Telephone Encounter (Signed)
The culture from her last urine showed that the abx she took should treat it. More antibiotics may make her symptoms worse. Repeat urine cx (lab appt) advised if still having symptoms. If neg advise f/u with her gynecologist.

## 2015-11-25 NOTE — Telephone Encounter (Signed)
Pt would like to have a sulfa antibiotic the macrobid did not work still having burning and aching in her vagina.  Pt will out for therapy today from 11:30-1:30 and pt going out of town 8/17 -8/31 and would like to feel better.

## 2015-11-25 NOTE — Telephone Encounter (Signed)
I called the pt and informed her of the message below.  Lab appt scheduled for today at 3:45pm.

## 2015-11-29 ENCOUNTER — Telehealth: Payer: Self-pay | Admitting: Family Medicine

## 2015-11-29 LAB — URINE CULTURE

## 2015-11-29 MED ORDER — FLUCONAZOLE 150 MG PO TABS: ORAL_TABLET | ORAL | Status: DC

## 2015-11-29 MED ORDER — FLUCONAZOLE 150 MG PO TABS: 150.0000 mg | ORAL_TABLET | Freq: Once | ORAL | Status: DC

## 2015-11-29 NOTE — Telephone Encounter (Signed)
Pt following up on UA results. Pt states the vaginal infection has returned. Pt states she has redness and a little stinging/burning.  Pt would like a call back asap.  Pt has to leave at 11:30 and will return around 1:30.

## 2015-11-29 NOTE — Telephone Encounter (Signed)
I tried to call her. No Answer.The final urine culture has not yet resulted. Minimal bacteria. I think rather this is potential yeast from the antibiotic if more vaginal irritation and can try diflucan 150 once now and once in 3 days. If urinary symptoms or does not resolve with this let us know.

## 2015-11-29 NOTE — Telephone Encounter (Signed)
I called the pt and informed her of the message below.  She is aware Diflucan was sent to her pharmacy and will call back next week if she is not feeing better.

## 2015-12-04 ENCOUNTER — Telehealth: Payer: Self-pay | Admitting: Family Medicine

## 2015-12-04 NOTE — Telephone Encounter (Signed)
Pt took 2 fluconazole 7/21 and 1 tablet on 7/24. Finished Macrobid 11/24/15. She is going to try Aquaphor cream tonight due to the pharmacist telling her to d/c the hydrocortisone cream that she was using the night before. Please review. Annual Wellness is schedule for next Tuesday 8/1.

## 2015-12-04 NOTE — Telephone Encounter (Signed)
Left message for pt to call back  °

## 2015-12-04 NOTE — Telephone Encounter (Signed)
Can you review this in Dr. Julianne Rice Absence?

## 2015-12-04 NOTE — Telephone Encounter (Signed)
Patient Name: Heidi Moore  DOB: [birth year]    Initial Comment Caller states still has bacteria in urine, has been taking Fluconazole and Macrobid, asking if she can try Sulfa, pain when urinating   Nurse Assessment  Nurse: Raphael Gibney, RN, Vanita Ingles Date/Time (Eastern Time): 12/04/2015 9:24:03 AM  Confirm and document reason for call. If symptomatic, describe symptoms. You must click the next button to save text entered. ---Caller states she took fluconazole for 3 days for vaginal itching Nurse called her on Monday to see if she wanted antibiotic as her urine showed bacteria. She has completed 7 days of Macrobid in May and she saw GYN for follow up. Saw Dr. Maudie Mercury and was given 7 days of Macrobid on July 10 which she completed. Saw Dr. Maudie Mercury a week ago on Monday and urine specimen show more bacteria. She is wanting something stronger for her UTI. She is having burning when she urinates. Perineal area is red. No fever.  Has the patient traveled out of the country within the last 30 days? ---No  Does the patient have any new or worsening symptoms? ---Yes  Will a triage be completed? ---Yes  Related visit to physician within the last 2 weeks? ---Yes  Does the PT have any chronic conditions? (i.e. diabetes, asthma, etc.) ---No  Is this a behavioral health or substance abuse call? ---No     Guidelines    Guideline Title Affirmed Question Affirmed Notes  Urination Pain - Female Age > 22 years    Final Disposition User   See Physician within 24 Hours Stringer, RN, Vanita Ingles    Comments  Pt is having burning with urination and she does not want to come into the office as she was at the office a week ago on Monday and her urine specimen showed bacteria. She started macrobid on July 10 amd completed 7 days. She is wanting a stronger antibiotic called in. Please call pt back and let her know about antibiotics. She is wanting it sent to the [facility] on [address]   Referrals  [place] REFUSED   Disagree/Comply:  Disagree  Disagree/Comply Reason: Disagree with instructions

## 2015-12-04 NOTE — Telephone Encounter (Signed)
It seems like she has been treated for UTI with Macrobid, also noted Dr Maudie Mercury asked if she was interested in urology evaluation due to persistent dysuria.  Ucx x 2 grew some bacteria, < 100,000 CFU.  Burning with urination could be related to another problem: vaginitis (?candidiasis) or vaginal atrophy among some. I do not think she has had a visit for this problem.  She could try OTC vaginal Monistat. I think she needs to be seen , may need a pelvic exam.  Thanks, BJ

## 2015-12-05 ENCOUNTER — Telehealth: Payer: Self-pay | Admitting: Family Medicine

## 2015-12-05 MED ORDER — NITROFURANTOIN MONOHYD MACRO 100 MG PO CAPS: 100.0000 mg | ORAL_CAPSULE | Freq: Two times a day (BID) | ORAL | 0 refills | Status: DC

## 2015-12-05 NOTE — Telephone Encounter (Signed)
I am sorry she is not feeling better. Bactrim is a high risk antibiotic and it was not the recommended abx for the small amount of bacteria in her last culture. Advise she either: -follow up with her gynecologist -repeat urine culture to see if it grows anything -or do abx recommended by last cx (macrobid)  Regardless, if any further symptoms after this. I thinks she should see the urologist.

## 2015-12-05 NOTE — Addendum Note (Signed)
Addended by: Agnes Lawrence on: 12/05/2015 05:32 PM   Modules accepted: Orders

## 2015-12-05 NOTE — Telephone Encounter (Signed)
Patient Name: Heidi Moore DOB: 08/03/1919 Initial Comment Caller states mother c/o urinary pain and burning. Nurse Assessment Guidelines Guideline Title Affirmed Question Affirmed Notes Final Disposition User Comments dtr states she just spoke with her mom and she did talk to a nurse already, no further help necessary at this time

## 2015-12-05 NOTE — Telephone Encounter (Signed)
I called the pt and informed her of the options below.  She stated she prefers to take the [place] again and she is aware a 7-day course was sent to her pharmacy.  I also advised her to keep the physical appt with Dr Maudie Mercury and she agreed.

## 2015-12-05 NOTE — Telephone Encounter (Signed)
Pt would like to have sulfa bactrium b/c she is having burning and aching after she urinates.  Pt state that the bladder infection is getting worse.  Pt would like to have a call.   Pharm:  [facility].

## 2015-12-09 NOTE — Progress Notes (Signed)
Medicare Annual Preventive Care Visit  (initial annual wellness or annual wellness exam)  Concerns and/or follow up today:  Vulvovaginal irritation and chronic dysuria: -started in 2014 after knee surgery  -has seen gyn and urology - has tried premarin cr, nystatin/triam cream which she used excessively for a year, several courses of abx - all without relief -at her last visit, I advised stopping the mycolog, continuing the premarin per gyn recs, clotrimazole, and avoidance of soaps, scratching, washing, lotions or other products  -she chronically intermittently has minimal amounts of bacteria on urine cultures - but does not feel better after taking appropriate abx - felt she had another UTI recently and demanded abx, thinks feels better -she feels she is doing better now but still some  vulvovag irritation - reports is SCRUBBING twice daily with water and TP and is applying aquaphor  ROS: negative for report of fevers, unintentional weight loss, vision changes, vision loss, hearing loss or change, chest pain, sob, hemoptysis, melena, hematochezia, hematuria, genital discharge or lesions, falls, bleeding or bruising, loc, thoughts of suicide or self harm, memory loss  1.) Patient-completed health risk assessment  - completed and reviewed, see scanned documentation  2.) Review of Medical History: -PMH, PSH, Family History and current specialty and care providers reviewed and updated and listed below  - see scanned in document in chart and below  Past Medical History:  Diagnosis Date  . AC (acromioclavicular) joint bone spurs    on right shoulder   . Allergic rhinitis   . Arthritis   . Bursitis    right shoulder  . Complication of anesthesia    headaches  . Diverticulosis   . DIVERTICULOSIS, COLON 10/15/2006   Qualifier: Diagnosis of  By: Linna Darner MD, Maryjane Hurter)    sinus HA  . History of blood transfusion 79yrs ago   no abnormal reaction noted  . History of cystitis    . History of skin cancer    Basal cell, Dr. Lindwood Coke  . Hyperlipidemia    [place] Study LDL goal =<160   . Hypotension   . Joint pain   . Joint swelling   . Macular degeneration, dry   . Osteopenia    Intolerance to Calcium,Actonel,Fosamax,Evista, Forteo: S/P Boniva 2007 to 03/2010  . OSTEOPENIA 03/12/2008   Qualifier: Diagnosis of  By: Velora Heckler    . Osteoporosis   . PONV (postoperative nausea and vomiting)   . Rosacea 04/22/2009   Qualifier: Diagnosis of  By: Linna Darner MD, [place], HX OF 03/12/2008   Qualifier: Diagnosis of  By: Velora Heckler    . Urinary frequency   . Vaginal atrophy    sees Dr. Gertie Fey    Past Surgical History:  Procedure Laterality Date  . APPENDECTOMY    . BUNIONECTOMY Bilateral   . CATARACT EXTRACTION, BILATERAL    . COLONOSCOPY     with Tics (no further f/u as per GI due to age)   . KNEE ARTHROPLASTY  05/18/2012   Procedure: COMPUTER ASSISTED TOTAL KNEE ARTHROPLASTY;  Surgeon: Marybelle Killings, MD;  Location: [facility];  Service: Orthopedics;  Laterality: Left;  Left Total Knee Arthroplasty, Cemented, Computer Assisted  . lt knee      meniscus tear  . MOHS SURGERY     right nostril for basel cell cancer   . NOSE SURGERY     due to trama at age 89   . TOTAL ABDOMINAL HYSTERECTOMY W/  BILATERAL SALPINGOOPHORECTOMY     for Endometriosis   . TOTAL SHOULDER ARTHROPLASTY Right 09/11/2013   Procedure: TOTAL SHOULDER ARTHROPLASTY;  Surgeon: Marybelle Killings, MD;  Location: [facility];  Service: Orthopedics;  Laterality: Right;  Right Total Shoulder Arthroplasty    Social History   Social History  . Marital status: Widowed    Spouse name: N/A  . Number of children: N/A  . Years of education: N/A   Occupational History  . Not on file.   Social History Main Topics  . Smoking status: Never Smoker  . Smokeless tobacco: Never Used  . Alcohol use No  . Drug use: No  . Sexual activity: Not on file   Other Topics Concern  . 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 Colon cancer Other     1st Cousin   . Stroke Mother 74  . Diabetes Brother   . Atrial fibrillation Brother     coumadin  . Lung cancer Sister     Current Outpatient Prescriptions on File Prior to Visit  Medication Sig Dispense Refill  . B Complex-C (B-COMPLEX WITH VITAMIN C) tablet Take 1 tablet by mouth daily.    . beta carotene w/minerals (OCUVITE) tablet Take 1 tablet by mouth daily.    . calcium carbonate (TUMS - DOSED IN MG ELEMENTAL CALCIUM) 500 MG chewable tablet Chew 1 tablet by mouth daily.    . cholecalciferol (VITAMIN D) 1000 UNITS tablet Take 1,000 Units by mouth daily.     . Magnesium 250 MG TABS Take 250 mg by mouth at bedtime.     . meclizine (ANTIVERT) 12.5 MG tablet Take 1 tablet (12.5 mg total) by mouth 3 (three) times daily as needed for dizziness. 30 tablet 0  . nystatin-triamcinolone (MYCOLOG II) cream Apply 1 application topically as needed.    [name] OVER THE COUNTER MEDICATION Take 1 capsule by mouth 2 (two) times daily. [place]7 daily.    . vitamin C (ASCORBIC ACID) 500 MG tablet Take 500 mg by mouth 2 (two) times daily.    . vitamin E (VITAMIN E) 400 UNIT capsule Take 400 Units by mouth daily.     No current facility-administered medications on file prior to visit.      3.) Review of functional ability and level of safety:  Any difficulty hearing?  NO  History of falling?  NO  Any trouble with IADLs - using a phone, using  transportation, grocery shopping, preparing meals, doing housework, doing laundry, taking medications and managing money? NO  Advance Directives? NO  See summary of recommendations in Patient Instructions below.  4.) Physical Exam Vitals:   12/10/15 1126  BP: 110/70  Pulse: 89  Temp: 98.3 F (36.8 C)   Estimated body mass index is 23.29 kg/m as calculated from the following:   Height as of this encounter:  5' 1.25" (1.556 m).   Weight as of this encounter: 124 lb 4.8 oz (56.4 kg).  EKG (optional): deferred  General: alert, appear well hydrated and in no acute distress  HEENT: visual acuity grossly intact  CV: HRRR  Lungs: CTA bilaterally  Psych: pleasant and cooperative, no obvious depression or anxiety  Cognitive function grossly intact  See patient instructions for recommendations.  Education and counseling regarding the above review of health provided with a plan for the following: -see scanned patient completed form for further details -fall prevention strategies discussed  -healthy lifestyle discussed -importance and resources for completing advanced directives discussed -see patient instructions below for any other recommendations provided  4)The following written screening schedule of preventive measures were reviewed with assessment and plan made per below, orders and patient instructions:      AAA screening done if applicable     Alcohol screening done     Obesity Screening and counseling done     STI screening (Hep C if born [birth year]) offered and per pt wishes     Tobacco Screening done done       Pneumococcal (PPSV23 -one dose after 64, one before if risk factors), influenza yearly and hepatitis B vaccines (if high risk - end stage renal disease, IV drugs, homosexual men, live in home for mentally retarded, hemophilia receiving factors) ASSESSMENT/PLAN: done if applicable      Screening mammograph (yearly if >40) ASSESSMENT/PLAN: seeing gyn for  women's health      Screening Pap smear/pelvic exam (q2 years) ASSESSMENT/PLAN: n/a, declined      Prostate cancer screening ASSESSMENT/PLAN: n/a, declined      Colorectal cancer screening (FOBT yearly or flex sig q4y or colonoscopy q10y or barium enema q4y) ASSESSMENT/PLAN: n/a      Diabetes outpatient self-management training services ASSESSMENT/PLAN: labs today      Bone mass measurements(covered q2y if indicated - estrogen def, osteoporosis, hyperparathyroid, vertebral abnormalities, osteoporosis or steroids) ASSESSMENT/PLAN: utd or discussed and ordered per pt wishes      Screening for glaucoma(q1y if high risk - diabetes, FH, AA and > 50 or hispanic and > 65) ASSESSMENT/PLAN: utd or advised      Medical nutritional therapy for individuals with diabetes or renal disease ASSESSMENT/PLAN: see orders      Cardiovascular screening blood tests (lipids q5y) ASSESSMENT/PLAN: see orders and labs      Diabetes screening tests ASSESSMENT/PLAN: see orders and labs   7.) Summary:  Medicare annual wellness visit, subsequent - Plan: Lipid Panel, Hemoglobin A1c, CBC (no diff) -risk factors and conditions per above assessment were discussed and treatment, recommendations and referrals were offered per documentation above and orders and patient instructions.  Vulvovaginitis -likely combo vag atrophy, floral imbalance secondary to abx, steroid atrophy and trauma from excessive cleansing , -advised no scrubbing or cleaning except with water in shower only, advised no pads and cotton undergarments -advised no more steroid or other products other then premarin per gyn recs -follow up with gyn if vaginal symptoms, follow up with urologist if dysuria  Patient Instructions  BEFORE YOU LEAVE: -follow up: yearly and as needed -labs  Follow up with your gynecologist if any further vaginal irritation. Follow up with your urologist if any further urinary symptoms.  We recommend the following  healthy lifestyle: 1) Small portions - eat off of salad plate instead of dinner plate 2) Eat a healthy clean diet with avoidance of (less then 1 serving per week) processed foods, sweetened drinks, white starches,  red meat, fast foods and sweets and consisting of: * 5-9 servings per day of fresh or frozen fruits and vegetables (not corn or potatoes, not dried or canned) *nuts and seeds, beans *olives and olive oil *small portions of lean meats such as fish and white chicken  *small portions of whole grains 3)Get at least 150 minutes of sweaty aerobic exercise per week 4)reduce stress - counseling, meditation, relaxation to balance other aspects of your life  We have ordered labs or studies at this visit. It can take up to 1-2 weeks for results and processing. IF results require follow up or explanation, we will call you with instructions. Clinically stable results will be released to your [facility]. If you have not heard from [place] or cannot find your results in [facility] in 2 weeks please contact our office at [phone].  If you are not yet signed up for [facility], please consider signing up.           Colin Benton R., DO

## 2015-12-10 ENCOUNTER — Encounter: Payer: Self-pay | Admitting: Family Medicine

## 2015-12-10 ENCOUNTER — Ambulatory Visit (INDEPENDENT_AMBULATORY_CARE_PROVIDER_SITE_OTHER): Payer: Medicare Other | Admitting: Family Medicine

## 2015-12-10 VITALS — BP 110/70 | HR 89 | Temp 98.3°F | Ht 61.25 in | Wt 124.3 lb

## 2015-12-10 DIAGNOSIS — Z0000 Encounter for general adult medical examination without abnormal findings: Secondary | ICD-10-CM | POA: Diagnosis not present

## 2015-12-10 DIAGNOSIS — N760 Acute vaginitis: Secondary | ICD-10-CM

## 2015-12-10 LAB — LIPID PANEL
CHOL/HDL RATIO: 3
Cholesterol: 187 mg/dL (ref 0–200)
HDL: 69.7 mg/dL (ref >39.00)
LDL Cholesterol: 105 mg/dL — ABNORMAL HIGH (ref 0–99)
NONHDL: 117.54
TRIGLYCERIDES: 63 mg/dL (ref 0.0–149.0)
VLDL: 12.6 mg/dL (ref 0.0–40.0)

## 2015-12-10 LAB — CBC
HEMATOCRIT: 42.3 % (ref 36.0–46.0)
Hemoglobin: 14.1 g/dL (ref 12.0–15.0)
MCHC: 33.4 g/dL (ref 30.0–36.0)
MCV: 90.7 fl (ref 78.0–100.0)
PLATELETS: 207 10*3/uL (ref 150.0–400.0)
RBC: 4.66 Mil/uL (ref 3.87–5.11)
RDW: 14.4 % (ref 11.5–15.5)
WBC: 6.6 10*3/uL (ref 4.0–10.5)

## 2015-12-10 LAB — HEMOGLOBIN A1C: Hgb A1c MFr Bld: 5.3 % (ref 4.6–6.5)

## 2015-12-10 NOTE — Progress Notes (Signed)
Pre visit review using our clinic review tool, if applicable. No additional management support is needed unless otherwise documented below in the visit note. 

## 2015-12-10 NOTE — Patient Instructions (Signed)
BEFORE YOU LEAVE: -follow up: yearly and as needed -labs  Follow up with your gynecologist if any further vaginal irritation. Follow up with your urologist if any further urinary symptoms.  We recommend the following healthy lifestyle: 1) Small portions - eat off of salad plate instead of dinner plate 2) Eat a healthy clean diet with avoidance of (less then 1 serving per week) processed foods, sweetened drinks, white starches, red meat, fast foods and sweets and consisting of: * 5-9 servings per day of fresh or frozen fruits and vegetables (not corn or potatoes, not dried or canned) *nuts and seeds, beans *olives and olive oil *small portions of lean meats such as fish and white chicken  *small portions of whole grains 3)Get at least 150 minutes of sweaty aerobic exercise per week 4)reduce stress - counseling, meditation, relaxation to balance other aspects of your life  We have ordered labs or studies at this visit. It can take up to 1-2 weeks for results and processing. IF results require follow up or explanation, we will call you with instructions. Clinically stable results will be released to your [facility]. If you have not heard from [place] or cannot find your results in [facility] in 2 weeks please contact our office at [phone].  If you are not yet signed up for [facility], please consider signing up.

## 2016-04-20 ENCOUNTER — Encounter: Payer: Self-pay | Admitting: Family Medicine

## 2016-04-20 ENCOUNTER — Ambulatory Visit (INDEPENDENT_AMBULATORY_CARE_PROVIDER_SITE_OTHER): Payer: Medicare Other | Admitting: Family Medicine

## 2016-04-20 VITALS — BP 122/62 | HR 78 | Temp 97.8°F | Ht 61.25 in | Wt 124.5 lb

## 2016-04-20 DIAGNOSIS — J069 Acute upper respiratory infection, unspecified: Secondary | ICD-10-CM

## 2016-04-20 DIAGNOSIS — B9789 Other viral agents as the cause of diseases classified elsewhere: Secondary | ICD-10-CM

## 2016-04-20 MED ORDER — BENZONATATE 100 MG PO CAPS: 100.0000 mg | ORAL_CAPSULE | Freq: Two times a day (BID) | ORAL | 0 refills | Status: DC | PRN

## 2016-04-20 NOTE — Patient Instructions (Signed)
INSTRUCTIONS FOR UPPER RESPIRATORY INFECTION:  -plenty of rest and fluids  -nasal saline wash 2-3 times daily (use prepackaged nasal saline or bottled/distilled water if making your own)   -can use tylenol (in no history of liver disease) as directed for aches and sorethroat  -in the winter time, using a humidifier at night is helpful (please follow cleaning instructions)  -if you are taking a cough medication - use only as directed, may also try a teaspoon of honey to coat the throat and throat lozenges. Can use the tessalon or the over the counter cough medication for the cough per instructions, but not more then every 8 hours.  -for sore throat, salt water gargles can help  -follow up if you have fevers, facial pain, tooth pain, difficulty breathing or are worsening or symptoms persist longer then expected  Upper Respiratory Infection, Adult An upper respiratory infection (URI) is also known as the common cold. It is often caused by a type of germ (virus). Colds are easily spread (contagious). You can pass it to others by kissing, coughing, sneezing, or drinking out of the same glass. Usually, you get better in 1 to 3  weeks.  However, the cough can last for even longer. HOME CARE   Only take medicine as told by your doctor. Follow instructions provided above.  Drink enough water and fluids to keep your pee (urine) clear or pale yellow.  Get plenty of rest.  Return to work when your temperature is < 100 for 24 hours or as told by your doctor. You may use a face mask and wash your hands to stop your cold from spreading. GET HELP RIGHT AWAY IF:   After the first few days, you feel you are getting worse.  You have questions about your medicine.  You have chills, shortness of breath, or red spit (mucus).  You have pain in the face for more then 1-2 days, especially when you bend forward.  You have a fever, puffy (swollen) neck, pain when you swallow, or white spots in the back of  your throat.  You have a bad headache, ear pain, sinus pain, or chest pain.  You have a high-pitched whistling sound when you breathe in and out (wheezing).  You cough up blood.  You have sore muscles or a stiff neck. MAKE SURE YOU:   Understand these instructions.  Will watch your condition.  Will get help right away if you are not doing well or get worse. Document Released: 10/14/2007 Document Revised: 07/20/2011 Document Reviewed: 08/02/2013 [facility] Patient Information 2015 [place], [place]. This information is not intended to replace advice given to you by your health care provider. Make sure you discuss any questions you have with your health care provider.

## 2016-04-20 NOTE — Addendum Note (Signed)
Addended by: Agnes Lawrence on: 04/20/2016 03:58 PM   Modules accepted: Orders

## 2016-04-20 NOTE — Progress Notes (Signed)
Pre visit review using our clinic review tool, if applicable. No additional management support is needed unless otherwise documented below in the visit note. 

## 2016-04-20 NOTE — Progress Notes (Signed)
HPI:  Acute visit for "laryngitis": -started: 4-5 days ago -symptoms: clear nasal congestion, sore throat, cough, laryngitis -denies:fever, SOB, NVD, tooth pain, body aches -has tried: otc cough medication -sick contacts/travel/risks: no reported flu, strep or tick exposure  ROS: See pertinent positives and negatives per HPI.  Past Medical History:  Diagnosis Date  . AC (acromioclavicular) joint bone spurs    on right shoulder   . Allergic rhinitis   . Arthritis   . Bursitis    right shoulder  . Complication of anesthesia    headaches  . Diverticulosis   . DIVERTICULOSIS, COLON 10/15/2006   Qualifier: Diagnosis of  By: Linna Darner MD, Maryjane Hurter)    sinus HA  . History of blood transfusion 80yrs ago   no abnormal reaction noted  . History of cystitis   . History of skin cancer    Basal cell, Dr. Lindwood Coke  . Hyperlipidemia    [place] Study LDL goal =<160   . Hypotension   . Joint pain   . Joint swelling   . Macular degeneration, dry   . Osteopenia    Intolerance to Calcium,Actonel,Fosamax,Evista, Forteo: S/P Boniva 2007 to 03/2010  . OSTEOPENIA 03/12/2008   Qualifier: Diagnosis of  By: Velora Heckler    . Osteoporosis   . PONV (postoperative nausea and vomiting)   . Rosacea 04/22/2009   Qualifier: Diagnosis of  By: Linna Darner MD, [place], HX OF 03/12/2008   Qualifier: Diagnosis of  By: Velora Heckler    . Urinary frequency   . Vaginal atrophy    sees Dr. Gertie Fey    Past Surgical History:  Procedure Laterality Date  . APPENDECTOMY    . BUNIONECTOMY Bilateral   . CATARACT EXTRACTION, BILATERAL    . COLONOSCOPY     with Tics (no further f/u as per GI due to age)   . KNEE ARTHROPLASTY  05/18/2012   Procedure: COMPUTER ASSISTED TOTAL KNEE ARTHROPLASTY;  Surgeon: Marybelle Killings, MD;  Location: [place];  Service: Orthopedics;  Laterality: Left;  Left Total Knee Arthroplasty, Cemented, Computer Assisted  . lt knee      meniscus tear  .  MOHS SURGERY     right nostril for basel cell cancer   . NOSE SURGERY     due to trama at age 11   . TOTAL ABDOMINAL HYSTERECTOMY W/ BILATERAL SALPINGOOPHORECTOMY     for Endometriosis   . TOTAL SHOULDER ARTHROPLASTY Right 09/11/2013   Procedure: TOTAL SHOULDER ARTHROPLASTY;  Surgeon: Marybelle Killings, MD;  Location: [place];  Service: Orthopedics;  Laterality: Right;  Right Total Shoulder Arthroplasty    Family History  Problem Relation Age of Onset  . Colon cancer Other     1st Cousin   . Stroke Mother 17  . Diabetes Brother   . Atrial fibrillation Brother     coumadin  . Lung cancer Sister     Social History   Social History  . Marital status: Widowed    Spouse name: N/A  . Number of children: N/A  . Years of education: N/A   Social History Main Topics  . Smoking status: Never Smoker  . Smokeless tobacco: Never Used  . Alcohol use No  . Drug use: No  . Sexual activity: Not Asked   Other Topics Concern  . 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  B Complex-C (B-COMPLEX WITH VITAMIN C) tablet, Take 1 tablet by mouth daily., Disp: , Rfl:  .  beta carotene w/minerals (OCUVITE) tablet, Take 1 tablet by mouth daily., Disp: , Rfl:  .  calcium carbonate (TUMS - DOSED IN MG ELEMENTAL CALCIUM) 500 MG chewable tablet, Chew 1 tablet by mouth daily., Disp: , Rfl:  .  cholecalciferol (VITAMIN D) 1000 UNITS tablet, Take 1,000 Units by mouth daily. , Disp: , Rfl:  .  Magnesium 250 MG TABS, Take 250 mg by mouth at bedtime. ,  Disp: , Rfl:  .  meclizine (ANTIVERT) 12.5 MG tablet, Take 1 tablet (12.5 mg total) by mouth 3 (three) times daily as needed for dizziness., Disp: 30 tablet, Rfl: 0 .  nystatin-triamcinolone (MYCOLOG II) cream, Apply 1 application topically as needed., Disp: , Rfl:  .  OVER THE COUNTER MEDICATION, Take 1 capsule by mouth 2 (two) times daily. [place]7 daily., Disp: , Rfl:  .  vitamin C (ASCORBIC ACID) 500 MG tablet, Take 500 mg by mouth 2 (two) times daily., Disp: , Rfl:  .  vitamin E (VITAMIN E) 400 UNIT capsule, Take 400 Units by mouth daily., Disp: , Rfl:   EXAM:  Vitals:   04/20/16 1531  BP: 122/62  Pulse: 78  Temp: 97.8 F (36.6 C)    Body mass index is 23.33 kg/m.  GENERAL: vitals reviewed and listed above, alert, oriented, appears well hydrated and in no acute distress  HEENT: atraumatic, conjunttiva clear, no obvious abnormalities on inspection of external nose and ears, normal appearance of ear canals and TMs, clear nasal congestion, mild post oropharyngeal erythema with PND, no tonsillar edema or exudate, no sinus TTP  NECK: no obvious masses on inspection  LUNGS: clear to auscultation bilaterally, no wheezes, rales or rhonchi, good air movement  CV: HRRR, no peripheral edema  MS: moves all extremities without noticeable abnormality  PSYCH: pleasant and cooperative, no obvious depression or anxiety  ASSESSMENT AND PLAN:  Discussed the following assessment and plan:  Viral upper respiratory infection  -given HPI and exam findings today, a serious infection or illness is unlikely. We discussed potential etiologies, with VURI being most likely, and advised supportive care and monitoring. Opted to try Tessalon for cough. We discussed treatment side effects, likely course, antibiotic misuse, transmission, and signs of developing a serious illness. -of course, we advised to return or notify a doctor immediately if symptoms worsen or persist or new concerns  arise.    Patient Instructions  INSTRUCTIONS FOR UPPER RESPIRATORY INFECTION:  -plenty of rest and fluids  -nasal saline wash 2-3 times daily (use prepackaged nasal saline or bottled/distilled water if making your own)   -can use tylenol (in no history of liver disease) as directed for aches and sorethroat  -in the winter time, using a humidifier at night is helpful (please follow cleaning instructions)  -if you are taking a cough medication - use only as directed, may also try a  teaspoon of honey to coat the throat and throat lozenges. Can use the tessalon or the over the counter cough medication for the cough per instructions, but not more then every 8 hours.  -for sore throat, salt water gargles can help  -follow up if you have fevers, facial pain, tooth pain, difficulty breathing or are worsening or symptoms persist longer then expected  Upper Respiratory Infection, Adult An upper respiratory infection (URI) is also known as the common cold. It is often caused by a type of germ (virus). Colds are easily spread (contagious). You can pass it to others by kissing, coughing, sneezing, or drinking out of the same glass. Usually, you get better in 1 to 3  weeks.  However, the cough can last for even longer. HOME CARE   Only take medicine as told by your doctor. Follow instructions provided above.  Drink enough water and fluids to keep your pee (urine) clear or pale yellow.  Get plenty of rest.  Return to work when your temperature is < 100 for 24 hours or as told by your doctor. You may use a face mask and wash your hands to stop your cold from spreading. GET HELP RIGHT AWAY IF:   After the first few days, you feel you are getting worse.  You have questions about your medicine.  You have chills, shortness of breath, or red spit (mucus).  You have pain in the face for more then 1-2 days, especially when you bend forward.  You have a fever, puffy (swollen) neck, pain when you  swallow, or white spots in the back of your throat.  You have a bad headache, ear pain, sinus pain, or chest pain.  You have a high-pitched whistling sound when you breathe in and out (wheezing).  You cough up blood.  You have sore muscles or a stiff neck. MAKE SURE YOU:   Understand these instructions.  Will watch your condition.  Will get help right away if you are not doing well or get worse. Document Released: 10/14/2007 Document Revised: 07/20/2011 Document Reviewed: 08/02/2013 [facility] Patient Information 2015 [place], [place]. This information is not intended to replace advice given to you by your health care provider. Make sure you discuss any questions you have with your health care provider.    Colin Benton R., DO

## 2016-04-29 ENCOUNTER — Telehealth: Payer: Self-pay | Admitting: Family Medicine

## 2016-04-29 NOTE — Telephone Encounter (Signed)
Pt would like to see if you could refill the benzonatate state that she is still coughing a lot.  Pharm:  [facility] on [address]  Would like to have a call if you are going to call it in so her daughter can pick it up.

## 2016-04-30 NOTE — Telephone Encounter (Signed)
I called the pt and informed her of the message below and she stated she is not coughing and has laryngitis.  Stated she will see how she is feeling and call back for an appt if needed.

## 2016-04-30 NOTE — Telephone Encounter (Signed)
I think we saw her 10 days ago. If still having a bad cough - it would be best for [place] to see her today or tomorrow to listen to her lungs and make sure they still sound good. Thanks!

## 2016-06-12 ENCOUNTER — Encounter: Payer: Self-pay | Admitting: Nurse Practitioner

## 2016-06-23 ENCOUNTER — Telehealth: Payer: Self-pay | Admitting: Family Medicine

## 2016-06-23 ENCOUNTER — Encounter: Payer: Self-pay | Admitting: Nurse Practitioner

## 2016-06-23 ENCOUNTER — Encounter (INDEPENDENT_AMBULATORY_CARE_PROVIDER_SITE_OTHER): Payer: Self-pay

## 2016-06-23 ENCOUNTER — Ambulatory Visit (INDEPENDENT_AMBULATORY_CARE_PROVIDER_SITE_OTHER): Payer: Medicare Other | Admitting: Nurse Practitioner

## 2016-06-23 ENCOUNTER — Other Ambulatory Visit: Payer: Medicare Other

## 2016-06-23 VITALS — BP 120/60 | HR 69 | Ht 61.25 in | Wt 125.0 lb

## 2016-06-23 DIAGNOSIS — R194 Change in bowel habit: Secondary | ICD-10-CM | POA: Diagnosis not present

## 2016-06-23 NOTE — Progress Notes (Signed)
HPI:  Patient is a 83 year old female previously followed by Dr. Delfin Edis. She had a colonoscopy in 2007 with findings of diverticulosis. Patient would like Dr. Silverio Decamp to assume her care going forward and she is here for evaluation of bowel changes. She has recently developed postprandial bowel movements varying in consistency from loose to solid and associated with gas and bloating. No blood in her stools. No fevers. She is aware of some triggers such as peppers, fried foods, and onions. No nocturnal bowel movements. She cannot recall any medication changes. She's tried probiotics but they made her symptoms worse. Gas-x helps.  In addition to increased frequency of stools, she has intermittent lower abdominal discomfort not necessarily related to the frequent bowel movements. She is followed by Urology for chronic dysuria    Past Medical History:  Diagnosis Date  . AC (acromioclavicular) joint bone spurs    on right shoulder   . Allergic rhinitis   . Arthritis   . Bursitis    right shoulder  . Complication of anesthesia    headaches  . Diverticulosis   . DIVERTICULOSIS, COLON 10/15/2006   Qualifier: Diagnosis of  By: Linna Darner MD, Maryjane Hurter)    sinus HA  . History of blood transfusion 41yrs ago   no abnormal reaction noted  . History of cystitis   . History of skin cancer    Basal cell, Dr. Lindwood Coke  . Hyperlipidemia    [place] Study LDL goal =<160   . Hypotension   . Joint pain   . Joint swelling   . Macular degeneration, dry   . Osteopenia    Intolerance to Calcium,Actonel,Fosamax,Evista, Forteo: S/P Boniva 2007 to 03/2010  . OSTEOPENIA 03/12/2008   Qualifier: Diagnosis of  By: Velora Heckler    . Osteoporosis   . PONV (postoperative nausea and vomiting)   . Rosacea 04/22/2009   Qualifier: Diagnosis of  By: Linna Darner MD, [place], HX OF 03/12/2008   Qualifier: Diagnosis of  By: Velora Heckler    . Urinary frequency   .  Vaginal atrophy    sees Dr. Gertie Fey    Past Surgical History:  Procedure Laterality Date  . APPENDECTOMY    . BUNIONECTOMY Bilateral   . CATARACT EXTRACTION, BILATERAL    . COLONOSCOPY     with Tics (no further f/u as per GI due to age)   . KNEE ARTHROPLASTY  05/18/2012   Procedure: COMPUTER ASSISTED TOTAL KNEE ARTHROPLASTY;  Surgeon: Marybelle Killings, MD;  Location: [place];  Service: Orthopedics;  Laterality: Left;  Left Total Knee Arthroplasty, Cemented, Computer Assisted  . lt knee      meniscus tear  . MOHS SURGERY     right nostril for basel cell cancer   . NOSE SURGERY     due to trama at age 47   . TOTAL ABDOMINAL HYSTERECTOMY W/ BILATERAL SALPINGOOPHORECTOMY     for Endometriosis   . TOTAL SHOULDER ARTHROPLASTY Right 09/11/2013   Procedure: TOTAL SHOULDER ARTHROPLASTY;  Surgeon: Marybelle Killings, MD;  Location: [place];  Service: Orthopedics;  Laterality: Right;  Right Total Shoulder Arthroplasty   Family History  Problem Relation Age of Onset  . Colon cancer Other     1st Cousin   . Stroke Mother 45  . Diabetes Brother   . Atrial fibrillation Brother     coumadin  . Lung cancer Sister    Social History  Substance Use Topics  . Smoking status: Never Smoker  . Smokeless tobacco: Never Used  . Alcohol use No   Current Outpatient Prescriptions  Medication Sig Dispense Refill  . B Complex-C (B-COMPLEX WITH VITAMIN C) tablet Take 1 tablet by mouth daily.    . benzonatate (TESSALON) 100 MG capsule Take 1 capsule (100 mg total) by mouth 2 (two) times daily as needed for cough. 20 capsule 0  . beta carotene w/minerals (OCUVITE) tablet Take 1 tablet by mouth daily.    . calcium carbonate (TUMS - DOSED IN MG ELEMENTAL CALCIUM) 500 MG chewable tablet Chew 1 tablet by mouth daily.    . cholecalciferol (VITAMIN D) 1000 UNITS tablet Take 1,000 Units by mouth daily.     . Magnesium 250 MG TABS Take 250 mg by mouth at bedtime.     . meclizine (ANTIVERT) 12.5 MG tablet Take 1 tablet (12.5 mg  total) by mouth 3 (three) times daily as needed for dizziness. 30 tablet 0  . nystatin-triamcinolone (MYCOLOG II) cream Apply 1 application topically as needed.    [name] OVER THE COUNTER MEDICATION Take 1 capsule by mouth 2 (two) times daily. [place]7 daily.    . vitamin C (ASCORBIC ACID) 500 MG tablet Take 500 mg by mouth 2 (two) times daily.    . vitamin E (VITAMIN E) 400 UNIT capsule Take 400 Units by mouth daily.     No current facility-administered medications for this visit.    Allergies  Allergen Reactions  . Alendronate Sodium Nausea And Vomiting  . Anaplex Hd Nausea And Vomiting  . Aspirin Nausea And Vomiting  . Azithromycin Nausea And Vomiting  . Calcium Nausea And Vomiting  . Codeine Nausea And Vomiting  . Doxycycline Nausea And Vomiting  . Hydrocodone Nausea And Vomiting  . Ibandronate Sodium     REACTION: severe body aches  . Ibuprofen Nausea And Vomiting  . Penicillins Nausea And Vomiting  . Raloxifene Nausea And Vomiting  . Risedronate Sodium Nausea And Vomiting  . Sulfonamide Derivatives Nausea And Vomiting  . Teriparatide Nausea And Vomiting    Review of Systems: Positive for seasonal allergies and arthritis  All other systems reviewed and negative except where noted in HPI.    Physical Exam: BP 120/60   Pulse 69   Ht 5' 1.25" (1.556 m)   Wt 125 lb (56.7 kg)   BMI 23.43 kg/m  Constitutional:  Well-developed, white female in no acute distress. Psychiatric: Normal mood and affect. Behavior is normal. HEENT: Normocephalic and atraumatic. Conjunctivae are normal. No scleral icterus. Neck supple.  Cardiovascular: Normal rate, regular rhythm.  Pulmonary/chest: Effort normal and breath sounds normal. No wheezing, rales or rhonchi. Abdominal: Soft, nondistended, nontender. Bowel sounds active throughout. There are no masses palpable. No hepatomegaly. Extremities: no edema Lymphadenopathy: No cervical adenopathy noted. Neurological: Alert and  oriented to person place and time. Skin: Skin is warm and dry. No rashes noted.   ASSESSMENT AND PLAN:   83 year old female with several month history of bowel changes / bloating / gas / mild ntermittent diffuse lower abdominal cramping. She is having increased frequency of stools, especially after meals. Stools vary in consistency between loose and formed. No blood in stool, no weight loss. She has been able to identify some triggers such as spicy foods, fatty foods and symptoms have since improved.  -doubt bowel changes are of infectious etiology but will at least check for c-diff and lactoferrin -Low gas diet literature given. Patient gives  a history of irritable bowel. I gave her a brochure on IBS. -She can continue the Gas-X. -I will call her with the results of stool studies and to get a condition update in a few days. -Follow up with me or Dr. Silverio Decamp as needed.   Tye Savoy, NP  06/23/2016, 11:17 AM

## 2016-06-23 NOTE — Telephone Encounter (Signed)
Pt came by the [place] office today with a list of her allergies. She is concerned as she states many of them are not correct. She stated she was told she had to come here to correct them as our former provider added them. She was last seen by Dr. Linna Darner ( now retired ) in 2013, so we did not think we should make any adjustments. She is concerned that the allergies for Aspirin, Ibuprofen, Penicillins, and Ibandronate Sodium are incorrectly listed and affecting her ability to get the medication she may need. I do not know this patient, but thought she might be a bit confused. Could your CMA call her to clarify or schedule a visit? Thank you.

## 2016-06-23 NOTE — Patient Instructions (Signed)
Please go to the basement level to have your labs drawn.   We have provided you with Gas and IBS brochures.   We will call you with the stool study results.

## 2016-06-24 NOTE — Telephone Encounter (Signed)
I believe that she can request changes to her medical records thru the medical records office? Can you check with medical records on this and assist her?

## 2016-06-25 ENCOUNTER — Other Ambulatory Visit: Payer: Medicare Other

## 2016-06-25 DIAGNOSIS — R194 Change in bowel habit: Secondary | ICD-10-CM

## 2016-06-25 NOTE — Telephone Encounter (Signed)
Heidi Moore you help with this?

## 2016-06-26 LAB — FECAL LACTOFERRIN, QUANT: LACTOFERRIN: NEGATIVE

## 2016-06-26 LAB — CLOSTRIDIUM DIFFICILE BY PCR: CDIFFPCR: NOT DETECTED

## 2016-06-26 NOTE — Telephone Encounter (Signed)
  Heidi Moore, please schedule Heidi Moore an appointment with me so that we attempt to address her concerns.  I am not sure whom sent her to [facility] and I am sorry for the inconvenience to you [name].  It is unlikely that I can make changes to her medical record and remove allergies that another doctor or individual placed in her chart prior to her seeing me. I am happy to talk with her about this. I do believe that patients do have the right to make requests for changes to be made to their medical record if they feel that information in their record was placed there in error. That is done through the medical records office.  Thank you.

## 2016-06-26 NOTE — Telephone Encounter (Signed)
Dr. Maudie Mercury, Medical Records is not going to change the patient's list of allergies/reactions. Maybe Denice Paradise could call and talk to the patient about her concerns, she was already sent to [facility] in error and I would hate for her to be sent to Medical Records as well.  As I mentioned, she is a bit confused so I would be hesitant for anyone other than a clinical staff member to make any changes to her list of allergies. Thanks, Almyra Free

## 2016-06-26 NOTE — Telephone Encounter (Signed)
I left a detailed message for the pt to call the office to schedule an appt with Dr Maudie Mercury per the information below.

## 2016-06-30 ENCOUNTER — Encounter: Payer: Self-pay | Admitting: Family Medicine

## 2016-06-30 ENCOUNTER — Ambulatory Visit (INDEPENDENT_AMBULATORY_CARE_PROVIDER_SITE_OTHER): Payer: Medicare Other | Admitting: Family Medicine

## 2016-06-30 VITALS — BP 132/62 | HR 85 | Temp 97.8°F | Ht 61.25 in | Wt 125.9 lb

## 2016-06-30 DIAGNOSIS — Z789 Other specified health status: Secondary | ICD-10-CM | POA: Diagnosis not present

## 2016-06-30 DIAGNOSIS — B9789 Other viral agents as the cause of diseases classified elsewhere: Secondary | ICD-10-CM

## 2016-06-30 DIAGNOSIS — J069 Acute upper respiratory infection, unspecified: Secondary | ICD-10-CM | POA: Diagnosis not present

## 2016-06-30 DIAGNOSIS — J3089 Other allergic rhinitis: Secondary | ICD-10-CM | POA: Diagnosis not present

## 2016-06-30 NOTE — Progress Notes (Signed)
HPI:  Acute visit for URI: -started 2 days ago after attending a funeral -reports hx sinusitis and prior doc used to give her abx -symptoms include clear nasal congestion, PND -no fever, body aches, sinus pain, SOB -reports last time she had this saw ENT and he gave cipro due to her listed abx intolerances - she wants me to remove all of these from her list though admits they either did cause stomach upset or she doesn't remember as some were added 10+ years ago by prior PCP  ROS: See pertinent positives and negatives per HPI.  Past Medical History:  Diagnosis Date  . AC (acromioclavicular) joint bone spurs    on right shoulder   . Allergic rhinitis   . Arthritis   . Bursitis    right shoulder  . Complication of anesthesia    headaches  . Diverticulosis   . DIVERTICULOSIS, COLON 10/15/2006   Qualifier: Diagnosis of  By: Linna Darner MD, Maryjane Hurter)    sinus HA  . History of blood transfusion 58yrs ago   no abnormal reaction noted  . History of cystitis   . History of skin cancer    Basal cell, Dr. Lindwood Coke  . Hyperlipidemia    [place] Study LDL goal =<160   . Hypotension   . Joint pain   . Joint swelling   . Macular degeneration, dry   . Osteopenia    Intolerance to Calcium,Actonel,Fosamax,Evista, Forteo: S/P Boniva 2007 to 03/2010  . OSTEOPENIA 03/12/2008   Qualifier: Diagnosis of  By: Velora Heckler    . Osteoporosis   . PONV (postoperative nausea and vomiting)   . Rosacea 04/22/2009   Qualifier: Diagnosis of  By: Linna Darner MD, [place], HX OF 03/12/2008   Qualifier: Diagnosis of  By: Velora Heckler    . Urinary frequency   . Vaginal atrophy    sees Dr. Gertie Fey    Past Surgical History:  Procedure Laterality Date  . APPENDECTOMY    . BUNIONECTOMY Bilateral   . CATARACT EXTRACTION, BILATERAL    . COLONOSCOPY     with Tics (no further f/u as per GI due to age)   . KNEE ARTHROPLASTY  05/18/2012   Procedure: COMPUTER ASSISTED TOTAL  KNEE ARTHROPLASTY;  Surgeon: Marybelle Killings, MD;  Location: [facility];  Service: Orthopedics;  Laterality: Left;  Left Total Knee Arthroplasty, Cemented, Computer Assisted  . lt knee      meniscus tear  . MOHS SURGERY     right nostril for basel cell cancer   . NOSE SURGERY     due to trama at age 65   . TOTAL ABDOMINAL HYSTERECTOMY W/ BILATERAL SALPINGOOPHORECTOMY     for Endometriosis   . TOTAL SHOULDER ARTHROPLASTY Right 09/11/2013   Procedure: TOTAL SHOULDER ARTHROPLASTY;  Surgeon: Marybelle Killings, MD;  Location: [facility];  Service: Orthopedics;  Laterality: Right;  Right Total Shoulder Arthroplasty    Family History  Problem Relation Age of Onset  . Colon cancer Other     1st Cousin   . Stroke Mother 2  . Diabetes Brother   . Atrial fibrillation Brother     coumadin  . Lung cancer Sister     Social History   Social History  . Marital status: Widowed    Spouse name: N/A  . Number of children: N/A  . Years of education: N/A   Social History Main Topics  . Smoking status: Never Smoker  .  Smokeless tobacco: Never Used  . Alcohol use No  . Drug use: No  . Sexual activity: Not Asked   Other Topics Concern  . 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  B Complex-C (B-COMPLEX WITH VITAMIN C) tablet, Take 1 tablet by mouth daily., Disp: , Rfl:  .  benzonatate (TESSALON) 100 MG capsule, Take 1 capsule (100 mg total) by mouth 2 (two) times daily as needed for cough., Disp: 20 capsule, Rfl: 0 .  beta carotene w/minerals  (OCUVITE) tablet, Take 1 tablet by mouth daily., Disp: , Rfl:  .  calcium carbonate (TUMS - DOSED IN MG ELEMENTAL CALCIUM) 500 MG chewable tablet, Chew 1 tablet by mouth daily., Disp: , Rfl:  .  cholecalciferol (VITAMIN D) 1000 UNITS tablet, Take 1,000 Units by mouth daily. , Disp: , Rfl:  .  Magnesium 250 MG TABS, Take 250 mg by mouth at bedtime. , Disp: , Rfl:  .  meclizine (ANTIVERT) 12.5 MG tablet, Take 1 tablet (12.5 mg total) by mouth 3 (three) times daily as needed for dizziness., Disp: 30 tablet, Rfl: 0 .  nystatin-triamcinolone (MYCOLOG II) cream, Apply 1 application topically as needed., Disp: , Rfl:  .  OVER THE COUNTER MEDICATION, Take 1 capsule by mouth 2 (two) times daily. Imperial7 daily., Disp: , Rfl:  .  vitamin C (ASCORBIC ACID) 500 MG tablet, Take 500 mg by mouth 2 (two) times daily., Disp: , Rfl:  .  vitamin E (VITAMIN E) 400 UNIT capsule, Take 400 Units by mouth daily., Disp: , Rfl:   EXAM:  Vitals:   06/30/16 1119  BP: 132/62  Pulse: 85  Temp: 97.8 F (36.6 C)    Body mass index is 23.59 kg/m.  GENERAL: vitals reviewed and listed above, alert, oriented, appears well hydrated and in no acute distress  HEENT: atraumatic, conjunttiva clear, no obvious abnormalities on inspection of external nose and ears, normal appearance of ear canals and TMs, clear nasal congestion, mild post oropharyngeal erythema with PND, no tonsillar edema or exudate, no sinus TTP  NECK: no obvious masses on inspection  LUNGS: clear to auscultation bilaterally, no wheezes, rales or rhonchi, good air movement  CV: HRRR, no peripheral edema  MS: moves all extremities without noticeable abnormality  PSYCH: pleasant and cooperative, no obvious depression or anxiety  ASSESSMENT AND PLAN:  Discussed the following assessment and plan:  Viral upper respiratory tract infection  Seasonal allergic rhinitis due to other allergic trigger, unspecified chronicity  Medication  intolerance  -symptoms today most c/w AR or mild VURI - discussed OTC treatment options, no sign of bacterial illness -advise leaving listed medication intolerance on list - she seems agreeable to this -she feels some of them our in error and advised if she feels sure they are in error she can request a change though medical records, but I do not advise this nor feel  comfortable taking them off her list given they were added a very long time ago and she may have forgotten and is helpful information if we need to prescribe them again -Patient advised to return or notify a doctor immediately if symptoms worsen or persist or new concerns arise.  Patient Instructions  Please use the nasal saline and flonase.  Follow up if worsening symptoms, new concerns or symptoms persist in 1 week.     Colin Benton R., DO

## 2016-06-30 NOTE — Patient Instructions (Signed)
Please use the nasal saline and flonase.  Follow up if worsening symptoms, new concerns or symptoms persist in 1 week.

## 2016-06-30 NOTE — Progress Notes (Signed)
Pre visit review using our clinic review tool, if applicable. No additional management support is needed unless otherwise documented below in the visit note. 

## 2016-07-06 NOTE — Progress Notes (Signed)
Reviewed and agree with documentation and assessment and plan. K. Veena Nandigam , MD   

## 2017-03-19 ENCOUNTER — Telehealth: Payer: Self-pay | Admitting: Family Medicine

## 2017-03-19 NOTE — Telephone Encounter (Signed)
Does not need referral to see ear nose and throat.  We could flush her ears out here if he prefers.  She wants to see ear nose and throat, please give her the number she can call.

## 2017-03-19 NOTE — Telephone Encounter (Signed)
Pt states she went to [facility] to have her hearing checked and they advised she has balls of wax caked at her eardrum. Pt would like a referral to an ENT (not Dr Ernesto Rutherford) to have this removed.

## 2017-03-22 NOTE — Telephone Encounter (Signed)
I called the pt and informed her of the message below and she was given the numbers for [facility] and Dr Pollie Friar office.

## 2017-03-24 NOTE — Progress Notes (Signed)
Subjective:   JOYA WILLMOTT is a 83 y.o. female who presents for Medicare Annual (Subsequent) preventive examination.  The Patient was informed that the wellness visit is to identify future health risk and educate and initiate measures that can reduce risk for increased disease through the lifespan.    Annual Wellness Assessment  Reports health as good Lives in home since 1;  One level home  Not going to an independent living  Does all her house work Driving and no issues Exercised all your life   Dtr and grand dtr live here Son in [place] One child in [place] to go to [place] for Christmas   Preventive Screening -Counseling & Management  Medicare Annual Preventive Care Visit - Subsequent Last OV 12/2015 AWV 2/20 URI Jan 14 TRK 2014;    There are no preventive care reminders to display for this patient. Had flu vaccine in sept with her dtr  Bone density 05/2010; -2.1 -  Dr. Gaetano Net did Bone density; told her it was better  Spine was fine  Mammogram 01/2012 stopped these   VS reviewed;   Diet  Does not cook much Microwaves Oatmeal and fruit for breakfast; BB; Rasberries (stays current on what one should eat) Yogurt and almonds 1/3 bagel with peanut butter  Lunch; Goes to [facility] and get chicken and vegetable Fixes baked chicken; homemade chili  4 cups of coffee and water with diet cranberry juice   BMI 22   Exercise Silver sneakers 3 times a week Works in the [employer] Can go up steps but down is more difficult Just planted [place] [employer] to chiropractor   Dental - had crowns put in  [place] cleans her teeth  Stressors: just watching TV   Sleep patterns: still adjusting to the time change   Pain- pain in her left knee occasionally    Cardiac Risk Factors Addressed Family hx stroke and DM  Hyperlipidemia - ratio 3; chol 187; HDL 69 and trig 63 Diabetes neg   Planning a party for her 89th Spouse 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 Gaetano Net GYN     Objective:     Vitals: BP 106/60   Pulse 81   Ht 5\' 1"  (1.549 m)   Wt 118 lb (53.5 kg)   SpO2 96%   BMI 22.30 kg/m   Body mass index is 22.3 kg/m.   Tobacco Social History   Tobacco Use  Smoking Status Never Smoker  Smokeless Tobacco Never Used     Counseling given: Yes   Past Medical History:  Diagnosis Date  . AC (acromioclavicular) joint bone spurs    on right shoulder   . Allergic rhinitis   . Arthritis   . Bursitis    right shoulder  . Complication of anesthesia    headaches  . Diverticulosis   . DIVERTICULOSIS, COLON 10/15/2006   Qualifier: Diagnosis of  By: Linna Darner MD, Maryjane Hurter)    sinus HA  . History of blood transfusion 60yrs ago   no abnormal reaction noted  . History of cystitis   . History of skin cancer    Basal cell, Dr. Lindwood Coke  . Hyperlipidemia    [place] Study LDL goal =<  160   . Hypotension   . Joint pain   . Joint swelling   . Macular degeneration, dry   . Osteopenia    Intolerance to Calcium,Actonel,Fosamax,Evista, Forteo: S/P Boniva 2007 to 03/2010  . OSTEOPENIA 03/12/2008   Qualifier: Diagnosis of  By: Velora Heckler    . Osteoporosis   . PONV (postoperative nausea and vomiting)   . Rosacea 04/22/2009   Qualifier: Diagnosis of  By: Linna Darner MD, [place], HX OF 03/12/2008   Qualifier: Diagnosis of  By: Velora Heckler    . Urinary frequency   . Vaginal atrophy    sees Dr. Gertie Fey   Past Surgical History:  Procedure Laterality Date  . APPENDECTOMY    . BUNIONECTOMY Bilateral   . CATARACT EXTRACTION, BILATERAL    . COLONOSCOPY     with Tics (no further f/u as per GI due to age)   . KNEE ARTHROPLASTY  05/18/2012   Procedure: COMPUTER ASSISTED TOTAL KNEE ARTHROPLASTY;  Surgeon: Marybelle Killings, MD;  Location: [facility];  Service: Orthopedics;  Laterality: Left;  Left Total Knee Arthroplasty, Cemented, Computer Assisted  . lt knee      meniscus tear  . MOHS SURGERY     right nostril for basel cell cancer   . NOSE SURGERY     due to trama at age 3   . TOTAL ABDOMINAL HYSTERECTOMY W/ BILATERAL SALPINGOOPHORECTOMY     for Endometriosis   . TOTAL SHOULDER ARTHROPLASTY Right 09/11/2013   Procedure: TOTAL SHOULDER ARTHROPLASTY;  Surgeon: Marybelle Killings, MD;  Location: [facility];  Service: Orthopedics;  Laterality: Right;  Right Total Shoulder Arthroplasty   Family History  Problem Relation Age of Onset  . Colon cancer Other        1st Cousin   . Stroke Mother 23  . Diabetes Brother   . Atrial fibrillation Brother        coumadin  . Lung cancer Sister    Social History   Substance and Sexual Activity  Sexual Activity Not on file    Outpatient Encounter Medications as of 03/25/2017  Medication Sig  . B Complex-C (B-COMPLEX WITH VITAMIN C) tablet Take 1 tablet by mouth daily.  . benzonatate (TESSALON) 100 MG capsule Take 1 capsule (100 mg total) by mouth 2 (two) times daily as needed for cough.  . beta carotene w/minerals (OCUVITE) tablet Take 1 tablet by mouth daily.  . cholecalciferol (VITAMIN D) 1000 UNITS tablet Take 1,000 Units by mouth daily.   . Magnesium 250 MG TABS Take 250 mg by mouth at bedtime.   . meclizine (ANTIVERT) 12.5 MG tablet Take 1 tablet (12.5 mg total) by mouth 3 (three) times daily as needed for dizziness.  . nystatin-triamcinolone (MYCOLOG II) cream Apply 1 application topically as needed.  [name] OVER THE COUNTER MEDICATION Take 1 capsule by mouth 2 (two) times daily. [place]7 daily.  . vitamin C (ASCORBIC ACID) 500 MG tablet Take 500 mg by mouth 2 (two) times daily.  . vitamin E (VITAMIN E) 400 UNIT capsule Take 400 Units by mouth daily.  . calcium carbonate (TUMS - DOSED IN MG ELEMENTAL CALCIUM) 500 MG chewable tablet Chew 1 tablet by mouth  daily.   No facility-administered encounter medications on file as of 03/25/2017.     Activities of Daily Living In your present state of health, do you have any difficulty performing the following activities: 03/25/2017  Hearing? N  Vision? N  Difficulty concentrating or making decisions? N  Walking or climbing stairs? N  Comment has trouble with some recall but otherwise talkes and engages easily   Dressing or bathing? N  Doing errands, shopping? N  Preparing Food and eating ? N  Using the Toilet? N  In the past six months, have you accidently leaked urine? N  Do you have problems with loss of bowel control? N  Managing your Medications? N  Managing your Finances? N  Housekeeping or managing your Housekeeping? N  Some recent data might be hidden    Patient Care Team: Lucretia Kern, DO as PCP - General (Family Medicine)    Assessment:   Exercise Activities and Dietary recommendations Current Exercise Habits: Home exercise routine;Structured exercise class, Type of exercise: strength training/weights;walking, Time (Minutes): 30, Frequency (Times/Week): 4(goes to silver sneaker class 3 times a week and walks w dtr daily ), Weekly Exercise (Minutes/Week): 120, Intensity: Moderate  Goals    None     Fall Risk Fall Risk  03/25/2017 11/07/2015 10/30/2013 09/02/2012  Falls in the past year? No Yes No No  Comment may of 2017; stepped off curb and road was uneven and she fell  - - -  Number falls in past yr: - 1 - -  Injury with Fall? - Yes - -   Depression Screen PHQ 2/9 Scores 03/25/2017 11/07/2015 10/30/2013 09/02/2012  PHQ - 2 Score 0 0 0 0     Cognitive Function MMSE - Mini Mental State Exam 03/25/2017  Not completed: (No Data)   Ad8 score reviewed for issues:  Issues making decisions:  Less interest in hobbies / activities:  Repeats questions, stories (family complaining):  Trouble using ordinary gadgets (microwave, computer, phone):  Forgets the month or year:    Mismanaging finances:   Remembering appts:  Daily problems with thinking and/or memory: Ad8 score is=0      Immunization History  Administered Date(s) Administered  . Influenza Split 02/13/2013  . Influenza Whole 01/31/2008  . Influenza, High Dose Seasonal PF 01/31/2016  . Influenza-Unspecified 01/09/2014, 01/24/2017  . Pneumococcal Polysaccharide-23 05/11/1996  . Td 05/11/1994  . Tdap 10/30/2013  . Zoster 11/04/2010   Screening Tests Health Maintenance  Topic Date Due  . TETANUS/TDAP  10/31/2023  . INFLUENZA VACCINE  Completed  . DEXA SCAN  Completed  . PNA vac Low Risk Adult  Addressed      Plan:      PCP Notes   Health Maintenance [facility] flu [facility] sept 16.2018 Educated on shingrix; had Zoster 10/2010 and does not feel she wants the shingrix  Eats very healthy Exercises 3 times a week and keeps her yard and home up   Abnormal Screens  Has hearing apt tomorrow for wax removal   Referrals  None   Patient concerns; None; discusses [place] treatment for vaginal dryness and this has worked well. Up at hs to go to the bathroom but no UA leaking   Nurse Concerns; As noted; get and go normal; very active   Next PCP apt TBS  Apt for AWV scheduled for next year        I have personally reviewed and noted the following in the patient's chart:   . Medical and social history . Use of alcohol, tobacco or illicit drugs  . Current medications and supplements . Functional ability and status . Nutritional status . Physical activity . Advanced directives . List of other physicians . Hospitalizations, surgeries, and ER  visits in previous 12 months . Vitals . Screenings to include cognitive, depression, and falls . Referrals and appointments  In addition, I have reviewed and discussed with patient certain preventive protocols, quality metrics, and best practice recommendations. A written personalized care plan for preventive services  as well as general preventive health recommendations were provided to patient.     Wynetta Fines, RN  03/25/2017

## 2017-03-25 ENCOUNTER — Ambulatory Visit (INDEPENDENT_AMBULATORY_CARE_PROVIDER_SITE_OTHER): Payer: Medicare Other

## 2017-03-25 VITALS — BP 106/60 | HR 81 | Ht 61.0 in | Wt 118.0 lb

## 2017-03-25 DIAGNOSIS — Z0000 Encounter for general adult medical examination without abnormal findings: Secondary | ICD-10-CM | POA: Diagnosis not present

## 2017-03-25 NOTE — Patient Instructions (Addendum)
[name] , Thank you for taking time to come for your Medicare Wellness Visit. I appreciate your ongoing commitment to your health goals. Please review the following plan we discussed and let me know if I can assist you in the future.   Great exercise plan!   Travels a lot with family, [place], [place], [place] etc  Shingrix is a vaccine for the prevention of Shingles in Adults 62 and older.  If you are on Medicare, you can request a prescription from your doctor to be filled at a pharmacy.  Please check with your benefits regarding applicable copays or out of pocket expenses.  The Shingrix is given in 2 vaccines approx 8 weeks apart. You must receive the 2nd dose prior to 6 months from receipt of the first.    These are the goals we discussed: keep maintaining her health  Goals    None      This is a list of the screening recommended for you and due dates:  Health Maintenance  Topic Date Due  . Flu Shot  12/09/2016  . Tetanus Vaccine  10/31/2023  . DEXA scan (bone density measurement)  Completed  . Pneumonia vaccines  Addressed   Prevention of falls: Remove rugs or any tripping hazards in the home Use Non slip mats in bathtubs and showers Placing grab bars next to the toilet and or shower Placing handrails on both sides of the stair way Adding extra lighting in the home.   Personal safety issues reviewed:  1. Consider starting a community watch program per [facility] 2.  Changes batteries is smoke detector and/or carbon monoxide detector  3.  If you have firearms; keep them in a safe place 4.  Wear protection when in the sun; Always wear sunscreen or a hat; It is good to have your doctor check your skin annually or review any new areas of concern 5. Driving safety; Keep in the right lane; stay 3 car lengths behind the car in front of you on the highway; look 3 times prior to pulling out; carry your cell phone everywhere you go!    Learn about the Yellow Dot program:   The program allows first responders at your emergency to have access to who your physician is, as well as your medications and medical conditions.  Citizens requesting the Yellow Dot Packages should contact Master Corporal Nunzio Cobbs at the [facility] [phone] for the first week of the program and beginning the week after Easter citizens should contact their Scientist, physiological.      Fall Prevention in the Home Falls can cause injuries. They can happen to people of all ages. There are many things you can do to make your home safe and to help prevent falls. What can I do on the outside of my home?  Regularly fix the edges of walkways and driveways and fix any cracks.  Remove anything that might make you trip as you walk through a door, such as a raised step or threshold.  Trim any bushes or trees on the path to your home.  Use bright outdoor lighting.  Clear any walking paths of anything that might make someone trip, such as rocks or tools.  Regularly check to see if handrails are loose or broken. Make sure that both sides of any steps have handrails.  Any raised decks and porches should have guardrails on the edges.  Have any leaves, snow, or ice cleared regularly.  Use  sand or salt on walking paths during winter.  Clean up any spills in your garage right away. This includes oil or grease spills. What can I do in the bathroom?  Use night lights.  Install grab bars by the toilet and in the tub and shower. Do not use towel bars as grab bars.  Use non-skid mats or decals in the tub or shower.  If you need to sit down in the shower, use a plastic, non-slip stool.  Keep the floor dry. Clean up any water that spills on the floor as soon as it happens.  Remove soap buildup in the tub or shower regularly.  Attach bath mats securely with double-sided non-slip rug tape.  Do not have throw rugs and other things on the floor that can make you  trip. What can I do in the bedroom?  Use night lights.  Make sure that you have a light by your bed that is easy to reach.  Do not use any sheets or blankets that are too big for your bed. They should not hang down onto the floor.  Have a firm chair that has side arms. You can use this for support while you get dressed.  Do not have throw rugs and other things on the floor that can make you trip. What can I do in the kitchen?  Clean up any spills right away.  Avoid walking on wet floors.  Keep items that you use a lot in easy-to-reach places.  If you need to reach something above you, use a strong step stool that has a grab bar.  Keep electrical cords out of the way.  Do not use floor polish or wax that makes floors slippery. If you must use wax, use non-skid floor wax.  Do not have throw rugs and other things on the floor that can make you trip. What can I do with my stairs?  Do not leave any items on the stairs.  Make sure that there are handrails on both sides of the stairs and use them. Fix handrails that are broken or loose. Make sure that handrails are as long as the stairways.  Check any carpeting to make sure that it is firmly attached to the stairs. Fix any carpet that is loose or worn.  Avoid having throw rugs at the top or bottom of the stairs. If you do have throw rugs, attach them to the floor with carpet tape.  Make sure that you have a light switch at the top of the stairs and the bottom of the stairs. If you do not have them, ask someone to add them for you. What else can I do to help prevent falls?  Wear shoes that: ? Do not have high heels. ? Have rubber bottoms. ? Are comfortable and fit you well. ? Are closed at the toe. Do not wear sandals.  If you use a stepladder: ? Make sure that it is fully opened. Do not climb a closed stepladder. ? Make sure that both sides of the stepladder are locked into place. ? Ask someone to hold it for you, if  possible.  Clearly mark and make sure that you can see: ? Any grab bars or handrails. ? First and last steps. ? Where the edge of each step is.  Use tools that help you move around (mobility aids) if they are needed. These include: ? Canes. ? Walkers. ? Scooters. ? Crutches.  Turn on the lights when you go into  a dark area. Replace any light bulbs as soon as they burn out.  Set up your furniture so you have a clear path. Avoid moving your furniture around.  If any of your floors are uneven, fix them.  If there are any pets around you, be aware of where they are.  Review your medicines with your doctor. Some medicines can make you feel dizzy. This can increase your chance of falling. Ask your doctor what other things that you can do to help prevent falls. This information is not intended to replace advice given to you by your health care provider. Make sure you discuss any questions you have with your health care provider. Document Released: 02/21/2009 Document Revised: 10/03/2015 Document Reviewed: 06/01/2014 Elsevier Interactive Patient Education  2018 [place] Maintenance, Female Adopting a healthy lifestyle and getting preventive care can go a long way to promote health and wellness. Talk with your health care provider about what schedule of regular examinations is right for you. This is a good chance for you to check in with your provider about disease prevention and staying healthy. In between checkups, there are plenty of things you can do on your own. Experts have done a lot of research about which lifestyle changes and preventive measures are most likely to keep you healthy. Ask your health care provider for more information. Weight and diet Eat a healthy diet  Be sure to include plenty of vegetables, fruits, low-fat dairy products, and lean protein.  Do not eat a lot of foods high in solid fats, added sugars, or salt.  Get regular exercise. This is one of  the most important things you can do for your health. ? Most adults should exercise for at least 150 minutes each week. The exercise should increase your heart rate and make you sweat (moderate-intensity exercise). ? Most adults should also do strengthening exercises at least twice a week. This is in addition to the moderate-intensity exercise.  Maintain a healthy weight  Body mass index (BMI) is a measurement that can be used to identify possible weight problems. It estimates body fat based on height and weight. Your health care provider can help determine your BMI and help you achieve or maintain a healthy weight.  For females 40 years of age and older: ? A BMI below 18.5 is considered underweight. ? A BMI of 18.5 to 24.9 is normal. ? A BMI of 25 to 29.9 is considered overweight. ? A BMI of 30 and above is considered obese.  Watch levels of cholesterol and blood lipids  You should start having your blood tested for lipids and cholesterol at 81 years of age, then have this test every 5 years.  You may need to have your cholesterol levels checked more often if: ? Your lipid or cholesterol levels are high. ? You are older than 81 years of age. ? You are at high risk for heart disease.  Cancer screening Lung Cancer  Lung cancer screening is recommended for adults 16-40 years old who are at high risk for lung cancer because of a history of smoking.  A yearly low-dose CT scan of the lungs is recommended for people who: ? Currently smoke. ? Have quit within the past 15 years. ? Have at least a 30-pack-year history of smoking. A pack year is smoking an average of one pack of cigarettes a day for 1 year.  Yearly screening should continue until it has been 15 years since you quit.  Yearly  screening should stop if you develop a health problem that would prevent you from having lung cancer treatment.  Breast Cancer  Practice breast self-awareness. This means understanding how your  breasts normally appear and feel.  It also means doing regular breast self-exams. Let your health care provider know about any changes, no matter how small.  If you are in your 20s or 30s, you should have a clinical breast exam (CBE) by a health care provider every 1-3 years as part of a regular health exam.  If you are 60 or older, have a CBE every year. Also consider having a breast X-ray (mammogram) every year.  If you have a family history of breast cancer, talk to your health care provider about genetic screening.  If you are at high risk for breast cancer, talk to your health care provider about having an MRI and a mammogram every year.  Breast cancer gene (BRCA) assessment is recommended for women who have family members with BRCA-related cancers. BRCA-related cancers include: ? Breast. ? Ovarian. ? Tubal. ? Peritoneal cancers.  Results of the assessment will determine the need for genetic counseling and BRCA1 and BRCA2 testing.  Cervical Cancer Your health care provider may recommend that you be screened regularly for cancer of the pelvic organs (ovaries, uterus, and vagina). This screening involves a pelvic examination, including checking for microscopic changes to the surface of your cervix (Pap test). You may be encouraged to have this screening done every 3 years, beginning at age 64.  For women ages 3-65, health care providers may recommend pelvic exams and Pap testing every 3 years, or they may recommend the Pap and pelvic exam, combined with testing for human papilloma virus (HPV), every 5 years. Some types of HPV increase your risk of cervical cancer. Testing for HPV may also be done on women of any age with unclear Pap test results.  Other health care providers may not recommend any screening for nonpregnant women who are considered low risk for pelvic cancer and who do not have symptoms. Ask your health care provider if a screening pelvic exam is right for you.  If you  have had past treatment for cervical cancer or a condition that could lead to cancer, you need Pap tests and screening for cancer for at least 20 years after your treatment. If Pap tests have been discontinued, your risk factors (such as having a new sexual partner) need to be reassessed to determine if screening should resume. Some women have medical problems that increase the chance of getting cervical cancer. In these cases, your health care provider may recommend more frequent screening and Pap tests.  Colorectal Cancer  This type of cancer can be detected and often prevented.  Routine colorectal cancer screening usually begins at 81 years of age and continues through 81 years of age.  Your health care provider may recommend screening at an earlier age if you have risk factors for colon cancer.  Your health care provider may also recommend using home test kits to check for hidden blood in the stool.  A small camera at the end of a tube can be used to examine your colon directly (sigmoidoscopy or colonoscopy). This is done to check for the earliest forms of colorectal cancer.  Routine screening usually begins at age 39.  Direct examination of the colon should be repeated every 5-10 years through 81 years of age. However, you may need to be screened more often if early forms of precancerous polyps  or small growths are found.  Skin Cancer  Check your skin from head to toe regularly.  Tell your health care provider about any new moles or changes in moles, especially if there is a change in a mole's shape or color.  Also tell your health care provider if you have a mole that is larger than the size of a pencil eraser.  Always use sunscreen. Apply sunscreen liberally and repeatedly throughout the day.  Protect yourself by wearing long sleeves, pants, a wide-brimmed hat, and sunglasses whenever you are outside.  Heart disease, diabetes, and high blood pressure  High blood pressure causes  heart disease and increases the risk of stroke. High blood pressure is more likely to develop in: ? People who have blood pressure in the high end of the normal range (130-139/85-89 mm Hg). ? People who are overweight or obese. ? People who are African American.  If you are 50-32 years of age, have your blood pressure checked every 3-5 years. If you are 30 years of age or older, have your blood pressure checked every year. You should have your blood pressure measured twice-once when you are at a hospital or clinic, and once when you are not at a hospital or clinic. Record the average of the two measurements. To check your blood pressure when you are not at a hospital or clinic, you can use: ? An automated blood pressure machine at a pharmacy. ? A home blood pressure monitor.  If you are between 38 years and 89 years old, ask your health care provider if you should take aspirin to prevent strokes.  Have regular diabetes screenings. This involves taking a blood sample to check your fasting blood sugar level. ? If you are at a normal weight and have a low risk for diabetes, have this test once every three years after 81 years of age. ? If you are overweight and have a high risk for diabetes, consider being tested at a younger age or more often. Preventing infection Hepatitis B  If you have a higher risk for hepatitis B, you should be screened for this virus. You are considered at high risk for hepatitis B if: ? You were born in a country where hepatitis B is common. Ask your health care provider which countries are considered high risk. ? Your parents were born in a high-risk country, and you have not been immunized against hepatitis B (hepatitis B vaccine). ? You have HIV or AIDS. ? You use needles to inject street drugs. ? You live with someone who has hepatitis B. ? You have had sex with someone who has hepatitis B. ? You get hemodialysis treatment. ? You take certain medicines for  conditions, including cancer, organ transplantation, and autoimmune conditions.  Hepatitis C  Blood testing is recommended for: ? Everyone born from [birth year] through 1963. ? Anyone with known risk factors for hepatitis C.  Sexually transmitted infections (STIs)  You should be screened for sexually transmitted infections (STIs) including gonorrhea and chlamydia if: ? You are sexually active and are younger than 81 years of age. ? You are older than 81 years of age and your health care provider tells you that you are at risk for this type of infection. ? Your sexual activity has changed since you were last screened and you are at an increased risk for chlamydia or gonorrhea. Ask your health care provider if you are at risk.  If you do not have HIV, but are at risk,  it may be recommended that you take a prescription medicine daily to prevent HIV infection. This is called pre-exposure prophylaxis (PrEP). You are considered at risk if: ? You are sexually active and do not regularly use condoms or know the HIV status of your partner(s). ? You take drugs by injection. ? You are sexually active with a partner who has HIV.  Talk with your health care provider about whether you are at high risk of being infected with HIV. If you choose to begin PrEP, you should first be tested for HIV. You should then be tested every 3 months for as long as you are taking PrEP. Pregnancy  If you are premenopausal and you may become pregnant, ask your health care provider about preconception counseling.  If you may become pregnant, take 400 to 800 micrograms (mcg) of folic acid every day.  If you want to prevent pregnancy, talk to your health care provider about birth control (contraception). Osteoporosis and menopause  Osteoporosis is a disease in which the bones lose minerals and strength with aging. This can result in serious bone fractures. Your risk for osteoporosis can be identified using a bone density  scan.  If you are 27 years of age or older, or if you are at risk for osteoporosis and fractures, ask your health care provider if you should be screened.  Ask your health care provider whether you should take a calcium or vitamin D supplement to lower your risk for osteoporosis.  Menopause may have certain physical symptoms and risks.  Hormone replacement therapy may reduce some of these symptoms and risks. Talk to your health care provider about whether hormone replacement therapy is right for you. Follow these instructions at home:  Schedule regular health, dental, and eye exams.  Stay current with your immunizations.  Do not use any tobacco products including cigarettes, chewing tobacco, or electronic cigarettes.  If you are pregnant, do not drink alcohol.  If you are breastfeeding, limit how much and how often you drink alcohol.  Limit alcohol intake to no more than 1 drink per day for nonpregnant women. One drink equals 12 ounces of beer, 5 ounces of wine, or 1 ounces of hard liquor.  Do not use street drugs.  Do not share needles.  Ask your health care provider for help if you need support or information about quitting drugs.  Tell your health care provider if you often feel depressed.  Tell your health care provider if you have ever been abused or do not feel safe at home. This information is not intended to replace advice given to you by your health care provider. Make sure you discuss any questions you have with your health care provider. Document Released: 11/10/2010 Document Revised: 10/03/2015 Document Reviewed: 01/29/2015 Elsevier Interactive Patient Education  [employer].

## 2017-03-26 NOTE — Progress Notes (Signed)
KIM, HANNAH R., DO  

## 2017-06-15 ENCOUNTER — Encounter: Payer: Self-pay | Admitting: Family Medicine

## 2017-06-22 DIAGNOSIS — C4490 Unspecified malignant neoplasm of skin, unspecified: Secondary | ICD-10-CM

## 2017-06-22 HISTORY — DX: Unspecified malignant neoplasm of skin, unspecified: C44.90

## 2017-08-23 ENCOUNTER — Telehealth: Payer: Self-pay | Admitting: Family Medicine

## 2017-08-23 NOTE — Telephone Encounter (Unsigned)
Copied from [place] [id number]. Topic: Quick Communication - See Telephone Encounter >> Aug 23, 2017  3:13 PM Percell Belt A wrote: CRM for notification. See Telephone encounter for: 08/23/17. Pt called in and stated that her eye dr thinks she has vertigo.  She was wanting to come in and see Dr Maudie Mercury but she did not have any opening this weeks.  She would either like something called in or worked in?    Best number [phone]

## 2017-08-24 ENCOUNTER — Ambulatory Visit: Payer: Medicare Other | Admitting: Family Medicine

## 2017-08-24 ENCOUNTER — Encounter: Payer: Self-pay | Admitting: Family Medicine

## 2017-08-24 VITALS — BP 112/62 | HR 72 | Temp 97.4°F | Wt 118.0 lb

## 2017-08-24 DIAGNOSIS — J3089 Other allergic rhinitis: Secondary | ICD-10-CM | POA: Diagnosis not present

## 2017-08-24 NOTE — Telephone Encounter (Signed)
We have opening today and Thursday. Please put her in available slot. Not sure why she was told we dont have openings? Also we have other providers if none of those slots work for her. Thanks.

## 2017-08-24 NOTE — Patient Instructions (Signed)
Claritin 10 mg plain.......[name] 1 daily in the morning  Steroid nasal spray...Marland KitchenMarland KitchenMarland Kitchen 1 shot up each nostril twice daily

## 2017-08-24 NOTE — Telephone Encounter (Signed)
I left a detailed message with the information bellow and asked that the pt call back for an appt.

## 2017-08-24 NOTE — Progress Notes (Signed)
Heidi Moore is a 84 year old female of Dr. Maudie Mercury who comes in today for evaluation of allergic rhinitis  She has a history of spring allergic rhinitis. She tried the [employer] brand of steroid nasal spray but was concerned she might have side effects therefore called [facility]???????????????. She had no nosebleed etc. Her symptoms are head congestion runny nose. No history of asthma.  Difficult to get a coherent history because she rambles  BP 112/62 (BP Location: Right Arm, Patient Position: Sitting, Cuff Size: Normal)   Pulse 72   Temp (!) 97.4 F (36.3 C) (Oral)   Wt 118 lb (53.5 kg)   BMI 22.30 kg/m  Well-developed well-nourished female no acute distress vital signs stable she's afebrile HEENT were negative except for marked bilateral nasal edema consistent with allergic rhinitis  #1 allergic rhinitis........... Plain Claritin the morning....... Steroid nasal spray one shot up each nostril twice a day

## 2017-08-25 ENCOUNTER — Telehealth: Payer: Self-pay

## 2017-08-25 NOTE — Telephone Encounter (Signed)
Dr. Sherren Mocha seen Ms Ival Bible yesterday for sinus issues. Felt she may be at risk for driving.   Fup call placed to Terri her dtr and per the dtr, she rides with her and feels she is doing well. NO issues of concern except c/o of dizziness.  Will fup with Dr. Maudie Mercury as needed Apt is scheduled for 5/28 and Terri felt that was soon enough.   Dr. Maudie Mercury, [place]

## 2017-08-25 NOTE — Telephone Encounter (Signed)
Call back to Ms. Niznik Had to leave a message   Told her to call back tomorrow if any issues and relay to Dr. Maudie Mercury., as  I will be out of the office at [facility]

## 2017-08-25 NOTE — Telephone Encounter (Signed)
The patient in to see Dr todd yesterday. Dr Sherren Mocha recommended outreach to the dtr;   Left VM for Terri to call me at [phone]  Brandt Loosen (Daughter)  Showing 2 of 2     [phone]         Wynetta Fines RN

## 2017-08-25 NOTE — Telephone Encounter (Signed)
Pt called back to speak w/ Manuela Schwartz, call pt to advise

## 2017-10-04 NOTE — Progress Notes (Signed)
HPI:  Using dictation device. Unfortunately this device frequently misinterprets words/phrases.  Here for CPE: AWV with Manuela Schwartz was 03/25/17  -Concerns and/or follow up today:   Reports has had a rough time since feb. Had root canal in February, on abx x 14 days for this. Finally got over this ordeal. Seeing Dr. Kathlen Mody with opthalmology for glaucoma - on drops. Reports eyes doing better now. Saw dermatologist for a bump on her leg that turned out to be skin cancer - had to be on another antibiotic.  Reports intermittent vertigo for several months. Had a spell of this several years ago and last year. Feels like happens more when standing and turning to the R, but not when sitting or lying. No headaches, fevers, malaise, wt loss, palpitations, cp, sob, nausea. She saw [facility] recently for this, did not think was sinus related.  -Diet: variety of foods, balance and well rounded, larger portion sizes -Exercise: no regular exercise -Taking folic acid, vitamin D or calcium: no -Diabetes and Dyslipidemia Screening:  -Vaccines: see vaccine section EPIC -pap history: n/s - sees gyn, Dr. Gertie Fey  -FDLMP: see nursing notes -sexual activity: yes, female partner, no new partners -wants STI testing (Hep C if born 36-65): no -FH breast, colon or ovarian ca: see FH Last mammogram: n/a has chosen Research scientist (physical sciences) to do 2ndary to age Last colon cancer screening: n/a has chosen not to do 2ndary to age Breast Ca Risk Assessment: see family history and pt history DEXA (>/= 27): declined  -Alcohol, Tobacco, drug use: see social history  Review of Systems - no fevers, unintentional weight loss, vision loss, hearing loss, chest pain, sob, hemoptysis, melena, hematochezia, hematuria, genital discharge, changing or concerning skin lesions, bleeding, bruising, loc, thoughts of self harm or SI  Past Medical History:  Diagnosis Date  . AC (acromioclavicular) joint bone spurs    on right shoulder   . Allergic rhinitis   .  Arthritis   . Bursitis    right shoulder  . Complication of anesthesia    headaches  . Diverticulosis   . DIVERTICULOSIS, COLON 10/15/2006   Qualifier: Diagnosis of  By: Linna Darner MD, Gwyndolyn Saxon Eye problem    treated by Dr Kathlen Mody per patient  . Headache(784.0)    sinus HA  . History of blood transfusion 62yrs ago   no abnormal reaction noted  . History of cystitis   . History of skin cancer    Basal cell, Dr. Lindwood Coke  . Hyperlipidemia    [place] Study LDL goal =<160   . Hypotension   . Joint pain   . Joint swelling   . Macular degeneration, dry   . Osteopenia    Intolerance to Calcium,Actonel,Fosamax,Evista, Forteo: S/P Boniva 2007 to 03/2010  . OSTEOPENIA 03/12/2008   Qualifier: Diagnosis of  By: Velora Heckler    . Osteoporosis   . PONV (postoperative nausea and vomiting)   . Rosacea 04/22/2009   Qualifier: Diagnosis of  By: Linna Darner MD, Gwyndolyn Saxon    . Skin cancer 06/22/2017   per patient after excision right calf by Dr Ubaldo Glassing  . SKIN CANCER, HX OF 03/12/2008   Qualifier: Diagnosis of  By: Velora Heckler    . Urinary frequency   . Vaginal atrophy    sees Dr. Gertie Fey    Past Surgical History:  Procedure Laterality Date  . APPENDECTOMY    . BUNIONECTOMY Bilateral   . CATARACT EXTRACTION, BILATERAL    . COLONOSCOPY  with Tics (no further f/u as per GI due to age)   . KNEE ARTHROPLASTY  05/18/2012   Procedure: COMPUTER ASSISTED TOTAL KNEE ARTHROPLASTY;  Surgeon: Marybelle Killings, MD;  Location: [facility];  Service: Orthopedics;  Laterality: Left;  Left Total Knee Arthroplasty, Cemented, Computer Assisted  . lt knee      meniscus tear  . MOHS SURGERY     right nostril for basel cell cancer   . NOSE SURGERY     due to trama at age 58   . TOTAL ABDOMINAL HYSTERECTOMY W/ BILATERAL SALPINGOOPHORECTOMY     for Endometriosis   . TOTAL SHOULDER ARTHROPLASTY Right 09/11/2013   Procedure: TOTAL SHOULDER ARTHROPLASTY;  Surgeon: Marybelle Killings, MD;  Location: [facility];  Service:  Orthopedics;  Laterality: Right;  Right Total Shoulder Arthroplasty    Family History  Problem Relation Age of Onset  . Colon cancer Other        1st Cousin   . Stroke Mother 40  . Diabetes Brother   . Atrial fibrillation Brother        coumadin  . Lung cancer Sister     Social History   Socioeconomic History  . Marital status: Widowed    Spouse name: Not on file  . Number of children: Not on file  . Years of education: Not on file  . Highest education level: Not on file  Occupational History  . Not on file  Social Needs  . Financial resource strain: Not on file  . Food insecurity:    Worry: Not on file    Inability: Not on file  . Transportation needs:    Medical: Not on file    Non-medical: Not on file  Tobacco Use  . Smoking status: Never Smoker  . Smokeless tobacco: Never Used  Substance and Sexual Activity  . Alcohol use: No  . Drug use: No  . Sexual activity: Not on file  Lifestyle  . Physical activity:    Days per week: Not on file    Minutes per session: Not on file  . Stress: Not on file  Relationships  . Social connections:    Talks on phone: Not on file    Gets together: Not on file    Attends religious service: Not on file    Active member of club or organization: Not on file    Attends meetings of clubs or organizations: Not on file    Relationship status: Not on file  Other Topics Concern  . 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  B Complex-C (B-COMPLEX WITH VITAMIN C)  tablet, Take 1 tablet by mouth daily., Disp: , Rfl:  .  cholecalciferol (VITAMIN D) 1000 UNITS tablet, Take 1,000 Units by mouth daily. , Disp: , Rfl:  .  FLUOCINOLONE ACETONIDE OT, Place in ear(s)., Disp: , Rfl:  .  OVER THE COUNTER  MEDICATION, Take 1 capsule by mouth 2 (two) times daily. [place]7 daily., Disp: , Rfl:  .  OVER THE COUNTER MEDICATION, 2 (two) times daily. Preservision vitamin for eyes, Disp: , Rfl:  .  Polyethyl Glycol-Propyl Glycol (SYSTANE OP), Apply to eye daily., Disp: , Rfl:  .  Travoprost, BAK Free, (TRAVATAN) 0.004 % SOLN ophthalmic solution, 1 drop at bedtime., Disp: , Rfl:   EXAM:  Vitals:   10/05/17 1057  BP: 118/64  Pulse: 67  Temp: (!) 97.4 F (36.3 C)    GENERAL: vitals reviewed and listed below, alert, oriented, appears well hydrated and in no acute distress  HEENT: head atraumatic, PERRLA, normal appearance of eyes, ears, nose and mouth. moist mucus membranes.  NECK: supple, no masses or lymphadenopathy  LUNGS: clear to auscultation bilaterally, no rales, rhonchi or wheeze  CV: HRRR, no peripheral edema or cyanosis, normal pedal pulses  ABDOMEN: bowel sounds normal, soft, non tender to palpation, no masses, no rebound or guarding  GU/BREAST: declined  SKIN: no rash or abnormal lesions  MS: normal gait, moves all extremities normally  NEURO: normal gait, speech and thought processing grossly intact, muscle tone grossly intact throughout, CN II-XII grossly intact, finger to nose normal, dix hallpike neg  PSYCH: normal affect, pleasant and cooperative  ASSESSMENT AND PLAN:  Discussed the following assessment and plan:  PREVENTIVE EXAM: -Discussed and advised all [place] preventive services health task force level A and B recommendations for age, sex and risks. -Advised at least 150 minutes of exercise per week and a healthy diet with avoidance of (less then 1 serving per week) processed foods, white starches, red meat, fast foods  and sweets and consisting of: * 5-9 servings of fresh fruits and vegetables (not corn or potatoes) *nuts and seeds, beans *olives and olive oil *lean meats such as fish and white chicken  *whole grains -labs, studies and vaccines per orders this encounter - Basic metabolic panel - CBC  2. Screening for depression -see phq9  3. Vertigo -we discussed possible serious and likely etiologies, workup and treatment, treatment risks and return precautions -after this discussion, [place] opted for referral to neurology, lab check, offered PT(vestibular rehab but opted to hold off) -follow up advised 3-4 months -of course, we advised [place]  to return or notify a doctor immediately if symptoms worsen or persist or new concerns arise. - Ambulatory referral to Neurology   Patient advised to return to clinic immediately if symptoms worsen or persist or new concerns.  Patient Instructions  BEFORE YOU LEAVE: -labs -follow up: 3-4 months  Do not drive with vertigo.  We have ordered labs or studies at this visit. It can take up to 1-2 weeks for results and processing. IF results require follow up or explanation, we will call you with instructions. Clinically stable results will be released to your [facility]. If you have not heard from [place] or cannot find your results in [facility] in 2 weeks please contact our office at [phone].  If you are not yet signed up for [facility], please consider signing up.  -We placed a referral for you as discussed to the neurologist. It usually takes about 1-2 weeks to process and schedule this referral. If you have not heard from [place] regarding this appointment in 2 weeks please contact our office.  Eat a healthy diet and get regular exercise.        No follow-ups on file.  Lucretia Kern, DO

## 2017-10-05 ENCOUNTER — Encounter: Payer: Self-pay | Admitting: Neurology

## 2017-10-05 ENCOUNTER — Encounter: Payer: Self-pay | Admitting: Family Medicine

## 2017-10-05 ENCOUNTER — Ambulatory Visit (INDEPENDENT_AMBULATORY_CARE_PROVIDER_SITE_OTHER): Payer: Medicare Other | Admitting: Family Medicine

## 2017-10-05 VITALS — BP 118/64 | HR 67 | Temp 97.4°F | Ht 61.0 in | Wt 116.5 lb

## 2017-10-05 DIAGNOSIS — Z0000 Encounter for general adult medical examination without abnormal findings: Secondary | ICD-10-CM | POA: Diagnosis not present

## 2017-10-05 DIAGNOSIS — R42 Dizziness and giddiness: Secondary | ICD-10-CM

## 2017-10-05 DIAGNOSIS — Z1331 Encounter for screening for depression: Secondary | ICD-10-CM | POA: Diagnosis not present

## 2017-10-05 LAB — BASIC METABOLIC PANEL
BUN: 14 mg/dL (ref 6–23)
CO2: 27 mEq/L (ref 19–32)
CREATININE: 0.86 mg/dL (ref 0.40–1.20)
Calcium: 9.8 mg/dL (ref 8.4–10.5)
Chloride: 107 mEq/L (ref 96–112)
GFR: 65.11 mL/min (ref >60.00)
Glucose, Bld: 92 mg/dL (ref 70–99)
Potassium: 3.8 mEq/L (ref 3.5–5.1)
Sodium: 141 mEq/L (ref 135–145)

## 2017-10-05 LAB — CBC
HEMATOCRIT: 40.3 % (ref 36.0–46.0)
Hemoglobin: 13.6 g/dL (ref 12.0–15.0)
MCHC: 33.7 g/dL (ref 30.0–36.0)
MCV: 90.8 fl (ref 78.0–100.0)
Platelets: 176 10*3/uL (ref 150.0–400.0)
RBC: 4.44 Mil/uL (ref 3.87–5.11)
RDW: 13.7 % (ref 11.5–15.5)
WBC: 5.4 10*3/uL (ref 4.0–10.5)

## 2017-10-05 NOTE — Patient Instructions (Addendum)
BEFORE YOU LEAVE: -labs -follow up: 3-4 months  Do not drive with vertigo.  We have ordered labs or studies at this visit. It can take up to 1-2 weeks for results and processing. IF results require follow up or explanation, we will call you with instructions. Clinically stable results will be released to your [facility]. If you have not heard from [place] or cannot find your results in [facility] in 2 weeks please contact our office at [phone].  If you are not yet signed up for [facility], please consider signing up.  -We placed a referral for you as discussed to the neurologist. It usually takes about 1-2 weeks to process and schedule this referral. If you have not heard from [place] regarding this appointment in 2 weeks please contact our office.  Eat a healthy diet and get regular exercise.

## 2017-10-06 ENCOUNTER — Encounter: Payer: Self-pay | Admitting: Adult Health

## 2017-10-06 ENCOUNTER — Ambulatory Visit: Payer: Self-pay | Admitting: *Deleted

## 2017-10-06 ENCOUNTER — Telehealth: Payer: Self-pay | Admitting: Family Medicine

## 2017-10-06 ENCOUNTER — Ambulatory Visit: Payer: Medicare Other | Admitting: Adult Health

## 2017-10-06 VITALS — BP 126/70 | Temp 98.4°F | Wt 116.0 lb

## 2017-10-06 DIAGNOSIS — R42 Dizziness and giddiness: Secondary | ICD-10-CM

## 2017-10-06 NOTE — Progress Notes (Signed)
Subjective:    Patient ID: Heidi Moore, female    DOB: 03-Jan-1920, 84 y.o.   MRN: [id number]  HPI  84 year old female who  has a past medical history of AC (acromioclavicular) joint bone spurs, Allergic rhinitis, Arthritis, Bursitis, Complication of anesthesia, Diverticulosis, DIVERTICULOSIS, COLON (10/15/2006), Eye problem, Headache(784.0), History of blood transfusion (30yrs ago), History of cystitis, History of skin cancer, Hyperlipidemia, Hypotension, Joint pain, Joint swelling, Macular degeneration, dry, Osteopenia, OSTEOPENIA (03/12/2008), Osteoporosis, PONV (postoperative nausea and vomiting), Rosacea (04/22/2009), Skin cancer (06/22/2017), SKIN CANCER, HX OF (03/12/2008), Urinary frequency, and Vaginal atrophy.  She is a patient of Dr. Maudie Mercury who I am seeing today for a chronic issue of dizziness. This his a long standing issue and was actually seen by her PCP yesterday who referred her to Neurology but her appointment is not till early July. She reports intermittent vertigo for several months. She has been seen by [facility] for this and they did not think it was sinus related.  Had a recent CT of paranasal sinus which showed:    IMPRESSION: No active sinusitis or sinus outflow obstruction.  Her PCP offered PT ( vestibular rehab) yesterday but patient opted to hold of on this. Today she would like to proceed with this to see if it makes a difference   Review of Systems See HPI   Past Medical History:  Diagnosis Date  . AC (acromioclavicular) joint bone spurs    on right shoulder   . Allergic rhinitis   . Arthritis   . Bursitis    right shoulder  . Complication of anesthesia    headaches  . Diverticulosis   . DIVERTICULOSIS, COLON 10/15/2006   Qualifier: Diagnosis of  By: Linna Darner MD, Gwyndolyn Saxon Eye problem    treated by Dr Kathlen Mody per patient  . Headache(784.0)    sinus HA  . History of blood transfusion 79yrs ago   no abnormal reaction noted  . History of cystitis   .  History of skin cancer    Basal cell, Dr. Lindwood Coke  . Hyperlipidemia    [place] Study LDL goal =<160   . Hypotension   . Joint pain   . Joint swelling   . Macular degeneration, dry   . Osteopenia    Intolerance to Calcium,Actonel,Fosamax,Evista, Forteo: S/P Boniva 2007 to 03/2010  . OSTEOPENIA 03/12/2008   Qualifier: Diagnosis of  By: Velora Heckler    . Osteoporosis   . PONV (postoperative nausea and vomiting)   . Rosacea 04/22/2009   Qualifier: Diagnosis of  By: Linna Darner MD, Gwyndolyn Saxon    . Skin cancer 06/22/2017   per patient after excision right calf by Dr Ubaldo Glassing  . SKIN CANCER, HX OF 03/12/2008   Qualifier: Diagnosis of  By: Velora Heckler    . Urinary frequency   . Vaginal atrophy    sees Dr. Gertie Fey    Social History   Socioeconomic History  . Marital status: Widowed    Spouse name: Not on file  . Number of children: Not on file  . Years of education: Not on file  . Highest education level: Not on file  Occupational History  . Not on file  Social Needs  . Financial resource strain: Not on file  . Food insecurity:    Worry: Not on file    Inability: Not on file  . Transportation needs:    Medical: Not on file    Non-medical: Not on file  Tobacco  Use  . Smoking status: Never Smoker  . Smokeless tobacco: Never Used  Substance and Sexual Activity  . Alcohol use: No  . Drug use: No  . Sexual activity: Not on file  Lifestyle  . Physical activity:    Days per week: Not on file    Minutes per session: Not on file  . Stress: Not on file  Relationships  . Social connections:    Talks on phone: Not on file    Gets together: Not on file    Attends religious service: Not on file    Active member of club or organization: Not on file    Attends meetings of clubs or organizations: Not on file    Relationship status: Not on file  . Intimate partner violence:    Fear of current or ex partner: Not on file    Emotionally abused: Not on file    Physically abused:  Not on file    Forced sexual activity: Not on file  Other Topics Concern  . 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 APPENDECTOMY    . BUNIONECTOMY Bilateral   . CATARACT EXTRACTION, BILATERAL    . COLONOSCOPY     with Tics (no further f/u as per GI due to age)   . KNEE ARTHROPLASTY  05/18/2012   Procedure: COMPUTER ASSISTED TOTAL KNEE ARTHROPLASTY;  Surgeon: Marybelle Killings, MD;  Location: [place];  Service: Orthopedics;  Laterality: Left;  Left Total Knee Arthroplasty, Cemented, Computer Assisted  . lt knee      meniscus tear  . MOHS SURGERY     right nostril for basel cell cancer   . NOSE SURGERY     due to trama at age 89   . TOTAL ABDOMINAL HYSTERECTOMY W/ BILATERAL SALPINGOOPHORECTOMY     for Endometriosis   . TOTAL SHOULDER ARTHROPLASTY Right 09/11/2013   Procedure: TOTAL SHOULDER ARTHROPLASTY;  Surgeon: Marybelle Killings, MD;  Location: [place];  Service: Orthopedics;  Laterality: Right;  Right Total Shoulder Arthroplasty    Family History  Problem Relation Age of Onset  . Colon cancer Other        1st Cousin   . Stroke Mother 11  . Diabetes Brother   . Atrial fibrillation Brother        coumadin  . Lung cancer Sister     Allergies  Allergen Reactions  . Alendronate Sodium Nausea And Vomiting  . Anaplex Hd Nausea And Vomiting  . Aspirin Nausea And Vomiting  . Azithromycin Nausea And Vomiting  . Calcium Nausea And Vomiting  . Codeine Nausea And Vomiting  . Doxycycline Nausea And Vomiting  .  Hydrocodone Nausea And Vomiting  . Ibandronate Sodium     REACTION: severe body aches  . Ibuprofen Nausea And Vomiting  . Penicillins Nausea And Vomiting  . Raloxifene Nausea And Vomiting  . Risedronate  Sodium Nausea And Vomiting  . Sulfonamide Derivatives Nausea And Vomiting  . Teriparatide Nausea And Vomiting    Current Outpatient Medications on File Prior to Visit  Medication Sig Dispense Refill  . B Complex-C (B-COMPLEX WITH VITAMIN C) tablet Take 1 tablet by mouth daily.    . cholecalciferol (VITAMIN D) 1000 UNITS tablet Take 1,000 Units by mouth daily.     [name] FLUOCINOLONE ACETONIDE OT Place in ear(s).    [name] OVER THE COUNTER MEDICATION Take 1 capsule by mouth 2 (two) times daily. [place]7 daily.    [name] OVER THE COUNTER MEDICATION 2 (two) times daily. Preservision vitamin for eyes    . Polyethyl Glycol-Propyl Glycol (SYSTANE OP) Apply to eye daily.    . Travoprost, BAK Free, (TRAVATAN) 0.004 % SOLN ophthalmic solution 1 drop at bedtime.     No current facility-administered medications on file prior to visit.     BP 126/70   Temp 98.4 F (36.9 C) (Oral)   Wt 116 lb (52.6 kg)   BMI 21.92 kg/m       Objective:   Physical Exam  Constitutional: She is oriented to person, place, and time. She appears well-developed and well-nourished. No distress.  HENT:  Right Ear: Hearing, tympanic membrane, external ear and ear canal normal.  Left Ear: Hearing, tympanic membrane, external ear and ear canal normal.  Eyes: Pupils are equal, round, and reactive to light. Conjunctivae and EOM are normal. Right eye exhibits no discharge. Left eye exhibits no discharge.  Cardiovascular: Normal rate, regular rhythm, normal heart sounds and intact distal pulses. Exam reveals no gallop and no friction rub.  No murmur heard. Pulmonary/Chest: Effort normal and breath sounds normal. No stridor. No respiratory distress. She has no wheezes. She has no rales. She exhibits no tenderness.    Neurological: She is alert and oriented to person, place, and time.  Skin: Skin is warm and dry. Capillary refill takes less than 2 seconds. She is not diaphoretic.  Psychiatric: She has a normal mood and affect. Her behavior is normal. Judgment and thought content normal.  Nursing note and vitals reviewed.     Assessment & Plan:  1. Vertigo - Will speak to her PCP tomorrow when she returns to the office - Reassured patient   Dorothyann Peng, NP

## 2017-10-06 NOTE — Telephone Encounter (Signed)
Pt  Seen yesterday by Dr Maudie Mercury- Pt reports  Symptoms worse today told to return to clinic if worse. Same day appt made with Georgina Snell NafzigerPatient advised not to drive   Reason for Disposition . [1] MODERATE dizziness (e.g., vertigo; feels very unsteady, interferes with normal activities) AND [2] has been evaluated by physician for this  Answer Assessment - Initial Assessment Questions 1. DESCRIPTION: "Describe your dizziness."      Over The last hour has gotten stand upright worse when turns her  Eyes   2. VERTIGO: "Do you feel like either you or the room is spinning or tilting?"          No   3. LIGHTHEADED: "Do you feel lightheaded?" (e.g., somewhat faint, woozy, weak upon standing)          Off balance woozy when she stands or walks  4. SEVERITY: "How bad is it?"  "Can you walk?"   - MILD - Feels unsteady but walking normally.   - MODERATE - Feels very unsteady when walking, but not falling; interferes with normal activities (e.g., school, work) .   - SEVERE - Unable to walk without falling (requires assistance).        Moderate   5. ONSET:  "When did the dizziness begin?"          Off and on about 1  Month   6. AGGRAVATING FACTORS: "Does anything make it worse?" (e.g., standing, change in head position)     Standing  Changes  In head posistion  And  Eye movement  7. CAUSE: "What do you think is causing the dizziness?"        Seen yesterday by Dr Maudie Mercury  Symptoms worse today   8. RECURRENT SYMPTOM: "Have you had dizziness before?" If so, ask: "When was the last time?" "What happened that time?"     Nauseated   9. OTHER SYMPTOMS: "Do you have any other symptoms?" (e.g., headache, weakness, numbness, vomiting, earache)        Nauseated   10. PREGNANCY: "Is there any chance you are pregnant?" "When was your last menstrual period?"       no  Protocols used: DIZZINESS - VERTIGO-A-AH

## 2017-10-06 NOTE — Telephone Encounter (Signed)
Copied from [place] [id number]. Topic: Quick Communication - See Telephone Encounter >> Oct 06, 2017  9:03 AM Cleaster Corin, NT wrote: CRM for notification. See Telephone encounter for: 10/06/17.  Pt. Would like a callback from nurse to ask if its ok that her nero appt. Is a month away she knows that she needs to be seen sooner [phone]

## 2017-10-06 NOTE — Telephone Encounter (Signed)
This is actually really quick for neurology. Often take 3 months. She should ask to be placed on a wait list in case they have cancellation and can follow up here in the interim if any new concerns, worsening, etc.

## 2017-10-07 NOTE — Telephone Encounter (Signed)
I advise she KEEP the neurology appointment.  Ok to see PT in the interim. Recommend follow up with me in the interim if any worsening or other symptoms as may need MRI.

## 2017-10-07 NOTE — Telephone Encounter (Signed)
I called the pt and informed her of the message below.  She is aware the referral was placed for PT.

## 2017-10-07 NOTE — Telephone Encounter (Signed)
I called the pt and informed her of the message below.  Patient stated she wanted to let Dr Maudie Mercury know she does not want to see a neurologist as she only feels she needs therapy exercises for vertigo.  Message sent to Dr Maudie Mercury.

## 2017-10-11 NOTE — Telephone Encounter (Signed)
Patient calling and is requesting a call back with the PT referral name and number. States that they have not contacted her yet and she would like to get this started. Please advise. CB#: [phone]

## 2017-10-12 NOTE — Telephone Encounter (Signed)
I called the pt and informed her the referral was just placed a few days ago and it can take some time to get in.  She was given the phone number to call [phone] for an appt for PT.

## 2017-10-27 ENCOUNTER — Ambulatory Visit: Payer: Medicare Other | Attending: Family Medicine | Admitting: Rehabilitative and Restorative Service Providers"

## 2017-10-27 DIAGNOSIS — R42 Dizziness and giddiness: Secondary | ICD-10-CM | POA: Diagnosis present

## 2017-10-27 DIAGNOSIS — R2681 Unsteadiness on feet: Secondary | ICD-10-CM | POA: Insufficient documentation

## 2017-10-27 DIAGNOSIS — R2689 Other abnormalities of gait and mobility: Secondary | ICD-10-CM | POA: Diagnosis present

## 2017-10-27 NOTE — Patient Instructions (Signed)
Gaze Stabilization: Sitting    Keeping eyes on target on wall 3-4 feet away, and move head side to side for _30_ seconds. Repeat while moving head up and down for __30_ seconds. Do __2-3__ sessions per day.  Copyright  VHI. All rights reserved.   Gaze Stabilization: Tip Card  1.Target must remain in focus, not blurry, and appear stationary while head is in motion. 2.Perform exercises with small head movements (45 to either side of midline). 3.Increase speed of head motion so long as target is in focus. 4.If you wear eyeglasses, be sure you can see target through lens (therapist will give specific instructions for bifocal / progressive lenses). 5.These exercises may provoke dizziness or nausea. Work through these symptoms. If too dizzy, slow head movement slightly. Rest between each exercise. 6.Exercises demand concentration; avoid distractions.  Copyright  VHI. All rights reserved.

## 2017-10-28 ENCOUNTER — Encounter: Payer: Self-pay | Admitting: Rehabilitative and Restorative Service Providers"

## 2017-10-28 NOTE — Therapy (Signed)
[facility] [address] [place], [place], [address] Phone: [phone]   Fax:  [phone]  Physical Therapy Evaluation  Patient Details  Name: Heidi Moore MRN: [id number] Date of Birth: [birth year] Referring Provider: Colin Benton, MD   Encounter Date: 10/27/2017  PT End of Session - 10/27/17 1145    Visit Number  1    Number of Visits  5 eval + 4 weeks    Date for PT Re-Evaluation  12/12/17    Authorization Type  [employer] medicare    PT Start Time  1240    PT Stop Time  1320    PT Time Calculation (min)  40 min    Activity Tolerance  Patient tolerated treatment well    Behavior During Therapy  [facility] for tasks assessed/performed       Past Medical History:  Diagnosis Date  . AC (acromioclavicular) joint bone spurs    on right shoulder   . Allergic rhinitis   . Arthritis   . Bursitis    right shoulder  . Complication of anesthesia    headaches  . Diverticulosis   . DIVERTICULOSIS, COLON 10/15/2006   Qualifier: Diagnosis of  By: Linna Darner MD, Gwyndolyn Saxon Eye problem    treated by Dr Kathlen Mody per patient  . Headache(784.0)    sinus HA  . History of blood transfusion 37yrs ago   no abnormal reaction noted  . History of cystitis   . History of skin cancer    Basal cell, Dr. Lindwood Coke  . Hyperlipidemia    [place] Study LDL goal =<160   . Hypotension   . Joint pain   . Joint swelling   . Macular degeneration, dry   . Osteopenia    Intolerance to Calcium,Actonel,Fosamax,Evista, Forteo: S/P Boniva 2007 to 03/2010  . OSTEOPENIA 03/12/2008   Qualifier: Diagnosis of  By: Velora Heckler    . Osteoporosis   . PONV (postoperative nausea and vomiting)   . Rosacea 04/22/2009   Qualifier: Diagnosis of  By: Linna Darner MD, Gwyndolyn Saxon    . Skin cancer 06/22/2017   per patient after excision right calf by Dr Ubaldo Glassing  . SKIN CANCER, HX OF 03/12/2008   Qualifier: Diagnosis of  By: Velora Heckler    . Urinary frequency   .  Vaginal atrophy    sees Dr. Gertie Fey    Past Surgical History:  Procedure Laterality Date  . APPENDECTOMY    . BUNIONECTOMY Bilateral   . CATARACT EXTRACTION, BILATERAL    . COLONOSCOPY     with Tics (no further f/u as per GI due to age)   . KNEE ARTHROPLASTY  05/18/2012   Procedure: COMPUTER ASSISTED TOTAL KNEE ARTHROPLASTY;  Surgeon: Marybelle Killings, MD;  Location: [facility];  Service: Orthopedics;  Laterality: Left;  Left Total Knee Arthroplasty, Cemented, Computer Assisted  . lt knee      meniscus tear  . MOHS SURGERY     right nostril for basel cell cancer   . NOSE SURGERY     due to trama at age 68   . TOTAL ABDOMINAL HYSTERECTOMY W/ BILATERAL SALPINGOOPHORECTOMY     for Endometriosis   . TOTAL SHOULDER ARTHROPLASTY Right 09/11/2013   Procedure: TOTAL SHOULDER ARTHROPLASTY;  Surgeon: Marybelle Killings, MD;  Location: [facility];  Service: Orthopedics;  Laterality: Right;  Right Total Shoulder Arthroplasty    There were no vitals filed for this visit.   Subjective Assessment - 10/27/17 1244  Subjective  The patient notes she has had symptoms of dizziness since 06/15/2017.  She reports a h/o vertigo 40 years ago in which "the bed went one way and her eyes went the other."  She notes some intermittent episodes in the past with vertigo.   Recent onset of symptoms were associated with worsening allergies, vertigo and visual issues.  She got her eye pressure checked and has undergone procedures to reduce pressure.   She notes her right ear gets stopped up and "I can't quite focus."    Pertinent History  L TKR, vitamin D deficiency, low back pain, macular degeneration, h/o "ears stopping up" (with imbalance),  Has seen ENT and had CT scan within last 2 weeks (WNLs per patient),    Patient Stated Goals  "I hope you can help me with the dizziness."      Currently in Pain?  No/denies         [facility] PT Assessment - 10/27/17 1252      Assessment   Medical Diagnosis  vertigo    Referring Provider  Colin Benton, MD    Onset Date/Surgical Date  06/15/17    Prior Therapy  none      Precautions   Precautions  Fall      Restrictions   Weight Bearing Restrictions  No      Balance Screen   Has the patient fallen in the past 6 months  Yes    How many times?  1- sat down when outside blowing leaves and bumped her head (January 2019).    Has the patient had a decrease in activity level because of a fear of falling?   Yes    Is the patient reluctant to leave their home because of a fear of falling?   No      Home Environment   Living Environment  Private residence    Living Arrangements  Children    Type of [place] to enter    Entrance Stairs-Number of Steps  5    Entrance Stairs-Rails  Can reach both    [place]  One level    [place]  None      Prior Function   Level of Independence  Independent    Leisure  "I do everything.  I mow my yard, I plant plants, I exercise for my knee, I do silver sneakers."      Cognition   Overall Cognitive Status  Within Functional Limits for tasks assessed           Vestibular Assessment - 10/27/17 1255      Vestibular Assessment   General Observation  The patient notes blurred vision when walking, bending over.  "It's not so I can't see, it's just I feel off balance a little".   *more off balance when taking allergy pill (claritin).      Symptom Behavior   Type of Dizziness  Imbalance "feels funny, weird"    Frequency of Dizziness  only when ears are stopped up    Duration of Dizziness  Can last for a few hours, "usually I can get it open"    Aggravating Factors  -- When sinus pressure or ears clogged      Occulomotor Exam   Occulomotor Alignment  Normal    Spontaneous  Absent    Gaze-induced  Absent    Smooth Pursuits  Comment patient has difficulty with tracking with vertical movement  Saccades  Intact    Comment  Saw ENT last week.       Vestibulo-Occular Reflex   VOR 1 Head Only (x 1 viewing)   Head impulse test=positive for refixation saccade to the right side.      VOR to Slow Head Movement  -- unable to keep target in focus at slow speed      Positional Testing   Sidelying Test  Sidelying Right;Sidelying Left    Horizontal Canal Testing  Horizontal Canal Right;Horizontal Canal Left      Sidelying Right   Sidelying Right Duration  none    Sidelying Right Symptoms  No nystagmus      Sidelying Left   Sidelying Left Duration  none    Sidelying Left Symptoms  No nystagmus      Horizontal Canal Right   Horizontal Canal Right Duration  sensation of mild dizziness/ blurriness with sidelying on the right side    Horizontal Canal Right Symptoms  Normal used frenzel lenses to view, no nystagmus      Horizontal Canal Left   Horizontal Canal Left Duration  none    Horizontal Canal Left Symptoms  Normal          Objective measurements completed on examination: See above findings.       Vestibular Treatment/Exercise - 10/27/17 1312      Vestibular Treatment/Exercise   Vestibular Treatment Provided  Gaze    Habituation Exercises  --    Gaze Exercises  X1 Viewing Horizontal      X1 Viewing Horizontal   Foot Position  sitting    Comments  30 seconds slow pace with cueson correct technique            PT Education - 10/28/17 1142    Education Details  seated gaze x 1 adaptation    Person(s) Educated  Patient;Child(ren)    Methods  Explanation;Demonstration;Handout    Comprehension  Verbalized understanding;Returned demonstration          PT Long Term Goals - 10/28/17 1146      PT LONG TERM GOAL #1   Title  The patient will be indep with HEP for gaze adaptation, high level balance, and habituation as indicated    Time  4    Period  Weeks    Target Date  12/12/17      PT LONG TERM GOAL #2   Title  The patient will tolerate gaze x 1 viewing x 30 seconds without subjective increase in dizziness or reports of visual blurring.    Time  4    Period  Weeks     Target Date  12/12/17      PT LONG TERM GOAL #3   Title  The patient will be further assessed on Berg and LTG to follow/ HEP to address.    Time  4    Period  Weeks    Target Date  12/12/17      PT LONG TERM GOAL #4   Title  The patient will be further assessed on gait speed and goal to follow, as indicated.    Time  4    Period  Weeks    Target Date  12/12/17             Plan - 10/28/17 1149    Clinical Impression Statement  The patient is a 84 year old female with worsening vertigo since 06/15/2017.  She notes imbalance and dizziness associated with sinus and allergies.  At today's PT evaluation, she has positive head impulse test indicating dec'd VOR, sensations of lightheadedness with standing tasks.  PT to address VOR impairments and further assess balance/gait activities at next session.    History and Personal Factors relevant to plan of care:  vitamin D deficiency, macular degeneration, low back pain    Clinical Presentation  Stable    Clinical Presentation due to:  patient maintaining independence with daily activities    Rehab Potential  Good    PT Frequency  1x / week +eval    PT Duration  4 weeks    PT Treatment/Interventions  ADLs/Self Care Home Management;Therapeutic exercise;Balance training;Neuromuscular re-education;Canalith Repostioning;Gait training;Stair training;Functional mobility training;Therapeutic activities;Patient/family education    PT Next Visit Plan  Check VOR x 1 viewing, Berg, gait speed- provide further HEP for balance, gait activities    Consulted and Agree with Plan of Care  Patient;Family member/caregiver    Family Member Consulted  daughter       Patient will benefit from skilled therapeutic intervention in order to improve the following deficits and impairments:  Abnormal gait, Decreased balance, Dizziness  Visit Diagnosis: Other abnormalities of gait and mobility  Dizziness and giddiness  Unsteadiness on feet     Problem  List Patient Active Problem List   Diagnosis Date Noted  . Rosacea 02/19/2014  . Osteoarthritis of right shoulder 09/11/2013    Class: Diagnosis of  . Macular degeneration - followed by optho, Dr. Brandy Hale 08/24/2012  . Hot flashes - followed by gyn, Dr. Gertie Fey 08/24/2012  . Osteoarthritis of left knee 05/17/2012    Class: Diagnosis of  . LOW BACK PAIN SYNDROME 05/07/2010  . VITAMIN D DEFICIENCY 04/22/2009  . Allergic rhinitis 03/12/2008  . Hyperlipemia 10/22/2006     Amiaya Mcneeley,PT 10/28/2017, 11:58 AM  [facility] [address] [place], [place], [address] Phone: [phone]   Fax:  [phone]  Name: CHRISELDA LEPPERT MRN: [id number] Date of Birth: 1919/07/21

## 2017-11-05 ENCOUNTER — Ambulatory Visit: Payer: Medicare Other | Admitting: Rehabilitative and Restorative Service Providers"

## 2017-11-05 ENCOUNTER — Encounter: Payer: Self-pay | Admitting: Rehabilitative and Restorative Service Providers"

## 2017-11-05 DIAGNOSIS — R42 Dizziness and giddiness: Secondary | ICD-10-CM

## 2017-11-05 DIAGNOSIS — R2681 Unsteadiness on feet: Secondary | ICD-10-CM

## 2017-11-05 DIAGNOSIS — R2689 Other abnormalities of gait and mobility: Secondary | ICD-10-CM | POA: Diagnosis not present

## 2017-11-05 NOTE — Patient Instructions (Signed)
Gaze Stabilization - Tip Card  1.Target must remain in focus, not blurry, and appear stationary while head is in motion. 2.Perform exercises with small head movements (45 to either side of midline). 3.Increase speed of head motion so long as target is in focus. 4.If you wear eyeglasses, be sure you can see target through lens (therapist will give specific instructions for bifocal / progressive lenses). 5.These exercises may provoke dizziness or nausea. Work through these symptoms. If too dizzy, slow head movement slightly. Rest between each exercise. 6.Exercises demand concentration; avoid distractions. 7.For safety, perform standing exercises close to a counter, wall, corner, or next to someone.  Copyright  VHI. All rights reserved.   Gaze Stabilization - Standing Feet Apart   Feet shoulder width apart, keeping eyes on target on wall 3 feet away, tilt head down slightly and move head side to side for 30 seconds. Repeat while moving head up and down for 30 seconds. *Work up to tolerating 60 seconds, as able. Do 2-3 sessions per day.   Copyright  VHI. All rights reserved.    SINGLE LIMB STANCE    Stance: single leg on floor STAND NEAR COUNTERTOP FOR SUPPORT. Raise leg. Hold _10__ seconds. Repeat with other leg. _3__ reps per set, 1___ sets per day, __2_ days per week  Copyright  VHI. All rights reserved.   Feet Together, Head Motion - Eyes Open    With eyes open, feet together, move head slowly: side to side x 10 times.  Then rest and do up and down x 10 times. Do _2___ sessions per day.  Copyright  VHI. All rights reserved.   Feet Together, Varied Arm Positions - Eyes Closed    Stand with feet together and arms at your side. Close eyes and visualize upright position. Hold __30__ seconds. Repeat __3__ times per session. Do __2__ sessions per day.  Copyright  VHI. All rights reserved.

## 2017-11-05 NOTE — Therapy (Signed)
[facility] [address] [place], [place], [address] Phone: [phone]   Fax:  [phone]  Physical Therapy Treatment  Patient Details  Name: Heidi Moore MRN: [id number] Date of Birth: Apr 09, 1920 Referring Provider: Colin Benton, MD   Encounter Date: 11/05/2017  PT End of Session - 11/05/17 1315    Visit Number  2    Number of Visits  5 eval + 4 weeks    Date for PT Re-Evaluation  12/12/17    Authorization Type  [employer] medicare    PT Start Time  1110    PT Stop Time  1155    PT Time Calculation (min)  45 min    Activity Tolerance  Patient tolerated treatment well    Behavior During Therapy  [facility] for tasks assessed/performed       Past Medical History:  Diagnosis Date  . AC (acromioclavicular) joint bone spurs    on right shoulder   . Allergic rhinitis   . Arthritis   . Bursitis    right shoulder  . Complication of anesthesia    headaches  . Diverticulosis   . DIVERTICULOSIS, COLON 10/15/2006   Qualifier: Diagnosis of  By: Linna Darner MD, Gwyndolyn Saxon Eye problem    treated by Dr Kathlen Mody per patient  . Headache(784.0)    sinus HA  . History of blood transfusion 82yrs ago   no abnormal reaction noted  . History of cystitis   . History of skin cancer    Basal cell, Dr. Lindwood Coke  . Hyperlipidemia    [place] Study LDL goal =<160   . Hypotension   . Joint pain   . Joint swelling   . Macular degeneration, dry   . Osteopenia    Intolerance to Calcium,Actonel,Fosamax,Evista, Forteo: S/P Boniva 2007 to 03/2010  . OSTEOPENIA 03/12/2008   Qualifier: Diagnosis of  By: Velora Heckler    . Osteoporosis   . PONV (postoperative nausea and vomiting)   . Rosacea 04/22/2009   Qualifier: Diagnosis of  By: Linna Darner MD, Gwyndolyn Saxon    . Skin cancer 06/22/2017   per patient after excision right calf by Dr Ubaldo Glassing  . SKIN CANCER, HX OF 03/12/2008   Qualifier: Diagnosis of  By: Velora Heckler    . Urinary frequency   .  Vaginal atrophy    sees Dr. Gertie Fey    Past Surgical History:  Procedure Laterality Date  . APPENDECTOMY    . BUNIONECTOMY Bilateral   . CATARACT EXTRACTION, BILATERAL    . COLONOSCOPY     with Tics (no further f/u as per GI due to age)   . KNEE ARTHROPLASTY  05/18/2012   Procedure: COMPUTER ASSISTED TOTAL KNEE ARTHROPLASTY;  Surgeon: Marybelle Killings, MD;  Location: [facility];  Service: Orthopedics;  Laterality: Left;  Left Total Knee Arthroplasty, Cemented, Computer Assisted  . lt knee      meniscus tear  . MOHS SURGERY     right nostril for basel cell cancer   . NOSE SURGERY     due to trama at age 43   . TOTAL ABDOMINAL HYSTERECTOMY W/ BILATERAL SALPINGOOPHORECTOMY     for Endometriosis   . TOTAL SHOULDER ARTHROPLASTY Right 09/11/2013   Procedure: TOTAL SHOULDER ARTHROPLASTY;  Surgeon: Marybelle Killings, MD;  Location: [facility];  Service: Orthopedics;  Laterality: Right;  Right Total Shoulder Arthroplasty    There were no vitals filed for this visit.  Subjective Assessment - 11/05/17 1111  Subjective  The patient reports her right ear "stops up" and that makes her symptoms worse. She notes overall, her symptoms are improving, but today her ear is stopped up.      Pertinent History  L TKR, vitamin D deficiency, low back pain, macular degeneration, h/o "ears stopping up" (with imbalance),  Has seen ENT and had CT scan within last 2 weeks (WNLs per patient),    Patient Stated Goals  "I hope you can help me with the dizziness."      Currently in Pain?  No/denies         [facility] PT Assessment - 11/05/17 1113      Ambulation/Gait   Ambulation/Gait  Yes    Ambulation/Gait Assistance  7: Independent    Ambulation Distance (Feet)  200 Feet    Assistive device  None    Gait Pattern  Within Functional Limits    Ambulation Surface  Level;Indoor    Gait velocity  3.13 ft/sec      Standardized Balance Assessment   Standardized Balance Assessment  Berg Balance Test      Berg Balance Test   Sit to  Stand  Able to stand without using hands and stabilize independently    Standing Unsupported  Able to stand safely 2 minutes    Sitting with Back Unsupported but Feet Supported on Floor or Stool  Able to sit safely and securely 2 minutes    Stand to Sit  Sits safely with minimal use of hands    Transfers  Able to transfer safely, minor use of hands    Standing Unsupported with Eyes Closed  Able to stand 10 seconds safely    Standing Ubsupported with Feet Together  Able to place feet together independently and stand 1 minute safely    From Standing, Reach Forward with Outstretched Arm  Can reach forward >12 cm safely (5")    From Standing Position, Pick up Object from Floor  Able to pick up shoe safely and easily    From Standing Position, Turn to Look Behind Over each Shoulder  Looks behind from both sides and weight shifts well    Turn 360 Degrees  Able to turn 360 degrees safely in 4 seconds or less wave of dizziness; unable to focus    Standing Unsupported, Alternately Place Feet on Step/Stool  Able to stand independently and safely and complete 8 steps in 20 seconds    Standing Unsupported, One Foot in Front  Able to take small step independently and hold 30 seconds    Standing on One Leg  Tries to lift leg/unable to hold 3 seconds but remains standing independently    Total Score  50    Berg comment:  50/56                   [facility] Adult PT Treatment/Exercise - 11/05/17 1113      Neuro Re-ed    Neuro Re-ed Details   Corner balance activities including feet narrow base of support in partial heel/toe with eyes open x 30 seconds,, then switch feet.  Standing with feet together with eyes closed x 30 seconds x 3 reps, standing with feet together with head motion x 10 reps up and down and side to side.  Single leg stance activities.       Vestibular Treatment/Exercise - 11/05/17 1428      Vestibular Treatment/Exercise   Vestibular Treatment Provided  Gaze    Gaze Exercises  X1  Viewing  Horizontal;X1 Viewing Vertical      X1 Viewing Horizontal   Foot Position  stamdomg    Comments  x 30 seconds near support surface with cues on speed and range of motion      X1 Viewing Vertical   Foot Position  standing    Comments  x 30 seconds with cues on range of motion            PT Education - 11/05/17 1311    Education Details  Updated HEP :  gaze in standing, feet together + head motion, feet togehter + eyes closed, single leg stance    Person(s) Educated  Patient    Methods  Explanation;Demonstration;Handout    Comprehension  Verbalized understanding;Returned demonstration          PT Long Term Goals - 11/05/17 1119      PT LONG TERM GOAL #1   Title  The patient will be indep with HEP for gaze adaptation, high level balance, and habituation as indicated    Time  4    Period  Weeks      PT LONG TERM GOAL #2   Title  The patient will tolerate gaze x 1 viewing x 30 seconds without subjective increase in dizziness or reports of visual blurring.    Time  4    Period  Weeks      PT LONG TERM GOAL #3   Title  The patient will be further assessed on Berg and LTG to follow/ HEP to address.    Baseline  Berg=50/56 Plan to address via HEP    Time  4    Period  Weeks    Status  Achieved      PT LONG TERM GOAL #4   Title  The patient will be further assessed on gait speed and goal to follow, as indicated.    Baseline  3.13 ft/sec- no goal needed.     Time  4    Period  Weeks    Status  Achieved            Plan - 11/05/17 1429    Clinical Impression Statement  The patient does not score in high fall risk range per Merrilee Jansky or gait speed.  PT to address noted deficits via HEP.  Plan to continue working to [employer] as indicated.     PT Treatment/Interventions  ADLs/Self Care Home Management;Therapeutic exercise;Balance training;Neuromuscular re-education;Canalith Repostioning;Gait training;Stair training;Functional mobility  training;Therapeutic activities;Patient/family education    PT Next Visit Plan  check HEP, progress as indicated,  dynamic gait activities; begin d/c planning    Consulted and Agree with Plan of Care  Patient       Patient will benefit from skilled therapeutic intervention in order to improve the following deficits and impairments:  Abnormal gait, Decreased balance, Dizziness  Visit Diagnosis: Other abnormalities of gait and mobility  Unsteadiness on feet  Dizziness and giddiness     Problem List Patient Active Problem List   Diagnosis Date Noted  . Rosacea 02/19/2014  . Osteoarthritis of right shoulder 09/11/2013    Class: Diagnosis of  . Macular degeneration - followed by optho, Dr. Brandy Hale 08/24/2012  . Hot flashes - followed by gyn, Dr. Gertie Fey 08/24/2012  . Osteoarthritis of left knee 05/17/2012    Class: Diagnosis of  . LOW BACK PAIN SYNDROME 05/07/2010  . VITAMIN D DEFICIENCY 04/22/2009  . Allergic rhinitis 03/12/2008  . Hyperlipemia 10/22/2006    Lillyona Polasek, PT 11/05/2017, 2:30  PM  [facility] [address] [place], [place], [address] Phone: [phone]   Fax:  [phone]  Name: MYKELLE COCKERELL MRN: [id number] Date of Birth: 06/06/1919

## 2017-11-18 ENCOUNTER — Ambulatory Visit: Payer: Medicare Other | Admitting: Rehabilitative and Restorative Service Providers"

## 2017-11-19 ENCOUNTER — Encounter: Payer: Self-pay | Admitting: Rehabilitative and Restorative Service Providers"

## 2017-11-19 ENCOUNTER — Ambulatory Visit: Payer: Medicare Other | Attending: Family Medicine | Admitting: Rehabilitative and Restorative Service Providers"

## 2017-11-19 DIAGNOSIS — R2689 Other abnormalities of gait and mobility: Secondary | ICD-10-CM | POA: Insufficient documentation

## 2017-11-19 DIAGNOSIS — R42 Dizziness and giddiness: Secondary | ICD-10-CM | POA: Insufficient documentation

## 2017-11-19 DIAGNOSIS — R2681 Unsteadiness on feet: Secondary | ICD-10-CM | POA: Insufficient documentation

## 2017-11-19 NOTE — Therapy (Signed)
[facility] [address] [place], [place], [address] Phone: [phone]   Fax:  [phone]  Physical Therapy Treatment  Patient Details  Name: Heidi Moore MRN: [id number] Date of Birth: 1920-03-24 Referring Provider: Colin Benton, MD   Encounter Date: 11/19/2017  PT End of Session - 11/19/17 1237    Visit Number  3    Number of Visits  5 eval + 4 weeks    Date for PT Re-Evaluation  12/12/17    Authorization Type  [employer] medicare    PT Start Time  1234    PT Stop Time  1301    PT Time Calculation (min)  27 min    Activity Tolerance  Patient tolerated treatment well    Behavior During Therapy  [facility] for tasks assessed/performed       Past Medical History:  Diagnosis Date  . AC (acromioclavicular) joint bone spurs    on right shoulder   . Allergic rhinitis   . Arthritis   . Bursitis    right shoulder  . Complication of anesthesia    headaches  . Diverticulosis   . DIVERTICULOSIS, COLON 10/15/2006   Qualifier: Diagnosis of  By: Linna Darner MD, Gwyndolyn Saxon Eye problem    treated by Dr Kathlen Mody per patient  . Headache(784.0)    sinus HA  . History of blood transfusion 12yr ago   no abnormal reaction noted  . History of cystitis   . History of skin cancer    Basal cell, Dr. FLindwood Coke . Hyperlipidemia    [place] Study LDL goal =<160   . Hypotension   . Joint pain   . Joint swelling   . Macular degeneration, dry   . Osteopenia    Intolerance to Calcium,Actonel,Fosamax,Evista, Forteo: S/P Boniva 2007 to 03/2010  . OSTEOPENIA 03/12/2008   Qualifier: Diagnosis of  By: HVelora Heckler   . Osteoporosis   . PONV (postoperative nausea and vomiting)   . Rosacea 04/22/2009   Qualifier: Diagnosis of  By: HLinna DarnerMD, WGwyndolyn Saxon   . Skin cancer 06/22/2017   per patient after excision right calf by Dr LUbaldo Glassing . SKIN CANCER, HX OF 03/12/2008   Qualifier: Diagnosis of  By: HVelora Heckler   . Urinary frequency   .  Vaginal atrophy    sees Dr. TGertie Fey   Past Surgical History:  Procedure Laterality Date  . APPENDECTOMY    . BUNIONECTOMY Bilateral   . CATARACT EXTRACTION, BILATERAL    . COLONOSCOPY     with Tics (no further f/u as per GI due to age)   . KNEE ARTHROPLASTY  05/18/2012   Procedure: COMPUTER ASSISTED TOTAL KNEE ARTHROPLASTY;  Surgeon: MMarybelle Killings MD;  Location[facility]  Service: Orthopedics;  Laterality: Left;  Left Total Knee Arthroplasty, Cemented, Computer Assisted  . lt knee      meniscus tear  . MOHS SURGERY     right nostril for basel cell cancer   . NOSE SURGERY     due to trama at age 89  . TOTAL ABDOMINAL HYSTERECTOMY W/ BILATERAL SALPINGOOPHORECTOMY     for Endometriosis   . TOTAL SHOULDER ARTHROPLASTY Right 09/11/2013   Procedure: TOTAL SHOULDER ARTHROPLASTY;  Surgeon: MMarybelle Killings MD;  [facility]  Service: Orthopedics;  Laterality: Right;  Right Total Shoulder Arthroplasty    There were no vitals filed for this visit.  Subjective Assessment - 11/19/17 1234  Subjective  The patient reports "it's gotten better when I turn my head"; she notes a general "fuzziness" when she closes her eyes.  Her head is stopped up today and this leads to ears stopping up and balance.  She notes her headache "feels like it's in my eyes).    Pertinent History  L TKR, vitamin D deficiency, low back pain, macular degeneration, h/o "ears stopping up" (with imbalance),  Has seen ENT and had CT scan within last 2 weeks (WNLs per patient),    Patient Stated Goals  "I hope you can help me with the dizziness."      Currently in Pain?  No/denies                       [facility] Adult PT Treatment/Exercise - 11/19/17 1247      Ambulation/Gait   Ambulation/Gait  Yes    Ambulation/Gait Assistance  7: Independent    Ambulation Distance (Feet)  200 Feet    Assistive device  None    Gait Pattern  Within Functional Limits    Ambulation Surface  Level      Self-Care   Self-Care   Other Self-Care Comments    Other Self-Care Comments   discussed continuation of HEP and silver sneakers program/ PT will follow up in 3 weeks.      Neuro Re-ed    Neuro Re-ed Details   Reviewed gaze x 1 standing without dizziness.  Patient performed single leg stance near countertop for support.  Performed standing hip ER/Abduction for contralateral hip stablity x 5 reps each side.       Exercises   Exercises  Other Exercises    Other Exercises   Seated hamstring stretch for knee flexibility/mobility (reports stiffness when waking).                   PT Long Term Goals - 11/19/17 1255      PT LONG TERM GOAL #1   Title  The patient will be indep with HEP for gaze adaptation, high level balance, and habituation as indicated    Time  4    Period  Weeks    Status  On-going    Target Date  12/12/17      PT LONG TERM GOAL #2   Title  The patient will tolerate gaze x 1 viewing x 30 seconds without subjective increase in dizziness or reports of visual blurring.    Baseline  Able to tolerate gaze activities without dizziness or blurring.    Time  4    Period  Weeks    Status  Achieved      PT LONG TERM GOAL #3   Title  The patient will be further assessed on Berg and LTG to follow/ HEP to address.    Baseline  Berg=50/56 Plan to address via HEP    Time  4    Period  Weeks    Status  Achieved      PT LONG TERM GOAL #4   Title  The patient will be further assessed on gait speed and goal to follow, as indicated.    Baseline  3.13 ft/sec- no goal needed.     Time  4    Period  Weeks    Status  Achieved            Plan - 11/19/17 1256    Clinical Impression Statement  The patient has met 3/4 LTGs.  She  has many questions about her head stopping up and ear stopping up.  PT recommended she ask MD about this symptom.  PT recommends she continue current HEP and f/u with PT early August after continuing to work on [employer].     PT Treatment/Interventions  ADLs/Self Care Home  Management;Therapeutic exercise;Balance training;Neuromuscular re-education;Canalith Repostioning;Gait training;Stair training;Functional mobility training;Therapeutic activities;Patient/family education    PT Next Visit Plan  check HEP, d/c if doing well.     Consulted and Agree with Plan of Care  Patient       Patient will benefit from skilled therapeutic intervention in order to improve the following deficits and impairments:  Abnormal gait, Decreased balance, Dizziness  Visit Diagnosis: Other abnormalities of gait and mobility  Unsteadiness on feet  Dizziness and giddiness     Problem List Patient Active Problem List   Diagnosis Date Noted  . Rosacea 02/19/2014  . Osteoarthritis of right shoulder 09/11/2013    Class: Diagnosis of  . Macular degeneration - followed by optho, Dr. Brandy Hale 08/24/2012  . Hot flashes - followed by gyn, Dr. Gertie Fey 08/24/2012  . Osteoarthritis of left knee 05/17/2012    Class: Diagnosis of  . LOW BACK PAIN SYNDROME 05/07/2010  . VITAMIN D DEFICIENCY 04/22/2009  . Allergic rhinitis 03/12/2008  . Hyperlipemia 10/22/2006    Malaisha Silliman , PT 11/19/2017, 1:04 PM  [facility] [address] [place], [place], [address] Phone: [phone]   Fax:  [phone]  Name: MARYELA TAPPER MRN: [id number] Date of Birth: 01-22-1920

## 2017-11-22 ENCOUNTER — Ambulatory Visit: Payer: Medicare Other | Admitting: Neurology

## 2017-11-22 ENCOUNTER — Encounter: Payer: Self-pay | Admitting: Neurology

## 2017-11-22 ENCOUNTER — Encounter

## 2017-11-22 ENCOUNTER — Other Ambulatory Visit: Payer: Self-pay

## 2017-11-22 VITALS — BP 132/64 | HR 70 | Ht 61.5 in | Wt 117.0 lb

## 2017-11-22 DIAGNOSIS — R42 Dizziness and giddiness: Secondary | ICD-10-CM

## 2017-11-22 DIAGNOSIS — I69198 Other sequelae of nontraumatic intracerebral hemorrhage: Secondary | ICD-10-CM

## 2017-11-22 NOTE — Patient Instructions (Signed)
Continue the exercise

## 2017-11-22 NOTE — Progress Notes (Signed)
NEUROLOGY CONSULTATION NOTE  Heidi Moore MRN: [id number] DOB: 1919/11/27  Referring provider: Dr. Maudie Mercury Primary care provider: Dr. Maudie Mercury  Reason for consult:  vertigo  HISTORY OF PRESENT ILLNESS: Heidi Moore is a 84 year old female who presents for vertigo.  She is accompanied by her daughter who supplements history.  History supplemented by referring provider's note.    She has history of "sinus problems" every year.  During allergy season, she will start feeling stuffed up in the head, face and right ear.  It is often accompanied by a dull non-throbbing holocephalic headache.  She began experiencing head/face pressure and right aural fullness beginning in late February.  At this time, she began experiencing dizziness.  It was described a sense of movement but not spinning and associated with feeling off balance.  There was no associated nausea, vomiting, double vision, hearing loss, tinnitus or unilateral numbness or weakness.  Symptoms would occur when she is up and walking but would resolve when laying down or sitting.  It would occur for several minutes up to a half hour.  She was evaluated by ENT in May.  CT maxillofacial was unremarkable.  She went for vestibular rehabilitation which has been effective.  She says it is almost resolved.  Now when she feels dizzy, she will perform the vestibular exercises and make her ear pop, which helps relieve her symptoms.  PAST MEDICAL HISTORY: Past Medical History:  Diagnosis Date  . AC (acromioclavicular) joint bone spurs    on right shoulder   . Allergic rhinitis   . Arthritis   . Bursitis    right shoulder  . Complication of anesthesia    headaches  . Diverticulosis   . DIVERTICULOSIS, COLON 10/15/2006   Qualifier: Diagnosis of  By: Linna Darner MD, Gwyndolyn Saxon Eye problem    treated by Dr Kathlen Mody per patient  . Headache(784.0)    sinus HA  . History of blood transfusion 19yrs ago   no abnormal reaction noted  . History of cystitis   .  History of skin cancer    Basal cell, Dr. Lindwood Coke  . Hyperlipidemia    [place] Study LDL goal =<160   . Hypotension   . Joint pain   . Joint swelling   . Macular degeneration, dry   . Osteopenia    Intolerance to Calcium,Actonel,Fosamax,Evista, Forteo: S/P Boniva 2007 to 03/2010  . OSTEOPENIA 03/12/2008   Qualifier: Diagnosis of  By: Velora Heckler    . Osteoporosis   . PONV (postoperative nausea and vomiting)   . Rosacea 04/22/2009   Qualifier: Diagnosis of  By: Linna Darner MD, Gwyndolyn Saxon    . Skin cancer 06/22/2017   per patient after excision right calf by Dr Ubaldo Glassing  . SKIN CANCER, HX OF 03/12/2008   Qualifier: Diagnosis of  By: Velora Heckler    . Urinary frequency   . Vaginal atrophy    sees Dr. Gertie Fey    PAST SURGICAL HISTORY: Past Surgical History:  Procedure Laterality Date  . APPENDECTOMY    . BUNIONECTOMY Bilateral   . CATARACT EXTRACTION, BILATERAL    . COLONOSCOPY     with Tics (no further f/u as per GI due to age)   . KNEE ARTHROPLASTY  05/18/2012   Procedure: COMPUTER ASSISTED TOTAL KNEE ARTHROPLASTY;  Surgeon: Marybelle Killings, MD;  Location: [facility];  Service: Orthopedics;  Laterality: Left;  Left Total Knee Arthroplasty, Cemented, Computer Assisted  . lt knee  meniscus tear  . MOHS SURGERY     right nostril for basel cell cancer   . NOSE SURGERY     due to trama at age 82   . TOTAL ABDOMINAL HYSTERECTOMY W/ BILATERAL SALPINGOOPHORECTOMY     for Endometriosis   . TOTAL SHOULDER ARTHROPLASTY Right 09/11/2013   Procedure: TOTAL SHOULDER ARTHROPLASTY;  Surgeon: Marybelle Killings, MD;  Location: [facility];  Service: Orthopedics;  Laterality: Right;  Right Total Shoulder Arthroplasty    MEDICATIONS: Current Outpatient Medications on File Prior to Visit  Medication Sig Dispense Refill  . B Complex-C (B-COMPLEX WITH VITAMIN C) tablet Take 1 tablet by mouth daily.    . cholecalciferol (VITAMIN D) 1000 UNITS tablet Take 1,000 Units by mouth daily.     [name] estradiol (ESTRACE)  0.5 MG tablet Take 0.5 mg by mouth daily.    [name] FLUOCINOLONE ACETONIDE OT Place in ear(s).    . fluticasone (FLONASE) 50 MCG/ACT nasal spray Place into both nostrils daily.    [name] OVER THE COUNTER MEDICATION Take 1 capsule by mouth 2 (two) times daily. [place]7 daily.    [name] OVER THE COUNTER MEDICATION 2 (two) times daily. Preservision vitamin for eyes    . Polyethyl Glycol-Propyl Glycol (SYSTANE OP) Apply to eye daily.    . Travoprost, BAK Free, (TRAVATAN) 0.004 % SOLN ophthalmic solution 1 drop at bedtime.     No current facility-administered medications on file prior to visit.     ALLERGIES: Allergies  Allergen Reactions  . Alendronate Sodium Nausea And Vomiting  . Anaplex Hd Nausea And Vomiting  . Aspirin Nausea And Vomiting  . Azithromycin Nausea And Vomiting  . Calcium Nausea And Vomiting  . Codeine Nausea And Vomiting  . Doxycycline Nausea And Vomiting  . Hydrocodone Nausea And Vomiting  . Ibandronate Sodium     REACTION: severe body aches  . Ibuprofen Nausea And Vomiting  . Penicillins Nausea And Vomiting  . Raloxifene Nausea And Vomiting  . Risedronate Sodium Nausea And Vomiting  . Sulfonamide Derivatives Nausea And Vomiting  . Teriparatide Nausea And Vomiting    FAMILY HISTORY: Family History  Problem Relation Age of Onset  . Colon cancer Other        1st Cousin   . Stroke Mother 76  . Diabetes Brother   . Atrial fibrillation Brother        coumadin  . Lung cancer Sister     SOCIAL HISTORY: Social History   Socioeconomic History  . Marital status: Widowed    Spouse name: Not on file  . Number of children: 3  . Years of education: Not on file  . Highest education level: Some college, no degree  Occupational History    Employer: RETIRED  Social Needs  . Financial resource strain: Not on file  . Food insecurity:    Worry: Not on file    Inability: Not on file  . Transportation needs:    Medical: Not on file    Non-medical: Not on file    Tobacco Use  . Smoking status: Never Smoker  . Smokeless tobacco: Never Used  Substance and Sexual Activity  . Alcohol use: No  . Drug use: No  . Sexual activity: Not on file  Lifestyle  . Physical activity:    Days per week: Not on file    Minutes per session: Not on file  . Stress: Not on file  Relationships  . Social connections:    Talks on  phone: Not on file    Gets together: Not on file    Attends religious service: Not on file    Active member of club or organization: Not on file    Attends meetings of clubs or organizations: Not on file    Relationship status: Not on file  . Intimate partner violence:    Fear of current or ex partner: Not on file    Emotionally abused: Not on file    Physically abused: Not on file    Forced sexual activity: Not on file  Other Topics Concern  . 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 She lives alone. She goes to the gym and participates in aerobics 3 x week. She drinks 3 cups of coffee a day.          REVIEW OF SYSTEMS: Constitutional: No fevers, chills, or sweats, no generalized fatigue, change in appetite Eyes: No visual changes, double vision, eye pain Ear, nose and throat: No hearing loss, ear pain, nasal congestion, sore throat Cardiovascular: No chest pain, palpitations Respiratory:  No shortness of breath at rest or with exertion, wheezes GastrointestinaI: No nausea, vomiting, diarrhea, abdominal pain, fecal incontinence Genitourinary:  No dysuria, urinary retention  or frequency Musculoskeletal:  No neck pain, back pain Integumentary: No rash, pruritus, skin lesions Neurological: as above Psychiatric: No depression, insomnia, anxiety Endocrine: No palpitations, fatigue, diaphoresis, mood swings, change in appetite, change in weight, increased thirst Hematologic/Lymphatic:  No purpura, petechiae. Allergic/Immunologic: no itchy/runny eyes, nasal congestion, recent allergic reactions, rashes  PHYSICAL EXAM: Vitals:   11/22/17 1359  BP: 132/64  Pulse: 70  SpO2: 95%   General: No acute distress.  Patient appears well-groomed.  Head:  Normocephalic/atraumatic Eyes:  fundi examined but not visualized Neck: supple, no paraspinal tenderness, full range of motion Back: No paraspinal tenderness Heart: regular rate and rhythm Lungs: Clear to auscultation bilaterally. Vascular: No carotid bruits. Neurological Exam: Mental status: alert and oriented to person, place, and time, recent and remote memory intact, fund of knowledge intact, attention and concentration intact, speech fluent and not dysarthric, language intact. Cranial nerves: CN I: not tested CN II: pupils equal, round and reactive to light, visual fields intact CN III, IV, VI:  full range of motion, no nystagmus, no ptosis CN V: facial sensation intact CN VII: upper and lower face symmetric CN VIII: hearing intact CN IX, X: gag intact, uvula midline CN XI: sternocleidomastoid and trapezius muscles intact CN XII: tongue midline Bulk & Tone: normal, no fasciculations. Motor:  5/5 throughout  Sensation: temperature and vibration sensation intact. Deep Tendon Reflexes:  2+ throughout, toes downgoing.  Finger to nose testing:  Without dysmetria.  Heel to shin:  Without dysmetria.  Gait:  Normal station and stride.  Able to turn and tandem walk. Romberg negative.  IMPRESSION: Vertigo involving the right ear.  It is not classic benign paroxysmal positional vertigo, but does appear to be  peripheral  in origin.  She also exhibits some associated eustachian tube dysfucntion  PLAN: Management is vestibular rehabilitation, which seems to be helping If needed, she may follow up to consider alternative therapy, such as antidepressants.  Thank you for allowing me to take part in the care of this patient.  Metta Clines, DO  CC:  Colin Benton, DO

## 2017-11-26 ENCOUNTER — Encounter

## 2017-12-02 ENCOUNTER — Encounter: Payer: Medicare Other | Admitting: Rehabilitative and Restorative Service Providers"

## 2017-12-08 ENCOUNTER — Ambulatory Visit (INDEPENDENT_AMBULATORY_CARE_PROVIDER_SITE_OTHER): Payer: Self-pay

## 2017-12-08 ENCOUNTER — Ambulatory Visit (INDEPENDENT_AMBULATORY_CARE_PROVIDER_SITE_OTHER): Payer: Medicare Other

## 2017-12-08 ENCOUNTER — Encounter (INDEPENDENT_AMBULATORY_CARE_PROVIDER_SITE_OTHER): Payer: Self-pay | Admitting: Orthopaedic Surgery

## 2017-12-08 ENCOUNTER — Ambulatory Visit (INDEPENDENT_AMBULATORY_CARE_PROVIDER_SITE_OTHER): Payer: Medicare Other | Admitting: Orthopaedic Surgery

## 2017-12-08 VITALS — BP 134/64 | HR 80 | Ht 61.5 in | Wt 115.0 lb

## 2017-12-08 DIAGNOSIS — M25512 Pain in left shoulder: Secondary | ICD-10-CM | POA: Diagnosis not present

## 2017-12-08 DIAGNOSIS — M25562 Pain in left knee: Secondary | ICD-10-CM

## 2017-12-08 DIAGNOSIS — M25552 Pain in left hip: Secondary | ICD-10-CM | POA: Diagnosis not present

## 2017-12-08 NOTE — Progress Notes (Signed)
Office Visit Note   Patient: Heidi Moore           Date of Birth: [birth year]           MRN: [id number] Visit Date: 12/08/2017              Requested by: Lucretia Kern, DO [place], [place] [id number] PCP: Lucretia Kern, DO   Assessment & Plan: Visit Diagnoses:  1. Left knee pain, unspecified chronicity   2. Pain in left hip   3. Acute pain of left shoulder     Plan: We discussed the quad deficit she has in her left knee post total knee arthroplasty 2014.  We went over multiple exercises with patient and her daughter to work on quad strengthening and she can incorporate these when she goes to work out at [employer] as well.  She will continue using her walker until her strength is improved.  Office follow-up if she has persistent symptoms.  We took her daughter's persecute on her ankle and reviewed the quad weakness that is present and again at her demonstrate exercises to work on strengthening.  Offered her formal physical therapy outpatient she states she like to work on it on her own.  She will return if she has persistent problems.  Follow-Up Instructions: No follow-ups on file.   Orders:  Orders Placed This Encounter  Procedures  . XR Knee 1-2 Views Left  . XR HIP UNILAT W OR W/O PELVIS 2-3 VIEWS LEFT  . XR Shoulder Left   No orders of the defined types were placed in this encounter.     Procedures: No procedures performed   Clinical Data: No additional findings.   Subjective: Chief Complaint  Patient presents with  . Left Knee - Pain  . Left Shoulder - Pain  . Left Hip - Pain    HPI 84 year old female who participates in Silver sneakers had a fall 2-1/2 weeks ago.  This is her second fall within 18 months.  She landed on her left side with bruising on her shoulder mild more bruising lateral thigh below the trochanter and lateral knee.  She is ambulating with a rolling walker she still normally does her own yard mowing.  Previous left total  knee arthroplasty 2014 with some mild residual quad weakness.  She still cannot go down stairs with reciprocal gait but can walk up them hanging onto the rail.  Silver sneakers twice a week.  She is able to get her arm up overhead.  She was walking without a walker before her fall.  Review of Systems 14 point review of systems updated unchanged from last office visit other than as mentioned in HPI.  She is taking a supplement which is supposed to be a muscle relaxant that she obtains in a nutrition store which she states tends to help which is cold formula 303.   Objective: Vital Signs: BP 134/64   Pulse 80   Ht 5' 1.5" (1.562 m)   Wt 115 lb (52.2 kg)   BMI 21.38 kg/m   Physical Exam  Constitutional: She is oriented to person, place, and time. She appears well-developed.  HENT:  Head: Normocephalic.  Right Ear: External ear normal.  Left Ear: External ear normal.  Eyes: Pupils are equal, round, and reactive to light.  Neck: No tracheal deviation present. No thyromegaly present.  Cardiovascular: Normal rate.  Pulmonary/Chest: Effort normal.  Abdominal: Soft.  Neurological: She is alert and oriented  to person, place, and time.  Skin: Skin is warm and dry.  Psychiatric: She has a normal mood and affect. Her behavior is normal.    Ortho Exam previous right shoulder surgical scars with arthroscopic portals well-healed.  She can let get her left arm up overhead without pain.  Well-healed midline left knee incision without knee effusion.  There is bruising over the lateral femoral condyle.  Bruising inferior to the trochanter and large area 7 x 10 inferior to the greater trochanter.  No pain with hip range of motion and no limitation of internal rotation.  With resisted testing she has mild quad weakness on the left versus right.  Distal pulses are intact compartments are soft.  Abductors are strong.  Specialty Comments:  No specialty comments available.  Imaging: Xr Hip Unilat W Or W/o  Pelvis 2-3 Views Left  Result Date: 12/08/2017 AP frog-leg left hip obtained and reviewed.  This is negative for acute fracture post fall.  No degenerative arthritis is noted. Impression: Normal hip x-rays, left.  Xr Knee 1-2 Views Left  Result Date: 12/08/2017 AP lateral left knee x-rays obtained and reviewed.  This shows well-positioned total knee arthroplasty.  Negative for periprosthetic fracture. Impression: Post left total knee arthroplasty.  Negative for fracture post fall.    PMFS History: Patient Active Problem List   Diagnosis Date Noted  . Rosacea 02/19/2014  . Osteoarthritis of right shoulder 09/11/2013    Class: Diagnosis of  . Macular degeneration - followed by optho, Dr. Brandy Hale 08/24/2012  . Hot flashes - followed by gyn, Dr. Gertie Fey 08/24/2012  . Osteoarthritis of left knee 05/17/2012    Class: Diagnosis of  . LOW BACK PAIN SYNDROME 05/07/2010  . VITAMIN D DEFICIENCY 04/22/2009  . Allergic rhinitis 03/12/2008  . Hyperlipemia 10/22/2006   Past Medical History:  Diagnosis Date  . AC (acromioclavicular) joint bone spurs    on right shoulder   . Allergic rhinitis   . Arthritis   . Bursitis    right shoulder  . Complication of anesthesia    headaches  . Diverticulosis   . DIVERTICULOSIS, COLON 10/15/2006   Qualifier: Diagnosis of  By: Linna Darner MD, Gwyndolyn Saxon Eye problem    treated by Dr Kathlen Mody per patient  . Headache(784.0)    sinus HA  . History of blood transfusion 48yrs ago   no abnormal reaction noted  . History of cystitis   . History of skin cancer    Basal cell, Dr. Lindwood Coke  . Hyperlipidemia    F[place]ingham Study LDL goal =<160   . Hypotension   . Joint pain   . Joint swelling   . Macular degeneration, dry   . Osteopenia    Intolerance to Calcium,Actonel,Fosamax,Evista, Forteo: S/P Boniva 2007 to 03/2010  . OSTEOPENIA 03/12/2008   Qualifier: Diagnosis of  By: Velora Heckler    . Osteoporosis   . PONV (postoperative nausea and vomiting)   .  Rosacea 04/22/2009   Qualifier: Diagnosis of  By: Linna Darner MD, Gwyndolyn Saxon    . Skin cancer 06/22/2017   per patient after excision right calf by Dr Ubaldo Glassing  . SKIN CANCER, HX OF 03/12/2008   Qualifier: Diagnosis of  By: Velora Heckler    . Urinary frequency   . Vaginal atrophy    sees Dr. Gertie Fey    Family History  Problem Relation Age of Onset  . Colon cancer Other        1st Cousin   .  Stroke Mother 9  . Diabetes Brother   . Atrial fibrillation Brother        coumadin  . Lung cancer Sister     Past Surgical History:  Procedure Laterality Date  . APPENDECTOMY    . BUNIONECTOMY Bilateral   . CATARACT EXTRACTION, BILATERAL    . COLONOSCOPY     with Tics (no further f/u as per GI due to age)   . KNEE ARTHROPLASTY  05/18/2012   Procedure: COMPUTER ASSISTED TOTAL KNEE ARTHROPLASTY;  Surgeon: Marybelle Killings, MD;  Location: [facility];  Service: Orthopedics;  Laterality: Left;  Left Total Knee Arthroplasty, Cemented, Computer Assisted  . lt knee      meniscus tear  . MOHS SURGERY     right nostril for basel cell cancer   . NOSE SURGERY     due to trama at age 62   . TOTAL ABDOMINAL HYSTERECTOMY W/ BILATERAL SALPINGOOPHORECTOMY     for Endometriosis   . TOTAL SHOULDER ARTHROPLASTY Right 09/11/2013   Procedure: TOTAL SHOULDER ARTHROPLASTY;  Surgeon: Marybelle Killings, MD;  Location: [facility];  Service: Orthopedics;  Laterality: Right;  Right Total Shoulder Arthroplasty   Social History   Occupational History    Employer: RETIRED  Tobacco Use  . Smoking status: Never Smoker  . Smokeless tobacco: Never Used  Substance and Sexual Activity  . Alcohol use: No  . Drug use: No  . Sexual activity: Not on file

## 2017-12-09 ENCOUNTER — Encounter: Payer: Medicare Other | Admitting: Rehabilitative and Restorative Service Providers"

## 2017-12-09 ENCOUNTER — Encounter: Payer: Self-pay | Admitting: Rehabilitative and Restorative Service Providers"

## 2017-12-09 NOTE — Therapy (Signed)
[facility] [address] [place], [place], [address] Phone: [phone]   Fax:  [phone]  Patient Details  Name: LINETTE GUNDERSON MRN: [id number] Date of Birth: Aug 13, 1919 Referring Provider:  No ref. provider found  Encounter Date: last encounter 11/19/2017  PHYSICAL THERAPY DISCHARGE SUMMARY  Visits from Start of Care: 3  Current functional level related to goals / functional outcomes:   PT Long Term Goals - 11/19/17 1255      PT LONG TERM GOAL #1   Title  The patient will be indep with HEP for gaze adaptation, high level balance, and habituation as indicated    Time  4    Period  Weeks    Status  On-going    Target Date  12/12/17      PT LONG TERM GOAL #2   Title  The patient will tolerate gaze x 1 viewing x 30 seconds without subjective increase in dizziness or reports of visual blurring.    Baseline  Able to tolerate gaze activities without dizziness or blurring.    Time  4    Period  Weeks    Status  Achieved      PT LONG TERM GOAL #3   Title  The patient will be further assessed on Berg and LTG to follow/ HEP to address.    Baseline  Berg=50/56 Plan to address via HEP    Time  4    Period  Weeks    Status  Achieved      PT LONG TERM GOAL #4   Title  The patient will be further assessed on gait speed and goal to follow, as indicated.    Baseline  3.13 ft/sec- no goal needed.     Time  4    Period  Weeks    Status  Achieved         Remaining deficits: Patient was feeling that she had returned to baseline level of function.   Education / Equipment: HEP, return to community exercise.  Plan: Patient agrees to discharge.  Patient goals were met. Patient is being discharged due to meeting the stated rehab goals.  ?????    * Patient cancelled last PT visit due to feeling that she could continue exercises independently.   Thank you for the referral of this patient. Rudell Cobb,  MPT    Elayjah Chaney 12/09/2017, 12:59 PM  [facility] [address] [place], [place], [address] Phone: [phone]   Fax:  [phone]

## 2017-12-15 ENCOUNTER — Telehealth (INDEPENDENT_AMBULATORY_CARE_PROVIDER_SITE_OTHER): Payer: Self-pay | Admitting: Radiology

## 2017-12-15 NOTE — Telephone Encounter (Signed)
Message from [place] you see if Dr. Lorin [name] will put in a order for patient Heidi Moore DOB 1919-09-07, MRN [id number]. Seen Dr. Lorin [name] last Wednesday after she fell and hurt herself. Said hurting all over still and needing a cane order (QUAD) is what the nurse at [facility] said that she possibly needs. Wants a call back [phone].  Thx Tisha   Can you please address?  Thanks!

## 2017-12-16 ENCOUNTER — Telehealth (INDEPENDENT_AMBULATORY_CARE_PROVIDER_SITE_OTHER): Payer: Self-pay | Admitting: Orthopaedic Surgery

## 2017-12-16 ENCOUNTER — Encounter: Payer: Medicare Other | Admitting: Rehabilitative and Restorative Service Providers"

## 2017-12-16 ENCOUNTER — Telehealth (INDEPENDENT_AMBULATORY_CARE_PROVIDER_SITE_OTHER): Payer: Self-pay | Admitting: Radiology

## 2017-12-16 NOTE — Telephone Encounter (Signed)
Patient called again and asked to get a cane, per other note Quad cane, needs rubber tips.  Can you see another provider can authorize a Rx for this and fax it to [facility] on [address]?  She is anxious to get the cane, as she has fallen.  She can go without the walker but still needs something.  Please call her once you have faxed the Rx for cane to [facility].  Please also giver her their number so she can call them to verify the received it and discuss pickup of cane.  Thanks.

## 2017-12-16 NOTE — Telephone Encounter (Signed)
Duplicate message. Message sent to Dr. Sharol Given to advise.

## 2017-12-16 NOTE — Telephone Encounter (Signed)
Error, message has been sent

## 2017-12-16 NOTE — Telephone Encounter (Signed)
Can you please advise since Dr. Lorin [name] is out of the office?  OK for order for quad cane?

## 2017-12-16 NOTE — Telephone Encounter (Signed)
Ok for quad cane

## 2017-12-17 NOTE — Telephone Encounter (Signed)
Script faxed to [facility]. I called patient and advised. She will call them to see what she needs to do about pick up.

## 2018-02-04 ENCOUNTER — Telehealth (INDEPENDENT_AMBULATORY_CARE_PROVIDER_SITE_OTHER): Payer: Self-pay | Admitting: Orthopaedic Surgery

## 2018-02-04 NOTE — Telephone Encounter (Signed)
Please advise 

## 2018-02-04 NOTE — Telephone Encounter (Signed)
I left voicemail advising. ?

## 2018-02-04 NOTE — Telephone Encounter (Signed)
Tell her to try some aspercreme.

## 2018-02-04 NOTE — Telephone Encounter (Signed)
Patient called stating she is still having some discomfort from her fall in July 2019.  She came in to see Lorin [name] but feels she needs some sort of rubbing cream. Patient can't take aspirin it upsets her stomach. Trying to stop taking Ibuprofen because it too upsets her stomach.  Requesting something for inflammation.   Please call patient to advise.[phone]  Wants it sent to [facility] if approved  Patient stated if she is not home feel free to leave a detailed message for her on her answering machine.

## 2018-02-25 ENCOUNTER — Telehealth: Payer: Self-pay | Admitting: Family Medicine

## 2018-02-25 NOTE — Telephone Encounter (Signed)
Recommend she follow orthopedic specialist advice or schedule appt here for eval. Thanks.

## 2018-02-25 NOTE — Telephone Encounter (Signed)
Left message on machine  CRM 

## 2018-02-25 NOTE — Telephone Encounter (Addendum)
Copied from [place] [id number]. Topic: General - Other >> Feb 25, 2018 10:43 AM Lennox Solders wrote: Reason for CRM:pt had a bad fall she fell on cement on 7-21 and saw dr yates orthopedic on 12-08-17 for shoulder pain/hip and knee and she  did not have any broken bones and  had xrays . Pt was using regular lidocaine Dr Lorin [name] only give her aspercream with 4 % lidocaine. Pt would like to know if dr Maudie Mercury will give her something for inflammation. Pt said dr Lorin [name] will not give her anything for inflammation.  Pt is doing exercise for her shoulder . Pt is not sleeping well due to shoulders aching. Pt was using ice pack and now the pt is using bed buddy w/beads with heat on shoulders. [employer] [address]

## 2018-02-25 NOTE — Telephone Encounter (Signed)
Pt returned call and message given to her from Dr. Maudie Mercury. Pt is using the Aspercreme and she is afraid that she might be using too much. She is using twice a day.  She is also wondering about doing ultrasound on her shoulder. The bed buddy is helping some.

## 2018-02-28 NOTE — Telephone Encounter (Signed)
Please see prior message. Recommend appt with ortho or here to evaluated and assist. Thanks.

## 2018-02-28 NOTE — Telephone Encounter (Signed)
I called the pt and informed her of the message below.  Patient stated she will continue the Aspercreme, check and call back if needed.

## 2018-03-09 ENCOUNTER — Ambulatory Visit: Payer: Medicare Other | Admitting: Family Medicine

## 2018-03-09 ENCOUNTER — Encounter: Payer: Self-pay | Admitting: Family Medicine

## 2018-03-09 VITALS — BP 110/68 | HR 61 | Temp 98.6°F | Resp 16 | Ht 61.0 in | Wt 115.6 lb

## 2018-03-09 DIAGNOSIS — H6991 Unspecified Eustachian tube disorder, right ear: Secondary | ICD-10-CM

## 2018-03-09 DIAGNOSIS — R51 Headache: Secondary | ICD-10-CM

## 2018-03-09 DIAGNOSIS — H6121 Impacted cerumen, right ear: Secondary | ICD-10-CM

## 2018-03-09 DIAGNOSIS — H9201 Otalgia, right ear: Secondary | ICD-10-CM

## 2018-03-09 DIAGNOSIS — J3089 Other allergic rhinitis: Secondary | ICD-10-CM

## 2018-03-09 DIAGNOSIS — R519 Headache, unspecified: Secondary | ICD-10-CM

## 2018-03-09 DIAGNOSIS — J069 Acute upper respiratory infection, unspecified: Secondary | ICD-10-CM | POA: Diagnosis not present

## 2018-03-09 DIAGNOSIS — H6981 Other specified disorders of Eustachian tube, right ear: Secondary | ICD-10-CM

## 2018-03-09 LAB — POC INFLUENZA A&B (BINAX/QUICKVUE)
INFLUENZA A, POC: NEGATIVE
INFLUENZA B, POC: NEGATIVE

## 2018-03-09 MED ORDER — FLUTICASONE PROPIONATE 50 MCG/ACT NA SUSP: 1.0000 | Freq: Two times a day (BID) | NASAL | 3 refills | Status: DC

## 2018-03-09 MED ORDER — AZELASTINE HCL 0.1 % NA SOLN: 1.0000 | Freq: Two times a day (BID) | NASAL | 1 refills | Status: DC

## 2018-03-09 NOTE — Progress Notes (Signed)
ACUTE VISIT  HPI:  Chief Complaint  Patient presents with  . Sinusitis    [name] is a 84 y.o.female here today complaining of frontal pressure headache and facial pressure pain, worse on right side. Problem started yesterday and was worse through the night, interfered with her sleep. Today she is feeling better. She denies associated visual changes,vomiting or neurologic focal deficit.  Nausea and a loose stool today after eating lunch in a local restaurant, [employer]. She thinks it is related to indigestion. Reporting Hx of "terrible allergies."  She has been using Flonase nasal spray, which seems to be helping.  She is not sure about fever but felt " a little warm" last night with "alittle" bit of chills.  Right earache and "stopped up", intermittently. She wonders if it is caused wax.  She is not sure about body aches because still having pain from fall a few weeks ago, landed on left side, she followed with ortho and states that imaging was done.   HPI   No Hx of recent travel. Daughter was sick recently with similar symptoms. No known insect bite.  No urinary symptoms.  Review of Systems  Constitutional: Positive for chills and fatigue. Negative for activity change and appetite change.  HENT: Positive for congestion, ear pain, hearing loss, postnasal drip, rhinorrhea and sinus pressure. Negative for ear discharge, facial swelling, mouth sores, sore throat, trouble swallowing and voice change.   Eyes: Negative for discharge, redness and itching.  Respiratory: Negative for cough, shortness of breath and wheezing.   Gastrointestinal: Positive for diarrhea and nausea. Negative for abdominal pain and vomiting.  Genitourinary: Negative for decreased urine volume and dysuria.  Musculoskeletal: Positive for arthralgias and gait problem.  Skin: Negative for rash.  Allergic/Immunologic: Positive for environmental allergies.  Neurological: Positive for  headaches. Negative for syncope and weakness.  Psychiatric/Behavioral: The patient is nervous/anxious.       Current Outpatient Medications on File Prior to Visit  Medication Sig Dispense Refill  . B Complex-C (B-COMPLEX WITH VITAMIN C) tablet Take 1 tablet by mouth daily.    . cholecalciferol (VITAMIN D) 1000 UNITS tablet Take 1,000 Units by mouth daily.     [name] estradiol (ESTRACE) 0.5 MG tablet Take 0.5 mg by mouth daily.    [name] FLUOCINOLONE ACETONIDE OT Place in ear(s).    [name] OVER THE COUNTER MEDICATION Take 1 capsule by mouth 2 (two) times daily. [place]7 daily.    [name] OVER THE COUNTER MEDICATION 2 (two) times daily. Preservision vitamin for eyes    . Polyethyl Glycol-Propyl Glycol (SYSTANE OP) Apply to eye daily.    [name] Propylene Glycol (SYSTANE BALANCE) 0.6 % SOLN Apply to eye.    . vitamin E 400 UNIT capsule Take 1 capsule by mouth daily.     No current facility-administered medications on file prior to visit.      Past Medical History:  Diagnosis Date  . AC (acromioclavicular) joint bone spurs    on right shoulder   . Allergic rhinitis   . Arthritis   . Bursitis    right shoulder  . Complication of anesthesia    headaches  . Diverticulosis   . DIVERTICULOSIS, COLON 10/15/2006   Qualifier: Diagnosis of  By: Linna Darner MD, Gwyndolyn Saxon Eye problem    treated by Dr Kathlen Mody per patient  . Headache(784.0)    sinus HA  . History of blood transfusion 95yrs ago  no abnormal reaction noted  . History of cystitis   . History of skin cancer    Basal cell, Dr. Lindwood Coke  . Hyperlipidemia    F[place]ingham Study LDL goal =<160   . Hypotension   . Joint pain   . Joint swelling   . Macular degeneration, dry   . Osteopenia    Intolerance to Calcium,Actonel,Fosamax,Evista, Forteo: S/P Boniva 2007 to 03/2010  . OSTEOPENIA 03/12/2008   Qualifier: Diagnosis of  By: Velora Heckler    . Osteoporosis   . PONV (postoperative nausea and vomiting)   . Rosacea 04/22/2009    Qualifier: Diagnosis of  By: Linna Darner MD, Gwyndolyn Saxon    . Skin cancer 06/22/2017   per patient after excision right calf by Dr Ubaldo Glassing  . SKIN CANCER, HX OF 03/12/2008   Qualifier: Diagnosis of  By: Velora Heckler    . Urinary frequency   . Vaginal atrophy    sees Dr. Gertie Fey   Allergies  Allergen Reactions  . Alendronate Sodium Nausea And Vomiting  . Anaplex Hd Nausea And Vomiting  . Aspirin Nausea And Vomiting  . Azithromycin Nausea And Vomiting  . Calcium Nausea And Vomiting  . Codeine Nausea And Vomiting  . Doxycycline Nausea And Vomiting  . Hydrocodone Nausea And Vomiting  . Ibandronate Sodium     REACTION: severe body aches  . Ibuprofen Nausea And Vomiting  . Penicillins Nausea And Vomiting  . Raloxifene Nausea And Vomiting  . Risedronate Sodium Nausea And Vomiting  . Sulfonamide Derivatives Nausea And Vomiting  . Teriparatide Nausea And Vomiting    Social History   Socioeconomic History  . Marital status: Widowed    Spouse name: Not on file  . Number of children: 3  . Years of education: Not on file  . Highest education level: Some college, no degree  Occupational History    Employer: RETIRED  Social Needs  . Financial resource strain: Not on file  . Food insecurity:    Worry: Not on file    Inability: Not on file  . Transportation needs:    Medical: Not on file    Non-medical: Not on file  Tobacco Use  . Smoking status: Never Smoker  . Smokeless tobacco: Never Used  Substance and Sexual Activity  . Alcohol use: No  . Drug use: No  . Sexual activity: Not on file  Lifestyle  . Physical activity:    Days per week: Not on file    Minutes per session: Not on file  . Stress: Not on file  Relationships  . Social connections:    Talks on phone: Not on file    Gets together: Not on file    Attends religious service: Not on file    Active member of club or organization: Not on file    Attends meetings of clubs or organizations: Not on file    Relationship  status: Not on file  Other Topics Concern  . 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 She lives alone. She goes to the gym and participates in aerobics 3 x week. She drinks 3 cups of coffee a day.          Vitals:   03/09/18 1444  BP: 110/68  Pulse: 61  Resp: 16  Temp: 98.6 F (37 C)  SpO2: 96%   Body mass index is 21.49 kg/m.      Physical Exam  Nursing note and vitals reviewed. Constitutional: She is oriented to person, place, and time. She appears well-developed. She does not appear ill. No distress.  HENT:  Head: Normocephalic and atraumatic.  Right Ear: Tympanic membrane, external ear and ear canal normal. Decreased hearing is noted.  Left Ear: External ear normal. Tympanic membrane is not erythematous.  Nose: Rhinorrhea present. Right sinus exhibits no maxillary sinus tenderness and no frontal sinus tenderness. Left sinus exhibits no maxillary sinus tenderness and no frontal sinus tenderness.  Mouth/Throat: Oropharynx is clear and moist and mucous membranes are normal.  Normal sinus transillumination. Bilateral cerumen excess, TM partially seen. Nasal voice. Postnasal drainage. Normal sinus transillumination and mild pressure upon maxillary sinus percussion.  Eyes: Conjunctivae are normal.  Neck: No muscular tenderness present. No edema and no erythema present.  Cardiovascular: Normal rate and regular rhythm.  No murmur heard. Respiratory: Effort normal and breath sounds normal. No stridor. No respiratory  distress.  Lymphadenopathy:       Head (right side): No submandibular adenopathy present.       Head (left side): No submandibular adenopathy present.    She has no cervical adenopathy.  Neurological: She is alert and oriented to person, place, and time. She has normal strength.  Unstable gait with no assistance.  Skin: Skin is warm. No rash noted. No erythema.  Psychiatric: Her mood appears anxious.  Well groomed, good eye contact.      ASSESSMENT AND PLAN:   Ms. Bridgitte was seen today for sinusitis.  Diagnoses and all orders for this visit:  URI, acute  Rapid flu test here in the office negative. Symptoms suggests a viral etiology. Monitor for new symptoms. Instructed to monitor for signs of complications, including new onset of fever among some, clearly instructed about warning signs.  F/U as needed.  -     POC Influenza A&B(BINAX/QUICKVUE)  Headache, unspecified headache type Severe yesterday and better today. Possible etiologies discussed. Most likely associated to acute illness + sinus nasal congestion.   I do not think she has a bacterial sinusitis at this time, so abx is not needed at this time. We discussed some side effects of abx in general. She has several abx allergies.   09/13/17 she had sinus CT due to 2 months of headache: No active sinusitis or sinus outflow obstruction. Clearly instructed about warning signs.  -     POC Influenza A&B(BINAX/QUICKVUE)  Seasonal allergic rhinitis due to other allergic trigger Recommend using both nasal sprays daily for 2-3 weeks then continue prn. Saline nasal irrigations as needed.  -     azelastine (ASTELIN) 0.1 % nasal spray; Place 1 spray into both nostrils 2 (two) times daily. Use in each nostril as directed -     fluticasone (FLONASE) 50 MCG/ACT nasal spray; Place 1 spray into both nostrils 2 (two) times daily.  Impacted cerumen of right ear She requested ear lavage, discussed risk of procedure.  Earache on  right Improved after ear lavage and hearing improved but still feels  fullness sensation. Right TM non erythematous.  Dysfunction of right eustachian tube Because risk of side effects I do not recommend decongestants. Autoinflation maneuvers a few times during the day may help as well as intranasal steroids.      Yaret Hush G. [place], MD  [facility]. [place] office.

## 2018-03-09 NOTE — Patient Instructions (Addendum)
  [name] I have seen you today for an acute visit.  A few things to remember from today's visit:   Headache, unspecified headache type  Seasonal allergic rhinitis due to other allergic trigger - Plan: azelastine (ASTELIN) 0.1 % nasal spray, fluticasone (FLONASE) 50 MCG/ACT nasal spray  Nasal sinus congestion  Impacted cerumen of right ear  Earache on right  Dysfunction of right eustachian tube  URI, acute   If medications prescribed today, they will not be refill upon request, a follow up appointment with PCP will be necessary to discuss continuation of of treatment if appropriate.   Nasal irrigations with saline several times per day and as needed. Flonase and Astelin nasal sprays. Pop your ears a few times during the day, this will help with ear fullness sensation.  I do not recommend decongestants.  In general please monitor for signs of worsening symptoms and seek immediate medical attention if any concerning.  If symptoms are not resolved in 1-2 weeks you should schedule a follow up appointment with your doctor, before if needed.  I hope you get better soon!

## 2018-03-29 ENCOUNTER — Ambulatory Visit: Payer: Medicare Other

## 2018-04-05 ENCOUNTER — Ambulatory Visit (INDEPENDENT_AMBULATORY_CARE_PROVIDER_SITE_OTHER): Payer: Medicare Other

## 2018-04-05 VITALS — BP 110/60 | HR 72 | Temp 98.0°F | Ht 61.0 in | Wt 113.6 lb

## 2018-04-05 DIAGNOSIS — Z0000 Encounter for general adult medical examination without abnormal findings: Secondary | ICD-10-CM | POA: Diagnosis not present

## 2018-04-05 NOTE — Progress Notes (Signed)
Subjective:   Heidi Moore is a 84 y.o. female who presents for Medicare Annual (Subsequent) preventive examination.  Reports health as fair  "states this has been a bad year Seen dr. Maudie Mercury 10/05/2017   Pressure is up in eye this year and went on glaucoma med   Same week had dental issue with gums and had to have root canal done by Dr. Modena Nunnery   Dr. [place] put her on Flonase  Eventually had CT and was referred to neurology and tried some PT and states the dizziness got better  Follows with medical schools ([employer]) for medical information Her Plan: taking flonase at hs Uses saline in the am and vicks at hs  Advised to stay inside and not do things that may aggravate them    [facility] July 21st on concrete; her left hip and left shoulder  and dtr took her to Dr. Lorin [name]  States she is s/p left knee replacement-  Waited 2 weeks to see the doctor  Uses walker   Memory is sharp as last year    Lives in home since 66;  One level home  Not going to an independent living  Does all her house work Driving and no issues Exercised all your life   Dtr and grand dtr live here ( they live very close to her) Son in [place] One child in [place] to go to [place] for Christmas   2018 Diet  Does not cook much Microwaves Oatmeal and fruit for breakfast; BB; Rasberries (stays current on what one should eat) Yogurt and almonds 1/3 bagel with peanut butter  Lunch; Goes to [facility] and get chicken and vegetable Fixes baked chicken; homemade chili This year, trying to eat more protein  Drinking a shake to add protein to her diet this year   4 cups of coffee and water with diet cranberry juice   BMI 22   Exercise States her quad on the left is so much weaker  Working on quad exercises per Dr. Lorin [name]  Can do 20 squats x 2 a day Does electric steps as well  Pushes board with weights  Her balance is great   Working out 3 days a week with sliver sneaker Blowing leaves now  Declines a  yard service    Dental - had crowns put in  [place] cleans her teeth Had to have a root canal Was on antibiotic Valacyclovir 500mg  but now finished with this  There are no preventive care reminders to display for this patient.  There are no preventive care reminders to display for this patient. Had flu vaccine in sept with her dtr  Bone density 05/2010; -2.1 -  (just fell on concrete in July with no fx)  Dr. Gaetano Net did Bone density; told her it was better  Spine was fine  Mammogram 01/2012 stopped these   Last year, discussed [place] lisa treatment for vaginal dryness        Objective:     Vitals: BP 110/60   Pulse 72   Temp 98 F (36.7 C)   Ht 5\' 1"  (1.549 m)   Wt 113 lb 9 oz (51.5 kg)   SpO2 97%   BMI 21.46 kg/m   Body mass index is 21.46 kg/m.  Advanced Directives 04/05/2018 03/25/2017 11/06/2014 10/30/2013 09/12/2013 09/05/2013 05/09/2012  Does Patient Have a Medical Advance Directive? Yes Yes Yes Patient has advance directive, copy not in chart Patient has advance directive, copy not in  chart Patient has advance directive, copy not in chart Patient has advance directive, copy not in chart  Type of Advance Directive - - [place];Living will Healthcare Power of Attorney Living will Living will -  Does patient want to make changes to medical advance directive? - - No - Patient declined - - - -  Copy of [place] in Chart? - - Yes Copy requested from family Copy requested from family Copy requested from family -  Pre-existing out of facility DNR order (yellow form or pink MOST form) - - - - No No -    Tobacco Social History   Tobacco Use  Smoking Status Never Smoker  Smokeless Tobacco Never Used     Counseling given: Yes   Clinical Intake:     Past Medical History:  Diagnosis Date  . AC (acromioclavicular) joint bone spurs    on right shoulder   . Allergic rhinitis   . Arthritis   . Bursitis    right shoulder  .  Complication of anesthesia    headaches  . Diverticulosis   . DIVERTICULOSIS, COLON 10/15/2006   Qualifier: Diagnosis of  By: Linna Darner MD, Gwyndolyn Saxon Eye problem    treated by Dr Kathlen Mody per patient  . Headache(784.0)    sinus HA  . History of blood transfusion 50yrs ago   no abnormal reaction noted  . History of cystitis   . History of skin cancer    Basal cell, Dr. Lindwood Coke  . Hyperlipidemia    [place] Study LDL goal =<160   . Hypotension   . Joint pain   . Joint swelling   . Macular degeneration, dry   . Osteopenia    Intolerance to Calcium,Actonel,Fosamax,Evista, Forteo: S/P Boniva 2007 to 03/2010  . OSTEOPENIA 03/12/2008   Qualifier: Diagnosis of  By: Velora Heckler    . Osteoporosis   . PONV (postoperative nausea and vomiting)   . Rosacea 04/22/2009   Qualifier: Diagnosis of  By: Linna Darner MD, Gwyndolyn Saxon    . Skin cancer 06/22/2017   per patient after excision right calf by Dr Ubaldo Glassing  . SKIN CANCER, HX OF 03/12/2008   Qualifier: Diagnosis of  By: Velora Heckler    . Urinary frequency   . Vaginal atrophy    sees Dr. Gertie Fey   Past Surgical History:  Procedure Laterality Date  . APPENDECTOMY    . BUNIONECTOMY Bilateral   . CATARACT EXTRACTION, BILATERAL    . COLONOSCOPY     with Tics (no further f/u as per GI due to age)   . KNEE ARTHROPLASTY  05/18/2012   Procedure: COMPUTER ASSISTED TOTAL KNEE ARTHROPLASTY;  Surgeon: Marybelle Killings, MD;  Location: [facility];  Service: Orthopedics;  Laterality: Left;  Left Total Knee Arthroplasty, Cemented, Computer Assisted  . lt knee      meniscus tear  . MOHS SURGERY     right nostril for basel cell cancer   . NOSE SURGERY     due to trama at age 3   . TOTAL ABDOMINAL HYSTERECTOMY W/ BILATERAL SALPINGOOPHORECTOMY     for Endometriosis   . TOTAL SHOULDER ARTHROPLASTY Right 09/11/2013   Procedure: TOTAL SHOULDER ARTHROPLASTY;  Surgeon: Marybelle Killings, MD;  Location: [facility];  Service: Orthopedics;  Laterality: Right;  Right Total Shoulder  Arthroplasty   Family History  Problem Relation Age of Onset  . Colon cancer Other        1st Cousin   .  Stroke Mother 6  . Diabetes Brother   . Atrial fibrillation Brother        coumadin  . Lung cancer Sister    Social History   Socioeconomic History  . Marital status: Widowed    Spouse name: Not on file  . Number of children: 3  . Years of education: Not on file  . Highest education level: Some college, no degree  Occupational History    Employer: RETIRED  Social Needs  . Financial resource strain: Not on file  . Food insecurity:    Worry: Not on file    Inability: Not on file  . Transportation needs:    Medical: Not on file    Non-medical: Not on file  Tobacco Use  . Smoking status: Never Smoker  . Smokeless tobacco: Never Used  Substance and Sexual Activity  . Alcohol use: No  . Drug use: No  . Sexual activity: Not on file  Lifestyle  . Physical activity:    Days per week: Not on file    Minutes per session: Not on file  . Stress: Not on file  Relationships  . Social connections:    Talks on phone: Not on file    Gets together: Not on file    Attends religious service: Not on file    Active member of club or organization: Not on file    Attends meetings of clubs or organizations: Not on file    Relationship status: Not on file  Other Topics Concern  . 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 She lives alone. She goes to the gym and participates in aerobics 3 x week. She drinks 3 cups of coffee a  day.          Outpatient Encounter Medications as of 04/05/2018  Medication Sig  . B Complex-C (B-COMPLEX WITH VITAMIN C) tablet Take 1 tablet by mouth daily.  . cholecalciferol (VITAMIN D) 1000 UNITS tablet Take 1,000 Units by mouth daily.   [name] estradiol (ESTRACE) 0.5 MG tablet Take 0.5 mg by mouth daily.  [name] FLUOCINOLONE ACETONIDE OT Place in ear(s).  . fluticasone (FLONASE) 50 MCG/ACT nasal spray Place 1 spray into both nostrils 2 (two) times daily.  [name] OVER THE COUNTER MEDICATION Take 1 capsule by mouth 2 (two) times daily. [place]7 daily.  [name] OVER THE COUNTER MEDICATION 2 (two) times daily. Preservision vitamin for eyes  . Polyethyl Glycol-Propyl Glycol (SYSTANE OP) Apply to eye daily.  [name] Propylene Glycol (SYSTANE BALANCE) 0.6 % SOLN Apply to eye.  . vitamin E 400 UNIT capsule Take 1 capsule by mouth daily.  [name] azelastine (ASTELIN) 0.1 % nasal spray Place 1 spray into both nostrils 2 (two) times daily. Use in each nostril as directed (Patient not taking: Reported on 04/05/2018)   No facility-administered encounter medications on file as of 04/05/2018.     Activities of Daily Living In your present  state of health, do you have any difficulty performing the following activities: 04/05/2018  Hearing? N  Vision? N  Difficulty concentrating or making decisions? N  Walking or climbing stairs? Y  Dressing or bathing? N  Doing errands, shopping? N  Preparing Food and eating ? N  Using the Toilet? N  In the past six months, have you accidently leaked urine? N  Comment none changes noted in assessment; very good historian  Do you have problems with loss of bowel control? N  Managing your Medications? N  Comment can state all her meds and she watches the health new   Managing your Finances? N  Housekeeping or managing your Housekeeping? N  Some recent data might be hidden    Patient Care Team: Lucretia Kern, DO as PCP - General (Family Medicine) Rolm Bookbinder, MD as  Consulting Physician (Dermatology)    Assessment:   This is a routine wellness examination for Braydee.  Exercise Activities and Dietary recommendations Current Exercise Habits: Home exercise routine;Structured exercise class, Time (Minutes): 60, Frequency (Times/Week): 4(takes care of yard ), Weekly Exercise (Minutes/Week): 240, Intensity: Moderate  Goals    . Exercise 150 min/wk Moderate Activity     Exercising per Dr. Lorin [name] recommendation to strengthen her quads Continue with yard work       Fall Risk Fall Risk  04/05/2018 03/09/2018 10/05/2017 03/25/2017 11/07/2015  Falls in the past year? 1 Yes Yes No Yes  Comment - - - may of 2017; stepped off curb and road was uneven and she fell  -  Number falls in past yr: 0 1 1 - 1  Comment - 11/28/17 - - -  Injury with Fall? 1 - No - Yes  Risk for fall due to : Impaired balance/gait - - - -  Follow up Education provided - - - -     Depression Screen PHQ 2/9 Scores 04/05/2018 10/05/2017 03/25/2017 11/07/2015  PHQ - 2 Score 0 0 0 0     Cognitive Function MMSE - Mini Mental State Exam 03/25/2017  Not completed: (No Data)     Ad8 score reviewed for issues:  Issues making decisions:  Less interest in hobbies / activities:  Repeats questions, stories (family complaining):  Trouble using ordinary gadgets (microwave, computer, phone):  Forgets the month or year:   Mismanaging finances:   Remembering appts:  Daily problems with thinking and/or memory: Ad8 score is=0        Immunization History  Administered Date(s) Administered  . Influenza Split 02/13/2013  . Influenza Whole 01/31/2008  . Influenza, High Dose Seasonal PF 01/31/2016  . Influenza-Unspecified 01/09/2014, 01/24/2017, 02/09/2018  . Pneumococcal Polysaccharide-23 05/11/1996  . Td 05/11/1994  . Tdap 10/30/2013  . Zoster 11/04/2010    Screening Tests Health Maintenance  Topic Date Due  . TETANUS/TDAP  10/31/2023  . INFLUENZA VACCINE  Completed  . DEXA  SCAN  Completed  . PNA vac Low Risk Adult  Addressed        Plan:      PCP Notes   Health Maintenance 84 yo with no preventive health due Had flu vaccine Discussed shingrix   Abnormal Screens  Had fall on concrete July 21 and followed with orthopedic; No fx; c/o of left knee issues s/p knee replacement. Dr. Lorin [name] told her she still had quad weakness on the left and is now in her regular exercise, as well as building quad strength.  C/0 of allergies and sinus issues but declined apt with Dr.  Kim Diastolic was 50 at one check but corrected to 60  Had to have dental work recently which is not helping sinus. She still has some pressure in her ear but continues to blow leaves in her yard etc. Will make an apt with Dr. Maudie Mercury if she is not feeling better.  Referrals  none  Patient concerns; Planning a trip to [facility] March 24  Didn't get to go to [place] in August   Nurse Concerns; Aged but not frail; continues to fight to remain independent. Treats herself but is still taking her flonase, came off clariten. Educated how both worked but felt she only needs the flonase   Next PCP apt To schedule an apt with Dr. Maudie Mercury early May   Will schedule an apt if her sinus issues linger     I have personally reviewed and noted the following in the patient's chart:   . Medical and social history . Use of alcohol, tobacco or illicit drugs  . Current medications and supplements . Functional ability and status . Nutritional status . Physical activity . Advanced directives . List of other physicians . Hospitalizations, surgeries, and ER visits in previous 12 months . Vitals . Screenings to include cognitive, depression, and falls . Referrals and appointments  In addition, I have reviewed and discussed with patient certain preventive protocols, quality metrics, and best practice recommendations. A written personalized care plan for preventive services as well as general  preventive health recommendations were provided to patient.     Wynetta Fines, RN  04/05/2018

## 2018-04-05 NOTE — Patient Instructions (Addendum)
Heidi Moore , Thank you for taking time to come for your Medicare Wellness Visit. I appreciate your ongoing commitment to your health goals. Please review the following plan we discussed and let me know if I can assist you in the future.   Keep taking your protein drink and dry the glycerna  It is great you are back in your exercise class!   Shingrix is a vaccine for the prevention of Shingles in Adults 50 and older.  If you are on Medicare, the shingrix is covered under your Part D plan, so you will take both of the vaccines in the series at your pharmacy. Please check with your benefits regarding applicable copays or out of pocket expenses.  The Shingrix is given in 2 vaccines approx 8 weeks apart. You must receive the 2nd dose prior to 6 months from receipt of the first. Please have the pharmacist print out you Immunization  dates for our office records    These are the goals we discussed: Goals    . Exercise 150 min/wk Moderate Activity     Exercising per Dr. Lorin [name] recommendation to strengthen her quads Continue with yard work       This is a list of the screening recommended for you and due dates:  Health Maintenance  Topic Date Due  . Tetanus Vaccine  10/31/2023  . Flu Shot  Completed  . DEXA scan (bone density measurement)  Completed  . Pneumonia vaccines  Addressed      Fall Prevention in the Home Falls can cause injuries. They can happen to people of all ages. There are many things you can do to make your home safe and to help prevent falls. What can I do on the outside of my home?  Regularly fix the edges of walkways and driveways and fix any cracks.  Remove anything that might make you trip as you walk through a door, such as a raised step or threshold.  Trim any bushes or trees on the path to your home.  Use bright outdoor lighting.  Clear any walking paths of anything that might make someone trip, such as rocks or tools.  Regularly check to see if handrails  are loose or broken. Make sure that both sides of any steps have handrails.  Any raised decks and porches should have guardrails on the edges.  Have any leaves, snow, or ice cleared regularly.  Use sand or salt on walking paths during winter.  Clean up any spills in your garage right away. This includes oil or grease spills. What can I do in the bathroom?  Use night lights.  Install grab bars by the toilet and in the tub and shower. Do not use towel bars as grab bars.  Use non-skid mats or decals in the tub or shower.  If you need to sit down in the shower, use a plastic, non-slip stool.  Keep the floor dry. Clean up any water that spills on the floor as soon as it happens.  Remove soap buildup in the tub or shower regularly.  Attach bath mats securely with double-sided non-slip rug tape.  Do not have throw rugs and other things on the floor that can make you trip. What can I do in the bedroom?  Use night lights.  Make sure that you have a light by your bed that is easy to reach.  Do not use any sheets or blankets that are too big for your bed. They should not hang down  onto the floor.  Have a firm chair that has side arms. You can use this for support while you get dressed.  Do not have throw rugs and other things on the floor that can make you trip. What can I do in the kitchen?  Clean up any spills right away.  Avoid walking on wet floors.  Keep items that you use a lot in easy-to-reach places.  If you need to reach something above you, use a strong step stool that has a grab bar.  Keep electrical cords out of the way.  Do not use floor polish or wax that makes floors slippery. If you must use wax, use non-skid floor wax.  Do not have throw rugs and other things on the floor that can make you trip. What can I do with my stairs?  Do not leave any items on the stairs.  Make sure that there are handrails on both sides of the stairs and use them. Fix handrails  that are broken or loose. Make sure that handrails are as long as the stairways.  Check any carpeting to make sure that it is firmly attached to the stairs. Fix any carpet that is loose or worn.  Avoid having throw rugs at the top or bottom of the stairs. If you do have throw rugs, attach them to the floor with carpet tape.  Make sure that you have a light switch at the top of the stairs and the bottom of the stairs. If you do not have them, ask someone to add them for you. What else can I do to help prevent falls?  Wear shoes that: ? Do not have high heels. ? Have rubber bottoms. ? Are comfortable and fit you well. ? Are closed at the toe. Do not wear sandals.  If you use a stepladder: ? Make sure that it is fully opened. Do not climb a closed stepladder. ? Make sure that both sides of the stepladder are locked into place. ? Ask someone to hold it for you, if possible.  Clearly mark and make sure that you can see: ? Any grab bars or handrails. ? First and last steps. ? Where the edge of each step is.  Use tools that help you move around (mobility aids) if they are needed. These include: ? Canes. ? Walkers. ? Scooters. ? Crutches.  Turn on the lights when you go into a dark area. Replace any light bulbs as soon as they burn out.  Set up your furniture so you have a clear path. Avoid moving your furniture around.  If any of your floors are uneven, fix them.  If there are any pets around you, be aware of where they are.  Review your medicines with your doctor. Some medicines can make you feel dizzy. This can increase your chance of falling. Ask your doctor what other things that you can do to help prevent falls. This information is not intended to replace advice given to you by your health care provider. Make sure you discuss any questions you have with your health care provider. Document Released: 02/21/2009 Document Revised: 10/03/2015 Document Reviewed: 06/01/2014 Elsevier  Interactive Patient Education  2018 [place] Maintenance, Female Adopting a healthy lifestyle and getting preventive care can go a long way to promote health and wellness. Talk with your health care provider about what schedule of regular examinations is right for you. This is a good chance for you to check in with your provider  about disease prevention and staying healthy. In between checkups, there are plenty of things you can do on your own. Experts have done a lot of research about which lifestyle changes and preventive measures are most likely to keep you healthy. Ask your health care provider for more information. Weight and diet Eat a healthy diet  Be sure to include plenty of vegetables, fruits, low-fat dairy products, and lean protein.  Do not eat a lot of foods high in solid fats, added sugars, or salt.  Get regular exercise. This is one of the most important things you can do for your health. ? Most adults should exercise for at least 150 minutes each week. The exercise should increase your heart rate and make you sweat (moderate-intensity exercise). ? Most adults should also do strengthening exercises at least twice a week. This is in addition to the moderate-intensity exercise.  Maintain a healthy weight  Body mass index (BMI) is a measurement that can be used to identify possible weight problems. It estimates body fat based on height and weight. Your health care provider can help determine your BMI and help you achieve or maintain a healthy weight.  For females 84 years of age and older: ? A BMI below 18.5 is considered underweight. ? A BMI of 18.5 to 24.9 is normal. ? A BMI of 25 to 29.9 is considered overweight. ? A BMI of 30 and above is considered obese.  Watch levels of cholesterol and blood lipids  You should start having your blood tested for lipids and cholesterol at 82 years of age, then have this test every 5 years.  You may need to have your  cholesterol levels checked more often if: ? Your lipid or cholesterol levels are high. ? You are older than 82 years of age. ? You are at high risk for heart disease.  Cancer screening Lung Cancer  Lung cancer screening is recommended for adults 63-89 years old who are at high risk for lung cancer because of a history of smoking.  A yearly low-dose CT scan of the lungs is recommended for people who: ? Currently smoke. ? Have quit within the past 15 years. ? Have at least a 30-pack-year history of smoking. A pack year is smoking an average of one pack of cigarettes a day for 1 year.  Yearly screening should continue until it has been 15 years since you quit.  Yearly screening should stop if you develop a health problem that would prevent you from having lung cancer treatment.  Breast Cancer  Practice breast self-awareness. This means understanding how your breasts normally appear and feel.  It also means doing regular breast self-exams. Let your health care provider know about any changes, no matter how small.  If you are in your 20s or 30s, you should have a clinical breast exam (CBE) by a health care provider every 1-3 years as part of a regular health exam.  If you are 47 or older, have a CBE every year. Also consider having a breast X-ray (mammogram) every year.  If you have a family history of breast cancer, talk to your health care provider about genetic screening.  If you are at high risk for breast cancer, talk to your health care provider about having an MRI and a mammogram every year.  Breast cancer gene (BRCA) assessment is recommended for women who have family members with BRCA-related cancers. BRCA-related cancers include: ? Breast. ? Ovarian. ? Tubal. ? Peritoneal cancers.  Results of  the assessment will determine the need for genetic counseling and BRCA1 and BRCA2 testing.  Cervical Cancer Your health care provider may recommend that you be screened regularly  for cancer of the pelvic organs (ovaries, uterus, and vagina). This screening involves a pelvic examination, including checking for microscopic changes to the surface of your cervix (Pap test). You may be encouraged to have this screening done every 3 years, beginning at age 37.  For women ages 20-65, health care providers may recommend pelvic exams and Pap testing every 3 years, or they may recommend the Pap and pelvic exam, combined with testing for human papilloma virus (HPV), every 5 years. Some types of HPV increase your risk of cervical cancer. Testing for HPV may also be done on women of any age with unclear Pap test results.  Other health care providers may not recommend any screening for nonpregnant women who are considered low risk for pelvic cancer and who do not have symptoms. Ask your health care provider if a screening pelvic exam is right for you.  If you have had past treatment for cervical cancer or a condition that could lead to cancer, you need Pap tests and screening for cancer for at least 20 years after your treatment. If Pap tests have been discontinued, your risk factors (such as having a new sexual partner) need to be reassessed to determine if screening should resume. Some women have medical problems that increase the chance of getting cervical cancer. In these cases, your health care provider may recommend more frequent screening and Pap tests.  Colorectal Cancer  This type of cancer can be detected and often prevented.  Routine colorectal cancer screening usually begins at 82 years of age and continues through 82 years of age.  Your health care provider may recommend screening at an earlier age if you have risk factors for colon cancer.  Your health care provider may also recommend using home test kits to check for hidden blood in the stool.  A small camera at the end of a tube can be used to examine your colon directly (sigmoidoscopy or colonoscopy). This is done to  check for the earliest forms of colorectal cancer.  Routine screening usually begins at age 53.  Direct examination of the colon should be repeated every 5-10 years through 82 years of age. However, you may need to be screened more often if early forms of precancerous polyps or small growths are found.  Skin Cancer  Check your skin from head to toe regularly.  Tell your health care provider about any new moles or changes in moles, especially if there is a change in a mole's shape or color.  Also tell your health care provider if you have a mole that is larger than the size of a pencil eraser.  Always use sunscreen. Apply sunscreen liberally and repeatedly throughout the day.  Protect yourself by wearing long sleeves, pants, a wide-brimmed hat, and sunglasses whenever you are outside.  Heart disease, diabetes, and high blood pressure  High blood pressure causes heart disease and increases the risk of stroke. High blood pressure is more likely to develop in: ? People who have blood pressure in the high end of the normal range (130-139/85-89 mm Hg). ? People who are overweight or obese. ? People who are African American.  If you are 66-76 years of age, have your blood pressure checked every 3-5 years. If you are 76 years of age or older, have your blood pressure checked every  year. You should have your blood pressure measured twice-once when you are at a hospital or clinic, and once when you are not at a hospital or clinic. Record the average of the two measurements. To check your blood pressure when you are not at a hospital or clinic, you can use: ? An automated blood pressure machine at a pharmacy. ? A home blood pressure monitor.  If you are between 74 years and 66 years old, ask your health care provider if you should take aspirin to prevent strokes.  Have regular diabetes screenings. This involves taking a blood sample to check your fasting blood sugar level. ? If you are at a  normal weight and have a low risk for diabetes, have this test once every three years after 82 years of age. ? If you are overweight and have a high risk for diabetes, consider being tested at a younger age or more often. Preventing infection Hepatitis B  If you have a higher risk for hepatitis B, you should be screened for this virus. You are considered at high risk for hepatitis B if: ? You were born in a country where hepatitis B is common. Ask your health care provider which countries are considered high risk. ? Your parents were born in a high-risk country, and you have not been immunized against hepatitis B (hepatitis B vaccine). ? You have HIV or AIDS. ? You use needles to inject street drugs. ? You live with someone who has hepatitis B. ? You have had sex with someone who has hepatitis B. ? You get hemodialysis treatment. ? You take certain medicines for conditions, including cancer, organ transplantation, and autoimmune conditions.  Hepatitis C  Blood testing is recommended for: ? Everyone born from [birth year] through 1963. ? Anyone with known risk factors for hepatitis C.  Sexually transmitted infections (STIs)  You should be screened for sexually transmitted infections (STIs) including gonorrhea and chlamydia if: ? You are sexually active and are younger than 82 years of age. ? You are older than 82 years of age and your health care provider tells you that you are at risk for this type of infection. ? Your sexual activity has changed since you were last screened and you are at an increased risk for chlamydia or gonorrhea. Ask your health care provider if you are at risk.  If you do not have HIV, but are at risk, it may be recommended that you take a prescription medicine daily to prevent HIV infection. This is called pre-exposure prophylaxis (PrEP). You are considered at risk if: ? You are sexually active and do not regularly use condoms or know the HIV status of your  partner(s). ? You take drugs by injection. ? You are sexually active with a partner who has HIV.  Talk with your health care provider about whether you are at high risk of being infected with HIV. If you choose to begin PrEP, you should first be tested for HIV. You should then be tested every 3 months for as long as you are taking PrEP. Pregnancy  If you are premenopausal and you may become pregnant, ask your health care provider about preconception counseling.  If you may become pregnant, take 400 to 800 micrograms (mcg) of folic acid every day.  If you want to prevent pregnancy, talk to your health care provider about birth control (contraception). Osteoporosis and menopause  Osteoporosis is a disease in which the bones lose minerals and strength with aging. This can result  in serious bone fractures. Your risk for osteoporosis can be identified using a bone density scan.  If you are 35 years of age or older, or if you are at risk for osteoporosis and fractures, ask your health care provider if you should be screened.  Ask your health care provider whether you should take a calcium or vitamin D supplement to lower your risk for osteoporosis.  Menopause may have certain physical symptoms and risks.  Hormone replacement therapy may reduce some of these symptoms and risks. Talk to your health care provider about whether hormone replacement therapy is right for you. Follow these instructions at home:  Schedule regular health, dental, and eye exams.  Stay current with your immunizations.  Do not use any tobacco products including cigarettes, chewing tobacco, or electronic cigarettes.  If you are pregnant, do not drink alcohol.  If you are breastfeeding, limit how much and how often you drink alcohol.  Limit alcohol intake to no more than 1 drink per day for nonpregnant women. One drink equals 12 ounces of beer, 5 ounces of wine, or 1 ounces of hard liquor.  Do not use street  drugs.  Do not share needles.  Ask your health care provider for help if you need support or information about quitting drugs.  Tell your health care provider if you often feel depressed.  Tell your health care provider if you have ever been abused or do not feel safe at home. This information is not intended to replace advice given to you by your health care provider. Make sure you discuss any questions you have with your health care provider. Document Released: 11/10/2010 Document Revised: 10/03/2015 Document Reviewed: 01/29/2015 Elsevier Interactive Patient Education  [employer].

## 2018-04-05 NOTE — Progress Notes (Signed)
Eagle Pitta R Aengus Sauceda, DO  

## 2018-04-26 ENCOUNTER — Ambulatory Visit: Payer: Medicare Other

## 2018-06-23 ENCOUNTER — Encounter: Payer: Self-pay | Admitting: Family Medicine

## 2018-10-11 ENCOUNTER — Encounter: Payer: Medicare Other | Admitting: Family Medicine

## 2018-10-12 ENCOUNTER — Ambulatory Visit: Payer: Medicare Other | Admitting: Family Medicine

## 2018-10-20 ENCOUNTER — Telehealth: Payer: Self-pay | Admitting: Family Medicine

## 2018-10-20 NOTE — Telephone Encounter (Signed)
Pt would like to know if she can get a TOC and CPE on the same day it was r/s'd like that but I want to make sure that it is okay.

## 2018-10-21 NOTE — Telephone Encounter (Signed)
I called the pt and she stated she wants to have her labs done and prefers not to come in to the office due to [place].  I advised the pt the doctor will call her at home for the visit on 8/3 and if she needs labs, a separate appt can be made for this and she agreed.

## 2018-10-21 NOTE — Telephone Encounter (Signed)
Please just clarify what she is expecting with CPE? She did have AWV in fall with Manuela Schwartz. She does not have many current medical problems so if just wanting hands on exam (skin check, heart, lungs, etc) we can do that. She is pretty up to date with other preventative health measures.

## 2018-11-01 ENCOUNTER — Ambulatory Visit (INDEPENDENT_AMBULATORY_CARE_PROVIDER_SITE_OTHER): Payer: Medicare Other | Admitting: Family Medicine

## 2018-11-01 ENCOUNTER — Other Ambulatory Visit: Payer: Self-pay

## 2018-11-01 ENCOUNTER — Encounter: Payer: Self-pay | Admitting: Family Medicine

## 2018-11-01 VITALS — BP 120/60 | HR 78 | Temp 97.6°F | Wt 112.4 lb

## 2018-11-01 DIAGNOSIS — R42 Dizziness and giddiness: Secondary | ICD-10-CM | POA: Diagnosis not present

## 2018-11-01 NOTE — Progress Notes (Signed)
   Subjective:    Patient ID: Heidi Moore, female    DOB: 1919/11/16, 85 y.o.   MRN: [id number]  HPI Here for intermittent dizziness. This started over a year ago and her workup has included a normal head CT. She saw Dr. Tomi Likens for a neurologic evaluation and she went to vestibular rehab. This was somewhat successful. She cannot take Meclizine due to sedation. She also gets sedated with Claritin or Zyrtec. She tried Flonase and Astelin nasal sprays but these did not help. She describes occasional loss of balance, but she has not fallen.    Review of Systems  Constitutional: Negative.   HENT: Negative.   Eyes: Negative.   Respiratory: Negative.   Cardiovascular: Negative.   Neurological: Positive for dizziness. Negative for light-headedness and headaches.       Objective:   Physical Exam Constitutional:      General: She is not in acute distress.    Appearance: Normal appearance.  HENT:     Head: Normocephalic and atraumatic.     Right Ear: Tympanic membrane, ear canal and external ear normal.     Left Ear: Tympanic membrane, ear canal and external ear normal.     Nose: Nose normal.     Mouth/Throat:     Pharynx: Oropharynx is clear.  Eyes:     Conjunctiva/sclera: Conjunctivae normal.  Cardiovascular:     Rate and Rhythm: Normal rate and regular rhythm.     Pulses: Normal pulses.     Heart sounds: Normal heart sounds.  Pulmonary:     Effort: Pulmonary effort is normal.     Breath sounds: Normal breath sounds.  Lymphadenopathy:     Cervical: No cervical adenopathy.  Neurological:     General: No focal deficit present.     Mental Status: She is alert and oriented to person, place, and time.           Assessment & Plan:  Vertigo, likely vestibular. I suggested she try taking Xyzal every morning since it is less sedating. Recheck prn. Alysia Penna, MD

## 2018-12-12 ENCOUNTER — Encounter: Payer: Self-pay | Admitting: Family Medicine

## 2018-12-12 ENCOUNTER — Ambulatory Visit (INDEPENDENT_AMBULATORY_CARE_PROVIDER_SITE_OTHER): Payer: Medicare Other | Admitting: Family Medicine

## 2018-12-12 ENCOUNTER — Other Ambulatory Visit: Payer: Self-pay

## 2018-12-12 DIAGNOSIS — J3089 Other allergic rhinitis: Secondary | ICD-10-CM | POA: Diagnosis not present

## 2018-12-12 DIAGNOSIS — R5383 Other fatigue: Secondary | ICD-10-CM

## 2018-12-12 DIAGNOSIS — M1712 Unilateral primary osteoarthritis, left knee: Secondary | ICD-10-CM | POA: Diagnosis not present

## 2018-12-12 DIAGNOSIS — E559 Vitamin D deficiency, unspecified: Secondary | ICD-10-CM

## 2018-12-12 DIAGNOSIS — E785 Hyperlipidemia, unspecified: Secondary | ICD-10-CM

## 2018-12-12 DIAGNOSIS — R531 Weakness: Secondary | ICD-10-CM

## 2018-12-12 DIAGNOSIS — H3530 Unspecified macular degeneration: Secondary | ICD-10-CM

## 2018-12-12 NOTE — Progress Notes (Signed)
Virtual Visit via Telephone (no video available)  I connected with Heidi Moore   on 12/12/18 at 11:00 AM EDT by telephone call and verified that I am speaking with the correct person using two identifiers.  Location patient: home Location provider:work office Persons participating in the virtual visit: patient, provider  I discussed the limitations of evaluation and management by telemedicine and the availability of in person appointments. The patient expressed understanding and agreed to proceed.   Heidi Moore DOB: 08/17/1919 Encounter date: 12/12/2018  This is a 85 y.o. female who presents to establish care. Chief Complaint  Patient presents with  . Establish Care    History of present illness: Has a little allergy problem - used to be seasonal; now year round. Was having some balance issues and then saw neurology after waiting for a couple of months. Given flonase and thinks that made her worse. Using just saline now. Dr. Sarajane Jews had suggested xyzal but feels that is still too strong for her. The imbalance is better now. Some mornings wakes up itching. Feels like she is reacting to things outdoor. Walks daily with daughter. Was doing machines, bikes at gym but had to stop due to [place].   Feeling more weak. Not sure if related to her not going to gym. Takes iron pill every once in awhile and get in more protein. Pretty good about getting 3 meals/day.   Knee gets sore sometimes - does exercises daily in bed. Follows with Dr. Lorin [name]. Neck bothers her sometime.   Saw Dr. Gertie Fey last week and did bone density. She states that spine was ok. Left hip osteopenia and right osteoporosis. States that weight was 114 at visit. Taking Mg at night; dropped off oral calcium last year after discussion with [facility]. Taking vitamin d regularly.   Has had difficulty with getting to sleep. Sleeping in later but still getting in 3 meals/day.   HL: diet controlled  No issues with urinating.   Following with  ophthalmology regularly for glaucoma, macular degeneration.   Hasn't been following with derm regularly; just if she finds a spot of concern.    Past Medical History:  Diagnosis Date  . AC (acromioclavicular) joint bone spurs    on right shoulder   . Allergic rhinitis   . Arthritis   . Bursitis    right shoulder  . Complication of anesthesia    headaches  . Diverticulosis   . DIVERTICULOSIS, COLON 10/15/2006   Qualifier: Diagnosis of  By: Linna Darner MD, Gwyndolyn Saxon Eye problem    treated by Dr Kathlen Mody per patient  . Headache(784.0)    sinus HA  . History of blood transfusion 38yrs ago   no abnormal reaction noted  . History of cystitis   . History of skin cancer    Basal cell, Dr. Lindwood Coke  . Hyperlipidemia    [place] Study LDL goal =<160   . Hypotension   . Joint pain   . Joint swelling   . Macular degeneration, dry   . Osteopenia    Intolerance to Calcium,Actonel,Fosamax,Evista, Forteo: S/P [place] 2007 to 03/2010  . OSTEOPENIA 03/12/2008   Qualifier: Diagnosis of  By: Velora Heckler    . Osteoporosis   . PONV (postoperative nausea and vomiting)   . Rosacea 04/22/2009   Qualifier: Diagnosis of  By: Linna Darner MD, Gwyndolyn Saxon    . Skin cancer 06/22/2017   per patient after excision right calf by Dr Ubaldo Glassing  . SKIN CANCER, HX  OF 03/12/2008   Qualifier: Diagnosis of  By: Velora Heckler    . Urinary frequency   . Vaginal atrophy    sees Dr. Gertie Fey   Past Surgical History:  Procedure Laterality Date  . APPENDECTOMY    . BUNIONECTOMY Bilateral   . CATARACT EXTRACTION, BILATERAL    . COLONOSCOPY     with Tics (no further f/u as per GI due to age)   . KNEE ARTHROPLASTY  05/18/2012   Procedure: COMPUTER ASSISTED TOTAL KNEE ARTHROPLASTY;  Surgeon: Marybelle Killings, MD;  Location: [facility];  Service: Orthopedics;  Laterality: Left;  Left Total Knee Arthroplasty, Cemented, Computer Assisted  . lt knee      meniscus tear  . MOHS SURGERY     right nostril for basel cell cancer   . NOSE  SURGERY     due to trama at age 12   . TOTAL ABDOMINAL HYSTERECTOMY W/ BILATERAL SALPINGOOPHORECTOMY     for Endometriosis   . TOTAL SHOULDER ARTHROPLASTY Right 09/11/2013   Procedure: TOTAL SHOULDER ARTHROPLASTY;  Surgeon: Marybelle Killings, MD;  Location: [facility];  Service: Orthopedics;  Laterality: Right;  Right Total Shoulder Arthroplasty   Allergies  Allergen Reactions  . Alendronate Sodium Nausea And Vomiting  . Anaplex Hd Nausea And Vomiting  . Aspirin Nausea And Vomiting  . Azithromycin Nausea And Vomiting  . Calcium Nausea And Vomiting  . Codeine Nausea And Vomiting  . Doxycycline Nausea And Vomiting  . Hydrocodone Nausea And Vomiting  . Ibandronate Sodium     REACTION: severe body aches  . Ibuprofen Nausea And Vomiting  . Penicillins Nausea And Vomiting  . Raloxifene Nausea And Vomiting  . Risedronate Sodium Nausea And Vomiting  . Sulfonamide Derivatives Nausea And Vomiting  . Teriparatide Nausea And Vomiting   Current Meds  Medication Sig  . azelastine (ASTELIN) 0.1 % nasal spray Place 1 spray into both nostrils 2 (two) times daily. Use in each nostril as directed  . B Complex-C (B-COMPLEX WITH VITAMIN C) tablet Take 1 tablet by mouth daily.  . cholecalciferol (VITAMIN D) 1000 UNITS tablet Take 1,000 Units by mouth daily.   [name] estradiol (ESTRACE) 0.5 MG tablet Take 0.5 mg by mouth every other day.   [name] FLUOCINOLONE ACETONIDE OT Place in ear(s).  . fluticasone (FLONASE) 50 MCG/ACT nasal spray Place 1 spray into both nostrils 2 (two) times daily.  [name] OVER THE COUNTER MEDICATION Take 1 capsule by mouth 2 (two) times daily. [place]7 daily.  [name] OVER THE COUNTER MEDICATION 2 (two) times daily. Preservision vitamin for eyes  . Polyethyl Glycol-Propyl Glycol (SYSTANE OP) Apply to eye daily.  [name] PREMARIN vaginal cream   . Propylene Glycol (SYSTANE BALANCE) 0.6 % SOLN Apply to eye.  . vitamin E 400 UNIT capsule Take 1 capsule by mouth daily.   Social History   Tobacco Use   . Smoking status: Never Smoker  . Smokeless tobacco: Never Used  Substance Use Topics  . Alcohol use: No   Family History  Problem Relation Age of Onset  . Colon cancer Other        1st Cousin   . Stroke Mother 18  . Diabetes Brother   . Atrial fibrillation Brother        coumadin  . Lung cancer Sister      Review of Systems  Constitutional: Negative for chills, fatigue and fever.  Respiratory: Negative for cough, chest tightness, shortness of breath and wheezing.  Cardiovascular: Negative for chest pain, palpitations and leg swelling.  Musculoskeletal: Positive for arthralgias (left knee).    Objective:  There were no vitals taken for this visit.      BP Readings from Last 3 Encounters:  11/01/18 120/60  04/05/18 110/60  03/09/18 110/68   Wt Readings from Last 3 Encounters:  11/01/18 112 lb 6 oz (51 kg)  04/05/18 113 lb 9 oz (51.5 kg)  03/09/18 115 lb 9.6 oz (52.4 kg)    EXAM:  GENERAL: alert, oriented, sounds well and in no acute distress LUNGS: no shortness of breath over phone PSYCH/NEURO: pleasant and cooperative, no obvious depression or anxiety, speech and thought processing grossly intact  Assessment/Plan  1. Other fatigue Keep up with exercise and healthy behavior.  - CBC with Differential/Platelet; Future - Comprehensive metabolic panel; Future - TSH; Future  2. Weakness She is interested in getting some blood work.  We will call her to set this up.  3. Seasonal allergic rhinitis due to other allergic trigger She has difficulty with tolerating medications.  Consider allergy referral if patient desires.  4. Osteoarthritis of left knee, unspecified osteoarthritis type Stable.  Keep up with daily walking.  5. Macular degeneration, unspecified laterality, unspecified type Following with ophthalmology.  6. Hyperlipidemia, unspecified hyperlipidemia type - Lipid panel; Future - TSH; Future  7. Vitamin D deficiency - VITAMIN D 25 Hydroxy  (Vit-D Deficiency, Fractures); Future    Return pending bloodwork.   I discussed the assessment and treatment plan with the patient. The patient was provided an opportunity to ask questions and all were answered. The patient agreed with the plan and demonstrated an understanding of the instructions.   The patient was advised to call back or seek an in-person evaluation if the symptoms worsen or if the condition fails to improve as anticipated.  I provided 25 minutes of non-face-to-face time during this encounter. Can set up AWV pending bloodwork and in office as well  Micheline Rough, MD

## 2018-12-13 ENCOUNTER — Telehealth: Payer: Self-pay | Admitting: *Deleted

## 2018-12-13 NOTE — Telephone Encounter (Signed)
-----   Message from Caren Macadam, MD sent at 12/12/2018 11:41 AM EDT ----- Please set up lab visit for her

## 2018-12-13 NOTE — Telephone Encounter (Signed)
Lab appt scheduled for 8/5 at 10:20am.

## 2018-12-14 ENCOUNTER — Other Ambulatory Visit: Payer: Self-pay

## 2018-12-14 ENCOUNTER — Other Ambulatory Visit (INDEPENDENT_AMBULATORY_CARE_PROVIDER_SITE_OTHER): Payer: Medicare Other

## 2018-12-14 DIAGNOSIS — E785 Hyperlipidemia, unspecified: Secondary | ICD-10-CM

## 2018-12-14 DIAGNOSIS — E559 Vitamin D deficiency, unspecified: Secondary | ICD-10-CM | POA: Diagnosis not present

## 2018-12-14 DIAGNOSIS — R5383 Other fatigue: Secondary | ICD-10-CM

## 2018-12-14 LAB — COMPREHENSIVE METABOLIC PANEL
ALT: 16 U/L (ref 0–35)
AST: 19 U/L (ref 0–37)
Albumin: 4.3 g/dL (ref 3.5–5.2)
Alkaline Phosphatase: 51 U/L (ref 39–117)
BUN: 14 mg/dL (ref 6–23)
CO2: 29 mEq/L (ref 19–32)
Calcium: 9.7 mg/dL (ref 8.4–10.5)
Chloride: 105 mEq/L (ref 96–112)
Creatinine, Ser: 0.88 mg/dL (ref 0.40–1.20)
GFR: 59.51 mL/min — ABNORMAL LOW (ref >60.00)
Glucose, Bld: 82 mg/dL (ref 70–99)
Potassium: 4 mEq/L (ref 3.5–5.1)
Sodium: 141 mEq/L (ref 135–145)
Total Bilirubin: 0.7 mg/dL (ref 0.2–1.2)
Total Protein: 6.6 g/dL (ref 6.0–8.3)

## 2018-12-14 LAB — CBC WITH DIFFERENTIAL/PLATELET
Basophils Absolute: 0.1 10*3/uL (ref 0.0–0.1)
Basophils Relative: 1.2 % (ref 0.0–3.0)
Eosinophils Absolute: 0.2 10*3/uL (ref 0.0–0.7)
Eosinophils Relative: 3 % (ref 0.0–5.0)
HCT: 40.7 % (ref 36.0–46.0)
Hemoglobin: 13.4 g/dL (ref 12.0–15.0)
Lymphocytes Relative: 31.2 % (ref 12.0–46.0)
Lymphs Abs: 1.9 10*3/uL (ref 0.7–4.0)
MCHC: 32.9 g/dL (ref 30.0–36.0)
MCV: 92.6 fl (ref 78.0–100.0)
Monocytes Absolute: 0.5 10*3/uL (ref 0.1–1.0)
Monocytes Relative: 9.1 % (ref 3.0–12.0)
Neutro Abs: 3.3 10*3/uL (ref 1.4–7.7)
Neutrophils Relative %: 55.5 % (ref 43.0–77.0)
Platelets: 163 10*3/uL (ref 150.0–400.0)
RBC: 4.39 Mil/uL (ref 3.87–5.11)
RDW: 14.7 % (ref 11.5–15.5)
WBC: 6 10*3/uL (ref 4.0–10.5)

## 2018-12-14 LAB — LIPID PANEL
Cholesterol: 174 mg/dL (ref 0–200)
HDL: 67.6 mg/dL (ref >39.00)
LDL Cholesterol: 93 mg/dL (ref 0–99)
NonHDL: 105.97
Total CHOL/HDL Ratio: 3
Triglycerides: 67 mg/dL (ref 0.0–149.0)
VLDL: 13.4 mg/dL (ref 0.0–40.0)

## 2018-12-14 LAB — TSH: TSH: 6.89 u[IU]/mL — ABNORMAL HIGH (ref 0.35–4.50)

## 2018-12-14 LAB — VITAMIN D 25 HYDROXY (VIT D DEFICIENCY, FRACTURES): VITD: 33.71 ng/mL (ref 30.00–100.00)

## 2018-12-21 ENCOUNTER — Telehealth: Payer: Self-pay | Admitting: Family Medicine

## 2018-12-21 MED ORDER — SYNTHROID 25 MCG PO TABS: 25.0000 ug | ORAL_TABLET | Freq: Every day | ORAL | 2 refills | Status: DC

## 2018-12-21 NOTE — Telephone Encounter (Signed)
Pt went over her labs with the nurse and has decided to start with the thyroid medicine and have it called into the pharmacy / please call Pt to discuss

## 2018-12-21 NOTE — Telephone Encounter (Signed)
See request °

## 2018-12-21 NOTE — Telephone Encounter (Signed)
Pt called and stated she was going for a 45 minute walk and didn't want to miss a call from the office please call her after 12 noon or she stated the medication can just be sent to   [place], [place] - [address] [phone] (Phone) [phone] (Fax)

## 2018-12-21 NOTE — Telephone Encounter (Signed)
Rx done. 

## 2018-12-22 ENCOUNTER — Telehealth: Payer: Self-pay | Admitting: Family Medicine

## 2018-12-22 NOTE — Telephone Encounter (Signed)
I called the pt and she stated she prefers to have generic Rx sent instead due to the cost as she can get a 90-day supply from [facility] for $25 vs $38.25 for a 30-day supply.  Message sent to Dr Ethlyn Gallery.

## 2018-12-22 NOTE — Telephone Encounter (Signed)
Copied from [place] [id number]. Topic: General - Other >> Dec 22, 2018 12:38 PM Yvette Rack wrote: Reason for CRM: Pt stated she needs to speak with Wendie Simmer about generic medication for thyroid. Pt asked that Wendie Simmer call her back after 1:30 pm today.

## 2018-12-26 ENCOUNTER — Other Ambulatory Visit: Payer: Self-pay | Admitting: Family Medicine

## 2018-12-26 MED ORDER — SYNTHROID 25 MCG PO TABS: 25.0000 ug | ORAL_TABLET | Freq: Every day | ORAL | 2 refills | Status: DC

## 2018-12-26 NOTE — Telephone Encounter (Signed)
Called pt and advised that prescription was sent to mail order by PCP.

## 2018-12-26 NOTE — Telephone Encounter (Signed)
Doesn't look like we have sent to [facility] for her in past, but I did send 90 day supply there for her just now.

## 2019-01-02 ENCOUNTER — Telehealth: Payer: Self-pay

## 2019-01-02 DIAGNOSIS — R7989 Other specified abnormal findings of blood chemistry: Secondary | ICD-10-CM

## 2019-01-02 NOTE — Telephone Encounter (Signed)
Copied from [place] [id number]. Topic: General - Other >> Jan 02, 2019  2:53 PM Jeri Cos wrote: Reason for CRM: Pt would like the nurse for Dr. Ethlyn Gallery to call her back  concerning her medication SYNTHROID 25 MCG tablet. Pt states that the generic is cheaper but she doesn't know if she shouls continue taking it. The medication is making her dizzy and cause bad sleeping patterns for her.

## 2019-01-03 ENCOUNTER — Telehealth: Payer: Self-pay | Admitting: *Deleted

## 2019-01-03 NOTE — Telephone Encounter (Signed)
[facility] faxed a refill request for Levothyroxine 12mcg.  Message sent to Dr Ethlyn Gallery as last Rx was sent for the brand name only.

## 2019-01-04 NOTE — Telephone Encounter (Signed)
Patient was informed of the message below.  Lab appt scheduled for 9/25.

## 2019-01-04 NOTE — Telephone Encounter (Signed)
Have her stop the medication for now and let's recheck TSH in 4 weeks. Please order and set up lab visit. Let us know if sx are not improving in 1-2 weeks of stopping medication or if any worsening.

## 2019-01-06 ENCOUNTER — Telehealth: Payer: Self-pay

## 2019-01-06 NOTE — Telephone Encounter (Signed)
We can put in referral for her if desired. Please see what is going on to make sure not something urgent that she needs visit for?   I usually leave it up to referrals to pick provider that is in network.   Please go ahead and place referral if desired.

## 2019-01-06 NOTE — Telephone Encounter (Signed)
Copied from [place] [id number]. Topic: General - Other >> Jan 06, 2019 12:52 PM Wynetta Emery, [place] C wrote: Reason for CRM: pt is calling in to find out who could she see for a podiatrist. Pt says that she would like a call back as soon as possible.

## 2019-01-09 NOTE — Telephone Encounter (Signed)
Called pt and she stated she would like to see a podiatrist for laser treatment she heard about that was better than a pill due to a toenail fungus she has had for some time.  Stated she will check her insurance book and make her own appt.

## 2019-01-19 ENCOUNTER — Telehealth: Payer: Self-pay

## 2019-01-19 DIAGNOSIS — J3089 Other allergic rhinitis: Secondary | ICD-10-CM

## 2019-01-19 NOTE — Telephone Encounter (Signed)
Copied from [place] [phone]. Topic: Referral - Request for Referral >> Jan 19, 2019 10:39 AM Rayann Heman wrote: Has patient seen PCP for this complaint? yes Referral for which specialty: Allergist  Preferred provider/office:[facility]  Reason for referral:allergies/ Office needs OV notes

## 2019-01-19 NOTE — Telephone Encounter (Signed)
[place] for referral and can send my last note if needed.

## 2019-01-20 NOTE — Telephone Encounter (Signed)
I called the pt and left a detailed message the referral entered as below and the referral coordinator will send the notes.

## 2019-02-03 ENCOUNTER — Other Ambulatory Visit (INDEPENDENT_AMBULATORY_CARE_PROVIDER_SITE_OTHER): Payer: Medicare Other

## 2019-02-03 ENCOUNTER — Other Ambulatory Visit: Payer: Self-pay

## 2019-02-03 DIAGNOSIS — R7989 Other specified abnormal findings of blood chemistry: Secondary | ICD-10-CM

## 2019-02-03 LAB — TSH: TSH: 6.65 u[IU]/mL — ABNORMAL HIGH (ref 0.35–4.50)

## 2019-02-07 ENCOUNTER — Telehealth: Payer: Self-pay | Admitting: *Deleted

## 2019-02-07 NOTE — Telephone Encounter (Signed)
Reviewed lab results and physician's note with patient. She stated she has not been taking the thyroid medication for about 2 week, that she called and left a message the was having terrible itching and did not hear back from office.She feels good and is no longer fatigued.  She would like a callback as to what she should do now, please.

## 2019-02-08 ENCOUNTER — Other Ambulatory Visit: Payer: Self-pay | Admitting: Family Medicine

## 2019-02-08 NOTE — Telephone Encounter (Signed)
Sorry I see previous note now where we had her hold it due to side effects. So if she is feeling better, then let's stay off of medication. We can recheck thyroid in a few months with bloodwork(ok to order TSH, Free T3, Free T4 and set up lab visit if desired). Let me know if fatigue increases in meanwhile.

## 2019-02-08 NOTE — Telephone Encounter (Signed)
Home number busy x2-will attempt to call back.

## 2019-02-09 NOTE — Telephone Encounter (Signed)
Left a message for the pt to return my call.  CRM also created.  

## 2019-02-20 ENCOUNTER — Other Ambulatory Visit: Payer: Medicare Other

## 2019-03-10 ENCOUNTER — Telehealth: Payer: Self-pay | Admitting: Family Medicine

## 2019-03-10 DIAGNOSIS — R7989 Other specified abnormal findings of blood chemistry: Secondary | ICD-10-CM

## 2019-03-10 NOTE — Telephone Encounter (Signed)
Patient requesting 90 day supply generic only levothyroxine, informed patient please allow 48 to 72 hour turn around time.   [place], [place] [phone] (Phone) [phone] (Fax)   Just a FYI -Patient states she cant take flonase as per allergist stating it makes her dizzy, patient was prescribed simply saline

## 2019-03-10 NOTE — Telephone Encounter (Signed)
Left a message for the pt to return my call.  

## 2019-03-10 NOTE — Telephone Encounter (Signed)
Rx is not on pts med list-please see prior note regarding medication.  I eft a message for the pt to return my call.  CRM also created.

## 2019-03-13 NOTE — Telephone Encounter (Signed)
Please clarify with patient -   Looks like she stopped med 8/26 note due to side effects.   We rechecked thyroid but she never answered when we called with lab results, so I was never sure if she restarted or when she restarted message.   Has she been taking daily now? How is she feeling with this? I am not sure if I am missing or forgetting message, but I was not under impression that she was still taking med.

## 2019-03-13 NOTE — Telephone Encounter (Signed)
Spoke with patient. Patient reports Dr. Ethlyn Gallery rx'd this medication. See lab notes from 12/2018. Patient is requesting a 90 day supply from [facility] and she runs out of medication on Thursday.

## 2019-03-13 NOTE — Telephone Encounter (Signed)
Left a message for the pt to return my call.  

## 2019-03-14 ENCOUNTER — Telehealth: Payer: Self-pay | Admitting: *Deleted

## 2019-03-14 MED ORDER — LEVOTHYROXINE SODIUM 25 MCG PO TABS: 25.0000 ug | ORAL_TABLET | Freq: Every day | ORAL | 0 refills | Status: DC

## 2019-03-14 NOTE — Telephone Encounter (Signed)
l called the pt and informed her of the message below.  Patient stated she stopped taking Synthroid for almost 2 weeks initially, restarted after seeing Dr Harold Hedge, has been taking the medication daily, stated the itching has resolved and she is feeling OK.  Patient stated she talked with someone here before, told them this and now requested a generic instead due to the cost.  Message sent to Dr Ethlyn Gallery.

## 2019-03-14 NOTE — Telephone Encounter (Signed)
I called the pt and informed her the Rx was sent to [facility].  Lab appt scheduled for 05/15/2019.

## 2019-03-14 NOTE — Telephone Encounter (Signed)
See phone note from 11/3.

## 2019-03-14 NOTE — Telephone Encounter (Signed)
Patient returning call. Requesting a call back.

## 2019-03-14 NOTE — Telephone Encounter (Signed)
Copied from [place] [phone]. Topic: Medicare AWV >> Mar 14, 2019  1:37 PM Keene Breath wrote: Reason for CRM: Patient called to schedule an annual wellness appt.  Please call patient back at [phone]

## 2019-03-14 NOTE — Telephone Encounter (Signed)
Ok. Thanks for clarification. Ok to send in 3 month supply of same dose. Plan to recheck TSH in about 2 months. Please order TSH.

## 2019-03-14 NOTE — Telephone Encounter (Signed)
Left a detailed message at the pts home number to call the office to schedule an appt with Dr Maudie Mercury for a phone visit in order to have the annual wellness visit any day after 11/26 (last one was done on 04/05/2018).

## 2019-03-15 NOTE — Telephone Encounter (Signed)
Patient scheduled for 12/01 at 12 PM with Dr. Maudie Mercury

## 2019-03-23 ENCOUNTER — Ambulatory Visit: Payer: Self-pay | Admitting: *Deleted

## 2019-03-23 NOTE — Telephone Encounter (Signed)
I started a thyroid pill on August and had to stop it.   I ended up stopping it due to itching.  Oct 19th I restarted the thyroid medication    Last week I started itching.   I stopped the thyroid medicine again.  I'm not going to take the thyroid medicine this morning.  She had multiple complaints.   I had a hard time keeping her focused on one thing at a time.    She was not confused just jumping from one symptom to another.   I had to keep redirecting her in order to get a semi straight story.      I'm dizzy, muscles are sore and I don't feel good since I stopped the thyroid medication.  I did not take the thyroid pill this morning.   I called my pharmacist this morning.   I took a Benadryl at 11:15.    The pharmacist told me I might have Shingles.    It's not a bad itch but it does itch and then it has a burning. I was itching on my waist, my breast, and my back.  There is not a rash.  I'm not focusing good and I'm dizzy.   At this point I warm transferred her call into Dr. Berenice Bouton office to be scheduled since she seems to have several issues with the itching and burning being the main symptom.   She was agreeable to being scheduled.    Unclear what protocol to use so went with the PCP triage.   She is being scheduled with Dr Ethlyn Gallery.   I sent my notes to the office.     Reason for Disposition . Patient sounds very sick or weak to the triager    Itching with burning.   I'm dizzy too.  Answer Assessment - Initial Assessment Questions 1. APPEARANCE of RASH: "Describe the rash."       It's not broken out.    I don't see a rash.   It feels like a burning on my back and on my chest now.   It's not itching so much now.    My muscles are sore.   I called the pharmacist this morning and he told me I might have Shingles.   2. LOCATION: "Where is the rash located?"      I feel like a burning under my skin  The itching started on my breast.   I'm not sleeping good. 3. ONSET: "When did  the rash start?"      It's not a rash.     I wonder if it's from the thyroid medication.   I have stopped taking it and then restarted it.   All last week it started again. 4. ITCHING: "Does the rash itch?" If so, ask: "How bad is the itch?"  (Scale 1-10; or mild, moderate, severe)     *No Answer* 5. PAIN: "Does the rash hurt?" If so, ask: "How bad is the pain?"  (Scale 1-10; or mild, moderate, severe)     It burns like under my skin 6. OTHER SYMPTOMS: "Do you have any other symptoms?" (e.g., fever)     I feel dizzy.    I had to use the magnification glass to look at my bank statement. 7. PREGNANCY: "Is there any chance you are pregnant?" "When was your last menstrual period?"     N/A  Protocols used: [facility]

## 2019-03-23 NOTE — Telephone Encounter (Signed)
Noted  

## 2019-03-24 ENCOUNTER — Ambulatory Visit: Payer: Medicare Other | Admitting: Family Medicine

## 2019-03-24 ENCOUNTER — Encounter: Payer: Self-pay | Admitting: Family Medicine

## 2019-03-24 ENCOUNTER — Other Ambulatory Visit: Payer: Self-pay

## 2019-03-24 VITALS — BP 102/78 | HR 78 | Temp 97.9°F | Ht 61.0 in | Wt 116.3 lb

## 2019-03-24 DIAGNOSIS — M533 Sacrococcygeal disorders, not elsewhere classified: Secondary | ICD-10-CM

## 2019-03-24 DIAGNOSIS — T887XXA Unspecified adverse effect of drug or medicament, initial encounter: Secondary | ICD-10-CM | POA: Diagnosis not present

## 2019-03-24 DIAGNOSIS — E039 Hypothyroidism, unspecified: Secondary | ICD-10-CM

## 2019-03-24 NOTE — Progress Notes (Signed)
Heidi Moore DOB: 12-01-1919 Encounter date: 03/24/2019  This is a 85 y.o. female who presents with Chief Complaint  Patient presents with  . Medication Reaction    patient complains of dizziness and states she stopped taking Levothyroxine on 4 days agoi    History of present illness: Looked on calendar. Started the synthroid 8/12 and stopped medication 8/26. Started getting itchy all over. Has had left knee replacement 6 years ago. Has to be careful with walking. She was getting some aching in left knee and fell last year because of this; had a long and hard recovery. Was evaluated for this then. Just cautious of anything that makes her not feel well. Flare on and off. Just felt like muscles just get sore on and off. Missing going to gym which she had to stop doing in march when [place] hit.   When restarted medication felt like she had some difficulty with vision. Started October 19th. Had been on for 3 weeks. Felt more dizzy and felt off balance and not seeing well. Then stopped medication. Everything also makes her nauseated, sick on stomach.    Has had fungus in toenails - was put on cipro after toenail removed and this did a number on her. Took it for 3 days then had to stop. Was put on different antibiotic - clindamycin - and seemed to tolerate this better when she had right toenail removed. Right toe hasn't fully healed. Left toenail done over a month ago.   Doesn't even take vitamins until she eats because she is so easily nauseated. Drinking some water with ice first before eating.   Stopped the thyroid medication earlier this week. After talking with nurse yesterday, took benadryl yesterday at 11:15 and another one at 3:30. Took another at 8:30 and then another at 12:30. Took 2 hour nap yesterday. Burning and and itching have gone away, but still sore in muscles of left lower back/upper buttock.   Felt like she was getting more achy with the synthroid.    Allergies  Allergen  Reactions  . Alendronate Sodium Nausea And Vomiting  . Anaplex Hd Nausea And Vomiting  . Aspirin Nausea And Vomiting  . Azithromycin Nausea And Vomiting  . Calcium Nausea And Vomiting  . Ciprofloxacin     Patient states this caused nausea, dizziness and affected her muscles  . Codeine Nausea And Vomiting  . Doxycycline Nausea And Vomiting  . Hydrocodone Nausea And Vomiting  . Ibandronate Sodium     REACTION: severe body aches  . Ibuprofen Nausea And Vomiting  . Penicillins Nausea And Vomiting  . Raloxifene Nausea And Vomiting  . Risedronate Sodium Nausea And Vomiting  . Sulfonamide Derivatives Nausea And Vomiting  . Teriparatide Nausea And Vomiting   Current Meds  Medication Sig  . azelastine (ASTELIN) 0.1 % nasal spray Place 1 spray into both nostrils 2 (two) times daily. Use in each nostril as directed  . B Complex-C (B-COMPLEX WITH VITAMIN C) tablet Take 1 tablet by mouth daily.  . cholecalciferol (VITAMIN D) 1000 UNITS tablet Take 1,000 Units by mouth daily.   [name] estradiol (ESTRACE) 0.5 MG tablet Take 0.5 mg by mouth every other day.   [name] FLUOCINOLONE ACETONIDE OT Place in ear(s).  . fluticasone (FLONASE) 50 MCG/ACT nasal spray Place 1 spray into both nostrils 2 (two) times daily.  [name] OVER THE COUNTER MEDICATION Take 1 capsule by mouth 2 (two) times daily. [place]7 daily.  [name] OVER THE COUNTER MEDICATION 2 (  two) times daily. Preservision vitamin for eyes  . Polyethyl Glycol-Propyl Glycol (SYSTANE OP) Apply to eye daily.  [name] PREMARIN vaginal cream   . Propylene Glycol (SYSTANE BALANCE) 0.6 % SOLN Apply to eye.  . vitamin E 400 UNIT capsule Take 1 capsule by mouth daily.    Review of Systems  Constitutional: Negative for chills, fatigue and fever.  Respiratory: Negative for cough, chest tightness, shortness of breath and wheezing.   Cardiovascular: Negative for chest pain, palpitations and leg swelling.  Musculoskeletal: Positive for back pain.  Skin:        Itching and burning of skin is better today  Neurological: Positive for dizziness (improved today).    Objective:  BP 102/78 (BP Location: Left Arm, Patient Position: Sitting, Cuff Size: Normal)   Pulse 78   Temp 97.9 F (36.6 C) (Temporal)   Ht 5\' 1"  (1.549 m)   Wt 116 lb 4.8 oz (52.8 kg)   SpO2 97%   BMI 21.97 kg/m   Weight: 116 lb 4.8 oz (52.8 kg)   BP Readings from Last 3 Encounters:  03/24/19 102/78  11/01/18 120/60  04/05/18 110/60   Wt Readings from Last 3 Encounters:  03/24/19 116 lb 4.8 oz (52.8 kg)  11/01/18 112 lb 6 oz (51 kg)  04/05/18 113 lb 9 oz (51.5 kg)    Physical Exam Constitutional:      General: She is not in acute distress.    Appearance: She is well-developed.  Cardiovascular:     Rate and Rhythm: Normal rate and regular rhythm.     Heart sounds: Murmur present. Systolic murmur present with a grade of 1/6. No friction rub.  Pulmonary:     Effort: Pulmonary effort is normal. No respiratory distress.     Breath sounds: Normal breath sounds. No wheezing or rales.  Musculoskeletal:     Right lower leg: No edema.     Left lower leg: No edema.     Comments: She has pinpoint tenderness and some muscle spasm palpable in the left sacroiliac area.  She has good range of motion of her left hip as well as back.  Neurological:     Mental Status: She is alert and oriented to person, place, and time.     Cranial Nerves: Cranial nerves are intact.     Motor: Motor function is intact. No weakness.     Gait: Gait is intact.  Psychiatric:        Behavior: Behavior normal.     Assessment/Plan  1. Medication side effect Her symptoms seem to be better since stopping her Synthroid medication.  We will plan to get a recheck on her blood work in January and see how her thyroid is doing.  We reviewed today that with the subtlety of her hypothyroidism, I do not feel the medication treatment will be necessary.  She seems to do better when not on medications, so we will  try to keep her off of them if possible.  I am going to call her next week to follow-up on her symptoms and see how she is feeling.  If feeling well, then she should not need additional follow-up until we are able to get blood work repeated on her.  If having any recurrence of symptoms, we may need to see her for further into more elaborate neurologic testing.  2. Pain of left sacroiliac joint She is following with chiropractor.  She is going to schedule appointment this week and let me know if  things are not feeling better by the end of next week when I check in with her.  We reviewed exercises and stretches to do to help with sacroiliac pain.  She is doing regular exercises as well as strength training on a daily basis.  We were able to review some stretches for the buttocks and hips that she had not been doing at home so she will try to add these into her regimen.  Continue to use ice and heat to help with symptoms.  3. Hypothyroidism, unspecified type See above.    Return for we will call you next week to check in on symptoms.     Micheline Rough, MD

## 2019-03-31 ENCOUNTER — Telehealth: Payer: Self-pay | Admitting: Family Medicine

## 2019-03-31 ENCOUNTER — Other Ambulatory Visit: Payer: Self-pay | Admitting: Family Medicine

## 2019-03-31 DIAGNOSIS — E559 Vitamin D deficiency, unspecified: Secondary | ICD-10-CM

## 2019-03-31 DIAGNOSIS — E039 Hypothyroidism, unspecified: Secondary | ICD-10-CM

## 2019-03-31 DIAGNOSIS — E538 Deficiency of other specified B group vitamins: Secondary | ICD-10-CM

## 2019-03-31 NOTE — Telephone Encounter (Signed)
-----   Message from Caren Macadam, MD sent at 03/24/2019  5:47 PM EST ----- Check in on symptoms, back pain, dizziness

## 2019-03-31 NOTE — Telephone Encounter (Signed)
I called patient to follow up from visit.   She is feeling better. No more dizziness.   She had itching last 3 mornings, but thinks from using lidocaine for hip.   Has appt with chiropractor on Tuesday.   Has had some light allergy sx; using nasacrom.   Worries that burning could come from taking niacin (part of her b complex she is taking). She is going to switch to isolated B12. Taking 2000 units Vitamin D. Hx of deficency. Wonders if taking too much.   She has bloodwork appt in Jan. We will add D, B12. She will let me know if pain in hip not improved after chiropractor.

## 2019-04-11 ENCOUNTER — Telehealth (INDEPENDENT_AMBULATORY_CARE_PROVIDER_SITE_OTHER): Payer: Medicare Other | Admitting: Family Medicine

## 2019-04-11 DIAGNOSIS — E039 Hypothyroidism, unspecified: Secondary | ICD-10-CM | POA: Diagnosis not present

## 2019-04-11 DIAGNOSIS — L853 Xerosis cutis: Secondary | ICD-10-CM

## 2019-04-11 DIAGNOSIS — T7840XA Allergy, unspecified, initial encounter: Secondary | ICD-10-CM

## 2019-04-11 DIAGNOSIS — Z0000 Encounter for general adult medical examination without abnormal findings: Secondary | ICD-10-CM

## 2019-04-11 NOTE — Patient Instructions (Addendum)
  Ms. Bauguess , Thank you for taking time to come for your Medicare Wellness Visit. I appreciate your ongoing commitment to your health goals. Please review the following plan we discussed and let me know if I can assist you in the future.   These are the goals we discussed: Goals      -continue to get out and walk daily, use caution with getting up and down, on steps if wet or icy and with turns -eat three healthy meals daily and snacks -continue to stay home during the pandemic and use masks and social distancing if around others for the duration of the pandemic -  This is a list of the screening recommended for you and due dates:  Health Maintenance  Topic Date Due  . Tetanus Vaccine  10/31/2023  . Flu Shot  Completed  . DEXA scan (bone density measurement)  Completed  . Pneumonia vaccines  Addressed

## 2019-04-11 NOTE — Addendum Note (Signed)
Addended by: Lucretia Kern on: 04/11/2019 12:46 PM   Modules accepted: Level of Service

## 2019-04-11 NOTE — Progress Notes (Addendum)
Medicare Annual Preventive Care Visit  (initial annual wellness or annual wellness exam)  Virtual Visit via Video Note  I connected with Heidi Moore by a video enabled telemedicine application and verified that I am speaking with the correct person using two identifiers. She was not able to utilize the video option, so completed the visit by phone.   Location patient: home Location provider:work or home office Persons participating in the virtual visit: patient, provider  Concerns and/or follow up today:  Heidi Moore is a pleasant 85 yo with a PMH OA, vertigo, hypothyroidism, osteopenia, glaucoma (sees optho - Dr. Kathlen Mody) and skin cancer (sees derm) seen for her AWV today. Very mild hypothyroidism on labs this year, but per notes she had severe itching after taking a low dose of synthroid so is not taking and is monitoring instead. Denies constipation, weight gain, heat/cold intol, feeling sluggish. Has dry skin - itchy lately and reports allergist has told her this is due to allergies. She had allergy testing in September and found out she is allergic to a lot. She wears mask when she does leaves. She spoke with Dr. Fredderick Phenix yesterday in follow up. She is taking allegra now. Staying home given the pandemic. Doing exercise at home and trying to get outside to walking  on a regular basis. Other then allergies is doing well. ? Flu shot - done ? Thyroid check - reports she has lab appt in Jan to recheck  See HM section in Epic for other details of completed HM. See scanned documentation under Media Tab for further documentation HPI, health risk assessment. See Media Tab and Care Teams sections in Epic for other providers.  ROS: negative for report of fevers, unintentional weight loss, vision changes, vision loss, hearing loss or change, chest pain, sob, hemoptysis, melena, hematochezia, hematuria, genital discharge or lesions, falls, bleeding or bruising, loc, thoughts of suicide or self harm, memory  loss  1.) Patient-completed health risk assessment  - completed and reviewed, see scanned documentation  2.) Review of Medical History: -PMH, PSH, Family History and current specialty and care providers reviewed and updated and listed below  - see scanned in document in chart and below Patient Care Team: Caren Macadam, MD as PCP - General (Family Medicine) Rolm Bookbinder, MD as Consulting Physician (Dermatology) Dr. Gertie Fey, GYN Dr. Kathlen Mody - Optho Dr. Harold Hedge -Allergy  Past Medical History:  Diagnosis Date  . AC (acromioclavicular) joint bone spurs    on right shoulder   . Allergic rhinitis   . Arthritis   . Bursitis    right shoulder  . Complication of anesthesia    headaches  . Diverticulosis   . DIVERTICULOSIS, COLON 10/15/2006   Qualifier: Diagnosis of  By: Linna Darner MD, Gwyndolyn Saxon Eye problem    treated by Dr Kathlen Mody per patient  . Headache(784.0)    sinus HA  . History of blood transfusion 25yrs ago   no abnormal reaction noted  . History of cystitis   . History of skin cancer    Basal cell, Dr. Lindwood Coke  . Hyperlipidemia    [place] Study LDL goal =<160   . Hypotension   . Joint pain   . Joint swelling   . Macular degeneration, dry   . Osteopenia    Intolerance to Calcium,Actonel,Fosamax,Evista, Forteo: S/P Boniva 2007 to 03/2010  . OSTEOPENIA 03/12/2008   Qualifier: Diagnosis of  By: Velora Heckler    . Osteoporosis   . PONV (postoperative nausea and vomiting)   .  Rosacea 04/22/2009   Qualifier: Diagnosis of  By: Linna Darner MD, Gwyndolyn Saxon    . Skin cancer 06/22/2017   per patient after excision right calf by Dr Ubaldo Glassing  . SKIN CANCER, HX OF 03/12/2008   Qualifier: Diagnosis of  By: Velora Heckler    . Urinary frequency   . Vaginal atrophy    sees Dr. Gertie Fey    Past Surgical History:  Procedure Laterality Date  . APPENDECTOMY    . BUNIONECTOMY Bilateral   . CATARACT EXTRACTION, BILATERAL    . COLONOSCOPY     with Tics (no further f/u as per GI due  to age)   . KNEE ARTHROPLASTY  05/18/2012   Procedure: COMPUTER ASSISTED TOTAL KNEE ARTHROPLASTY;  Surgeon: Marybelle Killings, MD;  Location: [facility];  Service: Orthopedics;  Laterality: Left;  Left Total Knee Arthroplasty, Cemented, Computer Assisted  . lt knee      meniscus tear  . MOHS SURGERY     right nostril for basel cell cancer   . NOSE SURGERY     due to trama at age 68   . TOTAL ABDOMINAL HYSTERECTOMY W/ BILATERAL SALPINGOOPHORECTOMY     for Endometriosis   . TOTAL SHOULDER ARTHROPLASTY Right 09/11/2013   Procedure: TOTAL SHOULDER ARTHROPLASTY;  Surgeon: Marybelle Killings, MD;  Location: [facility];  Service: Orthopedics;  Laterality: Right;  Right Total Shoulder Arthroplasty    Social History   Socioeconomic History  . Marital status: Widowed    Spouse name: Not on file  . Number of children: 3  . Years of education: Not on file  . Highest education level: Some college, no degree  Occupational History    Employer: RETIRED  Social Needs  . Financial resource strain: Not on file  . Food insecurity    Worry: Not on file    Inability: Not on file  . Transportation needs    Medical: Not on file    Non-medical: Not on file  Tobacco Use  . Smoking status: Never Smoker  . Smokeless tobacco: Never Used  Substance and Sexual Activity  . Alcohol use: No  . Drug use: No  . Sexual activity: Not on file  Lifestyle  . Physical activity    Days per week: Not on file    Minutes per session: Not on file  . Stress: Not on file  Relationships  . Social Herbalist on phone: Not on file    Gets together: Not on file    Attends religious service: Not on file    Active member of club or organization: Not on file    Attends meetings of clubs or organizations: Not on file    Relationship status: Not on file  . Intimate partner violence    Fear of current or ex partner: Not on file    Emotionally abused: Not on file    Physically abused: Not on file    Forced sexual activity: Not on  file  Other Topics Concern  . 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 She lives alone. She goes to the gym and participates in aerobics 3 x week. She drinks 3 cups of coffee a day.          Family History  Problem Relation Age of Onset  . Colon cancer Other        1st Cousin   . Stroke Mother 80  . Diabetes Brother   . Atrial fibrillation Brother        coumadin  . Lung cancer Sister     Current Outpatient Medications on File Prior to Visit  Medication Sig Dispense Refill  . azelastine (ASTELIN) 0.1 % nasal spray Place 1 spray into both nostrils 2 (two) times daily. Use in each nostril as directed 30 mL 1  . B Complex-C (B-COMPLEX WITH VITAMIN C) tablet Take 1 tablet by mouth daily.    . cholecalciferol (VITAMIN D) 1000 UNITS tablet Take 1,000 Units by mouth daily.     [name] estradiol (ESTRACE) 0.5 MG tablet Take 0.5 mg by mouth every other day.     [name] FLUOCINOLONE ACETONIDE OT Place in ear(s).    . fluticasone (FLONASE) 50 MCG/ACT nasal spray Place 1 spray into both nostrils 2 (two) times daily. 16 g 3  . OVER THE COUNTER MEDICATION Take 1 capsule by mouth 2 (two) times daily. [place]7 daily.    [name] OVER THE COUNTER MEDICATION 2 (two) times daily. Preservision vitamin for eyes    . Polyethyl Glycol-Propyl Glycol (SYSTANE OP) Apply to eye daily.    [name] PREMARIN vaginal cream     . Propylene Glycol (SYSTANE BALANCE) 0.6 % SOLN Apply to eye.    . vitamin E 400 UNIT capsule Take 1 capsule by mouth daily.     No  current facility-administered medications on file prior to visit.      3.) Review of functional ability and level of safety:  Any difficulty hearing?  reports is stable, feels has been ok  History of falling? Denies any falls in the last year  Any trouble with IADLs - using a phone, using transportation, grocery shopping, preparing meals, doing housework, doing laundry, taking medications and managing money?  Daughter helps with her grocery shopping given the pandemic - she is avoiding going in stores due to the pandemic.   Advance Directives?  Discussed advanced directive and she reports this is set up. Has HCPOA and living will.  See summary of recommendations in Patient Instructions below.  4.) Physical Exam There were no vitals filed for this visit. Estimated body mass index is 21.97 kg/m as calculated from the following:   Height as of 03/24/19: 5\' 1"  (1.549 m).   Weight as of 03/24/19: 116 lb 4.8 oz (52.8 kg).  EKG (optional): deferred  VITALS per patient if applicable: deferred due to virtual visit  GENERAL: alert, oriented,  Sounds well  HEENT: deferred due to telephonic visit  NECK: deferred due to telephonic  LUNGS: no audible signs of resp distress  PSYCH/NEURO: pleasant and cooperative, no obvious depression or anxiety, speech and thought processing grossly intact, Cognitive function grossly intact per conversation  Depression screen [facility] 2/9 04/11/2019 04/05/2018 10/05/2017 03/25/2017 11/07/2015  Decreased Interest 0 0 0 0 0  Down, Depressed, Hopeless 0 0 0 0 0  PHQ - 2 Score 0 0 0 0 0     See patient instructions for recommendations.  Education and counseling regarding the above review of health provided with a plan for the following: -see scanned patient completed form for further details -fall prevention strategies discussed  -healthy lifestyle discussed -importance and resources for completing advanced directives discussed -see patient instructions  below for any other recommendations provided  4)The following written screening schedule of preventive measures were reviewed with assessment and plan made per below, orders and patient instructions:       Alcohol screening none     Obesity Screening and counseling done - wt 115 now per pt     STI screening (Hep C if born [birth year]) offered and per pt wishes     Tobacco Screening none       Pneumococcal (PPSV23 -one dose after 64, one before if risk factors), influenza yearly and hepatitis B vaccines (if high risk - end stage renal disease, IV drugs, homosexual men, live in home for mentally retarded, hemophilia receiving factors) ASSESSMENT/PLAN: done       Screening mammograph (yearly if >40) ASSESSMENT/PLAN: pt has declined 2ndary to age      Screening Pap smear/pelvic exam (q2 years) ASSESSMENT/PLAN: n/a, declined, sees gyn for vaginal atrophy      Colorectal cancer screening (FOBT yearly or flex sig q4y or colonoscopy q10y or barium enema q4y) ASSESSMENT/PLAN: n/a declined      Diabetes outpatient self-management training services ASSESSMENT/PLAN: utd or done      Bone mass measurements(covered q2y if indicated - estrogen def, osteoporosis, hyperparathyroid, vertebral abnormalities, osteoporosis or steroids) ASSESSMENT/PLAN: utd or discussed and ordered per pt wishes      Screening for glaucoma(q1y if high risk - diabetes, FH, AA and > 50 or hispanic and > 65) ASSESSMENT/PLAN: reports now is doing bone density tests with her gynecologist, has osteopenia, doing repeat bone density in the spring with gyn per her report      Medical nutritional therapy for individuals with diabetes or renal disease ASSESSMENT/PLAN: see orders      Cardiovascular screening blood tests (lipids q5y) ASSESSMENT/PLAN: see orders and labs      Diabetes screening tests ASSESSMENT/PLAN: see orders and labs   7.) Summary:  Medicare annual wellness visit, subsequent -risk factors and conditions per  above assessment were discussed and treatment, recommendations and referrals were offered per documentation above and orders and patient instructions.  Hypothyroidism, unspecified type -does not seem to be symptomatic and has very mild TSH abnormality, she has lab scheduled for recheck in Jan. With her anxiety regarding the medication fair to stay off for now and recheck.  Xerosis of skin -advised good emollient - Cerave cream or Aquafor  Allergy, initial encounter -seeing allergist  Spent a total of 35 minutes on the phone with the patient during this visit.   Patient Instructions    Heidi Moore , Thank you for taking time to come for your Medicare Wellness Visit. I appreciate your ongoing commitment to your health goals. Please review the following plan we discussed and let me know if I can assist you in the future.   These are the goals we discussed: Goals      -continue to get out and walk daily, use caution with getting up and down, on steps if wet or icy and with turns -eat three healthy meals daily and snacks -continue to stay home during the pandemic and use masks and social distancing if around others for the duration of the pandemic   This is a list of the screening recommended for you  and due dates:  Health Maintenance  Topic Date Due  . Tetanus Vaccine  10/31/2023  . Flu Shot  Completed  . DEXA scan (bone density measurement)  Completed  . Pneumonia vaccines  Addressed    Heidi Kern, DO

## 2019-04-21 ENCOUNTER — Other Ambulatory Visit: Payer: Self-pay | Admitting: Allergy and Immunology

## 2019-04-21 ENCOUNTER — Other Ambulatory Visit: Payer: Self-pay

## 2019-04-21 ENCOUNTER — Ambulatory Visit
Admission: RE | Admit: 2019-04-21 | Discharge: 2019-04-21 | Disposition: A | Discharge status: Home / Self Care | Payer: Medicare Other | Source: Ambulatory Visit | Attending: Allergy and Immunology | Admitting: Allergy and Immunology

## 2019-04-21 DIAGNOSIS — R05 Cough: Secondary | ICD-10-CM

## 2019-04-21 DIAGNOSIS — R059 Cough, unspecified: Secondary | ICD-10-CM

## 2019-04-26 ENCOUNTER — Telehealth: Payer: Self-pay | Admitting: *Deleted

## 2019-04-26 ENCOUNTER — Encounter: Payer: Self-pay | Admitting: Family Medicine

## 2019-04-26 ENCOUNTER — Telehealth (INDEPENDENT_AMBULATORY_CARE_PROVIDER_SITE_OTHER): Payer: Medicare Other | Admitting: Family Medicine

## 2019-04-26 ENCOUNTER — Other Ambulatory Visit: Payer: Self-pay

## 2019-04-26 DIAGNOSIS — L03119 Cellulitis of unspecified part of limb: Secondary | ICD-10-CM | POA: Diagnosis not present

## 2019-04-26 DIAGNOSIS — J3089 Other allergic rhinitis: Secondary | ICD-10-CM

## 2019-04-26 MED ORDER — MUPIROCIN 2 % EX OINT: 1.0000 "application " | TOPICAL_OINTMENT | Freq: Two times a day (BID) | CUTANEOUS | 0 refills | Status: DC

## 2019-04-26 MED ORDER — CLINDAMYCIN HCL 300 MG PO CAPS: 300.0000 mg | ORAL_CAPSULE | Freq: Three times a day (TID) | ORAL | 0 refills | Status: AC

## 2019-04-26 NOTE — Telephone Encounter (Signed)
I called the pt and she stated she fell 2 days ago and a laceration occurred to the right leg above the knee.  Stated she has used antibiotic ointment and now complains of swelling and redness.  Patient stated the area feels "numb" and denies any pain, streaks nor drainage from the area. Appt for virtual visit scheduled today and the pt is aware to expect a call at 3:30-3:45pm and message sent to Dr Ethlyn Gallery.

## 2019-04-26 NOTE — Progress Notes (Signed)
Virtual Visit via Telephone Note  I connected with Heidi Moore  on 04/26/19 at  3:15 PM EST by telephone and verified that I am speaking with the correct person using two identifiers.   I discussed the limitations, risks, security and privacy concerns of performing an evaluation and management service by telephone and the availability of in person appointments. I also discussed with the patient that there may be a patient responsible charge related to this service. The patient expressed understanding and agreed to proceed.  Location patient: home Location provider: work office Participants present for the call: patient, provider Patient did not have a visit in the prior 7 days to address this/these issue(s).   History of Present Illness: Has been following with Dr. Fredderick Phenix - just itching; feeling like she has "fever" inside body. Dr. Fredderick Phenix put her on anti-histamine.  Feels like she has been fighting the symptoms since September.  She states that she was told she was reacting to mold.  The Claritin helps, but makes her sleepy so she does not like to take it.  She tends to feel very foggy mentally with medications and especially antihistamines.  Went behind mailbox to hang bow and ended up falling by [employer]. Took quarter of skin off about 5 inches above ankle. Plant stalk (Azalia) also cut area on thigh about same size. Neighbor helped her up and gave her triple antibiotic to put on wound. Had a little bleeding and put bandage over it.   Cut around ankle is red around where skin was taken off. Was able to put skin back in place and has been putting antibiotic ointment on there TID. Stopped hurting, but worried about swelling around that area and redness. Had some bloody/watery drainage from wound that night and next day. Last night felt a little bit numb around that area of wound.  Observations/Objective: Patient sounds cheerful and well on the phone. I do not appreciate any SOB. Speech  and thought processing are grossly intact. Patient reported vitals:  Assessment and Plan:  1. Cellulitis of lower extremity, unspecified laterality She has difficulty with tolerating medications, but states she knows she is able to tolerate clindamycin fine.  We will put her on a short course of this due to increased redness and some swelling around skin tear.  I will check back in with her on Friday to see how the leg is looking.  I instructed her that she will need to be seen if she has any worsening of leg wound.  I have also instructed her to keep wound moist with antibiotic ointment.  Bactroban was sent in as this tends to be less irritating to the skin than the Neosporin that she is currently using.  Also advised to keep leg elevated when sitting to help decrease any swelling and improve circulation/healing.  2. Allergic rhinitis due to other allergic trigger, unspecified seasonality I did tell her that I would review allergy notes, but unfortunately I only have initial visit and not result note in the chart currently.  I will see if are able to get the testing results and recommendations from allergy to review.  She still struggles with allergy symptoms but also has difficulty with medication tolerance.  Follow Up Instructions:  Return if symptoms worsen or fail to improve.   I did not refer this patient for an OV in the next 24 hours for this/these issue(s).  I discussed the assessment and treatment plan with the patient. The patient was provided an  opportunity to ask questions and all were answered. The patient agreed with the plan and demonstrated an understanding of the instructions.   The patient was advised to call back or seek an in-person evaluation if the symptoms worsen or if the condition fails to improve as anticipated.  I provided 20 minutes of non-face-to-face time during this encounter.   Micheline Rough, MD

## 2019-04-26 NOTE — Telephone Encounter (Signed)
See if you can get details about size of areas and amount of swelling/redness? If she is able to do virtual I can fit her in at end of day today if she feels it needs to be seen.

## 2019-04-26 NOTE — Telephone Encounter (Signed)
Copied from [place] [id number]. Topic: General - Other >> Apr 26, 2019 11:25 AM Yvette Rack wrote: Reason for CRM: Pt stated she fell on Friday and scraped her leg in two places. Pt stated she has been putting Neosporin on the areas but it is still red and a little swollen. Pt stated she would like to speak with Wendie Simmer and requests call back.

## 2019-04-28 ENCOUNTER — Telehealth: Payer: Self-pay | Admitting: *Deleted

## 2019-04-28 NOTE — Telephone Encounter (Signed)
Great. I sent you another message. I did see a recent note from Dr. Fredderick Phenix (12/8) but didn't see lab results that he ordered at that time. Can you see if she completed that bloodwork with him? And if so please see if we can get results. Thanks!

## 2019-04-28 NOTE — Telephone Encounter (Signed)
Spoke with Anderson Malta at [employer] [employer] and she stated she will fax the labs to our office.  Message sent to Dr Ethlyn Gallery.

## 2019-04-28 NOTE — Telephone Encounter (Signed)
-----   Message from Caren Macadam, MD sent at 04/28/2019  7:02 AM EST ----- Can you see if dr. Fredderick Phenix office can send over allergy testing results for her? I only have one note visit but no results. She is still having allergy sx but can't tolerate allergy meds. Just wanted to read through their thoughts. Please also touch base with her today (Friday) to see how leg is looking/feeling. If any worse it needs to be seen. Should note some slight improvement from weds.

## 2019-04-28 NOTE — Telephone Encounter (Signed)
Spoke with the pt and she stated the leg feels better and the redness is better as well.  Message sent to Dr Ethlyn Gallery.

## 2019-04-30 NOTE — Telephone Encounter (Signed)
I did review labwork from [facility] and everything was stable. If she is continuing to have "hot/flushing" symptoms in spite of the anti-histamine treatment recommended by allergy, then let me know.

## 2019-05-01 NOTE — Telephone Encounter (Signed)
Patient informed of the message below.  Stated she is not having a hot or flushing sensation as much at this time and agreed to call back if needed.

## 2019-05-02 ENCOUNTER — Other Ambulatory Visit: Payer: Self-pay | Admitting: Family Medicine

## 2019-05-02 NOTE — Telephone Encounter (Signed)
Please check with her on whether she is taking.   *she had stopped because she thought it caused itching *she had restarted at one point and stopped again because she felt dizzy/unwell I believe.  *I believe our last conversation I told her not to take as it seemed to be causing more harm than good and she seems to do better with less meds on board.   So please see what she would like to do/take now. I do not think she needs to be on it right now. I think we can just monitor thyroid for now and keep med list more simple.

## 2019-05-02 NOTE — Telephone Encounter (Signed)
I called the pt and informed her of the message below.  Patient stated she is not taking Levothyroxine at this time.  Message sent to Dr Ethlyn Gallery as Juluis Rainier.

## 2019-05-15 ENCOUNTER — Other Ambulatory Visit: Payer: Medicare Other

## 2019-05-16 DIAGNOSIS — M9903 Segmental and somatic dysfunction of lumbar region: Secondary | ICD-10-CM | POA: Diagnosis not present

## 2019-05-16 DIAGNOSIS — M545 Low back pain: Secondary | ICD-10-CM | POA: Diagnosis not present

## 2019-05-16 DIAGNOSIS — M542 Cervicalgia: Secondary | ICD-10-CM | POA: Diagnosis not present

## 2019-05-16 DIAGNOSIS — M9901 Segmental and somatic dysfunction of cervical region: Secondary | ICD-10-CM | POA: Diagnosis not present

## 2019-05-25 DIAGNOSIS — H04123 Dry eye syndrome of bilateral lacrimal glands: Secondary | ICD-10-CM | POA: Diagnosis not present

## 2019-05-25 DIAGNOSIS — H524 Presbyopia: Secondary | ICD-10-CM | POA: Diagnosis not present

## 2019-05-25 DIAGNOSIS — H353133 Nonexudative age-related macular degeneration, bilateral, advanced atrophic without subfoveal involvement: Secondary | ICD-10-CM | POA: Diagnosis not present

## 2019-05-25 DIAGNOSIS — Z961 Presence of intraocular lens: Secondary | ICD-10-CM | POA: Diagnosis not present

## 2019-05-25 DIAGNOSIS — H401133 Primary open-angle glaucoma, bilateral, severe stage: Secondary | ICD-10-CM | POA: Diagnosis not present

## 2019-05-29 ENCOUNTER — Ambulatory Visit: Payer: Self-pay | Admitting: *Deleted

## 2019-05-29 NOTE — Telephone Encounter (Signed)
Patient given recommendations per Dr. Ethlyn Gallery, patient verbalized understanding. Patient stated that the antihistamine seems to be working so far and she would like to hold off on the referral and virtual visit at this time.

## 2019-05-29 NOTE — Telephone Encounter (Signed)
This is been ongoing intermittently for her.  My only suggestion would be dermatology referral, because allergy did not seem to think that this would be related to prior skin testing.  If she has the ability to do a virtual visit, you could add to Dr. Julianne Rice schedule for tomorrow.

## 2019-05-29 NOTE — Telephone Encounter (Signed)
Message routed to PCP for review.

## 2019-05-29 NOTE — Telephone Encounter (Addendum)
Arms and upper back itching. No hives no rashes.using des loratadine 5 mg several times daily. Arms and legs feel numb once the antihistamine wears off.Has intermittant dizziness she says is from the antihistamine.  Denies difficulty breathing/drooling/swallowing. No tongue thickening. No rash/hives/redness. No fever. Last OV with PCP 12/16. All began after allergy needle test with dermatologist. Recommended discontinue 1% antihistamine, after 4 hours take benadryl 25 mg tab and use Aveeno oatmeal bath for the skin.Routing to PCP for any further advice. Rescheduled Covid 19 vaccine to 06/14/19 @4 :15p at [facility]. Called patient to notify. Reporting the benadryl has decreased the itching. She will continue to take Benadryl 25 mg every 4 hours as needed-drowsiness and dryness discussed. Increasing water intake daily and try Hamel to try. Routing to provider for any further advice.  Reschedule Covid 19 vaccine to 06/14/19@4 :15p at the [facility]   Reason for Disposition . [1] Widespread itching AND [2] cause unknown AND [3] present > 48 hours (Exception: caller knows the cause and can eliminate it)  Answer Assessment - Initial Assessment Questions 1. DESCRIPTION: "Describe the itching you are having."     Itchy arms back and legs 2. SEVERITY: "How bad is it?" taking    - MILD - doesn't interfere with normal activities   - MODERATE-SEVERE: interferes with work, school, sleep, or other activities      Yes, sometimes with sleeping 3. SCRATCHING: "Are there any scratch marks? Bleeding?"    no 4. ONSET: "When did this begin?"      Onset was prior last visit with Dr. Ethlyn Gallery and after the allergy needle test. 5. CAUSE: "What do you think is causing the itching?" (ask about swimming pools, pollen, animals, soaps, etc.)    unsure 6. OTHER SYMPTOMS: "Do you have any other symptoms?"      Arms and legs will feel numb when the antihistamine wears off.  7. PREGNANCY: "Is there any  chance you are pregnant?" "When was your last menstrual period?"     Na  Protocols used: ITCHING [facility]

## 2019-05-30 NOTE — Telephone Encounter (Signed)
Routed to PCP 

## 2019-05-31 ENCOUNTER — Ambulatory Visit: Payer: Medicare Other

## 2019-05-31 ENCOUNTER — Ambulatory Visit: Payer: Self-pay | Admitting: Family Medicine

## 2019-05-31 ENCOUNTER — Telehealth (INDEPENDENT_AMBULATORY_CARE_PROVIDER_SITE_OTHER): Payer: Medicare PPO | Admitting: Family Medicine

## 2019-05-31 ENCOUNTER — Telehealth: Payer: Self-pay | Admitting: *Deleted

## 2019-05-31 ENCOUNTER — Other Ambulatory Visit: Payer: Self-pay

## 2019-05-31 DIAGNOSIS — R232 Flushing: Secondary | ICD-10-CM | POA: Diagnosis not present

## 2019-05-31 DIAGNOSIS — E559 Vitamin D deficiency, unspecified: Secondary | ICD-10-CM | POA: Diagnosis not present

## 2019-05-31 DIAGNOSIS — M6281 Muscle weakness (generalized): Secondary | ICD-10-CM | POA: Diagnosis not present

## 2019-05-31 DIAGNOSIS — E538 Deficiency of other specified B group vitamins: Secondary | ICD-10-CM | POA: Diagnosis not present

## 2019-05-31 DIAGNOSIS — E039 Hypothyroidism, unspecified: Secondary | ICD-10-CM | POA: Diagnosis not present

## 2019-05-31 NOTE — Telephone Encounter (Signed)
Called patient and she reports that last night her skin started to itch and burn. This sensation is over her whole body.  She denies rash.  She states that the skin on her leg that she had a skin tear form a fall looks a little pink arount the healed skin tears. She states she just does not feel well.  She has weak muscles that feel tight in her legs.  She reports 3 loose BMs yesterday.  She has not moved her bowels today. Care advice was read to patient. She states she uses moisturizer only on her face. Call transferred to office for scheduling. Ms Jim is requesting a referral to a dermatologist. Dr Ubaldo Glassing.  Reason for Disposition . [1] MODERATE-SEVERE widespread itching (i.e., interferes with sleep, normal activities or school) AND [2] not improved after 24 hours of itching Care Advice  Answer Assessment - Initial Assessment Questions 1. DESCRIPTION: "Describe the itching you are having."     Itching and burning 2. SEVERITY: "How bad is it?"    - MILD - doesn't interfere with normal activities   - MODERATE-SEVERE: interferes with work, school, sleep, or other activities      Lower legs looked a bit  3. SCRATCHING: "Are there any scratch marks? Bleeding?"    Unsure burning 4. ONSET: "When did this begin?"      Last night woke with extreme burning skin 5. CAUSE: "What do you think is causing the itching?" (ask about swimming pools, pollen, animals, soaps, etc.)     unsure 6. OTHER SYMPTOMS: "Do you have any other symptoms?"     Skin tear using cream 7. PREGNANCY: "Is there any chance you are pregnant?" "When was your last menstrual period?"    N/A  Protocols used: ITCHING [facility]

## 2019-05-31 NOTE — Telephone Encounter (Signed)
Noted. Will discuss with patient this afternoon.

## 2019-05-31 NOTE — Telephone Encounter (Signed)
Spoke with the pt and scheduled a lab appt for tomorrow to arrive at 1:30pm.

## 2019-05-31 NOTE — Progress Notes (Signed)
Virtual Visit via Telephone Note  I connected with [name]  on 05/31/19 at  1:30 PM EST by telephone and verified that I am speaking with the correct person using two identifiers.   I discussed the limitations, risks, security and privacy concerns of performing an evaluation and management service by telephone and the availability of in person appointments. I also discussed with the patient that there may be a patient responsible charge related to this service. The patient expressed understanding and agreed to proceed.  Location patient: home Location provider: work office Participants present for the call: patient, provider Patient did not have a visit in the prior 7 days to address this/these issue(s).   History of Present Illness:  Has been on benadryl since Tuesday. Started at 11:20pm Tuesday. Was having some itching. Woke at 4 and was burning all over body from shoulders down to lower legs. Worse from knee down. Hadn't burned that much previously. Thought that benadryl was helping with itching although burning was there.   Has been sick on stomach now. Very nauseated, but this has improved some. Was able to eat some toast with jelly. But states that nausea is always a first symptom for her. 3 loose stools yesterday  After eating. Not had any bowel movements today. Not watery, but looser. Drinking a lot of water. So urinating a lot. No blood in urine or stools.   States that she was talking to son who went through this earlier this year and was given viral pills (acylovir) for this. She states she would like to try this.   Sore above ankle is still a tiny bit sore; last night felt like it looked pink.   This is similar to sx she had with other burning/itching episodes since allergy testing.   States that she has had some itching daily. Used to just be on back to waist and then left arm (where shots were). But then this morning felt like it had spread down whole body. Face started  itching; head has been itching. Face just more last 4 days. Not had any rash during this time.    Today stated she felt a little numb/not normal feeling. Better today but doesn't feel "normal" yet. Feels like muscles are affected.   Breathing has been ok. Was checking heart beat last night, and felt fast at 4am, but not sure and doesn't feel fast now.   Just dull headache. More mild on benadryl.   Feels weak, nervous all over. More burning inside body. Last night felt like ankles/feet were pink. Couldn't tell if there was flushing elsewhere.   Observations/Objective: Patient sounds anxious but not in distress on the phone. I do not appreciate any SOB. Speech and thought processing are grossly intact. Patient reported vitals: non taken. No fever.   Assessment and Plan:  1. Flushing I am concerned with ongoing flushing/burning sensation of skin that there is more of a systemic process going on.  Although with previous visits and phone calls patient has reported a rash, there has not been a significant rash visualized and on further questioning she states she is having more of a sensation of hot skin.  - 5 HIAA, quantitative, urine, 24 hour; Future - Calcitonin; Future - Urinalysis; Future - Tryptase; Future - Varicella zoster antibody, IgM; Future - ANA; Future - C-reactive protein; Future - Sedimentation rate; Future  2. B12 deficiency - Vitamin B12; Future  3. Vitamin D deficiency - VITAMIN D 25 Hydroxy (Vit-D Deficiency, Fractures);  Future  4. Hypothyroidism, unspecified type - TSH; Future - T4, free; Future - T3, free; Future  5. Muscle weakness - CK; Future   Follow Up Instructions: We will schedule lab visit and follow-up pending these results. Of note, patient really wanted to try acyclovir as she was hoping her symptoms were related to shingles like her son, but I feel this is very unlikely since she has full body flushing and has never had a discrete rash or  sensation along dermatome.  We will check an IgM to ensure no recent zoster infection.  We did get disconnected, but were in process of discussing getting lab work done at that time, so I had Mechele Claude call patient back to set up lab appointment.    I did not refer this patient for an OV in the next 24 hours for this/these issue(s).  I discussed the assessment and treatment plan with the patient. The patient was provided an opportunity to ask questions and all were answered. The patient agreed with the plan and demonstrated an understanding of the instructions.   The patient was advised to call back or seek an in-person evaluation if the symptoms worsen or if the condition fails to improve as anticipated.  I provided 30 minutes of non-face-to-face time during this encounter.   Micheline Rough, MD

## 2019-05-31 NOTE — Telephone Encounter (Signed)
-----   Message from Caren Macadam, MD sent at 05/31/2019  2:09 PM EST ----- Please set up a lab visit for her.

## 2019-06-01 ENCOUNTER — Other Ambulatory Visit: Payer: Self-pay

## 2019-06-01 ENCOUNTER — Other Ambulatory Visit (INDEPENDENT_AMBULATORY_CARE_PROVIDER_SITE_OTHER): Payer: Medicare PPO

## 2019-06-01 DIAGNOSIS — R232 Flushing: Secondary | ICD-10-CM

## 2019-06-01 DIAGNOSIS — E039 Hypothyroidism, unspecified: Secondary | ICD-10-CM | POA: Diagnosis not present

## 2019-06-01 DIAGNOSIS — M6281 Muscle weakness (generalized): Secondary | ICD-10-CM | POA: Diagnosis not present

## 2019-06-01 DIAGNOSIS — E559 Vitamin D deficiency, unspecified: Secondary | ICD-10-CM | POA: Diagnosis not present

## 2019-06-01 DIAGNOSIS — E538 Deficiency of other specified B group vitamins: Secondary | ICD-10-CM | POA: Diagnosis not present

## 2019-06-01 LAB — T4, FREE: Free T4: 0.74 ng/dL (ref 0.60–1.60)

## 2019-06-01 LAB — TSH: TSH: 3.15 u[IU]/mL (ref 0.35–4.50)

## 2019-06-01 LAB — CK: Total CK: 88 U/L (ref 7–177)

## 2019-06-01 LAB — URINALYSIS, ROUTINE W REFLEX MICROSCOPIC
Bilirubin Urine: NEGATIVE
Hgb urine dipstick: NEGATIVE
Ketones, ur: NEGATIVE
Nitrite: NEGATIVE
Specific Gravity, Urine: <=1.005 — AB (ref 1.000–1.030)
Total Protein, Urine: NEGATIVE
Urine Glucose: NEGATIVE
Urobilinogen, UA: 0.2 (ref 0.0–1.0)
pH: 5.5 (ref 5.0–8.0)

## 2019-06-01 LAB — C-REACTIVE PROTEIN: CRP: <1 mg/dL (ref 0.5–20.0)

## 2019-06-01 LAB — VITAMIN B12: Vitamin B-12: 667 pg/mL (ref 211–911)

## 2019-06-01 LAB — VITAMIN D 25 HYDROXY (VIT D DEFICIENCY, FRACTURES): VITD: 36.19 ng/mL (ref 30.00–100.00)

## 2019-06-01 LAB — SEDIMENTATION RATE: Sed Rate: 12 mm/hr (ref 0–30)

## 2019-06-01 LAB — T3, FREE: T3, Free: 2.8 pg/mL (ref 2.3–4.2)

## 2019-06-02 DIAGNOSIS — L98499 Non-pressure chronic ulcer of skin of other sites with unspecified severity: Secondary | ICD-10-CM | POA: Diagnosis not present

## 2019-06-02 DIAGNOSIS — D485 Neoplasm of uncertain behavior of skin: Secondary | ICD-10-CM | POA: Diagnosis not present

## 2019-06-02 DIAGNOSIS — L821 Other seborrheic keratosis: Secondary | ICD-10-CM | POA: Diagnosis not present

## 2019-06-02 DIAGNOSIS — R21 Rash and other nonspecific skin eruption: Secondary | ICD-10-CM | POA: Diagnosis not present

## 2019-06-02 DIAGNOSIS — Z85828 Personal history of other malignant neoplasm of skin: Secondary | ICD-10-CM | POA: Diagnosis not present

## 2019-06-05 ENCOUNTER — Telehealth: Payer: Self-pay | Admitting: Family Medicine

## 2019-06-05 ENCOUNTER — Other Ambulatory Visit: Payer: Self-pay

## 2019-06-05 DIAGNOSIS — E039 Hypothyroidism, unspecified: Secondary | ICD-10-CM | POA: Diagnosis not present

## 2019-06-05 DIAGNOSIS — E559 Vitamin D deficiency, unspecified: Secondary | ICD-10-CM | POA: Diagnosis not present

## 2019-06-05 DIAGNOSIS — M6281 Muscle weakness (generalized): Secondary | ICD-10-CM | POA: Diagnosis not present

## 2019-06-05 DIAGNOSIS — E538 Deficiency of other specified B group vitamins: Secondary | ICD-10-CM | POA: Diagnosis not present

## 2019-06-05 DIAGNOSIS — R232 Flushing: Secondary | ICD-10-CM | POA: Diagnosis not present

## 2019-06-05 LAB — TRYPTASE: Tryptase: 5.4 mcg/L (ref <11.0)

## 2019-06-05 LAB — VARICELLA ZOSTER ANTIBODY, IGM: Varicella Zoster Ab IgM: <=0.9 (ref <=0.90)

## 2019-06-05 LAB — CALCITONIN

## 2019-06-05 LAB — ANA: Anti Nuclear Antibody (ANA): NEGATIVE

## 2019-06-05 NOTE — Telephone Encounter (Signed)
Noted. Will follow up on visit notes

## 2019-06-05 NOTE — Telephone Encounter (Signed)
Patient states she is having a reaction to Antichinergac?-- medication in [place]  She is coming in to see Dr. Elease Hashimoto tomorrow.

## 2019-06-06 ENCOUNTER — Other Ambulatory Visit: Payer: Self-pay

## 2019-06-06 ENCOUNTER — Ambulatory Visit: Payer: Medicare PPO | Admitting: Family Medicine

## 2019-06-06 ENCOUNTER — Encounter: Payer: Self-pay | Admitting: Family Medicine

## 2019-06-06 VITALS — BP 124/76 | HR 94 | Temp 98.2°F | Ht 61.0 in | Wt 115.1 lb

## 2019-06-06 DIAGNOSIS — R208 Other disturbances of skin sensation: Secondary | ICD-10-CM

## 2019-06-06 DIAGNOSIS — S81801D Unspecified open wound, right lower leg, subsequent encounter: Secondary | ICD-10-CM | POA: Diagnosis not present

## 2019-06-06 DIAGNOSIS — F419 Anxiety disorder, unspecified: Secondary | ICD-10-CM

## 2019-06-06 NOTE — Patient Instructions (Signed)
I will discuss treatment options with Dr Ethlyn Gallery to see if we can help with your burning symptoms

## 2019-06-06 NOTE — Progress Notes (Signed)
Subjective:     Patient ID: Heidi Moore, female   DOB: Nov 24, 1919, 86 y.o.   MRN: [id number]  HPI Heidi Moore is a 86 year old female who apparently had several months of intermittent "burning" type sensation involving much of her trunk in somewhat of a migratory fashion.  She states that she had allergy testing with skin testing back in the fall and she attributes onset of symptoms following that.  Apparently, there has not been any visible rash.  She had at one point complained of flushing but she states this is actually not flushing but more of a burning symptom.  These are somewhat intermittent.  She has some itching as well.  She has seen allergist in follow-up.  She took some Benadryl for a while with temporary improvement.  She actually had multiple labs done here recently which were reviewed and these were done to look for causes of flushing.  Her thyroid functions were normal.  Inflammatory markers were normal.  5-hydroxyindoleacetic acid levels pending.  Other labs basically normal.  B12 normal.  She currently uses Dove soap.  Occasional moisturizer use.  Other issue is she had a fall December 11.  She states that she had a injury to her right leg from [employer].  She is currently using colloidal dressing and this is slowly healing.  She had a right thigh wound which apparently is healed up.  Past Medical History:  Diagnosis Date  . AC (acromioclavicular) joint bone spurs    on right shoulder   . Allergic rhinitis   . Arthritis   . Bursitis    right shoulder  . Complication of anesthesia    headaches  . Diverticulosis   . DIVERTICULOSIS, COLON 10/15/2006   Qualifier: Diagnosis of  By: Linna Darner MD, Gwyndolyn Saxon Eye problem    treated by Dr Kathlen Mody per patient  . Headache(784.0)    sinus HA  . History of blood transfusion 20yrs ago   no abnormal reaction noted  . History of cystitis   . History of skin cancer    Basal cell, Dr. Lindwood Coke  . Hyperlipidemia    [place]  Study LDL goal =<160   . Hypotension   . Joint pain   . Joint swelling   . Macular degeneration, dry   . Osteopenia    Intolerance to Calcium,Actonel,Fosamax,Evista, Forteo: S/P Boniva 2007 to 03/2010  . OSTEOPENIA 03/12/2008   Qualifier: Diagnosis of  By: Velora Heckler    . Osteoporosis   . PONV (postoperative nausea and vomiting)   . Rosacea 04/22/2009   Qualifier: Diagnosis of  By: Linna Darner MD, Gwyndolyn Saxon    . Skin cancer 06/22/2017   per patient after excision right calf by Dr Ubaldo Glassing  . SKIN CANCER, HX OF 03/12/2008   Qualifier: Diagnosis of  By: Velora Heckler    . Urinary frequency   . Vaginal atrophy    sees Dr. Gertie Fey   Past Surgical History:  Procedure Laterality Date  . APPENDECTOMY    . BUNIONECTOMY Bilateral   . CATARACT EXTRACTION, BILATERAL    . COLONOSCOPY     with Tics (no further f/u as per GI due to age)   . KNEE ARTHROPLASTY  05/18/2012   Procedure: COMPUTER ASSISTED TOTAL KNEE ARTHROPLASTY;  Surgeon: Marybelle Killings, MD;  Location: [place];  Service: Orthopedics;  Laterality: Left;  Left Total Knee Arthroplasty, Cemented, Computer Assisted  . lt knee      meniscus tear  . MOHS  SURGERY     right nostril for basel cell cancer   . NOSE SURGERY     due to trama at age 36   . TOTAL ABDOMINAL HYSTERECTOMY W/ BILATERAL SALPINGOOPHORECTOMY     for Endometriosis   . TOTAL SHOULDER ARTHROPLASTY Right 09/11/2013   Procedure: TOTAL SHOULDER ARTHROPLASTY;  Surgeon: Marybelle Killings, MD;  Location: [place];  Service: Orthopedics;  Laterality: Right;  Right Total Shoulder Arthroplasty    reports that she has never smoked. She has never used smokeless tobacco. She reports that she does not drink alcohol or use drugs. family history includes Atrial fibrillation in her brother; Colon cancer in an other family member; Diabetes in her brother; Lung cancer in her sister; Stroke (age of onset: 45) in her mother. Allergies  Allergen Reactions  . Alendronate Sodium Nausea And Vomiting  . Anaplex  Hd Nausea And Vomiting  . Aspirin Nausea And Vomiting  . Azithromycin Nausea And Vomiting  . Calcium Nausea And Vomiting  . Ciprofloxacin     Patient states this caused nausea, dizziness and affected her muscles  . Codeine Nausea And Vomiting  . Doxycycline Nausea And Vomiting  . Hydrocodone Nausea And Vomiting  . Ibandronate Sodium     REACTION: severe body aches  . Ibuprofen Nausea And Vomiting  . Penicillins Nausea And Vomiting  . Raloxifene Nausea And Vomiting  . Risedronate Sodium Nausea And Vomiting  . Sulfonamide Derivatives Nausea And Vomiting  . Teriparatide Nausea And Vomiting   Wt Readings from Last 3 Encounters:  06/06/19 115 lb 1.6 oz (52.2 kg)  03/24/19 116 lb 4.8 oz (52.8 kg)  11/01/18 112 lb 6 oz (51 kg)      Review of Systems  Constitutional: Negative for chills and fever.  Respiratory: Negative for cough and shortness of breath.   Cardiovascular: Negative for chest pain.  Gastrointestinal: Negative for abdominal pain, nausea and vomiting.  Genitourinary: Negative for dysuria.  Skin: Negative for rash.  Neurological: Negative for dizziness, weakness and headaches.  Hematological: Negative for adenopathy.  Psychiatric/Behavioral: Negative for confusion.       Objective:   Physical Exam Vitals reviewed.  Constitutional:      Appearance: Normal appearance.  Cardiovascular:     Rate and Rhythm: Normal rate and regular rhythm.  Pulmonary:     Effort: Pulmonary effort is normal.     Breath sounds: Normal breath sounds.  Musculoskeletal:     Right lower leg: No edema.     Left lower leg: No edema.  Skin:    Comments: She has very superficial wound right lower leg.  She has colloidal dressing in place currently.  This appears to be granulating in and healing well.  There is no surrounding erythema.  No necrosis.  No purulent secretions.  Neurological:     Mental Status: She is alert.        Assessment:     #1 slow healing wound right leg from  injury back in December.  No signs of secondary infection  #2 intermittent dysesthesias with "burning "quality of skin symptoms that is very intermittent and somewhat migratory.  Patient is convinced this followed her allergy testing will explained we are not sure this is related.  She has had extensive lab work as above thus far unrevealing.  24-hour urine for 5-hydroxyindoleacetic acid pending.  She is describing more of a burning dysesthesia than flushing and no consistent diarrhea, abdominal cramping, or other highly suspicious symptoms for carcinoid  Plan:     -Follow-up pending labs as above -Patient will consider trial of Zyrtec 10 mg 1 daily -Patient seems to have a lot of anxiety symptoms regarding physical symptoms above.  Question whether she would benefit from very low-dose Lexapro such as 5 mg daily Will discuss with primary  Eulas Post MD [facility]

## 2019-06-08 ENCOUNTER — Telehealth: Payer: Self-pay | Admitting: Family Medicine

## 2019-06-08 NOTE — Telephone Encounter (Signed)
No answer at the pts home number. 

## 2019-06-08 NOTE — Telephone Encounter (Signed)
Pt is requesting to speak to you regarding test results. Pt had blood and urine test done last week. Thanks

## 2019-06-08 NOTE — Telephone Encounter (Signed)
I was waiting until I had all bloodwork results. Not sure if she completed the 24 hr urine for the 5HIAA? Looks like it hasn't been collected yet. Everything else has looked good so far. Please let me know if she did not complete the urine test in which case we can review labs sooner. Otherwise I would like to wait to have this complete picture if she is ok with that.

## 2019-06-09 ENCOUNTER — Ambulatory Visit: Payer: Medicare PPO

## 2019-06-11 DIAGNOSIS — R21 Rash and other nonspecific skin eruption: Secondary | ICD-10-CM | POA: Diagnosis not present

## 2019-06-11 DIAGNOSIS — L298 Other pruritus: Secondary | ICD-10-CM | POA: Diagnosis not present

## 2019-06-12 NOTE — Telephone Encounter (Signed)
Pt is calling in to check on her labs and would like to have a call back today due to her not having a computer.  Pt stated it is very important that she receive a call back today.

## 2019-06-13 ENCOUNTER — Telehealth: Payer: Self-pay | Admitting: Family Medicine

## 2019-06-13 ENCOUNTER — Other Ambulatory Visit: Payer: Self-pay

## 2019-06-13 ENCOUNTER — Telehealth (INDEPENDENT_AMBULATORY_CARE_PROVIDER_SITE_OTHER): Payer: Medicare PPO | Admitting: Family Medicine

## 2019-06-13 ENCOUNTER — Encounter: Payer: Self-pay | Admitting: Family Medicine

## 2019-06-13 VITALS — BP 130/88

## 2019-06-13 DIAGNOSIS — F439 Reaction to severe stress, unspecified: Secondary | ICD-10-CM | POA: Diagnosis not present

## 2019-06-13 DIAGNOSIS — R6889 Other general symptoms and signs: Secondary | ICD-10-CM | POA: Diagnosis not present

## 2019-06-13 DIAGNOSIS — R448 Other symptoms and signs involving general sensations and perceptions: Secondary | ICD-10-CM

## 2019-06-13 DIAGNOSIS — L853 Xerosis cutis: Secondary | ICD-10-CM

## 2019-06-13 DIAGNOSIS — R208 Other disturbances of skin sensation: Secondary | ICD-10-CM

## 2019-06-13 NOTE — Telephone Encounter (Signed)
pt would like to know  the results of her Urinalysis and labs, done on 05/31/2018; patient contact number [phone]

## 2019-06-13 NOTE — Telephone Encounter (Signed)
Pt would like a referral for Cardiologist to make sure she doesn't have an irregular heart beat.   Pt would like to be called back at [phone]

## 2019-06-13 NOTE — Telephone Encounter (Signed)
I called the pt and informed her of the message below.  I spoke with Ada at [facility]

## 2019-06-13 NOTE — Progress Notes (Signed)
Virtual Visit via Telephone Note  I connected with Heidi Moore on 06/13/19 at 10:40 AM EST by telephone and verified that I am speaking with the correct person using two identifiers.   I discussed the limitations, risks, security and privacy concerns of performing an evaluation and management service by telephone and the availability of in person appointments. I also discussed with the patient that there may be a patient responsible charge related to this service. The patient expressed understanding and agreed to proceed.  Location patient: home Location provider: work or home office Participants present for the call: patient, provider Patient did not have a visit in the prior 7 days to address this/these issue(s).   History of Present Illness:  Acute visit for Hypertension: -reports checked her blood pressure with her neighbors cuff - was around 150/90s -130/88 after recheck -denies CP, SOB, palpitations  -she has been really stressed out because of itchy dry skin and feeling hot and cold in her skin all over for several months -she has seen the allergist and her PCP for this - PCP did extensive lab evaluation for flushing -thyroid has been mildly off, but pt did not take the thyroid medication because she felt it made her nauseous -she was told to take antihistamine from the allergist, but she doesn't like how it makes her feel -she is very frustrated with this burning dry skin    Observations/Objective: Patient sounds cheerful and well on the phone. I do not appreciate any SOB. Speech and thought processing are grossly intact. Patient reported vitals:  Assessment and Plan:  Sensation of both heat and cold  Xerosis of skin  Burning sensation of skin  Stress  -we discussed possible serious and likely etiologies, options for evaluation and workup, limitations of telemedicine visit vs in person visit, treatment, treatment risks and precautions. Pt prefers to treat via  telemedicine empirically rather then risking or undertaking an in person visit at this moment. Had a very lengthy discussion of her symptoms and possible causes and treatment. She is very anxious and stressed out about the dry and uncomfortable skin. Advised trial cerave cream or aquaphor 1-2 times daily, topical hct cr (as needed as she felt it helped - ut not more than 1-2 times daily), switching to hypoallergenic regimen and laundry detergent. Also, trial 1/2 of her thyroid medication for 6 weeks to see if this helps. She was on a very low dose, but reports did not take - yet has lots of remaining pills. Advised follow up in 4-6 weeks with PCP or myself. Consider adding low dose treatment for anxiety if persists. Monitor BP, though seems may be stress related. Patient agrees to seek prompt in person care if worsening, new symptoms arise, or if is not improving with treatment.  Follow Up Instructions:  I did not refer this patient for an OV in the next 24 hours for this/these issue(s).  I discussed the assessment and treatment plan with the patient. The patient was provided an opportunity to ask questions and all were answered. The patient agreed with the plan and demonstrated an understanding of the instructions.   The patient was advised to call back or seek an in-person evaluation if the symptoms worsen or if the condition fails to improve as anticipated.  I provided 40 minutes of non-face-to-face time during this encounter.   Lucretia Kern, DO

## 2019-06-13 NOTE — Telephone Encounter (Signed)
Heidi Moore is going to fax the results of the 24 hour urine to the office and the pt is aware she will be contacted with results once reviewed by Dr Ethlyn Gallery.  Patient stated she is concerned about her elevated BP reading this AM of 159/83 and still has the flushing sensation.  Phone visit scheduled with Dr Maudie Mercury as PCP is out of the office.

## 2019-06-13 NOTE — Telephone Encounter (Signed)
See previous phone note.  

## 2019-06-13 NOTE — Telephone Encounter (Signed)
No answer at the pts home number. 

## 2019-06-14 ENCOUNTER — Other Ambulatory Visit: Payer: Self-pay

## 2019-06-14 ENCOUNTER — Ambulatory Visit: Payer: Medicare PPO

## 2019-06-14 ENCOUNTER — Encounter: Payer: Self-pay | Admitting: Family Medicine

## 2019-06-14 ENCOUNTER — Ambulatory Visit: Payer: Medicare PPO | Admitting: Family Medicine

## 2019-06-14 VITALS — BP 140/80 | HR 76 | Temp 97.5°F | Wt 117.7 lb

## 2019-06-14 DIAGNOSIS — R030 Elevated blood-pressure reading, without diagnosis of hypertension: Secondary | ICD-10-CM | POA: Diagnosis not present

## 2019-06-14 DIAGNOSIS — R002 Palpitations: Secondary | ICD-10-CM

## 2019-06-14 DIAGNOSIS — R208 Other disturbances of skin sensation: Secondary | ICD-10-CM

## 2019-06-14 NOTE — Telephone Encounter (Signed)
Just fyi; multiple messages on Heidi Moore. Her urine 5HIAA was negative. She also had visit with Dr. Maudie Mercury yesterday.

## 2019-06-14 NOTE — Telephone Encounter (Signed)
I called the pt and informed her of the message below.  Patient was scheduled for an appt today with Dr Elease Hashimoto for evaluation and is aware a copy of the results will be left at the front desk for pick up.

## 2019-06-14 NOTE — Telephone Encounter (Signed)
*  If she is concerned for an irregular heartbeat, we need to have her seen in the office for [place] to evaluate since it can take some time to get in with cardiology.  Please get more information and see if we can set up a visit.  *I also just got her urine results back from the 24-hour urine, and it looked normal, which is good.  This means though that we need to discuss her recurring "burning skin" sensation symptoms.  I would like to talk through treatment options with her.  She will need a 30-minute visit

## 2019-06-14 NOTE — Patient Instructions (Signed)
Your blood pressure is good today  Stay well hydrated.    You are OK to get your Covid vaccine tomorrow.

## 2019-06-14 NOTE — Telephone Encounter (Signed)
Reviewed Dr. Julianne Rice note.  I have sent another message this morning on patient.

## 2019-06-14 NOTE — Progress Notes (Signed)
Subjective:     Patient ID: [name], female   DOB: 1919/06/26, 86 y.o.   MRN: [id number]  HPI   Ms. Jarnagin was scheduled to see me again today as a work in.  Refer to multiple recent notes.  She had called yesterday with concerns for "irregular" heartbeat.  She also states that she feels "off balance "at times.  This does not sound like any clear positional or orthostatic type symptoms.  They have been monitoring her blood pressure and she had one reading as high as [id number] systolic which is very atypical for her.  No chest pains.  No syncope.  She did not actually check her pulse.  She has been having months now of vague dysesthesias involving her extremities and she states this started after allergy skin testing.  She has frequent pruritus of her skin.  She was advised yesterday to consider starting back half a thyroid pill and she did take a half a tablet yesterday.  Recent thyroid functions were normal.  She had multiple other labs done recently and these were again reviewed with patient and daughter today.  Inflammatory markers were unremarkable.  There have been some discussion that at least some of her symptoms seem to be related to anxiety.  Her appetite and weight are stable.  Wt Readings from Last 3 Encounters:  06/14/19 117 lb 11.2 oz (53.4 kg)  06/06/19 115 lb 1.6 oz (52.2 kg)  03/24/19 116 lb 4.8 oz (52.8 kg)     Past Medical History:  Diagnosis Date  . AC (acromioclavicular) joint bone spurs    on right shoulder   . Allergic rhinitis   . Arthritis   . Bursitis    right shoulder  . Complication of anesthesia    headaches  . Diverticulosis   . DIVERTICULOSIS, COLON 10/15/2006   Qualifier: Diagnosis of  By: Linna Darner MD, Gwyndolyn Saxon Eye problem    treated by Dr Kathlen Mody per patient  . Headache(784.0)    sinus HA  . History of blood transfusion 52yrs ago   no abnormal reaction noted  . History of cystitis   . History of skin cancer    Basal cell, Dr. Lindwood Coke  .  Hyperlipidemia    [place] Study LDL goal =<160   . Hypotension   . Joint pain   . Joint swelling   . Macular degeneration, dry   . Osteopenia    Intolerance to Calcium,Actonel,Fosamax,Evista, Forteo: S/P Boniva 2007 to 03/2010  . OSTEOPENIA 03/12/2008   Qualifier: Diagnosis of  By: Velora Heckler    . Osteoporosis   . PONV (postoperative nausea and vomiting)   . Rosacea 04/22/2009   Qualifier: Diagnosis of  By: Linna Darner MD, Gwyndolyn Saxon    . Skin cancer 06/22/2017   per patient after excision right calf by Dr Ubaldo Glassing  . SKIN CANCER, HX OF 03/12/2008   Qualifier: Diagnosis of  By: Velora Heckler    . Urinary frequency   . Vaginal atrophy    sees Dr. Gertie Fey   Past Surgical History:  Procedure Laterality Date  . APPENDECTOMY    . BUNIONECTOMY Bilateral   . CATARACT EXTRACTION, BILATERAL    . COLONOSCOPY     with Tics (no further f/u as per GI due to age)   . KNEE ARTHROPLASTY  05/18/2012   Procedure: COMPUTER ASSISTED TOTAL KNEE ARTHROPLASTY;  Surgeon: Marybelle Killings, MD;  Location: [place];  Service: Orthopedics;  Laterality: Left;  Left Total Knee  Arthroplasty, Cemented, Computer Assisted  . lt knee      meniscus tear  . MOHS SURGERY     right nostril for basel cell cancer   . NOSE SURGERY     due to trama at age 70   . TOTAL ABDOMINAL HYSTERECTOMY W/ BILATERAL SALPINGOOPHORECTOMY     for Endometriosis   . TOTAL SHOULDER ARTHROPLASTY Right 09/11/2013   Procedure: TOTAL SHOULDER ARTHROPLASTY;  Surgeon: Marybelle Killings, MD;  Location: [place];  Service: Orthopedics;  Laterality: Right;  Right Total Shoulder Arthroplasty    reports that she has never smoked. She has never used smokeless tobacco. She reports that she does not drink alcohol or use drugs. family history includes Atrial fibrillation in her brother; Colon cancer in an other family member; Diabetes in her brother; Lung cancer in her sister; Stroke (age of onset: 72) in her mother. Allergies  Allergen Reactions  . Alendronate Sodium  Nausea And Vomiting  . Anaplex Hd Nausea And Vomiting  . Aspirin Nausea And Vomiting  . Azithromycin Nausea And Vomiting  . Calcium Nausea And Vomiting  . Ciprofloxacin     Patient states this caused nausea, dizziness and affected her muscles  . Codeine Nausea And Vomiting  . Doxycycline Nausea And Vomiting  . Hydrocodone Nausea And Vomiting  . Ibandronate Sodium     REACTION: severe body aches  . Ibuprofen Nausea And Vomiting  . Penicillins Nausea And Vomiting  . Raloxifene Nausea And Vomiting  . Risedronate Sodium Nausea And Vomiting  . Sulfonamide Derivatives Nausea And Vomiting  . Teriparatide Nausea And Vomiting     Review of Systems  Constitutional: Negative for appetite change, chills, fever and unexpected weight change.  Respiratory: Negative for cough and shortness of breath.   Cardiovascular: Positive for palpitations. Negative for chest pain and leg swelling.  Genitourinary: Negative for dysuria.  Neurological: Negative for headaches.  Psychiatric/Behavioral: Negative for agitation and confusion. The patient is nervous/anxious.        Objective:   Physical Exam Vitals reviewed.  Constitutional:      Appearance: Normal appearance.  Cardiovascular:     Rate and Rhythm: Normal rate and regular rhythm.     Heart sounds: No gallop.   Pulmonary:     Effort: Pulmonary effort is normal.     Breath sounds: Normal breath sounds.  Musculoskeletal:     Right lower leg: No edema.     Left lower leg: No edema.  Neurological:     General: No focal deficit present.     Mental Status: She is alert and oriented to person, place, and time.     Cranial Nerves: No cranial nerve deficit.     Motor: No weakness.     Comments: He is able to transfer from chair to table without any assistance.        Assessment:     #1 patient complains of some subjective palpitations but has completely normal heart exam and regular rhythm and rate at this time.  Recent thyroid functions  normal.  #2 persistent vague dysesthesias.  Recent B12 testing normal.  #3 reported recent elevated blood pressure.  By my check today 132/60 seated and 120/60 standing    Plan:     -Reassurance regarding blood pressure -We reviewed labs with patient and daughter in some detail and these were basically reassuring -We have not recommended going back on thyroid replacement at this time especially in view of her palpitations -We discussed possible event monitor  if palpitations persist but at this point she has normal exam -Continue to avoid caffeine  Eulas Post MD [facility]

## 2019-06-15 ENCOUNTER — Ambulatory Visit: Payer: Medicare PPO | Attending: Internal Medicine

## 2019-06-15 DIAGNOSIS — Z23 Encounter for immunization: Secondary | ICD-10-CM | POA: Insufficient documentation

## 2019-06-15 NOTE — Progress Notes (Signed)
   Covid-19 Vaccination Clinic  Name:  [name]    MRN: [id number] DOB: Nov 29, 1919  06/15/2019  [name] was observed post Covid-19 immunization for 15 minutes without incidence. She was provided with Vaccine Information Sheet and instruction to access the V-Safe system.   [name] was instructed to call 911 with any severe reactions post vaccine: [name] Difficulty breathing  . Swelling of your face and throat  . A fast heartbeat  . A bad rash all over your body  . Dizziness and weakness    Immunizations Administered    Name Date Dose VIS Date Route   Pfizer COVID-19 Vaccine 06/15/2019 11:46 AM 0.3 mL 04/21/2019 Intramuscular   Manufacturer: [place]   Lot: [id number]   [place]: [id number]

## 2019-06-18 ENCOUNTER — Emergency Department (HOSPITAL_COMMUNITY): Payer: Medicare PPO

## 2019-06-18 ENCOUNTER — Emergency Department (HOSPITAL_COMMUNITY)
Admission: EM | Admit: 2019-06-18 | Discharge: 2019-06-18 | Disposition: A | Discharge status: Home / Self Care | Payer: Medicare PPO | Attending: Emergency Medicine | Admitting: Emergency Medicine

## 2019-06-18 ENCOUNTER — Encounter (HOSPITAL_COMMUNITY): Payer: Self-pay | Admitting: Emergency Medicine

## 2019-06-18 ENCOUNTER — Other Ambulatory Visit: Payer: Self-pay

## 2019-06-18 DIAGNOSIS — E039 Hypothyroidism, unspecified: Secondary | ICD-10-CM | POA: Insufficient documentation

## 2019-06-18 DIAGNOSIS — N3000 Acute cystitis without hematuria: Secondary | ICD-10-CM | POA: Insufficient documentation

## 2019-06-18 DIAGNOSIS — Z79899 Other long term (current) drug therapy: Secondary | ICD-10-CM | POA: Insufficient documentation

## 2019-06-18 DIAGNOSIS — Z96652 Presence of left artificial knee joint: Secondary | ICD-10-CM | POA: Diagnosis not present

## 2019-06-18 DIAGNOSIS — Z85828 Personal history of other malignant neoplasm of skin: Secondary | ICD-10-CM | POA: Diagnosis not present

## 2019-06-18 DIAGNOSIS — R42 Dizziness and giddiness: Secondary | ICD-10-CM | POA: Diagnosis not present

## 2019-06-18 DIAGNOSIS — R0989 Other specified symptoms and signs involving the circulatory and respiratory systems: Secondary | ICD-10-CM | POA: Insufficient documentation

## 2019-06-18 DIAGNOSIS — R519 Headache, unspecified: Secondary | ICD-10-CM | POA: Insufficient documentation

## 2019-06-18 DIAGNOSIS — R200 Anesthesia of skin: Secondary | ICD-10-CM | POA: Diagnosis not present

## 2019-06-18 LAB — COMPREHENSIVE METABOLIC PANEL
ALT: 18 U/L (ref 0–44)
AST: 20 U/L (ref 15–41)
Albumin: 3.9 g/dL (ref 3.5–5.0)
Alkaline Phosphatase: 44 U/L (ref 38–126)
Anion gap: 12 (ref 5–15)
BUN: 11 mg/dL (ref 8–23)
CO2: 23 mmol/L (ref 22–32)
Calcium: 9.3 mg/dL (ref 8.9–10.3)
Chloride: 106 mmol/L (ref 98–111)
Creatinine, Ser: 0.88 mg/dL (ref 0.44–1.00)
GFR calc Af Amer: >60 mL/min (ref >60)
GFR calc non Af Amer: 55 mL/min — ABNORMAL LOW (ref >60)
Glucose, Bld: 91 mg/dL (ref 70–99)
Potassium: 3.6 mmol/L (ref 3.5–5.1)
Sodium: 141 mmol/L (ref 135–145)
Total Bilirubin: 0.8 mg/dL (ref 0.3–1.2)
Total Protein: 6.6 g/dL (ref 6.5–8.1)

## 2019-06-18 LAB — DIFFERENTIAL
Abs Immature Granulocytes: 0.02 10*3/uL (ref 0.00–0.07)
Basophils Absolute: 0.1 10*3/uL (ref 0.0–0.1)
Basophils Relative: 1 %
Eosinophils Absolute: 0.1 10*3/uL (ref 0.0–0.5)
Eosinophils Relative: 2 %
Immature Granulocytes: 0 %
Lymphocytes Relative: 20 %
Lymphs Abs: 1.2 10*3/uL (ref 0.7–4.0)
Monocytes Absolute: 0.5 10*3/uL (ref 0.1–1.0)
Monocytes Relative: 9 %
Neutro Abs: 4.1 10*3/uL (ref 1.7–7.7)
Neutrophils Relative %: 68 %

## 2019-06-18 LAB — URINALYSIS, ROUTINE W REFLEX MICROSCOPIC
Bilirubin Urine: NEGATIVE
Glucose, UA: NEGATIVE mg/dL
Hgb urine dipstick: NEGATIVE
Ketones, ur: NEGATIVE mg/dL
Nitrite: NEGATIVE
Protein, ur: NEGATIVE mg/dL
Specific Gravity, Urine: 1.004 — ABNORMAL LOW (ref 1.005–1.030)
pH: 5 (ref 5.0–8.0)

## 2019-06-18 LAB — CBC
HCT: 42.3 % (ref 36.0–46.0)
Hemoglobin: 13.9 g/dL (ref 12.0–15.0)
MCH: 30.4 pg (ref 26.0–34.0)
MCHC: 32.9 g/dL (ref 30.0–36.0)
MCV: 92.6 fL (ref 80.0–100.0)
Platelets: 175 10*3/uL (ref 150–400)
RBC: 4.57 MIL/uL (ref 3.87–5.11)
RDW: 13.4 % (ref 11.5–15.5)
WBC: 6.1 10*3/uL (ref 4.0–10.5)
nRBC: 0 % (ref 0.0–0.2)

## 2019-06-18 LAB — PROTIME-INR
INR: 1.1 (ref 0.8–1.2)
Prothrombin Time: 13.6 seconds (ref 11.4–15.2)

## 2019-06-18 LAB — APTT: aPTT: 31 seconds (ref 24–36)

## 2019-06-18 MED ORDER — SODIUM CHLORIDE 0.9% FLUSH: 3.0000 mL | Freq: Once | INTRAVENOUS | Status: DC

## 2019-06-18 MED ORDER — ACETAMINOPHEN 325 MG PO TABS
650.0000 mg | ORAL_TABLET | Freq: Once | ORAL | Status: AC
  Administered 2019-06-18: 650 mg via ORAL
  Filled 2019-06-18: qty 2

## 2019-06-18 MED ORDER — NITROFURANTOIN MONOHYD MACRO 100 MG PO CAPS: 100.0000 mg | ORAL_CAPSULE | Freq: Two times a day (BID) | ORAL | 0 refills | Status: DC

## 2019-06-18 MED ORDER — NITROFURANTOIN MONOHYD MACRO 100 MG PO CAPS: 100.0000 mg | ORAL_CAPSULE | Freq: Once | ORAL | Status: DC

## 2019-06-18 NOTE — ED Notes (Signed)
Pt repeats she had allergy shots in Sept 2020 and is having a reaction to the allergy shots. Pt reports she feels flushed and then her leg muscles tighten up. . Pt denies any numbness to legs . Pt has full range of motion to bil arms and legs

## 2019-06-18 NOTE — Discharge Instructions (Signed)
Your work-up today is very reassuring the scan of your head and your lab work overall look good.  Your urine did show signs of infection, please take Macrobid twice a day, it looks like you have tolerated this medication well in the past for UTIs.  Please follow-up closely with your primary care doctor to ensure your symptoms are improving.  I think that some of the symptoms may be a reaction from recent Covid vaccine, this is normal and means that your immune system is having a response to the vaccine.  This should not prohibit you from getting her second vaccine.  Please discuss this with your PCP.

## 2019-06-18 NOTE — ED Provider Notes (Signed)
[facility] EMERGENCY DEPARTMENT Provider Note   CSN: [id number] Arrival date & time: 06/18/19  0847     History Chief Complaint  Patient presents with  . Headache  . Dizziness    Heidi Moore is a 86 y.o. female.  Heidi Moore is a 86 y.o. female with a history of peripheral vertigo, sinus headaches, additional medical history noted below, who presents to the ED for evaluation of headache and dizziness.  She reports that symptoms began on Saturday, a day after she received her first Covid vaccine.  She reports she first noted a mild frontal headache, this feels similar to sinus headache she has had in the past.  Headache was not associated with any vision changes, nausea, vomiting, numbness or weakness.  But she did begin experiencing some dizziness, reports she was feeling off balance.  Has not fallen.  States this feels similar to peripheral vertigo that she had before, she underwent vestibular rehab and Ward exercises, did some of these last night for her symptoms with improvement.  Vertigo is not persistent she has not had any associated nausea or vomiting.  She does also report a flushing sensation in her chest that goes down to her legs and she feels a tightness and burning in her legs sometimes.  She has had the symptoms intermittently ever since she underwent allergy testing in September, has been following with her PCP regarding the symptoms and has had lab work done which she brought in for review which has all been reassuring.  She is not sure what is causing flushing symptoms but she has been experiencing it more so in the last 2 days ever since receiving her Covid vaccine.  She has not had any fevers.  No lightheadedness or syncope.  No chest pain, shortness of breath or palpitations.  No abdominal pain.  Notes a tightness and soreness in her legs but denies any lower extremity swelling.        Past Medical History:  Diagnosis Date  . AC (acromioclavicular)  joint bone spurs    on right shoulder   . Allergic rhinitis   . Arthritis   . Bursitis    right shoulder  . Complication of anesthesia    headaches  . Diverticulosis   . DIVERTICULOSIS, COLON 10/15/2006   Qualifier: Diagnosis of  By: Linna Darner MD, Gwyndolyn Saxon Eye problem    treated by Dr Kathlen Mody per patient  . Headache(784.0)    sinus HA  . History of blood transfusion 38yrs ago   no abnormal reaction noted  . History of cystitis   . History of skin cancer    Basal cell, Dr. Lindwood Coke  . Hyperlipidemia    [place] Study LDL goal =<160   . Hypotension   . Joint pain   . Joint swelling   . Macular degeneration, dry   . Osteopenia    Intolerance to Calcium,Actonel,Fosamax,Evista, Forteo: S/P Boniva 2007 to 03/2010  . OSTEOPENIA 03/12/2008   Qualifier: Diagnosis of  By: Velora Heckler    . Osteoporosis   . PONV (postoperative nausea and vomiting)   . Rosacea 04/22/2009   Qualifier: Diagnosis of  By: Linna Darner MD, Gwyndolyn Saxon    . Skin cancer 06/22/2017   per patient after excision right calf by Dr Ubaldo Glassing  . SKIN CANCER, HX OF 03/12/2008   Qualifier: Diagnosis of  By: Velora Heckler    . Urinary frequency   . Vaginal atrophy    sees  Dr. Gertie Fey    Patient Active Problem List   Diagnosis Date Noted  . Rosacea 02/19/2014  . Osteoarthritis of right shoulder 09/11/2013    Class: Diagnosis of  . Macular degeneration - followed by optho, Dr. Brandy Hale 08/24/2012  . Hot flashes - followed by gyn, Dr. Gertie Fey 08/24/2012  . Osteoarthritis of left knee 05/17/2012    Class: Diagnosis of  . LOW BACK PAIN SYNDROME 05/07/2010  . VITAMIN D DEFICIENCY 04/22/2009  . Allergic rhinitis 03/12/2008  . Hyperlipemia 10/22/2006    Past Surgical History:  Procedure Laterality Date  . APPENDECTOMY    . BUNIONECTOMY Bilateral   . CATARACT EXTRACTION, BILATERAL    . COLONOSCOPY     with Tics (no further f/u as per GI due to age)   . KNEE ARTHROPLASTY  05/18/2012   Procedure: COMPUTER ASSISTED TOTAL  KNEE ARTHROPLASTY;  Surgeon: Marybelle Killings, MD;  Location: [place];  Service: Orthopedics;  Laterality: Left;  Left Total Knee Arthroplasty, Cemented, Computer Assisted  . lt knee      meniscus tear  . MOHS SURGERY     right nostril for basel cell cancer   . NOSE SURGERY     due to trama at age 45   . TOTAL ABDOMINAL HYSTERECTOMY W/ BILATERAL SALPINGOOPHORECTOMY     for Endometriosis   . TOTAL SHOULDER ARTHROPLASTY Right 09/11/2013   Procedure: TOTAL SHOULDER ARTHROPLASTY;  Surgeon: Marybelle Killings, MD;  Location: [place];  Service: Orthopedics;  Laterality: Right;  Right Total Shoulder Arthroplasty     OB History   No obstetric history on file.     Family History  Problem Relation Age of Onset  . Colon cancer Other        1st Cousin   . Stroke Mother 6  . Diabetes Brother   . Atrial fibrillation Brother        coumadin  . Lung cancer Sister     Social History   Tobacco Use  . Smoking status: Never Smoker  . Smokeless tobacco: Never Used  Substance Use Topics  . Alcohol use: No  . Drug use: No    Home Medications Prior to Admission medications   Medication Sig Start Date End Date Taking? Authorizing Provider  acetaminophen (TYLENOL) 325 MG tablet Take 650 mg by mouth every 6 (six) hours as needed for mild pain or headache.   Yes [provider]  B Complex-C (B-COMPLEX WITH VITAMIN C) tablet Take 1 tablet by mouth daily.   Yes [provider]  cholecalciferol (VITAMIN D) 1000 UNITS tablet Take 1,000 Units by mouth 2 (two) times daily.    Yes [provider]  estradiol (ESTRACE) 0.5 MG tablet Take 0.5 mg by mouth daily.    Yes [provider]  OVER THE COUNTER MEDICATION Take 1 capsule by mouth 2 (two) times daily. [place]7 daily.   Yes [provider]  OVER THE COUNTER MEDICATION 2 (two) times daily. Preservision vitamin for eyes   Yes [provider]  Polyethyl Glycol-Propyl Glycol (SYSTANE OP) Apply to eye  daily.   Yes [provider]  vitamin E 400 UNIT capsule Take 1 capsule by mouth daily.   Yes [provider]  azelastine (ASTELIN) 0.1 % nasal spray Place 1 spray into both nostrils 2 (two) times daily. Use in each nostril as directed Patient not taking: Reported on 06/18/2019 03/09/18   [place], Betty G, MD  fluticasone [facility]) 50 MCG/ACT nasal spray Place  1 spray into both nostrils 2 (two) times daily. Patient not taking: Reported on 06/18/2019 03/09/18   [place], Betty G, MD  mupirocin ointment (BACTROBAN) 2 % Apply 1 application topically 2 (two) times daily. Patient not taking: Reported on 06/18/2019 04/26/19   Caren Macadam, MD  nitrofurantoin, macrocrystal-monohydrate, (MACROBID) 100 MG capsule Take 1 capsule (100 mg total) by mouth 2 (two) times daily. X 7 days 06/18/19   Jacqlyn Larsen, [name]    Allergies    Alendronate sodium, Anaplex hd, Aspirin, Azithromycin, Calcium, Ciprofloxacin, Codeine, Doxycycline, Hydrocodone, Ibandronate sodium, Ibuprofen, Penicillins, Raloxifene, Risedronate sodium, Sulfonamide derivatives, and Teriparatide  Review of Systems   Review of Systems  Constitutional: Negative for chills and fever.  Eyes: Negative for visual disturbance.  Respiratory: Negative for cough and shortness of breath.   Cardiovascular: Negative for chest pain.  Gastrointestinal: Negative for abdominal pain, nausea and vomiting.  Genitourinary: Negative for dysuria and frequency.  Musculoskeletal: Negative for arthralgias and myalgias.  Skin: Negative for color change and rash.  Neurological: Positive for dizziness and headaches. Negative for syncope, facial asymmetry, speech difficulty, weakness, light-headedness and numbness.  All other systems reviewed and are negative.   Physical Exam Updated Vital Signs BP (!) 119/53   Pulse 60   Temp 98.4 F (36.9 C) (Oral)   Resp 15   SpO2 95%   Physical Exam Vitals and nursing note reviewed.  Constitutional:       General: She is not in acute distress.    Appearance: She is well-developed. She is not diaphoretic.     Comments: Elderly female sitting up in bed, conversant and in no acute distress  HENT:     Head: Normocephalic and atraumatic.     Right Ear: Tympanic membrane and ear canal normal.     Left Ear: Tympanic membrane and ear canal normal.     Mouth/Throat:     Mouth: Mucous membranes are moist.     Pharynx: Oropharynx is clear.  Eyes:     General:        Right eye: No discharge.        Left eye: No discharge.     Extraocular Movements: Extraocular movements intact.     Right eye: No nystagmus.     Left eye: No nystagmus.     Pupils: Pupils are equal, round, and reactive to light.  Cardiovascular:     Rate and Rhythm: Normal rate and regular rhythm.     Heart sounds: Normal heart sounds. No murmur. No friction rub. No gallop.   Pulmonary:     Effort: Pulmonary effort is normal. No respiratory distress.     Breath sounds: Normal breath sounds. No wheezing or rales.     Comments: Respirations equal and unlabored, patient able to speak in full sentences, lungs clear to auscultation bilaterally Abdominal:     General: Bowel sounds are normal. There is no distension.     Palpations: Abdomen is soft. There is no mass.     Tenderness: There is no abdominal tenderness. There is no guarding.     Comments: Abdomen soft, nondistended, nontender to palpation in all quadrants without guarding or peritoneal signs  Musculoskeletal:        General: No deformity.     Cervical back: Neck supple.  Skin:    General: Skin is warm and dry.     Capillary Refill: Capillary refill takes less than 2 seconds.  Neurological:     Mental Status: She is alert.  Coordination: Coordination normal.     Comments: Speech is clear, able to follow commands CN III-XII intact Normal strength in upper and lower extremities bilaterally including dorsiflexion and plantar flexion, strong and equal grip  strength Sensation normal to light and sharp touch Moves extremities without ataxia, coordination intact No pronator drift.  Psychiatric:        Mood and Affect: Mood normal.        Behavior: Behavior normal.     ED Results / Procedures / Treatments   Labs (all labs ordered are listed, but only abnormal results are displayed) Labs Reviewed  COMPREHENSIVE METABOLIC PANEL - Abnormal; Notable for the following components:      Result Value   GFR calc non Af Amer 55 (*)    All other components within normal limits  URINALYSIS, ROUTINE W REFLEX MICROSCOPIC - Abnormal; Notable for the following components:   Color, Urine STRAW (*)    APPearance HAZY (*)    Specific Gravity, Urine 1.004 (*)    Leukocytes,Ua MODERATE (*)    Bacteria, UA RARE (*)    Non Squamous Epithelial 0-5 (*)    All other components within normal limits  URINE CULTURE  PROTIME-INR  APTT  CBC  DIFFERENTIAL    EKG EKG Interpretation  Date/Time:  Sunday June 18 2019 08:54:46 EST Ventricular Rate:  78 PR Interval:  140 QRS Duration: 86 QT Interval:  368 QTC Calculation: 419 R Axis:   -85 Text Interpretation: Normal sinus rhythm Left axis deviation Septal infarct , age undetermined Abnormal ECG No STEMI Confirmed by Trifan, Matthew ([id number]) on 06/18/2019 9:50:07 AM   Radiology CT HEAD WO CONTRAST  Result Date: 06/18/2019 CLINICAL DATA:  86 year old female with LOWER leg numbness and dizziness. EXAM: CT HEAD WITHOUT CONTRAST TECHNIQUE: Contiguous axial images were obtained from the base of the skull through the vertex without intravenous contrast. COMPARISON:  None. FINDINGS: Brain: No evidence of acute infarction, hemorrhage, hydrocephalus, extra-axial collection or mass lesion/mass effect. Mild chronic small-vessel white matter ischemic changes are noted. Vascular: No hyperdense vessel or unexpected calcification. Skull: Normal. Negative for fracture or focal lesion. Sinuses/Orbits: No acute finding. Other:  None. IMPRESSION: 1. No evidence of acute intracranial abnormality. 2. Mild chronic small-vessel white matter ischemic changes. Electronically Signed   By: Jeffrey  Hu [name]   On: 06/18/2019 09:41    Procedures Procedures (including critical care time)  Medications Ordered in ED Medications  sodium chloride flush (NS) 0.9 % injection 3 mL (has no administration in time range)  acetaminophen (TYLENOL) tablet 650 mg (650 mg Oral Given 06/18/19 1114)    ED Course  I have reviewed the triage vital signs and the nursing notes.  Pertinent labs & imaging results that were available during my care of the patient were reviewed by me and considered in my medical decision making (see chart for details).    MDM Rules/Calculators/A&P                      86  year old female presents with dizziness and frontal headache.  Symptoms started about 24 hours after patient received the first dose of her Covid vaccine she developed a mild frontal headache that she states feels similar to a sinus headache.  She also states that intermittently when up walking around she has felt off balance.  Has a history of peripheral vertigo and states this feels similar, did have some improvement with BPPV exercises.  She also reports a flushing sensation and tightness in  her legs, she has had the symptoms intermittently since she had allergy testing performed in September, and they returned yesterday as well.  She has had extensive lab work regarding the symptoms without obvious etiology.  On exam she has no focal neurologic deficits.  She has not had any falls or syncopal episodes.  Will check basic labs, urinalysis, EKG and head CT.  Lab work very reassuring, no leukocytosis and normal hemoglobin, no acute electrolyte derangements, normal renal and liver function.  Awaiting urinalysis.  CT of the head with no acute intracranial abnormality.  Mild chronic small vessel disease noted.  Patient does not appear acutely vertiginous she  is conversant and able to move her head without difficulty.  Bilateral inner ears without evidence of infection.  Suspect this is likely peripheral vertigo, I have low suspicion for stroke given the patient does not have persistent vertiginous symptoms and has no other neurologic deficits.  Patient repeatedly discusses concerns over flushing symptoms, I have discussed with the patient that her work-up thus far is reassuring, given that the symptoms have been ongoing since September I have encouraged her to continue to seek evaluation with her primary care doctor, discussed that if these are related to an immune response since they started initially after allergy testing that a Covid vaccine could certainly cause these occur as well.  Urinalysis with signs of infection, culture sent, patient has reported some urinary frequency, this could certainly be contributing to her symptoms, patient has sensitivities to many medicines but based on previous notes from her primary care doctor it seems that she has tolerated Macrobid well for UTIs in the past, will prescribe this for the patient, I have stressed with her the importance of following up closely with her primary care doctor.  She has a history of peripheral vertigo, and has had some improvement in her symptoms with doing her vestibular exercises I have encouraged her to continue these.  Patient has had flushing symptoms since last September these are not new, may be worsened in the setting of recent Covid vaccination, I have discussed with her that her work-up was very reassuring and encouraged her to follow-up with her primary care doctor.  At this time there does not appear to be any evidence of an acute emergency medical condition and the patient appears stable for discharge with appropriate outpatient follow up.Diagnosis was discussed with patient who verbalizes understanding and is agreeable to discharge.  Care discussed with patient's daughter who  expresses understanding and agreement, will schedule follow-up appointment with primary care doctor.  Patient discussed with Dr. Langston Masker, who saw patient as well and agrees with plan.  Final Clinical Impression(s) / ED Diagnoses Final diagnoses:  Dizziness  Frontal headache  Acute cystitis without hematuria    Rx / DC Orders ED Discharge Orders         Ordered    nitrofurantoin, macrocrystal-monohydrate, (MACROBID) 100 MG capsule  2 times daily     06/18/19 1207           Jacqlyn Larsen, [place] 06/19/19 2015    Wyvonnia Dusky, MD 06/20/19 1309

## 2019-06-18 NOTE — ED Notes (Signed)
Pt ambulated in with NT in hall. Pt tol well.

## 2019-06-18 NOTE — ED Triage Notes (Signed)
Pt received 1st Covid vaccine on Thursday.  C/o dizziness, "sinus" headache, feeling off balance, chills, and numbness to L lower leg since yesterday afternoon.  States numbness has improved but still present.  No arm drift.

## 2019-06-18 NOTE — ED Notes (Signed)
Patient verbalizes understanding of discharge instructions . Opportunity for questions and answers were provided . Armband removed by staff ,Pt discharged from ED. W/C  offered at D/C  and Declined W/C at D/C and was escorted to lobby by RN.  

## 2019-06-19 ENCOUNTER — Telehealth: Payer: Self-pay | Admitting: Family Medicine

## 2019-06-19 LAB — URINE CULTURE

## 2019-06-19 NOTE — Telephone Encounter (Signed)
I touched base with her about this. You can disregard other message I sent you on her to check in s/p ER visit.   She is going to hold antibiotic until we get urine culture results. She did have some vaginal irritation, and frequency, but she has been drinking a lot more water. Feeling better today. Still with some dizziness; doing bpv exercises.

## 2019-06-19 NOTE — Telephone Encounter (Signed)
Pt is requesting a call back, she took her prescription for the ear infection yesterday and it gave her diarrhea.

## 2019-06-19 NOTE — Telephone Encounter (Signed)
Noted  

## 2019-06-21 ENCOUNTER — Telehealth: Payer: Self-pay | Admitting: *Deleted

## 2019-06-21 NOTE — Telephone Encounter (Signed)
Left a message for the pt to return my call.  

## 2019-06-21 NOTE — Telephone Encounter (Signed)
-----   Message from Caren Macadam, MD sent at 06/21/2019 12:37 PM EST ----- Please see how she is feeling; how she is doing with urination. Culture showed multiple species - sometimes we see this with skin bacteria, but if she is not feeling well, I would encourage her to restart the macrobid (WITH FOOD) and it would be good for [place] to get another culture on her. I know she has done this a couple of times recently, but I don't have a way of telling if there is some infection without a repeat. Please talk her through proper clean catch method - with wiping with steri-wipe first, then small urination, then collection so we get as accurate of sample as possible.

## 2019-06-22 ENCOUNTER — Telehealth: Payer: Self-pay | Admitting: *Deleted

## 2019-06-22 NOTE — Telephone Encounter (Signed)
Clinic RN spoke with patient. Informed her of the below message from Dr. Ethlyn Gallery. Patient verbalized understanding and reports she has stopped taking the Vitamin D all together and will finish the antibiotic and make a lab appointment for urine culture.

## 2019-06-22 NOTE — Telephone Encounter (Signed)
Increased urination is actually a listed reaction with the vitamin D; although I have not known others to note/mention this as a side effect. But if this was something new; it could have made a notable change from her baseline. Just let us know if any worsening of symptoms in the meanwhile; otherwise I will be back in touch once we see that next urine culture.

## 2019-06-22 NOTE — Telephone Encounter (Signed)
Noted  

## 2019-06-22 NOTE — Telephone Encounter (Signed)
Patient called back and was informed of the message below.  Patient stated she has been taking vitamin D 1000 units a day and questioned if this could be causing her urine problems.  I advised the pt of the calcium results and stated I am not aware of this causing urine problems.  I offered to schedule a lab appt for the repeat culture at this time as recommended below.  Patient stated she will call back for a lab appt after she finishes Macrobid.  Message forwarded to PCP.

## 2019-06-22 NOTE — Telephone Encounter (Signed)
-----   Message from Caren Macadam, MD sent at 06/21/2019 12:37 PM EST ----- Please see how she is feeling; how she is doing with urination. Culture showed multiple species - sometimes we see this with skin bacteria, but if she is not feeling well, I would encourage her to restart the macrobid (WITH FOOD) and it would be good for [place] to get another culture on her. I know she has done this a couple of times recently, but I don't have a way of telling if there is some infection without a repeat. Please talk her through proper clean catch method - with wiping with steri-wipe first, then small urination, then collection so we get as accurate of sample as possible.

## 2019-06-28 ENCOUNTER — Other Ambulatory Visit: Payer: Self-pay

## 2019-06-28 ENCOUNTER — Other Ambulatory Visit: Payer: Medicare PPO

## 2019-06-28 DIAGNOSIS — R232 Flushing: Secondary | ICD-10-CM

## 2019-06-28 DIAGNOSIS — M6281 Muscle weakness (generalized): Secondary | ICD-10-CM

## 2019-06-28 DIAGNOSIS — E538 Deficiency of other specified B group vitamins: Secondary | ICD-10-CM

## 2019-06-28 DIAGNOSIS — E039 Hypothyroidism, unspecified: Secondary | ICD-10-CM

## 2019-06-28 DIAGNOSIS — E559 Vitamin D deficiency, unspecified: Secondary | ICD-10-CM

## 2019-06-30 ENCOUNTER — Telehealth (INDEPENDENT_AMBULATORY_CARE_PROVIDER_SITE_OTHER): Payer: Medicare PPO | Admitting: Family Medicine

## 2019-06-30 ENCOUNTER — Other Ambulatory Visit: Payer: Self-pay

## 2019-06-30 DIAGNOSIS — M2550 Pain in unspecified joint: Secondary | ICD-10-CM

## 2019-06-30 DIAGNOSIS — R208 Other disturbances of skin sensation: Secondary | ICD-10-CM

## 2019-06-30 DIAGNOSIS — T7840XD Allergy, unspecified, subsequent encounter: Secondary | ICD-10-CM

## 2019-06-30 NOTE — Progress Notes (Signed)
Virtual Visit via Telephone Note  I connected with Heidi Moore  on 06/30/19 at  9:30 AM EST by telephone and verified that I am speaking with the correct person using two identifiers.   I discussed the limitations, risks, security and privacy concerns of performing an evaluation and management service by telephone and the availability of in person appointments. I also discussed with the patient that there may be a patient responsible charge related to this service. The patient expressed understanding and agreed to proceed.  Location patient: home Location provider: work office Participants present for the call: patient, provider Patient did not have a visit in the prior 7 days to address this/these issue(s).   History of Present Illness: Didn't know why we wanted [facility] to [facility] back to [facility]. Still has soreness in muscles, joints since getting COVID vaccination. Feels that current symptoms are related to immunization.  Has been dizzy as well. Did have scan of head when she was in the ER.  Burning sensation in body did go away with antihistamine, but then gets dizzy and blurred vision with this. Does have child's dose of allegra version.   Not taking anything over the counter recently.   Feels like she has funnier sensation from knee down. Wonders if it is from phenol used to deaden her toenail after toenail fungus. Was put on cipro after this and then stopped. Has improved since she was in the ER.   Does urinate a lot, but no burning in urination. Drinks 2 cups every hour.   Does go over medications with pharmacist regularly.   Taking B12 supplement. Taking the purvision 2 for macular degeneration BID. Taking B complex with C. Taking vitamin D 1000 units daily. Taking Joint health arthro 7.   Knees feel tight like they are swollen. Does wear support stockings.     Observations/Objective: Patient sounds cheerful and well on the phone. I do not appreciate any SOB. Speech and thought  processing are grossly intact. Patient reported vitals: none today  Assessment and Plan:  1. Allergic reaction, subsequent encounter This is been an ongoing discussion.  At this point, I am really not certain that her burning skin response is related to allergies.  She has had some medications, allergy testing, and vaccinations during this time which I do feel like she may have had an immune response to.  We discussed this, but also discussed that usually these responses are limited in duration as opposed to hers which has continued.  We discussed adding in a children's dose (or half a children's dose) of Zyrtec since she does seem to get relief with antihistamine, but has side effects of dry mouth, blurred vision, and dizziness (especially with Benadryl).  We discussed that only other medication we routinely get for allergic response would be prednisone, which have more negative side effects for her I believe.  2. Burning sensation of skin See above.  We did extensive blood work as well as urinary testing to rule out more detrimental causes of flushing/burning skin sensation.  She does seem to respond to antihistamines, so we will try these as above.  I also went through her medication list with her extensively today.  I am worried that some of her reactions may be related to medications/supplements.  Although she has been on supplements for years, we discussed that as you age the metabolism and length of duration of medications in your system is prolonged.  Additionally, she is recently been on antibiotics for potential  UTI, which could have some side effects.  I have asked her to decrease her medicine list to only her estrogen, eye vitamin, and children's Zyrtec.  She will call back prior to her scheduled Covid vaccine which is in the first week of March to let me know how she does with this.  3. Arthralgia, unspecified joint See above.  The symptoms started/worsened after Covid vaccination.  We  discussed that although joint pain and muscle aches have been a side effect of the vaccine, I have not seen this is a prolonged side effect.  I have asked her to make the above changes and then call back prior to her second vaccine so we can discuss how to proceed.  Certainly let us know any sooner if worsening of symptoms.   Follow Up Instructions:   [id number] [id number] 21-30 I did not refer this patient for an OV in the next 24 hours for this/these issue(s).  I discussed the assessment and treatment plan with the patient. The patient was provided an opportunity to ask questions and all were answered. The patient agreed with the plan and demonstrated an understanding of the instructions.   The patient was advised to call back or seek an in-person evaluation if the symptoms worsen or if the condition fails to improve as anticipated.  I provided 33 minutes of non-face-to-face time during this encounter.   Micheline Rough, MD

## 2019-07-06 ENCOUNTER — Other Ambulatory Visit: Payer: Self-pay

## 2019-07-07 ENCOUNTER — Ambulatory Visit (INDEPENDENT_AMBULATORY_CARE_PROVIDER_SITE_OTHER): Payer: Medicare PPO | Admitting: Family Medicine

## 2019-07-07 ENCOUNTER — Encounter: Payer: Self-pay | Admitting: Family Medicine

## 2019-07-07 VITALS — BP 100/54 | HR 81 | Temp 98.1°F | Ht 61.0 in | Wt 117.1 lb

## 2019-07-07 DIAGNOSIS — R232 Flushing: Secondary | ICD-10-CM

## 2019-07-07 DIAGNOSIS — R600 Localized edema: Secondary | ICD-10-CM | POA: Diagnosis not present

## 2019-07-07 NOTE — Patient Instructions (Signed)
*  wear compression stockings daily to help with lower leg swelling.  *when sitting, try to elevate your legs (above heart level is recommended if possible) *let me know if any worsening of swelling.  *keep sodium intake around 2500mg  daily to minimize sodium induced swelling.  I would limit your anti-histamine to once daily to minimize side effects and at low doses. I do think allegra is going to be the least likely to cause side effects based on the type of antihistamine it is. It would be ok to take daily at bedtime for a couple of weeks and see if regular dosing helps to decrease your "hot sensation" that you get.   COVID-19 Vaccine rescheduling:   call [phone].

## 2019-07-07 NOTE — Progress Notes (Signed)
Heidi Moore DOB: [birth year] Encounter date: 07/07/2019  This is a 86 y.o. female who presents with No chief complaint on file.   History of present illness: Having some swelling in ankles. Not itching, but still having some flushing. Mostly notes it from left knee down. Left knee is getting a little swollen. Wakes up in morning and gets burning and numb from knee down to big toe. Scared her enough that 2 sundays ago called daughter to take her to ER for this. Also still a little dizzy. Thinks related to anti-histamine.   She called Pfizer vaccination and feels she is still having symptoms from this - a little dizziness and the numbness in lower left leg she thought was related. She was on hold a long time and then had difficulty with understanding the person on the phone. She was told that she was supposed to get call back. Lady hung up on her after she waited on phone for 1.5 hours.   Thinks all going back to everything that she has put in her body since September - with toenail removal, phenol, flu shot, then allergy testing - which created more dizziness. Then second toenail at end of September.   Did try the children's allergy medication but it does make her dry mouthed. Tried not to take it yesterday because she wasn't feeling that "fever in her body". Does note some dizziness with it. It is allegra that she has been using.    Allergies  Allergen Reactions  . Alendronate Sodium Nausea And Vomiting  . Anaplex Hd Nausea And Vomiting  . Aspirin Nausea And Vomiting  . Azithromycin Nausea And Vomiting  . Calcium Nausea And Vomiting  . Ciprofloxacin     Patient states this caused nausea, dizziness and affected her muscles  . Codeine Nausea And Vomiting  . Doxycycline Nausea And Vomiting  . Hydrocodone Nausea And Vomiting  . Ibandronate Sodium     REACTION: severe body aches  . Ibuprofen Nausea And Vomiting  . Penicillins Nausea And Vomiting  . Raloxifene Nausea And Vomiting  .  Risedronate Sodium Nausea And Vomiting  . Sulfonamide Derivatives Nausea And Vomiting  . Teriparatide Nausea And Vomiting   Current Meds  Medication Sig  . acetaminophen (TYLENOL) 325 MG tablet Take 650 mg by mouth every 6 (six) hours as needed for mild pain or headache.  [name] azelastine (ASTELIN) 0.1 % nasal spray Place 1 spray into both nostrils 2 (two) times daily. Use in each nostril as directed  . B Complex-C (B-COMPLEX WITH VITAMIN C) tablet Take 1 tablet by mouth daily.  . cholecalciferol (VITAMIN D) 1000 UNITS tablet Take 1,000 Units by mouth 2 (two) times daily.   [name] estradiol (ESTRACE) 0.5 MG tablet Take 0.5 mg by mouth daily.   . fluticasone (FLONASE) 50 MCG/ACT nasal spray Place 1 spray into both nostrils 2 (two) times daily.  [name] OVER THE COUNTER MEDICATION Take 1 capsule by mouth 2 (two) times daily. [place]7 daily.  [name] OVER THE COUNTER MEDICATION 2 (two) times daily. Preservision vitamin for eyes  . Polyethyl Glycol-Propyl Glycol (SYSTANE OP) Apply to eye daily.  . vitamin E 400 UNIT capsule Take 1 capsule by mouth daily.    Review of Systems  Constitutional: Negative for chills, fatigue and fever.  Respiratory: Negative for cough, chest tightness, shortness of breath and wheezing.   Cardiovascular: Positive for leg swelling. Negative for chest pain and palpitations.  Gastrointestinal: Negative for diarrhea and nausea.  Musculoskeletal: Negative for arthralgias and back pain.  Neurological: Negative for weakness. Dizziness: improved.    Objective:  BP (!) 100/54 (BP Location: Left Arm, Patient Position: Sitting, Cuff Size: Normal)   Pulse 81   Temp 98.1 F (36.7 C) (Temporal)   Ht 5\' 1"  (1.549 m)   Wt 117 lb 1.6 oz (53.1 kg)   SpO2 98%   BMI 22.13 kg/m   Weight: 117 lb 1.6 oz (53.1 kg)   BP Readings from Last 3 Encounters:  07/07/19 (!) 100/54  06/18/19 (!) 119/53  06/14/19 140/80   Wt Readings from Last 3 Encounters:  07/07/19 117 lb 1.6 oz (53.1  kg)  06/14/19 117 lb 11.2 oz (53.4 kg)  06/06/19 115 lb 1.6 oz (52.2 kg)    Physical Exam Constitutional:      General: She is not in acute distress.    Appearance: She is well-developed.  Cardiovascular:     Rate and Rhythm: Normal rate and regular rhythm.     Heart sounds: Normal heart sounds. No murmur. No friction rub.  Pulmonary:     Effort: Pulmonary effort is normal. No respiratory distress.     Breath sounds: Normal breath sounds. No wheezing or rales.  Musculoskeletal:     Right lower leg: No edema (trace).     Left lower leg: No edema (trace).  Neurological:     Mental Status: She is alert and oriented to person, place, and time.  Psychiatric:        Behavior: Behavior normal.     Assessment/Plan  1. Bilateral lower extremity edema Mild on exam and does resolve by morning.  Patient does have compression stockings at home and will try wearing these.  We discussed medications to help with edema, but I am worried with her difficulty tolerating medications this only make her feel worse.  We discussed monitoring sodium intake and elevating legs whenever sitting.  She is going to try to do this.  She is also going to try to simplify her medication routine.  We discussed using children's Allegra just at bedtime to see if this helps with her hot sensation she is getting.  We did an extensive lab work-up on her previously which all looked quite good.  If she still having symptoms with the above in a couple of weeks, we will have to reconsider further evaluation.  2. Flushing See above.  Children's Allegra just at bedtime.  Limit all other nonprescription medications as these could contribute to side effects she is experiencing.  She is also going to put on hold her booster Covid vaccination since she felt very unwell after receiving this.   Return for UPDATE ME BY PHONE IN 2 WEEKS.  Over 30 minutes spent in the room with patient going through potential side effects of medications  and going through best tolerated antihistamines to take for flushing sensation.  She does have relief of her symptoms when she takes antihistamines.  At this point she prefers not to see any additional specialist if not needed.    Micheline Rough, MD

## 2019-07-10 ENCOUNTER — Ambulatory Visit: Payer: Medicare PPO

## 2019-07-17 ENCOUNTER — Other Ambulatory Visit: Payer: Self-pay | Admitting: Family Medicine

## 2019-07-17 ENCOUNTER — Telehealth: Payer: Self-pay | Admitting: Family Medicine

## 2019-07-17 DIAGNOSIS — R208 Other disturbances of skin sensation: Secondary | ICD-10-CM

## 2019-07-17 DIAGNOSIS — R232 Flushing: Secondary | ICD-10-CM

## 2019-07-17 NOTE — Telephone Encounter (Signed)
March 29th - 2nd COVID vaccination  Needs a referral to an immunization doctor. March 15th the patient is seeing an immunization doctor - Dr. Starling Manns on [facility] [id number] March 15th is the next available   Not itching all the time, still having hot flashes,horse  Burning up this morning in her arms, knees and all over her body.  Feeling off balance and blurred vision. Blurred vision went away yesterday. It's been going on 6 months now.  She wants Dr. Ethlyn Moore to call and talk to her about this referral and possible to start taking steroids.  Patient wears compression socks for the past 2 days and legs are still swollen mostly around her right ankle.   Please advise

## 2019-07-17 NOTE — Telephone Encounter (Signed)
*  referral placed for Dr. Starling Manns *recommend again keeping med regimen simple to minimize side effects. Might consider being off of anti-histamines for allergy evaluation to be more informative (she can ask their office what thye recommend).  *I would not recommend anything for leg swelling right now if she is tolerating since mild and I worry more about side effects from medication than benefit. If swelling has worsened; then let us know.

## 2019-07-18 NOTE — Telephone Encounter (Signed)
Spoke with the pt and informed her of the message below.   

## 2019-07-25 ENCOUNTER — Telehealth: Payer: Self-pay | Admitting: Family Medicine
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
## 2019-07-25 NOTE — Telephone Encounter (Signed)
She has previously taken prednisone 10mg . Although I am hesitant to add new medications on board, I think it is reasonable for her to try this and see if it relieves symptoms. She needs to take with food, in the morning. I think that 10mg  daily x 7 days of prednisone would be ok. If she doesn't get any improvement in symptoms by Friday with this let me know and we could discuss slightly higher dose. Please send in for her to preferred pharmacy.

## 2019-07-26 MED ORDER — PREDNISONE 10 MG PO TABS: 10.0000 mg | ORAL_TABLET | Freq: Every day | ORAL | 0 refills | Status: DC

## 2019-07-26 NOTE — Telephone Encounter (Signed)
Spoke with the pt and informed her of the message below.  Patient is aware the Rx was sent to [facility] and will call back on Friday if needed.

## 2019-07-28 ENCOUNTER — Telehealth: Payer: Self-pay | Admitting: Family Medicine

## 2019-07-28 ENCOUNTER — Encounter: Payer: Self-pay | Admitting: Family Medicine

## 2019-07-28 ENCOUNTER — Ambulatory Visit (INDEPENDENT_AMBULATORY_CARE_PROVIDER_SITE_OTHER): Payer: Medicare PPO | Admitting: Family Medicine

## 2019-07-28 ENCOUNTER — Other Ambulatory Visit: Payer: Self-pay

## 2019-07-28 VITALS — BP 138/70 | HR 85 | Temp 97.9°F | Ht 61.0 in | Wt 115.7 lb

## 2019-07-28 DIAGNOSIS — R110 Nausea: Secondary | ICD-10-CM

## 2019-07-28 DIAGNOSIS — E559 Vitamin D deficiency, unspecified: Secondary | ICD-10-CM

## 2019-07-28 DIAGNOSIS — E538 Deficiency of other specified B group vitamins: Secondary | ICD-10-CM

## 2019-07-28 DIAGNOSIS — R208 Other disturbances of skin sensation: Secondary | ICD-10-CM | POA: Diagnosis not present

## 2019-07-28 DIAGNOSIS — E039 Hypothyroidism, unspecified: Secondary | ICD-10-CM

## 2019-07-28 DIAGNOSIS — R7989 Other specified abnormal findings of blood chemistry: Secondary | ICD-10-CM | POA: Diagnosis not present

## 2019-07-28 LAB — CORTISOL: Cortisol, Plasma: 11.8 ug/dL

## 2019-07-28 LAB — VITAMIN B12: Vitamin B-12: 511 pg/mL (ref 211–911)

## 2019-07-28 LAB — VITAMIN D 25 HYDROXY (VIT D DEFICIENCY, FRACTURES): VITD: 37.78 ng/mL (ref 30.00–100.00)

## 2019-07-28 LAB — TSH: TSH: 4.73 u[IU]/mL — ABNORMAL HIGH (ref 0.35–4.50)

## 2019-07-28 NOTE — Patient Instructions (Signed)
See if you can check blood pressure when you are having the early morning episodes. Your blood pressure cuff should tell you heart rate as well, so plesae record these. Do not check more than twice daily.   I will be in touch once we get bloodwork back.   Stop the prednisone.   Continue with compression stockings. Keep feet elevated over heart level.

## 2019-07-28 NOTE — Telephone Encounter (Signed)
Spoke with patient. Patient verbalizes she took Prednisone Tuesday, Wednesday, Thursday. Then started having the swelling in her lower body, bloating, and pain. Patient denies shortness of breath, tongue swelling, lip swelling. Advised patient to stop taking Prednisone. Patient is having dizziness and blurred visio as well. Patient is scheduled to see PCP today at 12PM.

## 2019-07-28 NOTE — Telephone Encounter (Signed)
pt is having an allergic reaction to  predniSONE (DELTASONE) 10 MG tablet has leg swelling, knees swelling, burning in arms waist down. And dizzy with blurred vision. Pt has stopped taking the medication pt would like a call to  [phone]

## 2019-07-28 NOTE — Telephone Encounter (Signed)
If all those symptoms are currently going on she needs to be seen. Please make sure no swelling in mouth/tongue - if so needs to go to ER. If sx are mild, she can try antihistamine at home and stop taking the prednisone. She has appointment with allergist I believe next week. Please gauge severity of symptoms.

## 2019-07-28 NOTE — Progress Notes (Signed)
Heidi Moore DOB: 08-12-1919 Encounter date: 07/28/2019  This is a 86 y.o. female who presents with Chief Complaint  Patient presents with  . Medication Reaction    History of present illness: Patient is here today with her daughter really looking for some answers for her ongoing symptoms.  Heidi Moore feels that all symptoms are related to interventions and medications that she has been getting since last September.  She has concerns that phenol used for toenail removals and podiatry, allergy testing done in the fall, recent Covid vaccination are all contributing to her ongoing symptoms.  Most recently, she requested to do a prednisone burst to see if this would help with her symptoms, but instead it made her feel worse.  Was working on bike this morning.  She actually feels better when she is exercising.  Her flushing/hot sensation does not occur when she is exercising.  Still waking up at 2-3am. This morning woke and was burning in legs. Yesterday all swollen in knees.  She states that she is waking up every morning from sleep with the similar feelings.  At night has to take off compression stockings.  She does feel like compression stockings help during the day.  Her daughter and her are very concerned about her leg swelling.  Feels like emotions are just all upset. Does have some blurred vision now too. Took prednisone Tuesday first time. Trying to drink more because urinating so much.   Felt like burning all over restarted. Not itching. Just arms/legs that are burning; seems worse at night.   Feels like balance is off.   Son is worried and she states that he wanted her to see rheumatologist.  He called and tried to get her an appointment but was upset that they could not get an appointment until May.  Felt like the dizziness and swelling seemed to start after the second day of prednisone. This morning felt more blurred vision/dizzy. Seems to hit her more early in the morning. Has  hard time getting to sleep and staying to sleep.   Gets waves of dizziness; joints aching/swelling. Felt like joints hurt worse after vaccine.   No vomiting, no diarrhea. Does have some nausea. Has had stomach issue for years though (daughter states). Patient states that emotions work through upper stomach.   Has to make self eat now.     Allergies  Allergen Reactions  . Alendronate Sodium Nausea And Vomiting  . Anaplex Hd Nausea And Vomiting  . Aspirin Nausea And Vomiting  . Azithromycin Nausea And Vomiting  . Calcium Nausea And Vomiting  . Ciprofloxacin     Patient states this caused nausea, dizziness and affected her muscles  . Codeine Nausea And Vomiting  . Doxycycline Nausea And Vomiting  . Hydrocodone Nausea And Vomiting  . Ibandronate Sodium     REACTION: severe body aches  . Ibuprofen Nausea And Vomiting  . Penicillins Nausea And Vomiting  . Raloxifene Nausea And Vomiting  . Risedronate Sodium Nausea And Vomiting  . Sulfonamide Derivatives Nausea And Vomiting  . Teriparatide Nausea And Vomiting   Current Meds  Medication Sig  . acetaminophen (TYLENOL) 325 MG tablet Take 650 mg by mouth every 6 (six) hours as needed for mild pain or headache.  . B Complex-C (B-COMPLEX WITH VITAMIN C) tablet Take 1 tablet by mouth daily.  . cholecalciferol (VITAMIN D) 1000 UNITS tablet Take 1,000 Units by mouth 2 (two) times daily.   [name] estradiol (ESTRACE) 0.5 MG tablet Take 0.5  mg by mouth daily.   [name] OVER THE COUNTER MEDICATION Take 1 capsule by mouth 2 (two) times daily. [place]7 daily.  [name] OVER THE COUNTER MEDICATION 2 (two) times daily. Preservision vitamin for eyes  . Polyethyl Glycol-Propyl Glycol (SYSTANE OP) Apply to eye daily.  . vitamin E 400 UNIT capsule Take 1 capsule by mouth daily.  . [DISCONTINUED] predniSONE (DELTASONE) 10 MG tablet Take 1 tablet (10 mg total) by mouth daily with breakfast.    Review of Systems  Constitutional: Positive for fatigue.  Negative for activity change, appetite change, chills, fever and unexpected weight change.  HENT: Negative for congestion, ear pain, hearing loss, sinus pressure, sinus pain, sore throat and trouble swallowing.   Eyes: Negative for pain and visual disturbance.  Respiratory: Negative for cough, chest tightness, shortness of breath and wheezing.   Cardiovascular: Positive for leg swelling. Negative for chest pain and palpitations.  Gastrointestinal: Positive for nausea. Negative for abdominal pain, blood in stool, constipation, diarrhea and vomiting.  Endocrine: Positive for heat intolerance.  Genitourinary: Negative for difficulty urinating, dysuria, frequency and menstrual problem.  Musculoskeletal: Negative for arthralgias and back pain.  Skin: Negative for rash.  Neurological: Positive for dizziness and weakness. Negative for numbness and headaches.  Hematological: Negative for adenopathy. Does not bruise/bleed easily.  Psychiatric/Behavioral: Negative for sleep disturbance and suicidal ideas. The patient is not nervous/anxious.     Objective:  BP 138/70 (BP Location: Left Arm, Patient Position: Sitting, Cuff Size: Normal)   Pulse 85   Temp 97.9 F (36.6 C) (Temporal)   Ht 5\' 1"  (1.549 m)   Wt 115 lb 11.2 oz (52.5 kg)   SpO2 97%   BMI 21.86 kg/m   Weight: 115 lb 11.2 oz (52.5 kg)   BP Readings from Last 3 Encounters:  07/28/19 138/70  07/07/19 (!) 100/54  06/18/19 (!) 119/53   Wt Readings from Last 3 Encounters:  07/28/19 115 lb 11.2 oz (52.5 kg)  07/07/19 117 lb 1.6 oz (53.1 kg)  06/14/19 117 lb 11.2 oz (53.4 kg)    Physical Exam Constitutional:      General: She is not in acute distress.    Appearance: She is well-developed. She is not diaphoretic.  HENT:     Head: Normocephalic and atraumatic.     Right Ear: External ear normal.     Left Ear: External ear normal.  Eyes:     Conjunctiva/sclera: Conjunctivae normal.     Pupils: Pupils are equal, round, and reactive  to light.  Neck:     Thyroid: No thyromegaly.  Cardiovascular:     Rate and Rhythm: Normal rate and regular rhythm.     Heart sounds: Normal heart sounds. No murmur. No friction rub. No gallop.   Pulmonary:     Effort: Pulmonary effort is normal. No respiratory distress.     Breath sounds: Normal breath sounds. No wheezing or rales.  Musculoskeletal:     Cervical back: Neck supple.  Lymphadenopathy:     Cervical: No cervical adenopathy.  Skin:    General: Skin is warm and dry.  Neurological:     Mental Status: She is alert and oriented to person, place, and time.     Cranial Nerves: No cranial nerve deficit.     Motor: No abnormal muscle tone.     Deep Tendon Reflexes: Reflexes normal.     Reflex Scores:      Tricep reflexes are 2+ on the right side and 2+ on  the left side.      Bicep reflexes are 2+ on the right side and 2+ on the left side.      Brachioradialis reflexes are 2+ on the right side and 2+ on the left side.      Patellar reflexes are 2+ on the right side and 2+ on the left side. Psychiatric:        Behavior: Behavior normal.     Assessment/Plan  1. Burning sensation of skin She has not had any visible rash during the time of her complaints.  She does continue to have daily "burning sensation" in her upper extremities.  This actually seems to improve with activity level.  We have previously done a significant work-up for conditions that may cause flushing, but I did not include any work-up for pheochromocytoma.  Although I feel like this would be very rare, since she is here in the office today I am going to include testing for this.  I feel if this is negative that she may be best managed by endocrinology?  I really do not feel that this is an autoimmune issue based on previous blood work we have done.  I tried to relay this to her and her daughter.  They are very focused on seeing rheumatology because the patient's son has looked into this, but with her CRP, sed rate,  and ANA all being normal, I really do not feel that rheumatology would be my first line for next evaluation. - Metanephrines, plasma; Future - Cortisol - Metanephrines, plasma  2. Nausea I have asked her to stop the prednisone.  She has difficulty tolerating pretty much all medications.  I have also asked her to stop her supplements, as she tends to keep restarting these and I worry about interaction with supplements and other medications.  I did advise her it would be okay to continue her PreserVision, as this is given by her eye doctor, but I would like for her to stop all other over-the-counter supplements. - Metanephrines, plasma; Future - Metanephrines, plasma  3. Hypothyroidism, unspecified type - TSH  4. Vitamin D deficiency - VITAMIN D 25 Hydroxy (Vit-D Deficiency, Fractures)  5. B12 deficiency - Vitamin B12  6. Abnormal thyroid blood test - TSH    Return for pending bloodwork. In the office today we spent over 45 minutes reviewing previous lab evaluation, lab results, physical exam, ongoing symptoms, medical visits that she has had in the past.  I also discussed her case with my colleague who has seen her on 2 occasions for above symptoms.  We reviewed her lower extremity edema (which is very subtle on exam) and I tried to reassure patient and daughter that I do not feel we need to focus further evaluation on the edema at this point.  Her exam is very benign, so if these labs are not revealing, we will need to consider specialty evaluation for ongoing symptoms.   Micheline Rough, MD

## 2019-07-31 ENCOUNTER — Ambulatory Visit: Payer: Medicare Other | Admitting: Allergy and Immunology

## 2019-07-31 LAB — METANEPHRINES, PLASMA
Metanephrine, Free: <25 pg/mL (ref <=57)
Normetanephrine, Free: 79 pg/mL (ref <=148)
Total Metanephrines-Plasma: 79 pg/mL (ref <=205)

## 2019-08-02 ENCOUNTER — Telehealth: Payer: Self-pay | Admitting: Family Medicine

## 2019-08-02 ENCOUNTER — Ambulatory Visit: Payer: Medicare PPO | Admitting: Family Medicine

## 2019-08-02 NOTE — Addendum Note (Signed)
Addended by: Agnes Lawrence on: 08/02/2019 02:23 PM   Modules accepted: Orders

## 2019-08-02 NOTE — Telephone Encounter (Signed)
Pt states before she sees an endocrinology she wants to see a vascular doctor because she is still having numbness and swelling from the knee down on both legs for five months now. She also says she feels flush especially on her arms and legs--worse at night. 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   Pt can be reached at [phone]

## 2019-08-02 NOTE — Telephone Encounter (Signed)
If she wants to follow up with vascular, I am ok with that, but I so not feel she has any varicosities that are currently problematic. I do know she has had some swelling, but it was mild on my exam and she had good blood flow/circulation to feet.   I would like to ask if she has had any recurrent palpitations? This was something discussed at a visit with Dr. Elease Hashimoto? I believe she only had these for a short time, but if feeling these regularly, we should further evaluate and if she is having abnormal rhythms then this could contribute to symptoms as well, so please let me know.

## 2019-08-04 DIAGNOSIS — L820 Inflamed seborrheic keratosis: Secondary | ICD-10-CM | POA: Diagnosis not present

## 2019-08-04 DIAGNOSIS — Z85828 Personal history of other malignant neoplasm of skin: Secondary | ICD-10-CM | POA: Diagnosis not present

## 2019-08-04 DIAGNOSIS — L814 Other melanin hyperpigmentation: Secondary | ICD-10-CM | POA: Diagnosis not present

## 2019-08-04 NOTE — Telephone Encounter (Signed)
Left a message for the pt to return my call.  

## 2019-08-04 NOTE — Telephone Encounter (Signed)
Noted  

## 2019-08-04 NOTE — Telephone Encounter (Signed)
Pt returning Heidi Moore call. Pt stated she is going to the dermatologist at 4pm so to call after 4:30pm. She also wanted to let her PCP know that she is going to the gland and thyroid doctor she referred her too. She is not going to the vascular vein doctor.

## 2019-08-07 ENCOUNTER — Telehealth: Payer: Self-pay | Admitting: Family Medicine

## 2019-08-07 ENCOUNTER — Ambulatory Visit: Payer: Medicare PPO | Attending: Internal Medicine

## 2019-08-07 DIAGNOSIS — M79604 Pain in right leg: Secondary | ICD-10-CM | POA: Diagnosis not present

## 2019-08-07 DIAGNOSIS — M79652 Pain in left thigh: Secondary | ICD-10-CM | POA: Diagnosis not present

## 2019-08-07 DIAGNOSIS — R600 Localized edema: Secondary | ICD-10-CM

## 2019-08-07 DIAGNOSIS — Z23 Encounter for immunization: Secondary | ICD-10-CM

## 2019-08-07 DIAGNOSIS — M79605 Pain in left leg: Secondary | ICD-10-CM | POA: Diagnosis not present

## 2019-08-07 DIAGNOSIS — M79651 Pain in right thigh: Secondary | ICD-10-CM | POA: Diagnosis not present

## 2019-08-07 NOTE — Telephone Encounter (Signed)
Pt is requesting a referral to [place] [facility].

## 2019-08-07 NOTE — Progress Notes (Signed)
   Covid-19 Vaccination Clinic  Name:  Heidi Moore    MRN: [id number] DOB: [birth year]  08/07/2019  [name] was observed post Covid-19 immunization for 15 minutes without incident. She was provided with Vaccine Information Sheet and instruction to access the V-Safe system.   [name] was instructed to call 911 with any severe reactions post vaccine: [name] Difficulty breathing  . Swelling of face and throat  . A fast heartbeat  . A bad rash all over body  . Dizziness and weakness   Immunizations Administered    Name Date Dose VIS Date Route   Pfizer COVID-19 Vaccine 08/07/2019 11:25 AM 0.3 mL 04/21/2019 Intramuscular   Manufacturer: [place]   Lot: [id number]   [place]: [id number]

## 2019-08-14 NOTE — Telephone Encounter (Signed)
[place] for referral. Please let her know once entered.

## 2019-08-14 NOTE — Telephone Encounter (Signed)
Spoke with the pt and informed her the referral was placed and someone will call with appt info.  Patient stated she had an appt on 3/29.

## 2019-08-23 DIAGNOSIS — R202 Paresthesia of skin: Secondary | ICD-10-CM | POA: Diagnosis not present

## 2019-08-23 DIAGNOSIS — I8311 Varicose veins of right lower extremity with inflammation: Secondary | ICD-10-CM | POA: Diagnosis not present

## 2019-08-23 DIAGNOSIS — M79604 Pain in right leg: Secondary | ICD-10-CM | POA: Diagnosis not present

## 2019-08-23 DIAGNOSIS — I8312 Varicose veins of left lower extremity with inflammation: Secondary | ICD-10-CM | POA: Diagnosis not present

## 2019-08-23 DIAGNOSIS — M79605 Pain in left leg: Secondary | ICD-10-CM | POA: Diagnosis not present

## 2019-08-25 NOTE — Progress Notes (Signed)
NEUROLOGY FOLLOW UP OFFICE NOTE  RAMYA LINGG [id number]  HISTORY OF PRESENT ILLNESS: Heidi Moore is a 86 year old female who presents for vertigo.  She is accompanied by her daughter who supplements history.  Ophthalmology note reviewed.  She is accompanied by her daughter who supplements history.  Last seen in 2019 for dizziness.  Symptoms resolved with vestibular rehab.  In September, she received allergy shots and almost immediately developed blurred vision, sensation of flushing and itching involving her entire body, including numbness in her legs.  She also endorsed dizziness and feeling unsteady on her feet.  The blurred vision is bilateral but is improved but not completely resolved when covering either eye.  Not double vision.  She had an eye exam performed by Dr. Kathlen Mody in January which was unrevealing.  She had CT of head on 06/18/2019, which was personally reviewed and negative for acute intracranial abnormality.  Numbness in legs have improved, as well as dizziness.  Still has some mild blurred vision.  Still feels off-balance.  PAST MEDICAL HISTORY: Past Medical History:  Diagnosis Date  . AC (acromioclavicular) joint bone spurs    on right shoulder   . Allergic rhinitis   . Arthritis   . Bursitis    right shoulder  . Complication of anesthesia    headaches  . Diverticulosis   . DIVERTICULOSIS, COLON 10/15/2006   Qualifier: Diagnosis of  By: Linna Darner MD, Gwyndolyn Saxon Eye problem    treated by Dr Kathlen Mody per patient  . Headache(784.0)    sinus HA  . History of blood transfusion 65yrs ago   no abnormal reaction noted  . History of cystitis   . History of skin cancer    Basal cell, Dr. Lindwood Coke  . Hyperlipidemia    [place] Study LDL goal =<160   . Hypotension   . Joint pain   . Joint swelling   . Macular degeneration, dry   . Osteopenia    Intolerance to Calcium,Actonel,Fosamax,Evista, Forteo: S/P Boniva 2007 to 03/2010  . OSTEOPENIA 03/12/2008   Qualifier:  Diagnosis of  By: Velora Heckler    . Osteoporosis   . PONV (postoperative nausea and vomiting)   . Rosacea 04/22/2009   Qualifier: Diagnosis of  By: Linna Darner MD, Gwyndolyn Saxon    . Skin cancer 06/22/2017   per patient after excision right calf by Dr Ubaldo Glassing  . SKIN CANCER, HX OF 03/12/2008   Qualifier: Diagnosis of  By: Velora Heckler    . Urinary frequency   . Vaginal atrophy    sees Dr. Gertie Fey    MEDICATIONS: Current Outpatient Medications on File Prior to Visit  Medication Sig Dispense Refill  . acetaminophen (TYLENOL) 325 MG tablet Take 650 mg by mouth every 6 (six) hours as needed for mild pain or headache.    . B Complex-C (B-COMPLEX WITH VITAMIN C) tablet Take 1 tablet by mouth daily.    . cholecalciferol (VITAMIN D) 1000 UNITS tablet Take 1,000 Units by mouth 2 (two) times daily.     [name] estradiol (ESTRACE) 0.5 MG tablet Take 0.5 mg by mouth daily.     [name] OVER THE COUNTER MEDICATION Take 1 capsule by mouth 2 (two) times daily. Bibo7 daily.    [name] OVER THE COUNTER MEDICATION 2 (two) times daily. Preservision vitamin for eyes    . Polyethyl Glycol-Propyl Glycol (SYSTANE OP) Apply to eye daily.    . vitamin E 400 UNIT capsule Take 1  capsule by mouth daily.     No current facility-administered medications on file prior to visit.    ALLERGIES: Allergies  Allergen Reactions  . Alendronate Sodium Nausea And Vomiting  . Anaplex Hd Nausea And Vomiting  . Aspirin Nausea And Vomiting  . Azithromycin Nausea And Vomiting  . Calcium Nausea And Vomiting  . Ciprofloxacin     Patient states this caused nausea, dizziness and affected her muscles  . Codeine Nausea And Vomiting  . Doxycycline Nausea And Vomiting  . Hydrocodone Nausea And Vomiting  . Ibandronate Sodium     REACTION: severe body aches  . Ibuprofen Nausea And Vomiting  . Penicillins Nausea And Vomiting  . Raloxifene Nausea And Vomiting  . Risedronate Sodium Nausea And Vomiting  . Sulfonamide Derivatives Nausea And  Vomiting  . Teriparatide Nausea And Vomiting    FAMILY HISTORY: Family History  Problem Relation Age of Onset  . Colon cancer Other        1st Cousin   . Stroke Mother 83  . Diabetes Brother   . Atrial fibrillation Brother        coumadin  . Lung cancer Sister    SOCIAL HISTORY: Social History   Socioeconomic History  . Marital status: Widowed    Spouse name: Not on file  . Number of children: 3  . Years of education: Not on file  . Highest education level: Some college, no degree  Occupational History    Employer: RETIRED  Tobacco Use  . Smoking status: Never Smoker  . Smokeless tobacco: Never Used  Substance and Sexual Activity  . Alcohol use: No  . Drug use: No  . Sexual activity: Not on file  Other Topics Concern  . 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 She lives alone. She goes to the gym and participates in aerobics 3 x week. She drinks 3 cups of coffee a day.         Social Determinants of Health   Financial Resource Strain:   . Difficulty of Paying Living Expenses:   Food Insecurity:   . Worried About Charity fundraiser in the Last Year:   . Arboriculturist in the Last Year:   Transportation Needs:   . Film/video editor (Medical):   [name] Lack of Transportation (Non-Medical):   Physical Activity:   . Days of Exercise per Week:   . Minutes of Exercise per Session:   Stress:   . Feeling of Stress :   Social Connections:   . Frequency of Communication with Friends and Family:   . Frequency  of Social Gatherings with Friends and Family:   . Attends Religious Services:   . Active Member of Clubs or Organizations:   . Attends Archivist Meetings:   [name] Marital Status:   Intimate Partner Violence:   . Fear of Current or Ex-Partner:   . Emotionally Abused:   .  Physically Abused:   . Sexually Abused:     REVIEW OF SYSTEMS: Constitutional: No fevers, chills, or sweats, no generalized fatigue, change in appetite Eyes: No visual changes, double vision, eye pain Ear, nose and throat: No hearing loss, ear pain, nasal congestion, sore throat Cardiovascular: No chest pain, palpitations Respiratory:  No shortness of breath at rest or with exertion, wheezes GastrointestinaI: No nausea, vomiting, diarrhea, abdominal pain, fecal incontinence Genitourinary:  No dysuria, urinary retention or frequency Musculoskeletal:  No neck pain, back pain Integumentary: No rash, pruritus, skin lesions Neurological: as above Psychiatric: No depression, insomnia, anxiety Endocrine: No palpitations, fatigue, diaphoresis, mood swings, change in appetite, change in weight, increased thirst Hematologic/Lymphatic:  No purpura, petechiae. Allergic/Immunologic: no itchy/runny eyes, nasal congestion, recent allergic reactions, rashes  PHYSICAL EXAM: Blood pressure 119/72, pulse 84, height 5\' 1"  (1.549 m), weight 116 lb 3.2 oz (52.7 kg), SpO2 96 %. General: No acute distress.  Patient appears well-groomed.   Head:  Normocephalic/atraumatic Eyes:  Fundi examined but not visualized Neck: supple, no paraspinal tenderness, full range of motion Heart:  Regular rate and rhythm Lungs:  Clear to auscultation bilaterally Back: No paraspinal tenderness Neurological Exam: alert and oriented to person, place, and time. Attention span and concentration intact, recent and remote memory intact, fund of knowledge intact.  Speech fluent and not dysarthric, language intact.  CN II-XII intact. Bulk and tone normal,  muscle strength 5/5 throughout.  Sensation to pinprick intact.  Sensation to vibration reduced in feet.  Deep tendon reflexes 2+ throughout, toes downgoing.  Finger to nose testing intact.  Gait with slight limp.  Romberg with sway.  IMPRESSION: 1.  Blurred vision, dizziness, unsteady gait, paresthesias.  Symptoms started suddenly after receiving allergy shots in September, raising suspicion of a reaction, but I wouldn't expect symptoms to persist so long.  Dizziness, blurred vision and paresthesias have largely resolved.  She does have mild signs of neuropathy in the feet but not significant.  PLAN: 1.  We will check MRI of brain without contrast.   2.  Further recommendations pending results.  Metta Clines, DO  CC: Micheline Rough, MD

## 2019-08-28 ENCOUNTER — Encounter: Payer: Self-pay | Admitting: Neurology

## 2019-08-28 ENCOUNTER — Ambulatory Visit: Payer: Medicare PPO | Admitting: Neurology

## 2019-08-28 ENCOUNTER — Other Ambulatory Visit: Payer: Self-pay

## 2019-08-28 VITALS — BP 119/72 | HR 84 | Ht 61.0 in | Wt 116.2 lb

## 2019-08-28 DIAGNOSIS — R42 Dizziness and giddiness: Secondary | ICD-10-CM

## 2019-08-28 DIAGNOSIS — H538 Other visual disturbances: Secondary | ICD-10-CM

## 2019-08-28 DIAGNOSIS — R2681 Unsteadiness on feet: Secondary | ICD-10-CM | POA: Diagnosis not present

## 2019-08-28 NOTE — Patient Instructions (Addendum)
We will check MRI of brain without contrast, We have sent a referral to [facility] for your MRI and they will call you directly to schedule your appointment. They are located at [place]. If you need to contact them directly please call [phone].   Further recommendations pending results.

## 2019-09-01 DIAGNOSIS — M9901 Segmental and somatic dysfunction of cervical region: Secondary | ICD-10-CM | POA: Diagnosis not present

## 2019-09-01 DIAGNOSIS — M545 Low back pain: Secondary | ICD-10-CM | POA: Diagnosis not present

## 2019-09-01 DIAGNOSIS — M9903 Segmental and somatic dysfunction of lumbar region: Secondary | ICD-10-CM | POA: Diagnosis not present

## 2019-09-01 DIAGNOSIS — M542 Cervicalgia: Secondary | ICD-10-CM | POA: Diagnosis not present

## 2019-09-04 ENCOUNTER — Telehealth: Payer: Self-pay | Admitting: Neurology

## 2019-09-04 NOTE — Telephone Encounter (Signed)
Tammy with [employer] called and left a message stating the patient needs a procedure done and that procedure needs prior approval. Prior Approval can be submitted on [email or web address]. Their fax is [phone] and phone number is [phone].

## 2019-09-04 NOTE — Telephone Encounter (Signed)
Per staff message from [id number] valid until 05/26

## 2019-09-12 ENCOUNTER — Ambulatory Visit (INDEPENDENT_AMBULATORY_CARE_PROVIDER_SITE_OTHER): Payer: Medicare PPO

## 2019-09-12 ENCOUNTER — Ambulatory Visit: Payer: Medicare PPO | Admitting: Orthopaedic Surgery

## 2019-09-12 ENCOUNTER — Encounter: Payer: Self-pay | Admitting: Orthopaedic Surgery

## 2019-09-12 ENCOUNTER — Other Ambulatory Visit: Payer: Self-pay

## 2019-09-12 VITALS — BP 116/63 | HR 73 | Ht 61.0 in | Wt 116.0 lb

## 2019-09-12 DIAGNOSIS — M25562 Pain in left knee: Secondary | ICD-10-CM | POA: Diagnosis not present

## 2019-09-12 DIAGNOSIS — M545 Low back pain, unspecified: Secondary | ICD-10-CM

## 2019-09-12 DIAGNOSIS — G8929 Other chronic pain: Secondary | ICD-10-CM

## 2019-09-12 DIAGNOSIS — M25552 Pain in left hip: Secondary | ICD-10-CM

## 2019-09-12 NOTE — Progress Notes (Signed)
Office Visit Note   Patient: Heidi Moore           Date of Birth: Sep 01, 1919           MRN: [id number] Visit Date: 09/12/2019              Requested by: Caren Macadam, MD [place],   [id number] PCP: Caren Macadam, MD   Assessment & Plan: Visit Diagnoses:  1. Chronic left-sided low back pain, unspecified whether sciatica present   2. Pain in left hip   3. Left knee pain, unspecified chronicity     Plan: Patient can continue occasionally using Lidoderm gel occasional Aspercreme continue daily exercise program daily walking on her normal yard work. Would caution her to avoid excess heat and avoid prolonged strenuous activities. We reviewed radiographs that showed good position of her total knee arthroplasty on the left. She can follow-up with me as needed.  Follow-Up Instructions: No follow-ups on file.   Orders:  Orders Placed This Encounter  Procedures  . XR HIP UNILAT W OR W/O PELVIS 2-3 VIEWS LEFT  . XR Knee 1-2 Views Left  . XR Lumbar Spine 2-3 Views   No orders of the defined types were placed in this encounter.     Procedures: No procedures performed   Clinical Data: No additional findings.   Subjective: Chief Complaint  Patient presents with  . Lower Back - Pain  . Left Knee - Pain  . Left Hip - Pain    HPI 86 year old female returns she states that she fell in 2019 she had some soreness in her hip intermittently has been more symptomatic. She has had her vaccines and states after the vaccine she had more aching in her joints and muscles but then that resolved. She has been amatory with a cane when she was sore using her left hand. Patient still mows her yard still blows her yard uses a Chiropractor. She does daily leg exercises and demonstrated all the different exercises she does. Previous left total knee arthroplasty doing well occasionally she thinks it may be swelling some no problems with her opposite right knee.   Review of Systems Reviewed and updated unchanged noncontributory.  Objective: Vital Signs: BP 116/63   Pulse 73   Ht 5\' 1"  (1.549 m)   Wt 116 lb (52.6 kg)   BMI 21.92 kg/m   Physical Exam  Ortho Exam  Specialty Comments:  No specialty comments available.  Imaging: No results found.   PMFS History: Patient Active Problem List   Diagnosis Date Noted  . Rosacea 02/19/2014  . Osteoarthritis of right shoulder 09/11/2013    Class: Diagnosis of  . Macular degeneration - followed by optho, Dr. Brandy Hale 08/24/2012  . Hot flashes - followed by gyn, Dr. Gertie Fey 08/24/2012  . Osteoarthritis of left knee 05/17/2012    Class: Diagnosis of  . LOW BACK PAIN SYNDROME 05/07/2010  . VITAMIN D DEFICIENCY 04/22/2009  . Allergic rhinitis 03/12/2008  . Hyperlipemia 10/22/2006   Past Medical History:  Diagnosis Date  . AC (acromioclavicular) joint bone spurs    on right shoulder   . Allergic rhinitis   . Arthritis   . Bursitis    right shoulder  . Complication of anesthesia    headaches  . Diverticulosis   . DIVERTICULOSIS, COLON 10/15/2006   Qualifier: Diagnosis of  By: Linna Darner MD, Gwyndolyn Saxon Eye problem    treated by Dr Kathlen Mody per  patient  . Headache(784.0)    sinus HA  . History of blood transfusion 48yrs ago   no abnormal reaction noted  . History of cystitis   . History of skin cancer    Basal cell, Dr. Lindwood Coke  . Hyperlipidemia    [place] Study LDL goal =<160   . Hypotension   . Joint pain   . Joint swelling   . Macular degeneration, dry   . Osteopenia    Intolerance to Calcium,Actonel,Fosamax,Evista, Forteo: S/P Boniva 2007 to 03/2010  . OSTEOPENIA 03/12/2008   Qualifier: Diagnosis of  By: Velora Heckler    . Osteoporosis   . PONV (postoperative nausea and vomiting)   . Rosacea 04/22/2009   Qualifier: Diagnosis of  By: Linna Darner MD, Gwyndolyn Saxon    . Skin cancer 06/22/2017   per patient after excision right calf by Dr Ubaldo Glassing  . SKIN CANCER, HX OF 03/12/2008    Qualifier: Diagnosis of  By: Velora Heckler    . Urinary frequency   . Vaginal atrophy    sees Dr. Gertie Fey    Family History  Problem Relation Age of Onset  . Colon cancer Other        1st Cousin   . Stroke Mother 19  . Diabetes Brother   . Atrial fibrillation Brother        coumadin  . Lung cancer Sister     Past Surgical History:  Procedure Laterality Date  . APPENDECTOMY    . BUNIONECTOMY Bilateral   . CATARACT EXTRACTION, BILATERAL    . COLONOSCOPY     with Tics (no further f/u as per GI due to age)   . KNEE ARTHROPLASTY  05/18/2012   Procedure: COMPUTER ASSISTED TOTAL KNEE ARTHROPLASTY;  Surgeon: Marybelle Killings, MD;  Location: [facility] [facility];  Service: Orthopedics;  Laterality: Left;  Left Total Knee Arthroplasty, Cemented, Computer Assisted  . lt knee      meniscus tear  . MOHS SURGERY     right nostril for basel cell cancer   . NOSE SURGERY     due to trama at age 54   . TOTAL ABDOMINAL HYSTERECTOMY W/ BILATERAL SALPINGOOPHORECTOMY     for Endometriosis   . TOTAL SHOULDER ARTHROPLASTY Right 09/11/2013   Procedure: TOTAL SHOULDER ARTHROPLASTY;  Surgeon: Marybelle Killings, MD;  Location: [facility];  Service: Orthopedics;  Laterality: Right;  Right Total Shoulder Arthroplasty   Social History   Occupational History    Employer: RETIRED  Tobacco Use  . Smoking status: Never Smoker  . Smokeless tobacco: Never Used  Substance and Sexual Activity  . Alcohol use: No  . Drug use: No  . Sexual activity: Not on file

## 2019-09-15 ENCOUNTER — Telehealth: Payer: Self-pay | Admitting: Orthopaedic Surgery

## 2019-09-15 NOTE — Telephone Encounter (Signed)
Patient called advised she forgot to get a copy of her X-rays when she was at her appoinment. Patient asked if she could come by to get a copy of the X-rats? The number to contact patient is [phone]

## 2019-09-18 NOTE — Telephone Encounter (Signed)
Can you please help patient with this

## 2019-09-19 DIAGNOSIS — M545 Low back pain: Secondary | ICD-10-CM | POA: Diagnosis not present

## 2019-09-19 DIAGNOSIS — M9903 Segmental and somatic dysfunction of lumbar region: Secondary | ICD-10-CM | POA: Diagnosis not present

## 2019-09-19 DIAGNOSIS — M9901 Segmental and somatic dysfunction of cervical region: Secondary | ICD-10-CM | POA: Diagnosis not present

## 2019-09-19 DIAGNOSIS — M542 Cervicalgia: Secondary | ICD-10-CM | POA: Diagnosis not present

## 2019-09-21 ENCOUNTER — Telehealth: Payer: Self-pay | Admitting: Orthopaedic Surgery

## 2019-09-21 NOTE — Telephone Encounter (Signed)
Mailed AVS to patient per request. Patient also asked that the X-Rays she requested be mailed to her. See previous note. The number to contact patient is [phone] or [phone]

## 2019-09-28 ENCOUNTER — Other Ambulatory Visit: Payer: Self-pay

## 2019-09-28 ENCOUNTER — Ambulatory Visit
Admission: RE | Admit: 2019-09-28 | Discharge: 2019-09-28 | Disposition: A | Discharge status: Home / Self Care | Payer: Medicare PPO | Source: Ambulatory Visit | Attending: Neurology | Admitting: Neurology

## 2019-09-28 DIAGNOSIS — R42 Dizziness and giddiness: Secondary | ICD-10-CM

## 2019-09-28 DIAGNOSIS — R2681 Unsteadiness on feet: Secondary | ICD-10-CM | POA: Diagnosis not present

## 2019-09-28 DIAGNOSIS — H538 Other visual disturbances: Secondary | ICD-10-CM

## 2019-09-29 ENCOUNTER — Telehealth: Payer: Self-pay | Admitting: Neurology

## 2019-09-29 ENCOUNTER — Telehealth: Payer: Self-pay | Admitting: Family Medicine

## 2019-09-29 NOTE — Telephone Encounter (Signed)
Noted  

## 2019-09-29 NOTE — Telephone Encounter (Signed)
Pt advised of dr. Tomi Likens note

## 2019-09-29 NOTE — Telephone Encounter (Signed)
Patient had a missed call from [place] and is wondering if it was her MRI results. She would like a call back

## 2019-09-29 NOTE — Telephone Encounter (Signed)
Patient wanted to let Dr Ethlyn Gallery know that Dr Tomi Likens called and stated her MRI came back fine and that it was all age related.

## 2019-09-29 NOTE — Telephone Encounter (Signed)
CD of x-rays made and placed in box to be mailed out to patient.

## 2019-10-04 DIAGNOSIS — I83811 Varicose veins of right lower extremities with pain: Secondary | ICD-10-CM | POA: Diagnosis not present

## 2019-10-04 DIAGNOSIS — I8311 Varicose veins of right lower extremity with inflammation: Secondary | ICD-10-CM | POA: Diagnosis not present

## 2019-10-10 DIAGNOSIS — M545 Low back pain: Secondary | ICD-10-CM | POA: Diagnosis not present

## 2019-10-10 DIAGNOSIS — M542 Cervicalgia: Secondary | ICD-10-CM | POA: Diagnosis not present

## 2019-10-10 DIAGNOSIS — M9903 Segmental and somatic dysfunction of lumbar region: Secondary | ICD-10-CM | POA: Diagnosis not present

## 2019-10-10 DIAGNOSIS — M9901 Segmental and somatic dysfunction of cervical region: Secondary | ICD-10-CM | POA: Diagnosis not present

## 2019-10-31 DIAGNOSIS — M542 Cervicalgia: Secondary | ICD-10-CM | POA: Diagnosis not present

## 2019-10-31 DIAGNOSIS — M9901 Segmental and somatic dysfunction of cervical region: Secondary | ICD-10-CM | POA: Diagnosis not present

## 2019-10-31 DIAGNOSIS — M9903 Segmental and somatic dysfunction of lumbar region: Secondary | ICD-10-CM | POA: Diagnosis not present

## 2019-10-31 DIAGNOSIS — M545 Low back pain: Secondary | ICD-10-CM | POA: Diagnosis not present

## 2019-11-03 DIAGNOSIS — I8311 Varicose veins of right lower extremity with inflammation: Secondary | ICD-10-CM | POA: Diagnosis not present

## 2019-11-21 DIAGNOSIS — M9903 Segmental and somatic dysfunction of lumbar region: Secondary | ICD-10-CM | POA: Diagnosis not present

## 2019-11-21 DIAGNOSIS — M542 Cervicalgia: Secondary | ICD-10-CM | POA: Diagnosis not present

## 2019-11-21 DIAGNOSIS — M9901 Segmental and somatic dysfunction of cervical region: Secondary | ICD-10-CM | POA: Diagnosis not present

## 2019-11-21 DIAGNOSIS — M545 Low back pain: Secondary | ICD-10-CM | POA: Diagnosis not present

## 2019-11-23 DIAGNOSIS — H04123 Dry eye syndrome of bilateral lacrimal glands: Secondary | ICD-10-CM | POA: Diagnosis not present

## 2019-11-23 DIAGNOSIS — H524 Presbyopia: Secondary | ICD-10-CM | POA: Diagnosis not present

## 2019-11-23 DIAGNOSIS — H538 Other visual disturbances: Secondary | ICD-10-CM | POA: Diagnosis not present

## 2019-11-23 DIAGNOSIS — H401133 Primary open-angle glaucoma, bilateral, severe stage: Secondary | ICD-10-CM | POA: Diagnosis not present

## 2019-11-27 DIAGNOSIS — I8311 Varicose veins of right lower extremity with inflammation: Secondary | ICD-10-CM | POA: Diagnosis not present

## 2019-11-27 DIAGNOSIS — M7981 Nontraumatic hematoma of soft tissue: Secondary | ICD-10-CM | POA: Diagnosis not present

## 2019-12-01 DIAGNOSIS — H401131 Primary open-angle glaucoma, bilateral, mild stage: Secondary | ICD-10-CM | POA: Diagnosis not present

## 2019-12-11 DIAGNOSIS — M7981 Nontraumatic hematoma of soft tissue: Secondary | ICD-10-CM | POA: Diagnosis not present

## 2019-12-11 DIAGNOSIS — I8311 Varicose veins of right lower extremity with inflammation: Secondary | ICD-10-CM | POA: Diagnosis not present

## 2019-12-14 DIAGNOSIS — N952 Postmenopausal atrophic vaginitis: Secondary | ICD-10-CM | POA: Diagnosis not present

## 2019-12-14 DIAGNOSIS — M8580 Other specified disorders of bone density and structure, unspecified site: Secondary | ICD-10-CM | POA: Diagnosis not present

## 2019-12-14 DIAGNOSIS — N951 Menopausal and female climacteric states: Secondary | ICD-10-CM | POA: Diagnosis not present

## 2019-12-14 DIAGNOSIS — Z6821 Body mass index (BMI) 21.0-21.9, adult: Secondary | ICD-10-CM | POA: Diagnosis not present

## 2019-12-14 DIAGNOSIS — Z01419 Encounter for gynecological examination (general) (routine) without abnormal findings: Secondary | ICD-10-CM | POA: Diagnosis not present

## 2019-12-26 DIAGNOSIS — I8312 Varicose veins of left lower extremity with inflammation: Secondary | ICD-10-CM | POA: Diagnosis not present

## 2019-12-26 DIAGNOSIS — I8311 Varicose veins of right lower extremity with inflammation: Secondary | ICD-10-CM | POA: Diagnosis not present

## 2020-01-01 ENCOUNTER — Telehealth: Payer: Self-pay | Admitting: Family Medicine

## 2020-01-01 DIAGNOSIS — M79606 Pain in leg, unspecified: Secondary | ICD-10-CM

## 2020-01-01 NOTE — Telephone Encounter (Signed)
Ok. She wanted this before. She has had other eval without answers. Make sure no change since she was here last. Use dx of leg pain.

## 2020-01-01 NOTE — Telephone Encounter (Signed)
Spoke with the pt she stated her symptoms are the same.  Patient was informed the referral was placed and someone will call with appt info.

## 2020-01-01 NOTE — Telephone Encounter (Signed)
Pt is asking for a referral rheumatoid specialist. She is inflammation in legs.  Please call pt at [phone]

## 2020-01-08 ENCOUNTER — Telehealth: Payer: Medicare PPO | Admitting: Family Medicine

## 2020-01-09 DIAGNOSIS — H524 Presbyopia: Secondary | ICD-10-CM | POA: Diagnosis not present

## 2020-01-09 DIAGNOSIS — H401133 Primary open-angle glaucoma, bilateral, severe stage: Secondary | ICD-10-CM | POA: Diagnosis not present

## 2020-01-09 DIAGNOSIS — H538 Other visual disturbances: Secondary | ICD-10-CM | POA: Diagnosis not present

## 2020-01-09 DIAGNOSIS — H04123 Dry eye syndrome of bilateral lacrimal glands: Secondary | ICD-10-CM | POA: Diagnosis not present

## 2020-01-16 ENCOUNTER — Other Ambulatory Visit: Payer: Self-pay

## 2020-01-16 ENCOUNTER — Telehealth (INDEPENDENT_AMBULATORY_CARE_PROVIDER_SITE_OTHER): Payer: Medicare PPO | Admitting: Family Medicine

## 2020-01-16 DIAGNOSIS — L299 Pruritus, unspecified: Secondary | ICD-10-CM

## 2020-01-16 DIAGNOSIS — M79605 Pain in left leg: Secondary | ICD-10-CM

## 2020-01-16 DIAGNOSIS — M79604 Pain in right leg: Secondary | ICD-10-CM

## 2020-01-16 DIAGNOSIS — R600 Localized edema: Secondary | ICD-10-CM | POA: Diagnosis not present

## 2020-01-16 NOTE — Progress Notes (Signed)
Virtual Visit via Telephone Note  I connected with [name] on 01/16/20 at 11:40 AM EDT by telephone and verified that I am speaking with the correct person using two identifiers.   I discussed the limitations, risks, security and privacy concerns of performing an evaluation and management service by telephone and the availability of in person appointments. I also discussed with the patient that there may be a patient responsible charge related to this service. The patient expressed understanding and agreed to proceed.  Location patient: home Location provider: work or home office Participants present for the call: patient, provider Patient did not have a visit in the prior 7 days to address this/these issue(s).   History of Present Illness:   Acute visit for itchy and burning skin in the lower extremities. This has been going on for almost 1 year. She has seen her PCP, an allergist and a dermatologist for this. Also, her PCP advised seeing a vein specialist as she also gets some swelling in the legs. Reports she was seen at [facility] in June. She had to wear hose for 6 weeks and then she had a vein treatment in her R leg in June and July. She has had extensive lab and urine workup per her report. Had mildly off thyroid, but did not tolerate treatment. She also was referred to rheumatologist recently by her PCP. Currently both legs are swollen, she has pain and tight feelings in both legs around the knees at times - worse at night. She has a history of OA and has had knee surgery on the L leg per her report. Denies fevers, malaise, worsening. Walking and riding her bike daily.  Observations/Objective: Patient sounds cheerful and well on the phone. I do not appreciate any SOB. Speech and thought processing are grossly intact. Patient reported vitals:  Assessment and Plan:  No diagnosis found.  -we discussed possible serious and likely etiologies, options for evaluation and  workup, limitations of telemedicine visit vs in person visit, treatment, treatment risks and precautions. Pt prefers to treat via telemedicine empirically rather then risking or undertaking an in person visit at this moment. Query neurological cause to the pruritis/burning sensation or neurodermatitis. Possible contribution of OA to the knee pain at night - better with moving. Also has reported chronic LE edema contributing. Opted for trial tylenol before bed, capsaicin, compression and elevation of legs twice daily, continued gentle exercise and follow up with PCP in 2 weeks. She wants to start a fluid pill - but I am unable to examine her fluid status by phone and feel her vein specialist would have suggested this if this was needed. Advised inperson follow up in 2 weeks. May benefit from neuro exam as well.  Advised to seek prompt follow up telemedicine visit or in person care sooner if worsening, new symptoms arise, or if is not improving with treatment.  Follow Up Instructions:  I did not refer this patient for an OV in the next 24 hours for this/these issue(s).  I discussed the assessment and treatment plan with the patient. The patient was provided an opportunity to ask questions and all were answered. The patient agreed with the plan and demonstrated an understanding of the instructions.   I provided 23 minutes of non-face-to-face time during this encounter.   Lucretia Kern, DO

## 2020-01-16 NOTE — Patient Instructions (Signed)
-  trial tylenol 500-100mg  before bed  -trial elevated legs twice daily for 20-30 minutes  -compression during the day  -could try topical capsaicin sports medicine cream as well as this sometimes helps with neurological burning sensations - this is available over the counter.  -follow up inperson at your primary care office in 2 weeks - call for appointment if they do not contact you.

## 2020-01-22 ENCOUNTER — Telehealth: Payer: Self-pay | Admitting: Family Medicine

## 2020-01-22 DIAGNOSIS — R600 Localized edema: Secondary | ICD-10-CM

## 2020-01-22 NOTE — Telephone Encounter (Signed)
Pt is calling to see if she can get a referral to a specialist for her consistent swelling and retaining fluids.

## 2020-01-22 NOTE — Telephone Encounter (Signed)
Recently saw Dr. Maudie Mercury for virtual visit [facility])  She has upcoming appointment with rheumatology, but if getting more swelling, would typically be cardiology that she would see. However, I would suggest that we consider lab work/evaluation before seeing them if interested. If she just wants to see cardiology though; I am ok with that. From Dr. Julianne Rice note she was not wanting to go into office, but cardiology would definitely want to see her in person for evaluation if retaining fluids.

## 2020-01-23 NOTE — Telephone Encounter (Signed)
Left a message for the pt to return my call.  

## 2020-01-23 NOTE — Addendum Note (Signed)
Addended by: Agnes Lawrence on: 01/23/2020 02:11 PM   Modules accepted: Orders

## 2020-01-23 NOTE — Telephone Encounter (Signed)
Spoke with the pt and informed her of the message below.  Patient is aware the referral was entered and someone will call with appt info.

## 2020-01-25 NOTE — Progress Notes (Signed)
Electrophysiology Office Note:    Date:  01/26/2020   ID:  Heidi Moore, DOB 05/17/1919, MRN [id number]  PCP:  Heidi Macadam, MD  [facility] Cardiologist:  No primary care provider on file.  [facility] Electrophysiologist:  None   Referring MD: Heidi Macadam, MD   Chief Complaint: LE swelling  History of Present Illness:    Heidi Moore is a 86 y.o. female who presents for an evaluation of lower extremity swelling at the request of Dr Ethlyn Gallery. She tells me this is a chronic issue for which she has been seen by local vein specialists. She is a very active woman, still driving. She works around the house without any difficulty breathing. The lower extremity swelling improves with compression hose but then worsens when she is up and moving.  She also is complaining of a flushing feeling that she gets intermittently. She is a difficult historian and it is difficult to tease out when these episodes are worst.   Past Medical History:  Diagnosis Date  . AC (acromioclavicular) joint bone spurs    on right shoulder   . Allergic rhinitis   . Arthritis   . Bursitis    right shoulder  . Complication of anesthesia    headaches  . Diverticulosis   . DIVERTICULOSIS, COLON 10/15/2006   Qualifier: Diagnosis of  By: Linna Darner MD, Gwyndolyn Saxon Eye problem    treated by Dr Kathlen Mody per patient  . Headache(784.0)    sinus HA  . History of blood transfusion 3yrs ago   no abnormal reaction noted  . History of cystitis   . History of skin cancer    Basal cell, Dr. Lindwood Coke  . Hyperlipidemia    [place] Study LDL goal =<160   . Hypotension   . Joint pain   . Joint swelling   . Macular degeneration, dry   . Osteopenia    Intolerance to Calcium,Actonel,Fosamax,Evista, Forteo: S/P Boniva 2007 to 03/2010  . OSTEOPENIA 03/12/2008   Qualifier: Diagnosis of  By: Velora Heckler    . Osteoporosis   . PONV (postoperative nausea and vomiting)   . Rosacea 04/22/2009    Qualifier: Diagnosis of  By: Linna Darner MD, Gwyndolyn Saxon    . Skin cancer 06/22/2017   per patient after excision right calf by Dr Ubaldo Glassing  . SKIN CANCER, HX OF 03/12/2008   Qualifier: Diagnosis of  By: Velora Heckler    . Urinary frequency   . Vaginal atrophy    sees Dr. Gertie Fey    Past Surgical History:  Procedure Laterality Date  . APPENDECTOMY    . BUNIONECTOMY Bilateral   . CATARACT EXTRACTION, BILATERAL    . COLONOSCOPY     with Tics (no further f/u as per GI due to age)   . KNEE ARTHROPLASTY  05/18/2012   Procedure: COMPUTER ASSISTED TOTAL KNEE ARTHROPLASTY;  Surgeon: Marybelle Killings, MD;  Location: [place];  Service: Orthopedics;  Laterality: Left;  Left Total Knee Arthroplasty, Cemented, Computer Assisted  . lt knee      meniscus tear  . MOHS SURGERY     right nostril for basel cell cancer   . NOSE SURGERY     due to trama at age 13   . TOTAL ABDOMINAL HYSTERECTOMY W/ BILATERAL SALPINGOOPHORECTOMY     for Endometriosis   . TOTAL SHOULDER ARTHROPLASTY Right 09/11/2013   Procedure: TOTAL SHOULDER ARTHROPLASTY;  Surgeon: Marybelle Killings, MD;  Location: [place];  Service:  Orthopedics;  Laterality: Right;  Right Total Shoulder Arthroplasty    Current Medications: Current Meds  Medication Sig  . acetaminophen (TYLENOL) 325 MG tablet Take 650 mg by mouth every 6 (six) hours as needed for mild pain or headache.  . B Complex-C (B-COMPLEX WITH VITAMIN C) tablet Take 1 tablet by mouth daily.  . cholecalciferol (VITAMIN D) 1000 UNITS tablet Take 1,000 Units by mouth 2 (two) times daily.   [name] estradiol (ESTRACE) 0.5 MG tablet Take 0.5 mg by mouth daily.   [name] OVER THE COUNTER MEDICATION Take 1 capsule by mouth 2 (two) times daily. Leming7 daily.  [name] OVER THE COUNTER MEDICATION 2 (two) times daily. Preservision vitamin for eyes  . Polyethyl Glycol-Propyl Glycol (SYSTANE OP) Apply to eye daily.  . vitamin E 400 UNIT capsule Take 1 capsule by mouth daily.     Allergies:   Alendronate sodium,  Anaplex hd, Aspirin, Azithromycin, Calcium, Ciprofloxacin, Codeine, Dorzolamide hcl-timolol mal, Doxycycline, Hydrocodone, Ibandronate sodium, Ibuprofen, Penicillins, Raloxifene, Risedronate sodium, Sulfonamide derivatives, and Teriparatide   Social History   Socioeconomic History  . Marital status: Widowed    Spouse name: Not on file  . Number of children: 3  . Years of education: Not on file  . Highest education level: Some college, no degree  Occupational History    Employer: RETIRED  Tobacco Use  . Smoking status: Never Smoker  . Smokeless tobacco: Never Used  Vaping Use  . Vaping Use: Never used  Substance and Sexual Activity  . Alcohol use: No  . Drug use: No  . Sexual activity: Not on file  Other Topics Concern  . 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 She lives alone. She goes to the gym and participates in aerobics 3 x week. She drinks 3 cups of coffee a day.         Social Determinants of Health   Financial Resource Strain:   . Difficulty of Paying Living Expenses: Not on file  Food Insecurity:   . Worried About Charity fundraiser in the Last Year: Not on file  . Ran Out of Food in the Last Year: Not on file  Transportation Needs:   . Lack of Transportation (Medical): Not on file  . Lack of Transportation (Non-Medical): Not on file  Physical Activity:   . Days of Exercise per Week: Not on file  . Minutes of Exercise per Session: Not on file  Stress:   . Feeling of Stress : Not on file  Social  Connections:   . Frequency of Communication with Friends and Family: Not on file  . Frequency of Social Gatherings with Friends and Family: Not on file  . Attends Religious Services: Not on file  . Active Member of Clubs or Organizations: Not on file  . Attends Archivist Meetings: Not on file  . Marital Status: Not on file     Family  History: The patient's family history includes Atrial fibrillation in her brother; Colon cancer in an other family member; Diabetes in her brother; Lung cancer in her sister; Stroke (age of onset: 41) in her mother.  ROS:   Please see the history of present illness.    All other systems reviewed and are negative.  EKGs/Labs/Other Studies Reviewed:    The following studies were reviewed today: ECG  EKG:  The ekg ordered today demonstrates sinus rhythm.  Recent Labs: 06/18/2019: ALT 18; BUN 11; Creatinine, Ser 0.88; Hemoglobin 13.9; Platelets 175; Potassium 3.6; Sodium 141 07/28/2019: TSH 4.73  Recent Lipid Panel    Component Value Date/Time   CHOL 174 12/14/2018 1006   TRIG 67.0 12/14/2018 1006   HDL 67.60 12/14/2018 1006   CHOLHDL 3 12/14/2018 1006   VLDL 13.4 12/14/2018 1006   LDLCALC 93 12/14/2018 1006    Physical Exam:    VS:  BP 124/64   Pulse 87   Ht 5\' 1"  (1.549 m)   Wt 109 lb 3.2 oz (49.5 kg)   SpO2 95%   BMI 20.63 kg/m     Wt Readings from Last 3 Encounters:  01/26/20 109 lb 3.2 oz (49.5 kg)  09/12/19 116 lb (52.6 kg)  08/28/19 116 lb 3.2 oz (52.7 kg)     GEN: Well nourished, well developed in no acute distress HEENT: Normal NECK: No JVD; No carotid bruits LYMPHATICS: No lymphadenopathy CARDIAC: RRR, no murmurs, rubs, gallops RESPIRATORY:  Clear to auscultation without rales, wheezing or rhonchi  ABDOMEN: Soft, non-tender, non-distended MUSCULOSKELETAL:  Trace pitting edema in bilateral ankles and distal lower extremities (1-2 inches above shoe); No deformity  SKIN: Warm and dry NEUROLOGIC:  Alert and oriented  x 3 PSYCHIATRIC:  Normal affect   ASSESSMENT:    1. Lower extremity edema    PLAN:    In order of problems listed above:  1. Lower extremity edema Discussed at length with the patient and her daughter. Her symptoms and physican exam are consistent with venous insufficiency. I do suspect a primary cardiac dysfunction as the cause for her edema. I am inclined to treat this conservatively given the mild severity of the edema and the patient's age. This was discussed at length with the patient and her daughter who are in agreement. I have recommended that she use compression stockings during the daytime hours and then remove them in the evening. She should avoid self medicating with potassium and magnesium supplements as this may be dangerous and is unlikely to have any benefit.    Medication Adjustments/Labs and Tests Ordered: Current medicines are reviewed at length with the patient today.  Concerns regarding medicines are outlined above.  Orders Placed This Encounter  Procedures  . EKG 12-Lead   No orders of the defined types were placed in this encounter.  I have spent 48 minutes in care the care of this patient.  Signed, Lars Mage, MD, [facility]  01/26/2020 3:45 PM    Electrophysiology [facility]

## 2020-01-26 ENCOUNTER — Encounter: Payer: Self-pay | Admitting: Cardiology

## 2020-01-26 ENCOUNTER — Ambulatory Visit: Payer: Medicare PPO | Admitting: Cardiology

## 2020-01-26 ENCOUNTER — Other Ambulatory Visit: Payer: Self-pay

## 2020-01-26 DIAGNOSIS — R600 Localized edema: Secondary | ICD-10-CM | POA: Insufficient documentation

## 2020-01-26 DIAGNOSIS — I872 Venous insufficiency (chronic) (peripheral): Secondary | ICD-10-CM | POA: Insufficient documentation

## 2020-01-26 DIAGNOSIS — R609 Edema, unspecified: Secondary | ICD-10-CM | POA: Insufficient documentation

## 2020-01-26 NOTE — Patient Instructions (Addendum)

## 2020-01-29 ENCOUNTER — Telehealth: Payer: Self-pay | Admitting: Family Medicine

## 2020-01-29 NOTE — Telephone Encounter (Signed)
Patient is needing a referral to a circulation specialist.   She would like for you to call her with the name and number of the place that she is being referred to so she can call them and schedule her appointment.  Please advise

## 2020-01-29 NOTE — Telephone Encounter (Signed)
I left a detailed message with the information below at the pts home number and asked she return a call if needed.

## 2020-01-29 NOTE — Telephone Encounter (Signed)
Pt is wanting a referral to see a circulation Dr. She wants someone to call her with the appointment.  Please advise

## 2020-01-29 NOTE — Telephone Encounter (Signed)
Referral was placed last week for cardiology? Not sure if she is wanting to see someone else but she requested cardiology. I do think she saw vein specialist on her own as well? (or may have just talked about doing that). If she wants something different than cardio, let me know.

## 2020-01-31 NOTE — Telephone Encounter (Signed)
Spoke with the pt and offered to give her the number for cardiology.  Patient stated she as already seen by the heart doctor and he told her to see Dr Ethlyn Gallery.  I read the office notes from cardiology which stated to wear compression hose.  Patient stated she was wearing hose at night and wanted a referral to a circulation specialist. I scheduled an appt with the PCP to discuss this on 10/1 as the pt has seen a vascular specialist and now cardiology.

## 2020-02-01 ENCOUNTER — Ambulatory Visit: Payer: Medicare PPO | Admitting: Cardiology

## 2020-02-01 NOTE — Telephone Encounter (Signed)
Pt called back to say that she doesn't need to see Dr. Ethlyn Gallery because she is not a circulation Dr. She needs a referral to a circulation Dr. Because she can't sleep past 4 hours and she just can't take it anymore. Leave the information her answering machine

## 2020-02-02 NOTE — Telephone Encounter (Signed)
Attempted to contact patient. No answer. LVM for patient to return call.  

## 2020-02-02 NOTE — Telephone Encounter (Signed)
She Saw Dr. Patrecia Pour in April at [facility]. He is a vascular doctor and he recommended a follow up with thoughts for further evaluation pending her response to compression stockings. She doesn't need a referral; she just needs to call back [phone] and schedule a follow up visit with him.   I agree that she doesn't need to see me for this as there is nothing further I can offer.

## 2020-02-05 NOTE — Telephone Encounter (Signed)
Spoke with the pt and informed her of the message below.  Patient stated she has an appt with [facility] on 10/11 and was advised the appt on 10/1 with Dr Ethlyn Gallery was cancelled.  Patient asked what to do for the flushing she has and I advised she contact the allergist as the referral was placed for this and she has seen them previously.

## 2020-02-05 NOTE — Telephone Encounter (Signed)
Patient returned [place] call

## 2020-02-08 NOTE — Progress Notes (Deleted)
Office Visit Note  Patient: Heidi Moore             Date of Birth: 26-Jul-1919           MRN: [id number]             PCP: Caren Macadam, MD Referring: Caren Macadam, MD Visit Date: 02/20/2020 Occupation: @GUAROCC @  Subjective:  No chief complaint on file.   History of Present Illness: Heidi Moore is a 86 y.o. female ***   Activities of Daily Living:  Patient reports morning stiffness for *** {minute/hour:19697}.   Patient {ACTIONS;DENIES/REPORTS:21021675::"Denies"} nocturnal pain.  Difficulty dressing/grooming: {ACTIONS;DENIES/REPORTS:21021675::"Denies"} Difficulty climbing stairs: {ACTIONS;DENIES/REPORTS:21021675::"Denies"} Difficulty getting out of chair: {ACTIONS;DENIES/REPORTS:21021675::"Denies"} Difficulty using hands for taps, buttons, cutlery, and/or writing: {ACTIONS;DENIES/REPORTS:21021675::"Denies"}  No Rheumatology ROS completed.   PMFS History:  Patient Active Problem List   Diagnosis Date Noted  . Edema 01/26/2020  . Lower extremity edema 01/26/2020  . Rosacea 02/19/2014  . Osteoarthritis of right shoulder 09/11/2013    Class: Diagnosis of  . Macular degeneration - followed by optho, Dr. Brandy Hale 08/24/2012  . Hot flashes - followed by gyn, Dr. Gertie Fey 08/24/2012  . Osteoarthritis of left knee 05/17/2012    Class: Diagnosis of  . LOW BACK PAIN SYNDROME 05/07/2010  . VITAMIN D DEFICIENCY 04/22/2009  . Allergic rhinitis 03/12/2008  . Hyperlipemia 10/22/2006    Past Medical History:  Diagnosis Date  . AC (acromioclavicular) joint bone spurs    on right shoulder   . Allergic rhinitis   . Arthritis   . Bursitis    right shoulder  . Complication of anesthesia    headaches  . Diverticulosis   . DIVERTICULOSIS, COLON 10/15/2006   Qualifier: Diagnosis of  By: Linna Darner MD, Gwyndolyn Saxon Eye problem    treated by Dr Kathlen Mody per patient  . Headache(784.0)    sinus HA  . History of blood transfusion 62yrs ago   no abnormal reaction noted  .  History of cystitis   . History of skin cancer    Basal cell, Dr. Lindwood Coke  . Hyperlipidemia    [place] Study LDL goal =<160   . Hypotension   . Joint pain   . Joint swelling   . Macular degeneration, dry   . Osteopenia    Intolerance to Calcium,Actonel,Fosamax,Evista, Forteo: S/P Boniva 2007 to 03/2010  . OSTEOPENIA 03/12/2008   Qualifier: Diagnosis of  By: Velora Heckler    . Osteoporosis   . PONV (postoperative nausea and vomiting)   . Rosacea 04/22/2009   Qualifier: Diagnosis of  By: Linna Darner MD, Gwyndolyn Saxon    . Skin cancer 06/22/2017   per patient after excision right calf by Dr Ubaldo Glassing  . SKIN CANCER, HX OF 03/12/2008   Qualifier: Diagnosis of  By: Velora Heckler    . Urinary frequency   . Vaginal atrophy    sees Dr. Gertie Fey    Family History  Problem Relation Age of Onset  . Colon cancer Other        1st Cousin   . Stroke Mother 69  . Diabetes Brother   . Atrial fibrillation Brother        coumadin  . Lung cancer Sister    Past Surgical History:  Procedure Laterality Date  . APPENDECTOMY    . BUNIONECTOMY Bilateral   . CATARACT EXTRACTION, BILATERAL    . COLONOSCOPY     with Tics (no further f/u as per GI due to age)   .  KNEE ARTHROPLASTY  05/18/2012   Procedure: COMPUTER ASSISTED TOTAL KNEE ARTHROPLASTY;  Surgeon: Marybelle Killings, MD;  Location: [place];  Service: Orthopedics;  Laterality: Left;  Left Total Knee Arthroplasty, Cemented, Computer Assisted  . lt knee      meniscus tear  . MOHS SURGERY     right nostril for basel cell cancer   . NOSE SURGERY     due to trama at age 46   . TOTAL ABDOMINAL HYSTERECTOMY W/ BILATERAL SALPINGOOPHORECTOMY     for Endometriosis   . 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 She lives alone. She goes to the gym and participates in aerobics 3 x week. She drinks 3 cups of coffee a day.         Immunization History  Administered Date(s) Administered  . Influenza Split 02/13/2013  . Influenza Whole 01/31/2008  . Influenza, High Dose Seasonal PF 01/31/2016, 01/21/2019  . Influenza-Unspecified 01/09/2014, 01/24/2017, 02/09/2018  . PFIZER SARS-COV-2 Vaccination 06/15/2019, 08/07/2019  . Pneumococcal Polysaccharide-23 05/11/1996  . Td 05/11/1994  . Tdap 10/30/2013  . Zoster 11/04/2010     Objective: Vital Signs: There were no vitals taken for this visit.   Physical Exam   Musculoskeletal Exam: ***  CDAI Exam: CDAI Score: -- Patient Global: --; Provider Global: -- Swollen: --; Tender: -- Joint Exam 02/20/2020   No joint exam has been documented for this visit   There is currently no information documented on the homunculus. Go to the Rheumatology activity and complete the homunculus joint exam.  Investigation: No additional findings.  Imaging: No results found.  Recent Labs: Lab Results  Component Value Date   WBC 6.1 06/18/2019   HGB 13.9 06/18/2019   PLT 175 06/18/2019   NA 141 06/18/2019   K 3.6 06/18/2019   CL 106 06/18/2019   CO2 23 06/18/2019   GLUCOSE 91 06/18/2019   BUN 11 06/18/2019   CREATININE 0.88 06/18/2019   BILITOT 0.8 06/18/2019   ALKPHOS 44 06/18/2019   AST 20 06/18/2019   ALT 18 06/18/2019   PROT 6.6 06/18/2019   ALBUMIN 3.9 06/18/2019   CALCIUM 9.3 06/18/2019   GFRAA >60 06/18/2019     Speciality Comments: No specialty comments available.  Procedures:  No procedures performed Allergies: Alendronate sodium, Anaplex hd, Aspirin, Azithromycin, Calcium, Ciprofloxacin, Codeine, Dorzolamide hcl-timolol mal, Doxycycline, Hydrocodone, Ibandronate sodium, Ibuprofen, Penicillins, Raloxifene, Risedronate sodium, Sulfonamide derivatives, and Teriparatide   Assessment / Plan:     Visit Diagnoses: No diagnosis found.  Orders: No orders of the defined types were placed in this encounter.  No orders of the defined types were placed in this encounter.   Face-to-face time spent with patient was *** minutes. Greater than 50% of time  was spent in counseling and coordination of care.  Follow-Up Instructions: No follow-ups on file.   Ofilia Neas, [name]  Note - This record has been created using Dragon software.  Chart creation errors have been sought, but may not always  have been located. Such creation errors do not reflect on  the standard of medical care.

## 2020-02-09 ENCOUNTER — Ambulatory Visit: Payer: Medicare PPO | Admitting: Family Medicine

## 2020-02-09 ENCOUNTER — Telehealth: Payer: Self-pay | Admitting: Cardiology

## 2020-02-09 ENCOUNTER — Telehealth: Payer: Self-pay | Admitting: Family Medicine

## 2020-02-09 NOTE — Telephone Encounter (Signed)
Patient is calling stating that at her last visit she was told her issue was do to poor circulation and was told she needs to go to a circulation doctor.  She would like to know of one in the area.

## 2020-02-09 NOTE — Telephone Encounter (Signed)
Returned call to Pt.  Advised Dr. Quentin Ore had said she had venous insufficiency, which is the circulation issue he was referring to.  Advised Pt to elevate her legs as much as possible.  Advised if she was not actively walking or using bike she should elevate her legs.  Encouraged her to continue to use cold compresses at night as needed.  She will continue to follow with [facility] for further tx.

## 2020-02-09 NOTE — Telephone Encounter (Signed)
Spoke with the pt and she stated she is having the same problem.  Stated she called the vein specialist today three times and the doctor will be back on Tuesday.  Message sent to PCP to call the pt.

## 2020-02-09 NOTE — Telephone Encounter (Signed)
Please see if there is something new going on/change in status? See prior messages; should be following with vascular.

## 2020-02-09 NOTE — Telephone Encounter (Signed)
Pt is calling; she has concerns about retaining fluid in her legs and burning sensation. pt would like a call  [phone]

## 2020-02-12 NOTE — Telephone Encounter (Signed)
Ok to follow up with vascular.

## 2020-02-20 ENCOUNTER — Ambulatory Visit: Payer: Medicare PPO | Admitting: Rheumatology

## 2020-03-06 ENCOUNTER — Ambulatory Visit (INDEPENDENT_AMBULATORY_CARE_PROVIDER_SITE_OTHER): Payer: Medicare PPO

## 2020-03-06 ENCOUNTER — Telehealth: Payer: Self-pay | Admitting: Family Medicine

## 2020-03-06 DIAGNOSIS — Z0000 Encounter for general adult medical examination without abnormal findings: Secondary | ICD-10-CM | POA: Diagnosis not present

## 2020-03-06 NOTE — Patient Instructions (Signed)
Heidi Moore , Thank you for taking time to come for your Medicare Wellness Visit. I appreciate your ongoing commitment to your health goals. Please review the following plan we discussed and let me know if I can assist you in the future.   Screening recommendations/referrals: Colonoscopy: No longer required  Mammogram: No longer required  Bone Density: No longer required Recommended yearly ophthalmology/optometry visit for glaucoma screening and checkup Recommended yearly dental visit for hygiene and checkup  Vaccinations: Influenza vaccine: Currently due, you may receive at your next in person office visit  Pneumococcal vaccine: completed series Tdap vaccine: Up to date, next due 10/31/2023 /Shingles vaccine: Currently due for shingrix, you may receive at your local pharmacy.    /Advanced directives: Copies on file  Conditions/risks identified: None   Next appointment: None    Preventive Care 65 Years and Older, Female Preventive care refers to lifestyle choices and visits with your health care provider that can promote health and wellness. What does preventive care include?  A yearly physical exam. This is also called an annual well check.  Dental exams once or twice a year.  Routine eye exams. Ask your health care provider how often you should have your eyes checked.  Personal lifestyle choices, including:  Daily care of your teeth and gums.  Regular physical activity.  Eating a healthy diet.  Avoiding tobacco and drug use.  Limiting alcohol use.  Practicing safe sex.  Taking low-dose aspirin every day.  Taking vitamin and mineral supplements as recommended by your health care provider. What happens during an annual well check? The services and screenings done by your health care provider during your annual well check will depend on your age, overall health, lifestyle risk factors, and family history of disease. Counseling  Your health care provider may ask you  questions about your:  Alcohol use.  Tobacco use.  Drug use.  Emotional well-being.  Home and relationship well-being.  Sexual activity.  Eating habits.  History of falls.  Memory and ability to understand (cognition).  Work and work Statistician.  Reproductive health. Screening  You may have the following tests or measurements:  Height, weight, and BMI.  Blood pressure.  Lipid and cholesterol levels. These may be checked every 5 years, or more frequently if you are over 60 years old.  Skin check.  Lung cancer screening. You may have this screening every year starting at age 58 if you have a 30-pack-year history of smoking and currently smoke or have quit within the past 15 years.  Fecal occult blood test (FOBT) of the stool. You may have this test every year starting at age 81.  Flexible sigmoidoscopy or colonoscopy. You may have a sigmoidoscopy every 5 years or a colonoscopy every 10 years starting at age 71.  Hepatitis C blood test.  Hepatitis B blood test.  Sexually transmitted disease (STD) testing.  Diabetes screening. This is done by checking your blood sugar (glucose) after you have not eaten for a while (fasting). You may have this done every 1-3 years.  Bone density scan. This is done to screen for osteoporosis. You may have this done starting at age 1.  Mammogram. This may be done every 1-2 years. Talk to your health care provider about how often you should have regular mammograms. Talk with your health care provider about your test results, treatment options, and if necessary, the need for more tests. Vaccines  Your health care provider may recommend certain vaccines, such as:  Influenza vaccine.  This is recommended every year.  Tetanus, diphtheria, and acellular pertussis (Tdap, Td) vaccine. You may need a Td booster every 10 years.  Zoster vaccine. You may need this after age 71.  Pneumococcal 13-valent conjugate (PCV13) vaccine. One dose is  recommended after age 54.  Pneumococcal polysaccharide (PPSV23) vaccine. One dose is recommended after age 76. Talk to your health care provider about which screenings and vaccines you need and how often you need them. This information is not intended to replace advice given to you by your health care provider. Make sure you discuss any questions you have with your health care provider. Document Released: 05/24/2015 Document Revised: 01/15/2016 Document Reviewed: 02/26/2015 Elsevier Interactive Patient Education  2017 [place] Prevention in the Home Falls can cause injuries. They can happen to people of all ages. There are many things you can do to make your home safe and to help prevent falls. What can I do on the outside of my home?  Regularly fix the edges of walkways and driveways and fix any cracks.  Remove anything that might make you trip as you walk through a door, such as a raised step or threshold.  Trim any bushes or trees on the path to your home.  Use bright outdoor lighting.  Clear any walking paths of anything that might make someone trip, such as rocks or tools.  Regularly check to see if handrails are loose or broken. Make sure that both sides of any steps have handrails.  Any raised decks and porches should have guardrails on the edges.  Have any leaves, snow, or ice cleared regularly.  Use sand or salt on walking paths during winter.  Clean up any spills in your garage right away. This includes oil or grease spills. What can I do in the bathroom?  Use night lights.  Install grab bars by the toilet and in the tub and shower. Do not use towel bars as grab bars.  Use non-skid mats or decals in the tub or shower.  If you need to sit down in the shower, use a plastic, non-slip stool.  Keep the floor dry. Clean up any water that spills on the floor as soon as it happens.  Remove soap buildup in the tub or shower regularly.  Attach bath mats  securely with double-sided non-slip rug tape.  Do not have throw rugs and other things on the floor that can make you trip. What can I do in the bedroom?  Use night lights.  Make sure that you have a light by your bed that is easy to reach.  Do not use any sheets or blankets that are too big for your bed. They should not hang down onto the floor.  Have a firm chair that has side arms. You can use this for support while you get dressed.  Do not have throw rugs and other things on the floor that can make you trip. What can I do in the kitchen?  Clean up any spills right away.  Avoid walking on wet floors.  Keep items that you use a lot in easy-to-reach places.  If you need to reach something above you, use a strong step stool that has a grab bar.  Keep electrical cords out of the way.  Do not use floor polish or wax that makes floors slippery. If you must use wax, use non-skid floor wax.  Do not have throw rugs and other things on the floor that can make  you trip. What can I do with my stairs?  Do not leave any items on the stairs.  Make sure that there are handrails on both sides of the stairs and use them. Fix handrails that are broken or loose. Make sure that handrails are as long as the stairways.  Check any carpeting to make sure that it is firmly attached to the stairs. Fix any carpet that is loose or worn.  Avoid having throw rugs at the top or bottom of the stairs. If you do have throw rugs, attach them to the floor with carpet tape.  Make sure that you have a light switch at the top of the stairs and the bottom of the stairs. If you do not have them, ask someone to add them for you. What else can I do to help prevent falls?  Wear shoes that:  Do not have high heels.  Have rubber bottoms.  Are comfortable and fit you well.  Are closed at the toe. Do not wear sandals.  If you use a stepladder:  Make sure that it is fully opened. Do not climb a closed  stepladder.  Make sure that both sides of the stepladder are locked into place.  Ask someone to hold it for you, if possible.  Clearly mark and make sure that you can see:  Any grab bars or handrails.  First and last steps.  Where the edge of each step is.  Use tools that help you move around (mobility aids) if they are needed. These include:  Canes.  Walkers.  Scooters.  Crutches.  Turn on the lights when you go into a dark area. Replace any light bulbs as soon as they burn out.  Set up your furniture so you have a clear path. Avoid moving your furniture around.  If any of your floors are uneven, fix them.  If there are any pets around you, be aware of where they are.  Review your medicines with your doctor. Some medicines can make you feel dizzy. This can increase your chance of falling. Ask your doctor what other things that you can do to help prevent falls. This information is not intended to replace advice given to you by your health care provider. Make sure you discuss any questions you have with your health care provider. Document Released: 02/21/2009 Document Revised: 10/03/2015 Document Reviewed: 06/01/2014 Elsevier Interactive Patient Education  2017 [employer].

## 2020-03-06 NOTE — Telephone Encounter (Signed)
I really don't have any other thoughts at this point. She has had extensive evaluation. She does have a follow up soon with rheumatology, but has had a very difficult time tolerating medications in past and there has not been something tangible to treat, even with recent follow up with vascular.

## 2020-03-06 NOTE — Progress Notes (Signed)
Subjective:   Heidi Moore is a 86 y.o. female who presents for Medicare Annual (Subsequent) preventive examination.  I connected with Rayla Pember today by telephone and verified that I am speaking with the correct person using two identifiers. Location patient: home Location provider: work Persons participating in the virtual visit: patient, provider.   I discussed the limitations, risks, security and privacy concerns of performing an evaluation and management service by telephone and the availability of in person appointments. I also discussed with the patient that there may be a patient responsible charge related to this service. The patient expressed understanding and verbally consented to this telephonic visit.    Interactive audio and video telecommunications were attempted between this provider and patient, however failed, due to patient having technical difficulties OR patient did not have access to video capability.  We continued and completed visit with audio only.     Review of Systems    N/A  Cardiac Risk Factors include: advanced age (>31men, >63 women)     Objective:    Today's Vitals   There is no height or weight on file to calculate BMI.  Advanced Directives 03/06/2020 04/05/2018 03/25/2017 11/06/2014 10/30/2013 09/12/2013 09/05/2013  Does Patient Have a Medical Advance Directive? Yes Yes Yes Yes Patient has advance directive, copy not in chart Patient has advance directive, copy not in chart Patient has advance directive, copy not in chart  Type of Advance Directive [place];Living will - - [place];Living will Healthcare Power of Attorney Living will Living will  Does patient want to make changes to medical advance directive? No - Patient declined - - No - Patient declined - - -  Copy of [place] in Chart? Yes - validated most recent copy scanned in chart (See row information) - - Yes Copy requested from  family Copy requested from family Copy requested from family  Pre-existing out of facility DNR order (yellow form or pink MOST form) - - - - - No No    Current Medications (verified) Outpatient Encounter Medications as of 03/06/2020  Medication Sig  . acetaminophen (TYLENOL) 325 MG tablet Take 650 mg by mouth every 6 (six) hours as needed for mild pain or headache.  . B Complex-C (B-COMPLEX WITH VITAMIN C) tablet Take 1 tablet by mouth daily.  . cholecalciferol (VITAMIN D) 1000 UNITS tablet Take 1,000 Units by mouth 2 (two) times daily.   [name] estradiol (ESTRACE) 0.5 MG tablet Take 0.5 mg by mouth daily.   [name] OVER THE COUNTER MEDICATION Take 1 capsule by mouth 2 (two) times daily. [place]7 daily.  [name] OVER THE COUNTER MEDICATION 2 (two) times daily. Preservision vitamin for eyes  . Polyethyl Glycol-Propyl Glycol (SYSTANE OP) Apply to eye daily.  . vitamin E 400 UNIT capsule Take 1 capsule by mouth daily.   No facility-administered encounter medications on file as of 03/06/2020.    Allergies (verified) Alendronate sodium, Anaplex hd, Aspirin, Azithromycin, Calcium, Ciprofloxacin, Codeine, Dorzolamide hcl-timolol mal, Doxycycline, Hydrocodone, Ibandronate sodium, Ibuprofen, Penicillins, Raloxifene, Risedronate sodium, Sulfonamide derivatives, and Teriparatide   History: Past Medical History:  Diagnosis Date  . AC (acromioclavicular) joint bone spurs    on right shoulder   . Allergic rhinitis   . Arthritis   . Bursitis    right shoulder  . Complication of anesthesia    headaches  . Diverticulosis   . DIVERTICULOSIS, COLON 10/15/2006   Qualifier: Diagnosis of  By: Linna Darner MD,  Gwyndolyn Saxon Eye problem    treated by Dr Kathlen Mody per patient  . Headache(784.0)    sinus HA  . History of blood transfusion 72yrs ago   no abnormal reaction noted  . History of cystitis   . History of skin cancer    Basal cell, Dr. Lindwood Coke  . Hyperlipidemia    [place] Study LDL goal =<160    . Hypotension   . Joint pain   . Joint swelling   . Macular degeneration, dry   . Osteopenia    Intolerance to Calcium,Actonel,Fosamax,Evista, Forteo: S/P Bon[place] 2007 to 03/2010  . OSTEOPENIA 03/12/2008   Qualifier: Diagnosis of  By: Velora Heckler    . Osteoporosis   . PONV (postoperative nausea and vomiting)   . Rosacea 04/22/2009   Qualifier: Diagnosis of  By: Linna Darner MD, Gwyndolyn Saxon    . Skin cancer 06/22/2017   per patient after excision right calf by Dr Ubaldo Glassing  . SKIN CANCER, HX OF 03/12/2008   Qualifier: Diagnosis of  By: Velora Heckler    . Urinary frequency   . Vaginal atrophy    sees Dr. Gertie Fey   Past Surgical History:  Procedure Laterality Date  . APPENDECTOMY    . BUNIONECTOMY Bilateral   . CATARACT EXTRACTION, BILATERAL    . COLONOSCOPY     with Tics (no further f/u as per GI due to age)   . KNEE ARTHROPLASTY  05/18/2012   Procedure: COMPUTER ASSISTED TOTAL KNEE ARTHROPLASTY;  Surgeon: Marybelle Killings, MD;  Location: [facility];  Service: Orthopedics;  Laterality: Left;  Left Total Knee Arthroplasty, Cemented, Computer Assisted  . lt knee      meniscus tear  . MOHS SURGERY     right nostril for basel cell cancer   . NOSE SURGERY     due to trama at age 86   . TOTAL ABDOMINAL HYSTERECTOMY W/ BILATERAL SALPINGOOPHORECTOMY     for Endometriosis   . TOTAL SHOULDER ARTHROPLASTY Right 09/11/2013   Procedure: TOTAL SHOULDER ARTHROPLASTY;  Surgeon: Marybelle Killings, MD;  Location: [facility];  Service: Orthopedics;  Laterality: Right;  Right Total Shoulder Arthroplasty   Family History  Problem Relation Age of Onset  . Colon cancer Other        1st Cousin   . Stroke Mother 70  . Diabetes Brother   . Atrial fibrillation Brother        coumadin  . Lung cancer Sister    Social History   Socioeconomic History  . Marital status: Widowed    Spouse name: Not on file  . Number of children: 3  . Years of education: Not on file  . Highest education level: Some college, no degree    Occupational History    Employer: RETIRED  Tobacco Use  . Smoking status: Never Smoker  . Smokeless tobacco: Never Used  Vaping Use  . Vaping Use: Never used  Substance and Sexual Activity  . Alcohol use: No  . Drug use: No  . Sexual activity: Not on file  Other Topics Concern  . 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 She lives alone. She goes to the gym and participates in aerobics 3 x week. She drinks 3 cups of coffee a day.         Social Determinants of Health   Financial Resource Strain: Low Risk   . Difficulty of Paying Living Expenses: Not hard at all  Food Insecurity: No Food Insecurity  . Worried About Charity fundraiser in the Last Year: Never true  . Ran Out of Food in the Last Year: Never true  Transportation Needs: No Transportation Needs  . Lack of Transportation (Medical): No  . Lack of Transportation (Non-Medical): No  Physical Activity: Insufficiently Active  . Days of Exercise per Week: 7 days  . Minutes of Exercise per Session: 20 min  Stress: No Stress Concern Present  . Feeling of Stress : Not at all  Social Connections: Socially Isolated  . Frequency of Communication with Friends and Family: More than three times a week  . Frequency of Social Gatherings with Friends and Family: More than three times a week  . Attends Religious Services: Never  . Active Member of Clubs or Organizations: No  . Attends Archivist Meetings: Never  . Marital Status: Widowed    Tobacco  Counseling Counseling given: Not Answered   Clinical Intake:  Pre-visit preparation completed: Yes  Pain : No/denies pain     Nutritional Risks: None Diabetes: No  How often do you need to have someone help you when you read instructions, pamphlets, or other written materials from your doctor or pharmacy?: 1 - Never What is the last grade level you completed in school?: 2 years of college  Diabetic?No   Interpreter Needed?: No  Information entered by :: [place] of Daily Living In your present state of health, do you have any difficulty performing the following activities: 03/06/2020  Hearing? Y  Vision? Y  Difficulty concentrating or making decisions? Y  Walking or climbing stairs? N  Dressing or bathing? N  Doing errands, shopping? Y  Preparing Food and eating ? N  Using the Toilet? N  In the past six months, have you accidently leaked urine? N  Do you have problems with loss of bowel control? N  Managing your Medications? N  Managing your Finances? N  Housekeeping or managing your Housekeeping? N  Some recent data might be hidden    Patient Care Team: Caren Macadam, MD as PCP - General (Family Medicine) Rolm Bookbinder, MD as Consulting Physician (Dermatology)  Indicate any recent Medical Services you may have received from other than [facility] providers in the past year (date may be approximate).     Assessment:   This is a routine wellness examination for Heidi Moore.  Hearing/Vision screen  Hearing Screening   125Hz  250Hz  500Hz  1000Hz  2000Hz  3000Hz  4000Hz  6000Hz  8000Hz   Right ear:           Left ear:           Vision Screening Comments: Patient gets eyes checked annually. Has macular degeneration and is scheduled for laser surgery scheduled for November   Dietary issues and exercise activities discussed: Current Exercise Habits: Home exercise routine, Type of exercise: walking, Time (Minutes): 15, Frequency (Times/Week): 7, Weekly Exercise  (Minutes/Week): 105, Intensity: Mild, Exercise limited by: orthopedic condition(s)  Goals    .  Exercise 150 min/wk Moderate Activity     Exercising per Dr. Lorin [name] recommendation to strengthen her quads Continue with yard work      Depression Screen PHQ 2/9 Scores 03/06/2020 04/11/2019 04/05/2018 10/05/2017 03/25/2017 11/07/2015 10/30/2013  PHQ - 2 Score 0 0 0 0 0 0 0  PHQ- 9 Score 0 - - - - - -    Fall Risk Fall Risk  03/06/2020 08/28/2019 04/11/2019 04/11/2019 04/05/2018  Falls in the past year? 0 1 0 0 1  Comment - - - - -  Number falls in past yr: 0 1 0 0 0  Comment - - - - -  Injury with Fall? 0 0 0 0 1  Comment - pt has been sore since then - - -  Risk for fall due to : Impaired balance/gait - History of fall(s) History of fall(s) Impaired balance/gait  Follow up Falls evaluation completed;Falls prevention discussed - Falls evaluation completed;Education provided;Falls prevention discussed;Follow up appointment Education provided;Falls prevention discussed Education provided  Comment - - - had over 1 year ago -    Any stairs in or around the home? No  If so, are there any without handrails? No  Home free of loose throw rugs in walkways, pet beds, electrical cords, etc? Yes  Adequate lighting in your home to reduce risk of falls? Yes   ASSISTIVE DEVICES UTILIZED TO PREVENT FALLS:  Life alert? Yes  Use of a cane, walker or w/c? Yes  Grab bars in the bathroom? Yes  Shower chair or bench in shower? Yes  Elevated toilet seat or a handicapped toilet? Yes     Cognitive Function: Cognition deficits noticed offered Cognitive screening and patient declined. MMSE - Mini Mental State Exam 04/05/2018 03/25/2017  Not completed: (No Data) (No Data)        Immunizations Immunization History  Administered Date(s) Administered  . Influenza Split 02/13/2013  . Influenza Whole 01/31/2008  . Influenza, High Dose Seasonal PF 01/31/2016, 02/07/2017, 02/16/2018, 01/21/2019  .  Influenza-Unspecified 01/09/2014, 01/24/2017, 02/09/2018  . PFIZER SARS-COV-2 Vaccination 06/15/2019, 08/07/2019  . Pneumococcal Polysaccharide-23 05/11/1996  . Td 05/11/1994  . Tdap 10/30/2013  . Zoster 11/04/2010    TDAP status: Up to date Flu Vaccine status: Declined, Education has been provided regarding the importance of this vaccine but patient still declined. Advised may receive this vaccine at local pharmacy or Health Dept. Aware to provide a copy of the vaccination record if obtained from local pharmacy or Health Dept. Verbalized acceptance and understanding. Pneumococcal vaccine status: Declined,  Education has been provided regarding the importance of this vaccine but patient still declined. Advised may receive this vaccine at local pharmacy or Health Dept. Aware to provide a copy of the vaccination record if obtained from local pharmacy or Health Dept. Verbalized acceptance and understanding.  Covid-19 vaccine status: Completed vaccines  Qualifies for Shingles Vaccine? Yes   Zostavax completed Yes   Shingrix Completed?: No.    Education has been provided regarding the importance of this vaccine. Patient has been advised to call insurance company to determine out of pocket expense if they have not yet received this vaccine. Advised may also receive vaccine at local pharmacy or Health Dept. Verbalized acceptance and understanding.  Screening Tests Health Maintenance  Topic Date Due  . INFLUENZA VACCINE  12/10/2019  . TETANUS/TDAP  10/31/2023  . DEXA SCAN  Completed  . COVID-19 Vaccine  Completed  . PNA vac Low Risk Adult  Addressed    Health Maintenance  Health Maintenance Due  Topic Date Due  . INFLUENZA VACCINE  12/10/2019    Colorectal cancer screening: No longer required.  Mammogram status: No longer required.  Bone Density Status: No longer required  Lung Cancer Screening: (Low Dose CT Chest recommended if Age 23-80 years, 30 pack-year currently smoking OR have  quit w/in 15years.) does not qualify.   Lung Cancer Screening Referral: N/A   Additional Screening:  Hepatitis C Screening: does not qualify;  Vision Screening: Recommended annual ophthalmology exams for early detection of glaucoma and other disorders of the eye. Is the patient up to date with their annual eye exam?  Yes  Who is the provider or what is the name of the office in which the patient attends annual eye exams? See's an eye specialist at [facility] in [place] unsure of name If pt is not established with a provider, would they like to be referred to a provider to establish care? No .   Dental Screening: Recommended annual dental exams for proper oral hygiene  Community Resource Referral / Chronic Care Management: CRR required this visit?  No   CCM required this visit?  No      Plan:     I have personally reviewed and noted the following in the patient's chart:   . Medical and social history . Use of alcohol, tobacco or illicit drugs  . Current medications and supplements . Functional ability and status . Nutritional status . Physical activity . Advanced directives . List of other physicians . Hospitalizations, surgeries, and ER visits in previous 12 months . Vitals . Screenings to include cognitive, depression, and falls . Referrals and appointments  In addition, I have reviewed and discussed with patient certain preventive protocols, quality metrics, and best practice recommendations. A written personalized care plan for preventive services as well as general preventive health recommendations were provided to patient.     Ofilia Neas, LPN   [id number]/25/[id number]   Nurse Notes: Patient still has c/o of burning/ feverish feeling in her lower legs.

## 2020-03-06 NOTE — Telephone Encounter (Signed)
During wellness visit today. Patient had concerns of burning/feverish sensation to bilateral lower extremities she states that she has seen all different specialist and has not gotten any answers. I see she has been worked up for the past however she wanted me to inquire if there was anything else you can recommend. Please advise?

## 2020-03-07 NOTE — Telephone Encounter (Signed)
Left message for patient to return call.

## 2020-03-13 ENCOUNTER — Ambulatory Visit: Payer: Medicare PPO | Admitting: Rheumatology

## 2020-03-18 NOTE — Progress Notes (Signed)
Office Visit Note  Patient: Heidi Moore             Date of Birth: 1919/12/02           MRN: [id number]             PCP: Caren Macadam, MD Referring: Caren Macadam, MD Visit Date: 03/27/2020 Occupation: @GUAROCC @  Subjective:  Hot flashes in both legs.   History of Present Illness: Heidi Moore is a 86 y.o. female seen in consultation per request of her PCP.  She was accompanied by her daughter today.  And the history was obtained from both patient and her daughter.  According to the patient and September 2020 she had some allergy testing and allergy shots.  After that she developed blurred vision and dizziness.  She also started having flushing and itching all over her body mostly in her bilateral legs.  She states the itching eventually stopped but the flushing persists.  She was taking different antihistamines including liquid Allegra and eventually switched to Zyrtec tablets.  She continues to have flushing in her lower extremities.  She was evaluated by Dr. Lorin [name] who did x-ray of her hip joint, lumbar spine and knee joints which were basically unremarkable except for some degenerative changes.  She was also evaluated by Dr. 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  He did MRI of her brain which was unremarkable except for some age-related changes.  Patient states that she does have arthritis in multiple joints but the pain is manageable.  She denies any joint swelling.  She has some swelling in her left knee joint which has been replaced.  There is no history of oral ulcers, nasal ulcers, malar rash, photosensitivity or Raynaud's phenomenon.  Activities of Daily Living:  Patient reports morning stiffness for a few  minutes.   Patient Reports nocturnal pain.  Difficulty dressing/grooming: Denies Difficulty climbing stairs: Denies Difficulty getting out of chair: Denies Difficulty using hands for taps, buttons, cutlery,  and/or writing: Denies  Review of Systems  Constitutional: Positive for fatigue.  HENT: Positive for mouth dryness. Negative for mouth sores and nose dryness.   Eyes: Negative for pain and itching.  Respiratory: Negative for shortness of breath and difficulty breathing.   Cardiovascular: Negative for chest pain and palpitations.  Gastrointestinal: Negative for blood in stool, constipation and diarrhea.  Endocrine: Negative for increased urination.  Genitourinary: Negative for difficulty urinating.  Musculoskeletal: Positive for arthralgias, joint pain, myalgias, morning stiffness, muscle tenderness and myalgias.  Skin: Negative for color change, rash and redness.  Allergic/Immunologic: Negative for susceptible to infections.  Neurological: Positive for dizziness and parasthesias.  Hematological: Positive for bruising/bleeding tendency.  Psychiatric/Behavioral: Positive for sleep disturbance. Negative for depressed mood.    PMFS History:  Patient Active Problem List   Diagnosis Date Noted  . Edema 01/26/2020  . Lower extremity edema 01/26/2020  . Rosacea 02/19/2014  . Osteoarthritis of right shoulder 09/11/2013    Class: Diagnosis of  . Macular degeneration - followed by optho, Dr. Brandy Hale 08/24/2012  . Hot flashes - followed by gyn, Dr. Gertie Fey 08/24/2012  . Osteoarthritis of left knee 05/17/2012    Class: Diagnosis of  . LOW BACK PAIN SYNDROME 05/07/2010  . VITAMIN D DEFICIENCY 04/22/2009  . Allergic rhinitis 03/12/2008  . Hyperlipemia 10/22/2006    Past Medical History:  Diagnosis Date  . AC (acromioclavicular) joint bone spurs    on right shoulder   .  Allergic rhinitis   . Arthritis   . Bursitis    right shoulder  . Complication of anesthesia    headaches  . Diverticulosis   . DIVERTICULOSIS, COLON 10/15/2006   Qualifier: Diagnosis of  By: Alwyn Ren MD, Chrissie Noa Eye problem    treated by Dr Alben Spittle per patient  . Headache(784.0)    sinus HA  . History of blood  transfusion 28yrs ago   no abnormal reaction noted  . History of cystitis   . History of skin cancer    Basal cell, Dr. Leta Speller  . Hyperlipidemia    [place] Study LDL goal =<160   . Hypotension   . Joint pain   . Joint swelling   . Macular degeneration, dry   . Osteopenia    Intolerance to Calcium,Actonel,Fosamax,Evista, Forteo: S/P Boniva 2007 to 03/2010  . OSTEOPENIA 03/12/2008   Qualifier: Diagnosis of  By: Freddy Jaksch    . Osteoporosis   . PONV (postoperative nausea and vomiting)   . Rosacea 04/22/2009   Qualifier: Diagnosis of  By: Alwyn Ren MD, Chrissie Noa    . Skin cancer 06/22/2017   per patient after excision right calf by Dr Nicholas Lose  . SKIN CANCER, HX OF 03/12/2008   Qualifier: Diagnosis of  By: Freddy Jaksch    . Urinary frequency   . Vaginal atrophy    sees Dr. Huntley Dec    Family History  Problem Relation Age of Onset  . Colon cancer Other        1st Cousin   . Stroke Mother 22  . Diabetes Brother   . Atrial fibrillation Brother        coumadin  . Lung cancer Sister    Past Surgical History:  Procedure Laterality Date  . APPENDECTOMY    . BUNIONECTOMY Bilateral   . CATARACT EXTRACTION, BILATERAL    . COLONOSCOPY     with Tics (no further f/u as per GI due to age)   . JOINT REPLACEMENT Left    knee   . KNEE ARTHROPLASTY  05/18/2012   Procedure: COMPUTER ASSISTED TOTAL KNEE ARTHROPLASTY;  Surgeon: Eldred Manges, MD;  Location: [facility] OR;  Service: Orthopedics;  Laterality: Left;  Left Total Knee Arthroplasty, Cemented, Computer Assisted  . lt knee      meniscus tear  . MOHS SURGERY     right nostril for basel cell cancer   . NOSE SURGERY     due to trama at age 71   . TOTAL ABDOMINAL HYSTERECTOMY W/ BILATERAL SALPINGOOPHORECTOMY     for Endometriosis   . 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 She lives alone. She goes to the gym and participates in aerobics 3 x week.  She drinks 3 cups of coffee a day.         Immunization History  Administered Date(s) Administered  . Influenza Split 02/13/2013  . Influenza Whole 01/31/2008  . Influenza, High Dose Seasonal PF 01/31/2016, 02/07/2017, 02/16/2018, 01/21/2019  . Influenza-Unspecified 01/09/2014, 01/24/2017, 02/09/2018  . PFIZER SARS-COV-2 Vaccination 06/15/2019, 08/07/2019  . Pneumococcal Polysaccharide-23 05/11/1996  . Td 05/11/1994  . Tdap 10/30/2013  . Zoster 11/04/2010     Objective: Vital Signs: BP (!) 144/74 (BP Location: Right Arm, Patient Position: Sitting, Cuff Size: Normal)   Pulse 77   Resp 13   Ht 5' 0.25" (1.53 m)   Wt 111 lb 3.2 oz (50.4 kg)   BMI 21.54 kg/m    Physical Exam Vitals and nursing note reviewed.  Constitutional:      Appearance: She is well-developed.  HENT:     Head: Normocephalic and atraumatic.  Eyes:     Conjunctiva/sclera: Conjunctivae normal.  Cardiovascular:     Rate and Rhythm: Normal rate and regular rhythm.     Heart sounds: Normal heart sounds.  Pulmonary:     Effort: Pulmonary effort is normal.     Breath sounds: Normal breath sounds.  Abdominal:     General: Bowel sounds are normal.     Palpations: Abdomen is soft.  Musculoskeletal:     Cervical back: Normal range of motion.  Lymphadenopathy:     Cervical: No cervical adenopathy.  Skin:    General:  Skin is warm and dry.     Capillary Refill: Capillary refill takes less than 2 seconds.     Comments: She had bilateral varicose veins.  Neurological:     Mental Status: She is alert and oriented to person, place, and time.  Psychiatric:        Behavior: Behavior normal.      Musculoskeletal Exam: She had good range of motion of her cervical spine.  She has thoracic kyphosis.  Right shoulder joint had limited range of motion.  Left shoulder joint was in good range of motion.  Elbow joints with good range of motion.  She has bilateral CMC PIP and DIP thickening consistent with osteoarthritis.  She had good range of motion of her hip joints.  Left knee joint is replaced and had some warmth.  Right knee joint was in good range of motion.  She had no tenderness or swelling over ankle joints or MTPs.  CDAI Exam: CDAI Score: -- Patient Global: --; Provider Global: -- Swollen: --; Tender: -- Joint Exam 03/27/2020   No joint exam has been documented for this visit   There is currently no information documented on the homunculus. Go to the Rheumatology activity and complete the homunculus joint exam.  Investigation: No additional findings.  Imaging: No results found.  Recent Labs: Lab Results  Component Value Date   WBC 6.1 06/18/2019   HGB 13.9 06/18/2019   PLT 175 06/18/2019   NA 141 06/18/2019   K 3.6 06/18/2019   CL 106 06/18/2019   CO2 23 06/18/2019   GLUCOSE 91 06/18/2019   BUN 11 06/18/2019   CREATININE 0.88 06/18/2019   BILITOT 0.8 06/18/2019   ALKPHOS 44 06/18/2019   AST 20 06/18/2019   ALT 18 06/18/2019   PROT 6.6 06/18/2019   ALBUMIN 3.9 06/18/2019   CALCIUM 9.3 06/18/2019   GFRAA >60 06/18/2019    Speciality Comments: No specialty comments available.  Procedures:  No procedures performed Allergies: Alendronate sodium, Anaplex hd, Aspirin, Azithromycin, Calcium, Ciprofloxacin,  Codeine, Dorzolamide hcl-timolol mal, Doxycycline, Hydrocodone, Ibandronate sodium,  Ibuprofen, Penicillins, Raloxifene, Risedronate sodium, Sulfonamide derivatives, Teriparatide, and Travoprost   Assessment / Plan:     Visit Diagnoses: Pain of lower extremity, unspecified laterality - 06/01/19: ANA negative, CK 88, CRP<1, ESR 12.  Patient complains of " hot flashes in her bilateral lower extremities".  She had extensive work-up by orthopedics and neurology.  I reviewed all the records.  The findings were discussed with the patient.  Autoimmune work-up was negative.  She has no clinical features of autoimmune disease.  I would not advise any further work-up at this time.  She has no history of oral ulcers, nasal ulcers, malar rash, photosensitivity, Raynaud's phenomenon or rash.  She also had extensive neurological work-up and was diagnosed with mild neuropathy.  I also noted that her B12 level was normal.  Hot flashes - followed by gyn, Dr. Gertie Fey  Primary osteoarthritis of right shoulder-she is followed by Dr. Lorin [name].  She has limited range of motion of her right shoulder.  Osteoarthritis bilateral hands-she has bilateral CMC PIP and DIP thickening with no synovitis.  These findings are consistent with osteoarthritis.  She states the pain is manageable.  Status post left total knee replacement-she has some warmth in her left knee joint.  She does not have much discomfort.  Rosacea  Vitamin D deficiency  History of hyperlipidemia  Macular degeneration, unspecified laterality, unspecified type  Orders: No orders of the defined types were placed in this encounter.  No orders of the defined types were placed in this encounter.    Follow-Up Instructions: Return if symptoms worsen or fail to improve, for hot flashes, Osteoarthritis.   Bo Merino, MD  Note - This record has been created using Editor, commissioning.  Chart creation errors have been sought, but may not always  have been located. Such creation errors do not reflect on  the standard of medical care.

## 2020-03-26 ENCOUNTER — Telehealth: Payer: Self-pay | Admitting: Family Medicine

## 2020-03-26 DIAGNOSIS — M810 Age-related osteoporosis without current pathological fracture: Secondary | ICD-10-CM

## 2020-03-26 NOTE — Telephone Encounter (Signed)
Pt call and stated the medicare nurse recommend that she talk with dr.Koberlein about a bone density and she want a call back.

## 2020-03-27 ENCOUNTER — Encounter: Payer: Self-pay | Admitting: Rheumatology

## 2020-03-27 ENCOUNTER — Other Ambulatory Visit: Payer: Self-pay

## 2020-03-27 ENCOUNTER — Ambulatory Visit: Payer: Medicare PPO | Admitting: Rheumatology

## 2020-03-27 VITALS — BP 144/74 | HR 77 | Resp 13 | Ht 60.25 in | Wt 111.2 lb

## 2020-03-27 DIAGNOSIS — M19011 Primary osteoarthritis, right shoulder: Secondary | ICD-10-CM | POA: Diagnosis not present

## 2020-03-27 DIAGNOSIS — E559 Vitamin D deficiency, unspecified: Secondary | ICD-10-CM

## 2020-03-27 DIAGNOSIS — M79606 Pain in leg, unspecified: Secondary | ICD-10-CM

## 2020-03-27 DIAGNOSIS — Z96652 Presence of left artificial knee joint: Secondary | ICD-10-CM

## 2020-03-27 DIAGNOSIS — Z8639 Personal history of other endocrine, nutritional and metabolic disease: Secondary | ICD-10-CM

## 2020-03-27 DIAGNOSIS — H3530 Unspecified macular degeneration: Secondary | ICD-10-CM

## 2020-03-27 DIAGNOSIS — M19041 Primary osteoarthritis, right hand: Secondary | ICD-10-CM

## 2020-03-27 DIAGNOSIS — R232 Flushing: Secondary | ICD-10-CM

## 2020-03-27 DIAGNOSIS — M1712 Unilateral primary osteoarthritis, left knee: Secondary | ICD-10-CM

## 2020-03-27 DIAGNOSIS — M19042 Primary osteoarthritis, left hand: Secondary | ICD-10-CM

## 2020-03-27 DIAGNOSIS — L719 Rosacea, unspecified: Secondary | ICD-10-CM

## 2020-03-27 NOTE — Patient Instructions (Signed)

## 2020-03-27 NOTE — Telephone Encounter (Signed)
She can absolutely get a repeat bone density if she would like. She had completed course of boniva historically and had significant improvement in bone density on last imaging (which was 2012). If she would be willing to consider taking medication/treatment for low bone density in case this is result of scan, then it is worthwhile to repeat in order to prevent fracture. If she would like to proceed; ok to order dexa.

## 2020-03-29 NOTE — Telephone Encounter (Signed)
Patient informed of the message below and transferred to [facility] to schedule the appt.

## 2020-04-01 ENCOUNTER — Ambulatory Visit (INDEPENDENT_AMBULATORY_CARE_PROVIDER_SITE_OTHER)
Admission: RE | Admit: 2020-04-01 | Discharge: 2020-04-01 | Disposition: A | Discharge status: Home / Self Care | Payer: Medicare PPO | Source: Ambulatory Visit | Attending: Family Medicine | Admitting: Family Medicine

## 2020-04-01 DIAGNOSIS — M810 Age-related osteoporosis without current pathological fracture: Secondary | ICD-10-CM | POA: Diagnosis not present

## 2020-04-06 IMAGING — CR DG CHEST 2V
2 series · 2 of 2 positions shown · non-contrast
Comparison: 09/05/2013

CLINICAL DATA: [AGE] female with cough

EXAM:
CHEST - 2 VIEW

[w chest pa]
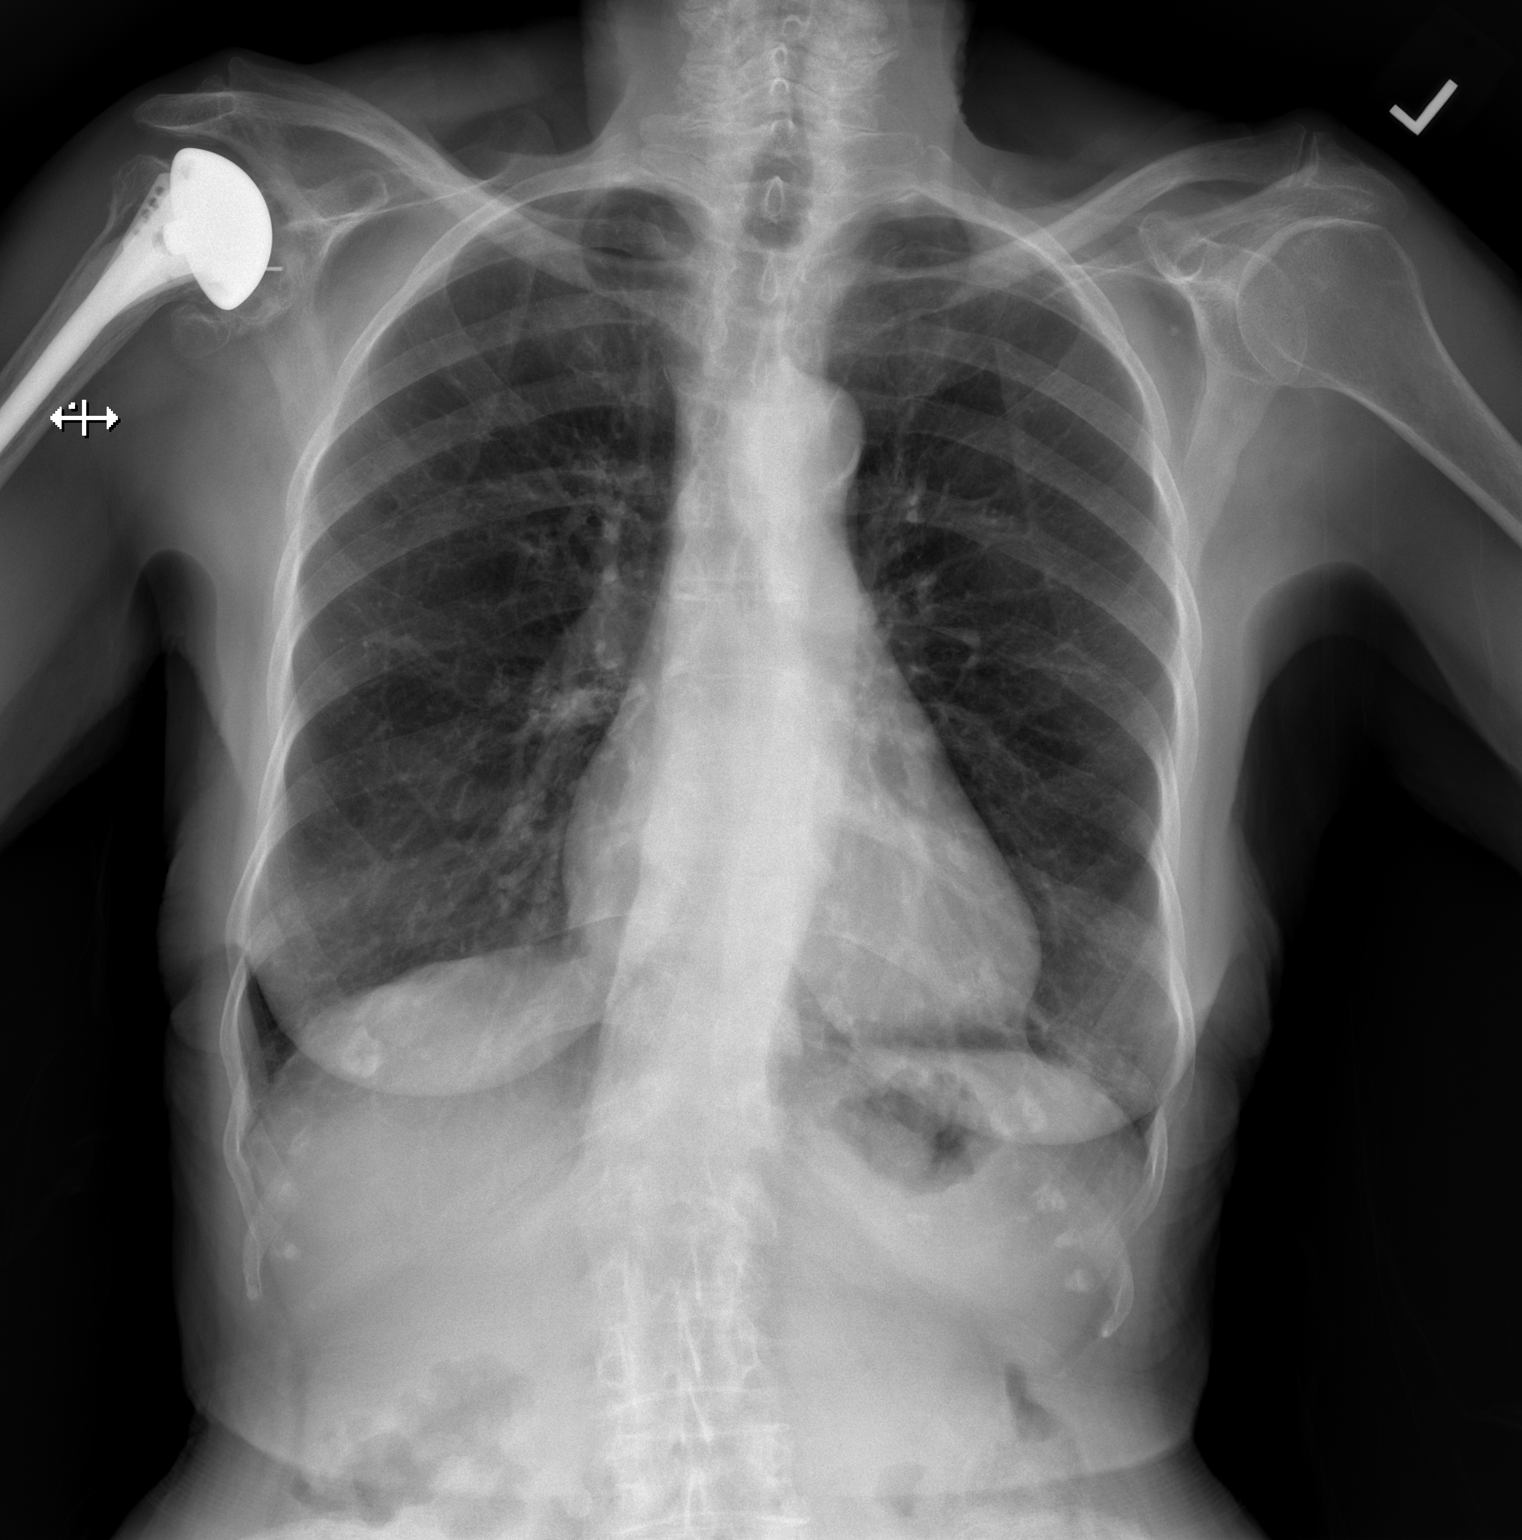

[w chest lat]
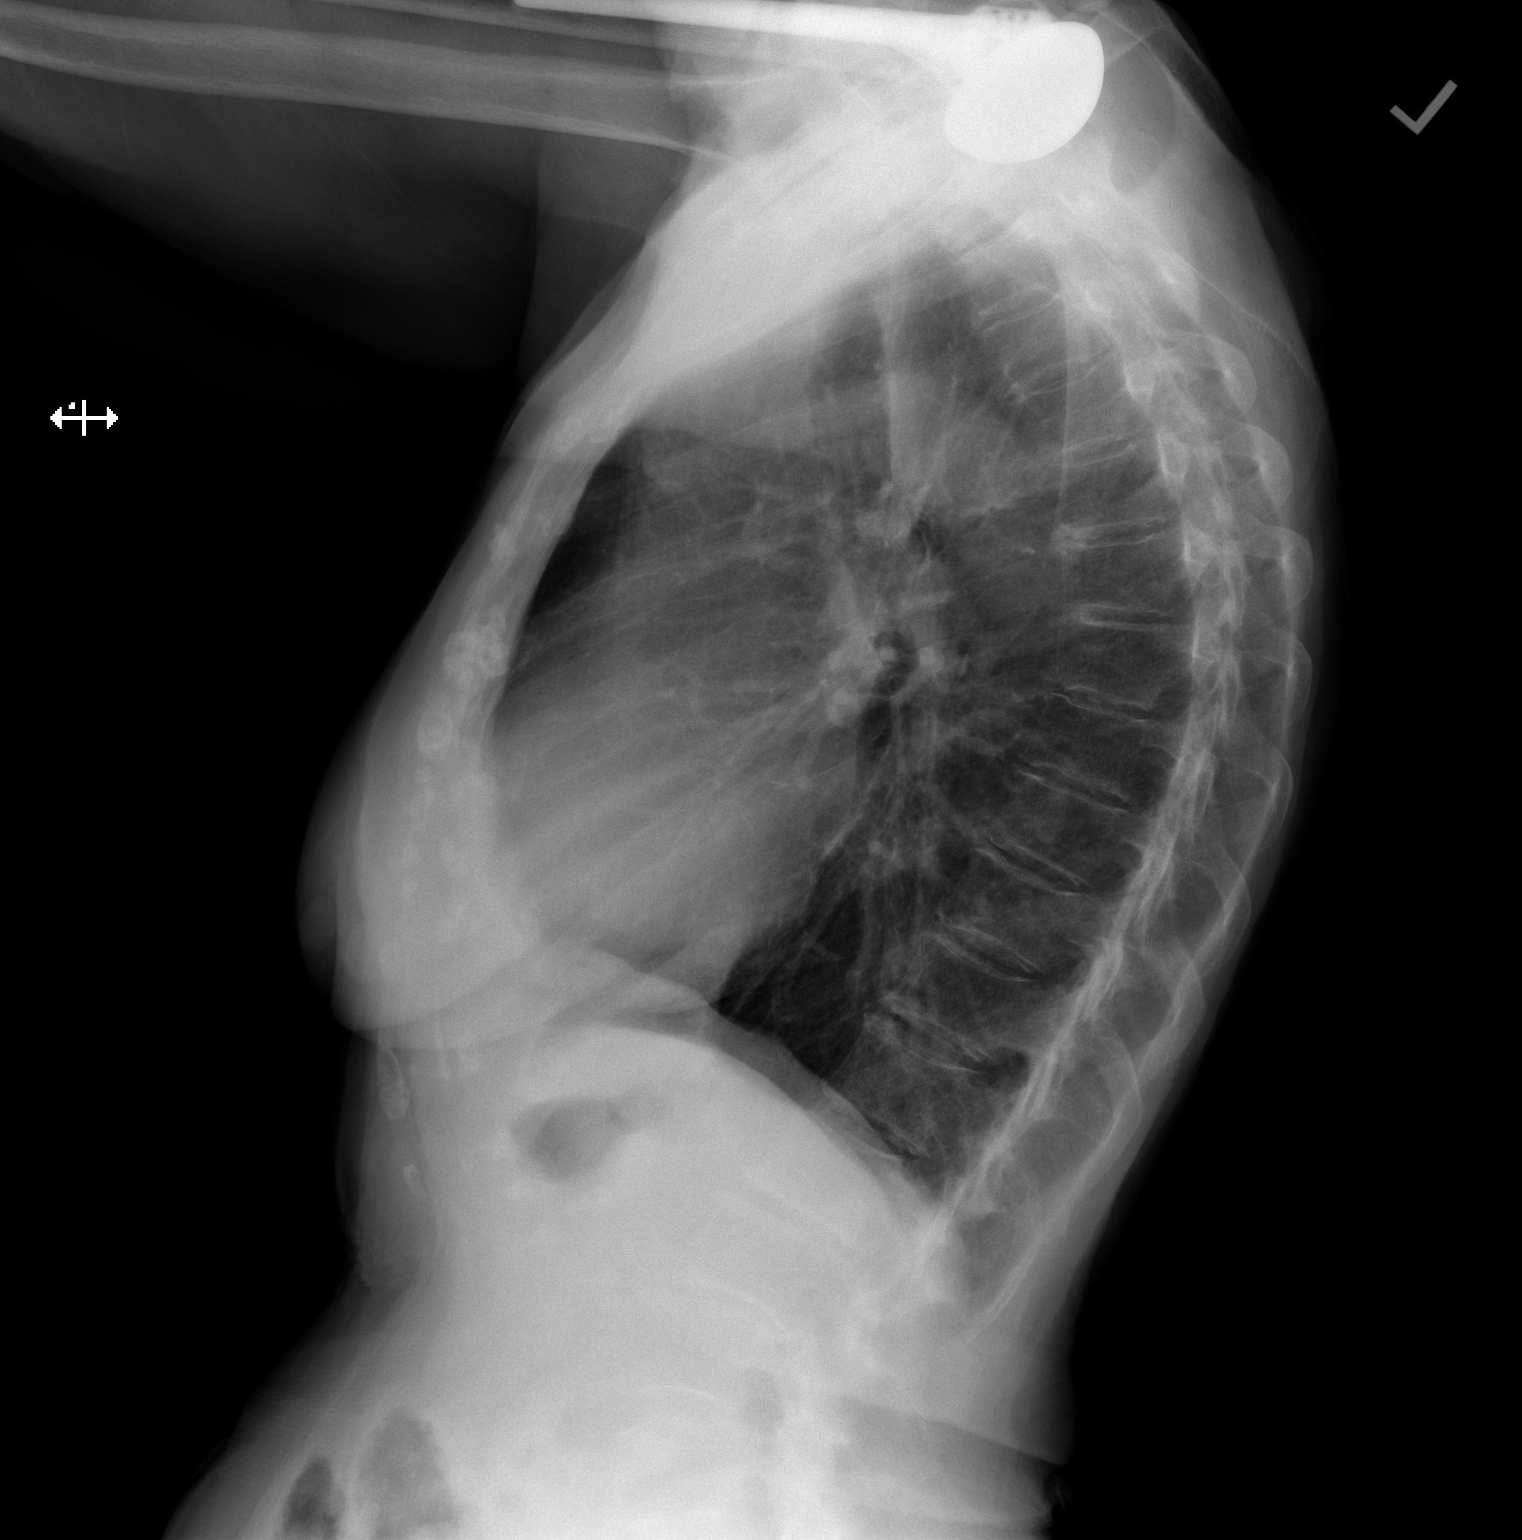

[2 of 2 positions shown; findings below may reference images not displayed]

FINDINGS: Cardiomediastinal silhouette within normal limits. No evidence of
central vascular congestion. No interlobular septal thickening. No
pneumothorax. No pleural effusion. No confluent airspace disease.
Mild coarsening of the interstitial markings similar to the prior.
Chondral calcifications anterior chest.

Surgical changes of the right glenohumeral joint.

Degenerative changes of the spine.  No displaced fracture.
IMPRESSION: Negative for acute cardiopulmonary disease.

## 2020-04-11 ENCOUNTER — Telehealth: Payer: Self-pay | Admitting: Family Medicine

## 2020-04-11 NOTE — Telephone Encounter (Signed)
Pt returned the call to the office and while on the line pt stated that she would like to have her ears cleaned out and to speak to the doctor about her having cramping in her legs and hands.  Pt is aware to let the provider know at her visit on 04/12/2020 at 4:00.

## 2020-04-12 ENCOUNTER — Ambulatory Visit: Payer: Medicare PPO | Admitting: Family Medicine

## 2020-04-12 ENCOUNTER — Other Ambulatory Visit: Payer: Self-pay

## 2020-04-12 ENCOUNTER — Encounter: Payer: Self-pay | Admitting: Family Medicine

## 2020-04-12 VITALS — BP 130/68 | HR 88 | Temp 98.0°F | Ht 60.25 in | Wt 109.9 lb

## 2020-04-12 DIAGNOSIS — R252 Cramp and spasm: Secondary | ICD-10-CM

## 2020-04-12 DIAGNOSIS — H6123 Impacted cerumen, bilateral: Secondary | ICD-10-CM

## 2020-04-12 DIAGNOSIS — R600 Localized edema: Secondary | ICD-10-CM | POA: Diagnosis not present

## 2020-04-12 DIAGNOSIS — G629 Polyneuropathy, unspecified: Secondary | ICD-10-CM | POA: Diagnosis not present

## 2020-04-12 DIAGNOSIS — R42 Dizziness and giddiness: Secondary | ICD-10-CM | POA: Diagnosis not present

## 2020-04-12 DIAGNOSIS — H9193 Unspecified hearing loss, bilateral: Secondary | ICD-10-CM | POA: Diagnosis not present

## 2020-04-12 DIAGNOSIS — R7989 Other specified abnormal findings of blood chemistry: Secondary | ICD-10-CM

## 2020-04-12 NOTE — Progress Notes (Signed)
Heidi Moore DOB: [birth year] Encounter date: 04/12/2020  This is a 86 y.o. female who presents with No chief complaint on file.   History of present illness: Having trouble with eyes and ears.   Couldn't even get checked for hearing aids because of wax in ears. Thinks that wax in ears is contributing to dizziness. Has been going on for a few years. Thinks eustachian tube dysfunction and would like to have referral back to ENT as she already has appointment scheduled on 12/29. Hard to hear TV/movies. Has visit at [facility] for hearing check.   Had increased eye pressure - but didn't tolerate the three drops that were tried. Was sent to [facility] due to this but when they checked pressures they were improved. Had complete exam at that time and everything looked good. They will see her in 3 months for re check.   Had booster yesterday and before made her sore for several weeks; but this one hasn't been as bad.   *would like copy of bone density.   Left knee is bothering her where she had replacement is staying sore. Fingers are sore as well and left hip stays sore. Hasn't been able to go to chiropractor for over a year.   She still has issues with neuropathy in her legs.  She does not state that this is worsening.  Allergies  Allergen Reactions  . Alendronate Sodium Nausea And Vomiting  . Anaplex Hd Nausea And Vomiting  . Aspirin Nausea And Vomiting  . Azithromycin Nausea And Vomiting  . Calcium Nausea And Vomiting  . Ciprofloxacin     Patient states this caused nausea, dizziness and affected her muscles  . Codeine Nausea And Vomiting  . Dorzolamide Hcl-Timolol Mal Other (See Comments)  . Doxycycline Nausea And Vomiting  . Hydrocodone Nausea And Vomiting  . Ibandronate Sodium     REACTION: severe body aches  . Ibuprofen Nausea And Vomiting  . Penicillins Nausea And Vomiting  . Raloxifene Nausea And Vomiting  . Risedronate Sodium Nausea And Vomiting  . Sulfonamide Derivatives  Nausea And Vomiting  . Teriparatide Nausea And Vomiting  . Travoprost    Current Meds  Medication Sig  . acetaminophen (TYLENOL) 325 MG tablet Take 650 mg by mouth every 6 (six) hours as needed for mild pain or headache.  . Ascorbic Acid (VITAMIN C PO) Take 500 mg by mouth 2 (two) times daily.  . B Complex-C (B-COMPLEX WITH VITAMIN C) tablet Take 1 tablet by mouth daily.  . cholecalciferol (VITAMIN D) 1000 UNITS tablet Take 1,000 Units by mouth 2 (two) times daily.   [name] estradiol (ESTRACE) 0.5 MG tablet Take 0.5 mg by mouth daily.   [name] OVER THE COUNTER MEDICATION Take 1 capsule by mouth 2 (two) times daily. [place]7 daily.  [name] OVER THE COUNTER MEDICATION 2 (two) times daily. Preservision vitamin for eyes  . Polyethyl Glycol-Propyl Glycol (SYSTANE OP) Apply to eye daily.  . TURMERIC CURCUMIN PO Take by mouth daily.  . vitamin E 400 UNIT capsule Take 1 capsule by mouth daily.    Review of Systems  Constitutional: Negative for chills, fatigue and fever.  HENT: Positive for hearing loss. Negative for congestion.   Respiratory: Negative for cough, chest tightness, shortness of breath and wheezing.   Cardiovascular: Negative for chest pain, palpitations and leg swelling.  Neurological: Positive for numbness.  Psychiatric/Behavioral: The patient is nervous/anxious.     Objective:  BP 130/68 (BP Location: Left Arm, Patient Position:  Sitting, Cuff Size: Normal)   Pulse 88   Temp 98 F (36.7 C) (Oral)   Ht 5' 0.25" (1.53 m)   Wt 109 lb 14.4 oz (49.9 kg)   BMI 21.29 kg/m   Weight: 109 lb 14.4 oz (49.9 kg)   BP Readings from Last 3 Encounters:  04/12/20 130/68  03/27/20 (!) 144/74  01/26/20 124/64   Wt Readings from Last 3 Encounters:  04/12/20 109 lb 14.4 oz (49.9 kg)  03/27/20 111 lb 3.2 oz (50.4 kg)  01/26/20 109 lb 3.2 oz (49.5 kg)    Physical Exam Constitutional:      General: She is not in acute distress.    Appearance: She is well-developed.   Cardiovascular:     Rate and Rhythm: Normal rate and regular rhythm.     Heart sounds: Normal heart sounds. No murmur heard. No friction rub.  Pulmonary:     Effort: Pulmonary effort is normal. No respiratory distress.     Breath sounds: Normal breath sounds. No wheezing or rales.  Musculoskeletal:     Right lower leg: 1+ Edema present.     Left lower leg: 1+ Edema present.  Neurological:     Mental Status: She is alert and oriented to person, place, and time.  Psychiatric:        Behavior: Behavior normal.    Procedure note ear lavage: Diagnosis: Bilateral cerumen impaction After verbal consent was contained and risks of procedure discussed, patient allowed irrigation of bilateral ears.  Cerumen impaction was resolved in the right ear with irrigation and use of lighted curette.  Cerumen was in the left ear was more adhered to the tympanic membrane and although patient did tolerate irrigation, cerumen did not come out.   Assessment/Plan  1. Bilateral hearing loss, unspecified hearing loss type We were able to resolve cerumen impaction in the right ear.  I advised her to use earwax softening drops in the left ear so that cerumen could be removed when she follows up with ear nose and throat doctor. - Ambulatory referral to ENT  2. Dizziness - Ambulatory referral to ENT She has been evaluated by neurology.  She had MRI done in 09/2019 which was normal age-related findings.  3. Neuropathy She has seen multiple specialists for this including neurology, cardiology (for bilateral lower extremity edema).  She does not tolerate medications well and I would worry about tolerance to medication like gabapentin for neuropathy. bloodwork has not been revealing as to cause of neuropathy. F/u with neuro if continued concerns.  4. Bilateral edema of lower extremity Elevation of legs recommended; does not wear compression stockings.  - Urinalysis; Future - Urinalysis  5. Elevated TSH - TSH;  Future - TSH  6. Muscle cramps - CBC with Differential/Platelet; Future - Comprehensive metabolic panel; Future - Magnesium, RBC; Future - Magnesium, RBC - Comprehensive metabolic panel - CBC with Differential/Platelet    Return in about 3 months (around 07/11/2020) for physical exam.     Micheline Rough, MD

## 2020-04-12 NOTE — Telephone Encounter (Signed)
Noted  

## 2020-04-12 NOTE — Patient Instructions (Addendum)
*  can consider [facility] - [phone]  [address], [address], [place] [address]  *consider debrox ear drops to help wax softening. You can use this a couple of days a week to help with wax build up.   *I did place referral for Jolene Provost

## 2020-04-16 ENCOUNTER — Telehealth: Payer: Self-pay | Admitting: Family Medicine

## 2020-04-16 NOTE — Telephone Encounter (Signed)
Patient is calling and wanted to see if provider can put in a referral for patient to see [facility] for neuropathy, please advise. CB is [phone]

## 2020-04-16 NOTE — Telephone Encounter (Signed)
She saw neurology, Dr. Tomi Likens in may of this year. I would suggest follow up with him for neuropathy.

## 2020-04-17 LAB — COMPREHENSIVE METABOLIC PANEL
AG Ratio: 1.9 (calc) (ref 1.0–2.5)
ALT: 15 U/L (ref 6–29)
AST: 22 U/L (ref 10–35)
Albumin: 4.2 g/dL (ref 3.6–5.1)
Alkaline phosphatase (APISO): 46 U/L (ref 37–153)
BUN: 14 mg/dL (ref 7–25)
CO2: 26 mmol/L (ref 20–32)
Calcium: 9.9 mg/dL (ref 8.6–10.4)
Chloride: 107 mmol/L (ref 98–110)
Creat: 0.84 mg/dL (ref 0.60–0.88)
Globulin: 2.2 g/dL (calc) (ref 1.9–3.7)
Glucose, Bld: 112 mg/dL — ABNORMAL HIGH (ref 65–99)
Potassium: 4.1 mmol/L (ref 3.5–5.3)
Sodium: 139 mmol/L (ref 135–146)
Total Bilirubin: 0.5 mg/dL (ref 0.2–1.2)
Total Protein: 6.4 g/dL (ref 6.1–8.1)

## 2020-04-17 LAB — URINALYSIS
Bilirubin Urine: NEGATIVE
Glucose, UA: NEGATIVE
Hgb urine dipstick: NEGATIVE
Ketones, ur: NEGATIVE
Nitrite: NEGATIVE
Protein, ur: NEGATIVE
Specific Gravity, Urine: 1.01 (ref 1.001–1.03)
pH: <=5 (ref 5.0–8.0)

## 2020-04-17 LAB — CBC WITH DIFFERENTIAL/PLATELET
Absolute Monocytes: 616 cells/uL (ref 200–950)
Basophils Absolute: 49 cells/uL (ref 0–200)
Basophils Relative: 0.7 %
Eosinophils Absolute: 119 cells/uL (ref 15–500)
Eosinophils Relative: 1.7 %
HCT: 39.4 % (ref 35.0–45.0)
Hemoglobin: 13 g/dL (ref 11.7–15.5)
Lymphs Abs: 1386 cells/uL (ref 850–3900)
MCH: 29.7 pg (ref 27.0–33.0)
MCHC: 33 g/dL (ref 32.0–36.0)
MCV: 90.2 fL (ref 80.0–100.0)
MPV: 11.7 fL (ref 7.5–12.5)
Monocytes Relative: 8.8 %
Neutro Abs: 4830 cells/uL (ref 1500–7800)
Neutrophils Relative %: 69 %
Platelets: 185 10*3/uL (ref 140–400)
RBC: 4.37 10*6/uL (ref 3.80–5.10)
RDW: 13.4 % (ref 11.0–15.0)
Total Lymphocyte: 19.8 %
WBC: 7 10*3/uL (ref 3.8–10.8)

## 2020-04-17 LAB — TSH: TSH: 5.43 mIU/L — ABNORMAL HIGH (ref 0.40–4.50)

## 2020-04-17 LAB — MAGNESIUM, RBC: Magnesium RBC: 5.3 mg/dL (ref 4.0–6.4)

## 2020-04-17 NOTE — Telephone Encounter (Signed)
Patient's daughter informed of the message below  

## 2020-04-19 ENCOUNTER — Other Ambulatory Visit: Payer: Self-pay

## 2020-04-19 ENCOUNTER — Ambulatory Visit: Payer: Medicare PPO | Admitting: Neurology

## 2020-04-19 ENCOUNTER — Encounter: Payer: Self-pay | Admitting: Neurology

## 2020-04-19 VITALS — BP 146/81 | HR 77 | Ht 60.0 in | Wt 109.4 lb

## 2020-04-19 DIAGNOSIS — R232 Flushing: Secondary | ICD-10-CM | POA: Diagnosis not present

## 2020-04-19 DIAGNOSIS — G629 Polyneuropathy, unspecified: Secondary | ICD-10-CM | POA: Diagnosis not present

## 2020-04-19 NOTE — Patient Instructions (Signed)
While I do think you have neuropathy, I do not think it is the cause of these hot spells.

## 2020-04-19 NOTE — Progress Notes (Signed)
NEUROLOGY FOLLOW UP OFFICE NOTE  RUBERTA HOLCK [id number]   Subjective:  Heidi Moore is a 86 year old female whom I previously saw for dizziness presents today for neuropathy  She is accompanied by her daughter who supplements history.  UPDATE: In September 2020, she began experiencing episodes of hot flashes or "flushing", described as a sensation of feeling hot deep in her body.  At first, it only involved her feet up to her knees.  Over time, it began to involve her arms, across her back, chest and less frequently involved her face.  Sometiems she has associated chills as well.  There was no sensation on the skin.  Other than numbness in the toes, no numbness elsewhere.  No associated paresthesias or rash.  She saw immunology and had an allergy workup.  She would treat episodes with Zyrtec or a cold washcloth, which helps.  She also has bilateral lower extremity pain.  She has been evaluated by vascular, orthopedics and rheumatology.  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  She reportedly had workup for carcinoid syndrome, which was negative.  Autoimmune workup (ANA negative, sed rate 12, CRP negative, CK 88) was negative.  B12 was 511.  TSH is elevated but T3 and T4 are normal.  She has osteoarthritis in the shoulder, hands and knees.    HISTORY: She has history of "sinus problems" every year.  During allergy season, she will start feeling stuffed up in the head, face and right ear.  It is often accompanied by a dull non-throbbing holocephalic headache.  She began experiencing head/face pressure and right aural fullness beginning in late February.  At this time, she began experiencing dizziness.  It was described a sense of movement but not spinning and associated with feeling off balance.  There was no associated nausea, vomiting, double vision, hearing loss, tinnitus or unilateral numbness or weakness.  Symptoms  would occur when she is up and walking but would resolve when laying down or sitting.  It would occur for several minutes up to a half hour.  She was evaluated by ENT in May 2019.  CT maxillofacial was unremarkable.  She went for vestibular rehabilitation which has been effective.    In September 2020, she received allergy shots and almost immediately developed blurred vision, sensation of flushing and itching involving her entire body, including numbness in her legs.  She also endorsed dizziness and feeling unsteady on her feet.  The blurred vision is bilateral but is improved but not completely resolved when covering either eye.  Not double vision.  She had an eye exam performed by Dr. Kathlen Mody in January which was unrevealing.  She had CT of head on 06/18/2019, which was personally reviewed and negative for acute intracranial abnormality.  Numbness in legs have improved, as well as dizziness.  I saw her in April 2021.  Still had some mild blurred vision.  Still feels off-balance.  MRI of brain without contrast on 09/28/2019 showed mild chronic small vessel disease in the cerebral hemispheres and pons but no acute abnormality.   PAST MEDICAL HISTORY: Past Medical History:  Diagnosis Date  . AC (acromioclavicular) joint bone spurs    on right shoulder   . Allergic rhinitis   . Arthritis   . Bursitis    right shoulder  . Complication of anesthesia    headaches  . Diverticulosis   . DIVERTICULOSIS, COLON 10/15/2006   Qualifier: Diagnosis of  By: Linna Darner MD, Gwyndolyn Saxon Eye problem    treated by Dr Kathlen Mody per patient  . Headache(784.0)    sinus HA  . History of blood transfusion 50yrs ago   no abnormal reaction noted  . History of cystitis   . History of skin cancer    Basal cell, Dr. Lindwood Coke  . Hyperlipidemia    [place] Study LDL goal =<160   . Hypotension   . Joint pain   . Joint swelling   . Macular degeneration, dry   . Osteopenia    Intolerance to  Calcium,Actonel,Fosamax,Evista, Forteo: S/P Boniva 2007 to 03/2010  . OSTEOPENIA 03/12/2008   Qualifier: Diagnosis of  By: Velora Heckler    . Osteoporosis   . PONV (postoperative nausea and vomiting)   . Rosacea 04/22/2009   Qualifier: Diagnosis of  By: Linna Darner MD, Gwyndolyn Saxon    . Skin cancer 06/22/2017   per patient after excision right calf by Dr Ubaldo Glassing  . SKIN CANCER, HX OF 03/12/2008   Qualifier: Diagnosis of  By: Velora Heckler    . Urinary frequency   . Vaginal atrophy    sees Dr. Gertie Fey    MEDICATIONS: Current Outpatient Medications on File Prior to Visit  Medication Sig Dispense Refill  . acetaminophen (TYLENOL) 325 MG tablet Take 650 mg by mouth every 6 (six) hours as needed for mild pain or headache.    . Ascorbic Acid (VITAMIN C PO) Take 500 mg by mouth 2 (two) times daily.    . B Complex-C (B-COMPLEX WITH VITAMIN C) tablet Take 1 tablet by mouth daily.    . cholecalciferol (VITAMIN D) 1000 UNITS tablet Take 1,000 Units by mouth 2 (two) times daily.     [name] estradiol (ESTRACE) 0.5 MG tablet Take 0.5 mg by mouth daily.     [name] OVER THE COUNTER MEDICATION Take 1 capsule by mouth 2 (two) times daily. [place]7 daily.    [name] OVER THE COUNTER MEDICATION 2 (two) times daily. Preservision vitamin for eyes    . Polyethyl Glycol-Propyl Glycol (SYSTANE OP) Apply to eye daily.    . TURMERIC CURCUMIN PO Take by mouth daily.    . vitamin E 400 UNIT capsule Take 1 capsule by mouth daily.     No current facility-administered medications on file prior to visit.    ALLERGIES: Allergies  Allergen Reactions  . Alendronate Sodium Nausea And Vomiting  . Anaplex Hd Nausea And Vomiting  . Aspirin Nausea And Vomiting  . Azithromycin Nausea And Vomiting  . Calcium Nausea And Vomiting  . Ciprofloxacin     Patient states this caused nausea, dizziness and affected her muscles  . Codeine Nausea And Vomiting  . Dorzolamide Hcl-Timolol Mal Other (See Comments)  . Doxycycline Nausea And  Vomiting  . Hydrocodone Nausea And Vomiting  . Ibandronate Sodium     REACTION: severe body aches  . Ibuprofen Nausea And Vomiting  . Penicillins Nausea And Vomiting  . Raloxifene Nausea And Vomiting  . Risedronate Sodium Nausea And Vomiting  . Sulfonamide Derivatives Nausea And Vomiting  . Teriparatide Nausea And Vomiting  . Travoprost     FAMILY HISTORY: Family History  Problem Relation Age of Onset  . Colon cancer Other        1st Cousin   . Stroke Mother 79  . Diabetes Brother   . Atrial fibrillation Brother        coumadin  . Lung cancer Sister  SOCIAL HISTORY: Social History   Socioeconomic History  . Marital status: Widowed    Spouse name: Not on file  . Number of children: 3  . Years of education: Not on file  . Highest education level: Some college, no degree  Occupational History    Employer: RETIRED  Tobacco Use  . Smoking status: Never Smoker  . Smokeless tobacco: Never Used  Vaping Use  . Vaping Use: Never used  Substance and Sexual Activity  . Alcohol use: No  . Drug use: No  . Sexual activity: Not on file  Other Topics Concern  . 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 She lives alone. She goes to the gym and participates in aerobics 3 x week. She drinks 3 cups of coffee a day.         Social Determinants of Health   Financial Resource Strain: Low Risk   . Difficulty of Paying Living Expenses: Not hard at all  Food Insecurity: No Food Insecurity  . Worried About Charity fundraiser  in the Last Year: Never true  . Ran Out of Food in the Last Year: Never true  Transportation Needs: No Transportation Needs  . Lack of Transportation (Medical): No  . Lack of Transportation (Non-Medical): No  Physical Activity: Insufficiently Active  . Days of Exercise per Week: 7 days  . Minutes of Exercise per Session: 20 min  Stress: No Stress Concern Present  . Feeling of Stress : Not at all  Social Connections: Socially Isolated  . Frequency of Communication with Friends and Family: More than three times a week  . Frequency of Social Gatherings with Friends and Family: More than three times a week  . Attends Religious Services: Never  . Active Member of Clubs or Organizations: No  . Attends Archivist Meetings: Never  . Marital Status: Widowed  Intimate Partner Violence: Not At Risk  . Fear of Current or Ex-Partner: No  . Emotionally Abused: No  . Physically Abused: No  . Sexually Abused: No     Objective:  Blood pressure (!) 146/81, pulse 77, height 5' (1.524 m), weight 109 lb 6.4 oz (49.6 kg), SpO2 98 %. General: No acute distress.  Patient appears well-groomed.   Head:  Normocephalic/atraumatic Eyes:  Fundi examined but not visualized Neck: supple, no paraspinal tenderness, full range of motion Heart:  Regular rate and rhythm Lungs:  Clear to auscultation bilaterally Back: No paraspinal tenderness Neurological Exam: alert and oriented to person, place, and time. Attention span and concentration intact, recent and remote memory intact, fund of knowledge intact.  Speech fluent and not dysarthric, language intact.  CN II-XII intact. Bulk and tone normal, muscle strength 5/5 throughout.  Sensation to pinprick reduced in lower extremities up to above knees.  Vibratory sensation reduced up to knees.  Deep tendon reflexes 1+ throughout, toes downgoing.  Finger to nose testing intact.  Gait with slight limp.  Romberg with sway.   Assessment/Plan:   1.  Patient does  have an underlying idiopathic polyneuropathy.  However, I do not think that explains her episodes of hot flashes.  It is not consistent with a peripheral neuropathy.  I really do not have an explanation for that.  Metta Clines, DO  CC:  Micheline Rough, MD

## 2020-04-22 ENCOUNTER — Telehealth: Payer: Self-pay | Admitting: Family Medicine

## 2020-04-22 ENCOUNTER — Telehealth: Payer: Self-pay | Admitting: Neurology

## 2020-04-22 NOTE — Telephone Encounter (Signed)
Patient seen her neurologist on 12/10 and he said that she needs a referral for nurophathy.  Please advise

## 2020-04-22 NOTE — Telephone Encounter (Signed)
Patient states that she was to see someone for her neuropathy and she was not sure if we sent a referral to someone or who she would need to see  Please call patient

## 2020-04-22 NOTE — Telephone Encounter (Signed)
I did not refer her to anybody for her neuropathy.  I do not believe that the symptoms that she is experiencing is due to her neuropathy.  She would need to follow back up with her PCP.

## 2020-04-22 NOTE — Telephone Encounter (Signed)
Pt advised of Dr.Jaffe note below.  

## 2020-04-22 NOTE — Telephone Encounter (Signed)
Pt is calling in stating the she went to see Dr. Loretta Plume and they told her the she does not need to see him and would like to see if she could get a referral to [facility] for neuropathy, please advise. CB is [phone]

## 2020-04-22 NOTE — Telephone Encounter (Signed)
Please advise 

## 2020-04-29 ENCOUNTER — Telehealth: Payer: Self-pay | Admitting: Family Medicine

## 2020-04-29 NOTE — Telephone Encounter (Signed)
Sorry this was sent when I was out of the office.   Dr. Tomi Likens stated this in his summary: Patient does have an underlying idiopathic polyneuropathy.  However, I do not think that explains her episodes of hot flashes.  It is not consistent with a peripheral neuropathy.  I really do not have an explanation for that.    She mentioned not wanting to keep seeing so many specialists at our last visit. If the neuropathy is bothering her the most, then we can refer to neurology to treat, but treatment options tend to involve medications like gabapentin and she has had some difficulty with tolerating meds in past. If she is worried more with the hot flashes, Dr. Tomi Likens did not think that these were related and so neurology referral may not be the most appropriate choice. I am happy to try some treatment for the neuropathy if she would like. We can help with referral; but I think we need to pinpoint what her goals of treatment/referral are so we know where to send her.

## 2020-04-29 NOTE — Telephone Encounter (Signed)
Pt would like a referral to the [facility] to see a Neuropathy Doctor . she wants to call their office to get an appointment. pt would like a call back to inform this has been done   [facility] Address: [address], [place], [place] [address]  [phone]

## 2020-04-29 NOTE — Telephone Encounter (Signed)
I'm ok with this. Please check with deborah on how to place this since they are not in our system.

## 2020-04-30 NOTE — Telephone Encounter (Signed)
Patient informed of the message below.

## 2020-04-30 NOTE — Telephone Encounter (Signed)
FYI

## 2020-05-06 ENCOUNTER — Encounter: Payer: Self-pay | Admitting: Family Medicine

## 2020-05-06 ENCOUNTER — Other Ambulatory Visit: Payer: Self-pay

## 2020-05-06 ENCOUNTER — Telehealth (INDEPENDENT_AMBULATORY_CARE_PROVIDER_SITE_OTHER): Payer: Medicare PPO | Admitting: Family Medicine

## 2020-05-06 VITALS — Wt 109.0 lb

## 2020-05-06 DIAGNOSIS — B001 Herpesviral vesicular dermatitis: Secondary | ICD-10-CM

## 2020-05-06 MED ORDER — VALACYCLOVIR HCL 500 MG PO TABS: ORAL_TABLET | ORAL | 1 refills | Status: DC

## 2020-05-06 NOTE — Progress Notes (Signed)
Virtual Visit via Telephone Note  I connected with [name] on 05/06/20 at 10:30 AM EST by telephone and verified that I am speaking with the correct person using two identifiers.   I discussed the limitations, risks, security and privacy concerns of performing an evaluation and management service by telephone and the availability of in person appointments. I also discussed with the patient that there may be a patient responsible charge related to this service. The patient expressed understanding and agreed to proceed.  Location patient: home Location provider:  [name]  [address] [place], [place] [address]  Participants present for the call: patient, provider Patient did not have a visit in the prior 7 days to address this/these issue(s).   History of Present Illness: Woke up weds with code sore and started using otc medication, but didn't help much. Son came in Tuesday night; and she thinks having family into town caused stress.   Lip swollen in middle of bottom still and tender. Son has valtrex and let her use this and since she mentioned it Thursday night into Friday; took 1 Friday, 2 sat, 1 Sunday. Swelling is down; she still feels it across lower lip, esp in middle. There is red spot, not blister. Son says doesn't look infected. No drainage. No spots elsewhere.   Son wondering with flushing/nerve pain if there is continuous nerve pain.  Wondering about longer-term use of antiviral and if this would be helpful for her.   Observations/Objective: Patient sounds cheerful and well on the phone. I do not appreciate any SOB. Speech and thought processing are grossly intact. Patient reported vitals: N/A  Assessment and Plan: 1. Herpes labialis She has had some improvement since starting Valtrex.  She still having some tenderness and slight swelling in this area, so we will continue a course for a few more days.  She does seem to tolerate medication well and has  had periods where she is felt better on this medication in terms of hot flashes.  I am happy to let her try this for a longer period of time to see if we can suppress cold sores and if she does feel better overall on medication, this would be something very easy and benign for her to continue.   Follow Up Instructions: We will do a approximate 1 month follow-up   [id number] 5-10 [id number] 11-20 [id number] 21-30 I did not refer this patient for an OV in the next 24 hours for this/these issue(s).  I discussed the assessment and treatment plan with the patient. The patient was provided an opportunity to ask questions and all were answered. The patient agreed with the plan and demonstrated an understanding of the instructions.   The patient was advised to call back or seek an in-person evaluation if the symptoms worsen or if the condition fails to improve as anticipated.  I provided 15 minutes of non-face-to-face time during this encounter.   Theodis Shove, MD

## 2020-05-07 ENCOUNTER — Telehealth: Payer: Self-pay | Admitting: *Deleted

## 2020-05-07 NOTE — Telephone Encounter (Signed)
-----   Message from Wynn Banker, MD sent at 05/06/2020 12:18 PM EST ----- Needs follow up phone visit in about a month, please

## 2020-05-07 NOTE — Telephone Encounter (Signed)
Spoke with the pt and scheduled an appt for 06/07/2020.

## 2020-05-20 ENCOUNTER — Telehealth: Payer: Self-pay | Admitting: Family Medicine

## 2020-05-20 NOTE — Telephone Encounter (Signed)
Randi the Triage Nurse the purpose of her call due to the pt taking valacyclovir (VALTREX) 500 MG and has been taking this for a while and pt stated that she has had a decrease out put of urine,dizziness and blurred vision, adverse reactions and Randi advised ED and the pt refused to go and want to speak with Dr. Ethlyn Gallery pt hung up.

## 2020-05-21 ENCOUNTER — Encounter (INDEPENDENT_AMBULATORY_CARE_PROVIDER_SITE_OTHER): Payer: Self-pay | Admitting: Ophthalmology

## 2020-05-21 ENCOUNTER — Ambulatory Visit (INDEPENDENT_AMBULATORY_CARE_PROVIDER_SITE_OTHER): Payer: Medicare PPO | Admitting: Ophthalmology

## 2020-05-21 ENCOUNTER — Telehealth: Payer: Self-pay | Admitting: Neurology

## 2020-05-21 ENCOUNTER — Other Ambulatory Visit: Payer: Self-pay

## 2020-05-21 DIAGNOSIS — H31003 Unspecified chorioretinal scars, bilateral: Secondary | ICD-10-CM | POA: Diagnosis not present

## 2020-05-21 DIAGNOSIS — H353132 Nonexudative age-related macular degeneration, bilateral, intermediate dry stage: Secondary | ICD-10-CM

## 2020-05-21 DIAGNOSIS — H401133 Primary open-angle glaucoma, bilateral, severe stage: Secondary | ICD-10-CM

## 2020-05-21 NOTE — Assessment & Plan Note (Signed)
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  Fundus photography will be done to document these findings

## 2020-05-21 NOTE — Telephone Encounter (Signed)
Patient called and said she has found a doctor at [facility] [phone], patient provided), "a neuropathy doctor."   She said, "Dr. Tomi Likens told me I need a medical transfer for my neuropathy. I've been working on this since December. Will someone please call me about getting this taken care of."

## 2020-05-21 NOTE — Telephone Encounter (Signed)
LMOVM : refer to 04/21/20 note I did not refer her to anybody for her neuropathy.  I do not believe that the symptoms that she is experiencing is due to her neuropathy.  She would need to follow back up with her PCP.

## 2020-05-21 NOTE — Progress Notes (Signed)
05/21/2020     CHIEF COMPLAINT Patient presents for Blurred Vision (Pt wants second opinion for ARMD and Glaucoma dx. Pt saw Dr. Kathlen Mody in August. Pt was seen at [facility] in November. Pt c/o blurry vision OU. Pt also has dizzy spells. Pt has been treated for glaucoma in the past, has tried Cusprost, Travoprost, Dorzolamide. Has not used gtts since August. Denies FOL and floaters.)   HISTORY OF PRESENT ILLNESS: Heidi Moore is a 87 y.o. female who presents to the clinic today for:   HPI    Blurred Vision    In both eyes.  Onset was gradual.  Vision is blurred.  Severity is moderate.  Occurring constantly.  It is worse throughout the day.  Context:  distance vision, mid-range vision and near vision.  Since onset it is stable.  Treatments tried include no treatments.  Response to treatment was no improvement.  I, the attending physician,  performed the HPI with the patient and updated documentation appropriately. Additional comments: Pt wants second opinion for ARMD and Glaucoma dx. Pt saw Dr. Kathlen Mody in August. Pt was seen at [facility] in November. Pt c/o blurry vision OU. Pt also has dizzy spells. Pt has been treated for glaucoma in the past, has tried Cusprost, Travoprost, Dorzolamide. Has not used gtts since August. Denies FOL and floaters.       Last edited by Tilda Franco on 05/21/2020 11:07 AM. (History)      Referring physician: Caren Macadam, MD [place],  [place] [address]  HISTORICAL INFORMATION:   Selected notes from the MEDICAL RECORD NUMBER    Lab Results  Component Value Date   HGBA1C 5.3 12/10/2015     CURRENT MEDICATIONS: Current Outpatient Medications (Ophthalmic Drugs)  Medication Sig  . Polyethyl Glycol-Propyl Glycol (SYSTANE OP) Apply to eye daily.   No current facility-administered medications for this visit. (Ophthalmic Drugs)   Current Outpatient Medications (Other)  Medication Sig  . acetaminophen (TYLENOL) 325 MG  tablet Take 650 mg by mouth every 6 (six) hours as needed for mild pain or headache.  . Ascorbic Acid (VITAMIN C PO) Take 500 mg by mouth 2 (two) times daily.  . B Complex-C (B-COMPLEX WITH VITAMIN C) tablet Take 1 tablet by mouth daily.  . cholecalciferol (VITAMIN D) 1000 UNITS tablet Take 1,000 Units by mouth 2 (two) times daily.   [name] estradiol (ESTRACE) 0.5 MG tablet Take 0.5 mg by mouth daily.   [name] OVER THE COUNTER MEDICATION Take 1 capsule by mouth 2 (two) times daily. [place]7 daily.  [name] OVER THE COUNTER MEDICATION 2 (two) times daily. Preservision vitamin for eyes  . TURMERIC CURCUMIN PO Take by mouth daily.  . valACYclovir (VALTREX) 500 MG tablet Take 2 tablets PO BID x 4 days, then continue with 1 tablet PO daily  . vitamin E 400 UNIT capsule Take 1 capsule by mouth daily.   No current facility-administered medications for this visit. (Other)      REVIEW OF SYSTEMS:    ALLERGIES Allergies  Allergen Reactions  . Alendronate Sodium Nausea And Vomiting  . Anaplex Hd Nausea And Vomiting  . Aspirin Nausea And Vomiting  . Azithromycin Nausea And Vomiting  . Calcium Nausea And Vomiting  . Ciprofloxacin     Patient states this caused nausea, dizziness and affected her muscles  . Codeine Nausea And Vomiting  . Dorzolamide Hcl-Timolol Mal Other (See Comments)  . Doxycycline Nausea And Vomiting  .  Hydrocodone Nausea And Vomiting  . Ibandronate Sodium     REACTION: severe body aches  . Ibuprofen Nausea And Vomiting  . Penicillins Nausea And Vomiting  . Raloxifene Nausea And Vomiting  . Risedronate Sodium Nausea And Vomiting  . Sulfonamide Derivatives Nausea And Vomiting  . Teriparatide Nausea And Vomiting  . Travoprost     PAST MEDICAL HISTORY Past Medical History:  Diagnosis Date  . AC (acromioclavicular) joint bone spurs    on right shoulder   . Allergic rhinitis   . Arthritis   . Bursitis    right shoulder  . Complication of anesthesia    headaches   . Diverticulosis   . DIVERTICULOSIS, COLON 10/15/2006   Qualifier: Diagnosis of  By: Linna Darner MD, Gwyndolyn Saxon Eye problem    treated by Dr Kathlen Mody per patient  . Headache(784.0)    sinus HA  . History of blood transfusion 29yrs ago   no abnormal reaction noted  . History of cystitis   . History of skin cancer    Basal cell, Dr. Lindwood Coke  . Hyperlipidemia    [place] Study LDL goal =<160   . Hypotension   . Joint pain   . Joint swelling   . Macular degeneration, dry   . Osteopenia    Intolerance to Calcium,Actonel,Fosamax,Evista, Forteo: S/P Boniva 2007 to 03/2010  . OSTEOPENIA 03/12/2008   Qualifier: Diagnosis of  By: Velora Heckler    . Osteoporosis   . PONV (postoperative nausea and vomiting)   . Rosacea 04/22/2009   Qualifier: Diagnosis of  By: Linna Darner MD, Gwyndolyn Saxon    . Skin cancer 06/22/2017   per patient after excision right calf by Dr Ubaldo Glassing  . SKIN CANCER, HX OF 03/12/2008   Qualifier: Diagnosis of  By: Velora Heckler    . Urinary frequency   . Vaginal atrophy    sees Dr. Gertie Fey   Past Surgical History:  Procedure Laterality Date  . APPENDECTOMY    . BUNIONECTOMY Bilateral   . CATARACT EXTRACTION, BILATERAL    . COLONOSCOPY     with Tics (no further f/u as per GI due to age)   . JOINT REPLACEMENT Left    knee   . KNEE ARTHROPLASTY  05/18/2012   Procedure: COMPUTER ASSISTED TOTAL KNEE ARTHROPLASTY;  Surgeon: Marybelle Killings, MD;  Location: [facility];  Service: Orthopedics;  Laterality: Left;  Left Total Knee Arthroplasty, Cemented, Computer Assisted  . lt knee      meniscus tear  . MOHS SURGERY     right nostril for basel cell cancer   . NOSE SURGERY     due to trama at age 39   . TOTAL ABDOMINAL HYSTERECTOMY W/ BILATERAL SALPINGOOPHORECTOMY     for Endometriosis   . TOTAL SHOULDER ARTHROPLASTY Right 09/11/2013   Procedure: TOTAL SHOULDER ARTHROPLASTY;  Surgeon: Marybelle Killings, MD;  Location: [facility];  Service: Orthopedics;  Laterality: Right;  Right Total Shoulder  Arthroplasty    FAMILY HISTORY Family History  Problem Relation Age of Onset  . Colon cancer Other        1st Cousin   . Stroke Mother 19  . Diabetes Brother   . Atrial fibrillation Brother        coumadin  . Lung cancer Sister     SOCIAL HISTORY Social History   Tobacco Use  . Smoking status: Never Smoker  . Smokeless tobacco: Never Used  Vaping Use  . Vaping Use: Never used  Substance Use Topics  . Alcohol use: No  . Drug use: No         OPHTHALMIC EXAM:  Base Eye Exam    Visual Acuity (Snellen - Linear)      Right Left   Dist [place] 20/100 -1 20/80 -1   Dist ph  20/80 -1 20/60 -1       Tonometry (Tonopen, 11:15 AM)      Right Left   Pressure 16 15       Pupils      Pupils Dark Light Shape React APD   Right PERRL 3 2 Round Sluggish None   Left PERRL 3 2 Round Sluggish None       Visual Fields (Counting fingers)      Left Right    Full Full       Neuro/Psych    Oriented x3: Yes   Mood/Affect: Normal       Dilation    Both eyes: 1.0% Mydriacyl, 2.5% Phenylephrine @ 11:15 AM        Slit Lamp and Fundus Exam    External Exam      Right Left   External Normal Normal       Slit Lamp Exam      Right Left   Lids/Lashes Normal Normal   Conjunctiva/Sclera White and quiet White and quiet   Cornea Clear Clear   Anterior Chamber Deep and quiet Deep and quiet   Iris Round and reactive Round and reactive   Lens Centered posterior chamber intraocular lens, Open posterior capsule Centered posterior chamber intraocular lens   Anterior Vitreous Normal Normal       Fundus Exam      Right Left   Posterior Vitreous Posterior vitreous detachment Posterior vitreous detachment   Disc 1+ Optic disc atrophy, glaucomatous 1+ Optic disc atrophy, glaucomatous   C/D Ratio 0.95 0.9   Macula Hard drusen, Early age related macular degeneration Hard drusen, Early age related macular degeneration   Vessels Normal Normal   Periphery Mid peripheral chorioretinal  scars and active Chorioretinal scar temporal and inferotemporal to the macula, inactive, pigmented edges, 4 disc areas in size.  Otherwise several other multiple punched-out chorioretinal scars each and active          IMAGING AND PROCEDURES  Imaging and Procedures for 05/21/20  OCT, Retina - OU - Both Eyes       Right Eye Quality was good. Scan locations included subfoveal. Central Foveal Thickness: 257. Progression has no prior data. Findings include no IRF, no SRF, abnormal foveal contour, retinal drusen .   Left Eye Quality was good. Scan locations included subfoveal. Central Foveal Thickness: 256. Progression has no prior data. Findings include abnormal foveal contour, retinal drusen , no SRF, no IRF.   Notes No active CN VM OU.  Will continue to monitor macular status.       Color Fundus Photography Optos - OU - Both Eyes       Right Eye Progression has no prior data. Disc findings include increased cup to disc ratio, pallor, thinning of rim. Macula : drusen, geographic atrophy. Vessels : normal observations.   Left Eye Progression has no prior data. Disc findings include increased cup to disc ratio, pallor, thinning of rim. Macula : drusen, geographic atrophy. Vessels : normal observations.   Notes Peripheral chorioretinal scars and active, longstanding Advanced cupping noted OU.                ASSESSMENT/PLAN:  Intermediate stage nonexudative age-related macular degeneration of both eyes The nature of age--related macular degeneration was discussed with the patient as well as the distinction between dry and wet types. Checking an Amsler Grid daily with advice to return immediately should a distortion develop, was given to the patient. The patient 's smoking status now and in the past was determined and advice based on the AREDS study was provided regarding the consumption of antioxidant supplements. AREDS 2 vitamin formulation was recommended. Consumption of  dark leafy vegetables and fresh fruits of various colors was recommended. Treatment modalities for wet macular degeneration particularly the use of intravitreal injections of anti-blood vessel growth factors was discussed with the patient. Avastin, Lucentis, and Eylea are the available options. On occasion, therapy includes the use of photodynamic therapy and thermal laser. Stressed to the patient do not rub eyes.  Patient was advised to check Amsler Grid daily and return immediately if changes are noted. Instructions on using the grid were given to the patient. All patient questions were answered.  Primary open angle glaucoma of both eyes, severe stage Follow-up with Dr. Quentin Ore as scheduled  Scarring, chorioretinal, bilateral No specific therapy warranted for the old chorioretinal scars.  Fundus photography will be done to document these findings      ICD-10-CM   1. Scarring, chorioretinal, bilateral  H31.003 Color Fundus Photography Optos - OU - Both Eyes  2. Intermediate stage nonexudative age-related macular degeneration of both eyes  H35.3132 OCT, Retina - OU - Both Eyes  3. Primary open angle glaucoma of both eyes, severe stage  H40.1133     1.  No signs of active retinal pathology at this time.  2.  3.  Ophthalmic Meds Ordered this visit:  No orders of the defined types were placed in this encounter.      Return in about 6 months (around 11/18/2020) for DILATE OU, OCT, COLOR FP.  There are no Patient Instructions on file for this visit.   Explained the diagnoses, plan, and follow up with the patient and they expressed understanding.  Patient expressed understanding of the importance of proper follow up care.   Clent Demark Nirvi Boehler [name] Diseases & Surgery of the Retina and Vitreous Retina & Diabetic [place] 05/21/20     Abbreviations: M myopia (nearsighted); A astigmatism; H hyperopia (farsighted); P presbyopia; Mrx spectacle prescription;  CTL contact lenses; OD  right eye; OS left eye; OU both eyes  XT exotropia; ET esotropia; PEK punctate epithelial keratitis; PEE punctate epithelial erosions; DES dry eye syndrome; MGD meibomian gland dysfunction; ATs artificial tears; PFAT's preservative free artificial tears; [place] nuclear sclerotic cataract; PSC posterior subcapsular cataract; ERM epi-retinal membrane; PVD posterior vitreous detachment; RD retinal detachment; DM diabetes mellitus; DR diabetic retinopathy; NPDR non-proliferative diabetic retinopathy; PDR proliferative diabetic retinopathy; CSME clinically significant macular edema; DME diabetic macular edema; dbh dot blot hemorrhages; CWS cotton wool spot; POAG primary open angle glaucoma; C/D cup-to-disc ratio; HVF humphrey visual field; GVF goldmann visual field; OCT optical coherence tomography; IOP intraocular pressure; BRVO Branch retinal vein occlusion; CRVO central retinal vein occlusion; CRAO central retinal artery occlusion; BRAO branch retinal artery occlusion; RT retinal tear; SB scleral buckle; PPV pars plana vitrectomy; VH Vitreous hemorrhage; PRP panretinal laser photocoagulation; IVK intravitreal kenalog; VMT vitreomacular traction; MH Macular hole;  NVD neovascularization of the disc; NVE neovascularization elsewhere; AREDS age related eye disease study; ARMD age related macular degeneration; POAG primary open angle glaucoma; EBMD epithelial/anterior basement membrane dystrophy; ACIOL anterior chamber intraocular  lens; IOL intraocular lens; PCIOL posterior chamber intraocular lens; Phaco/IOL phacoemulsification with intraocular lens placement; [place] photorefractive keratectomy; LASIK laser assisted in situ keratomileusis; HTN hypertension; DM diabetes mellitus; COPD chronic obstructive pulmonary disease

## 2020-05-21 NOTE — Assessment & Plan Note (Signed)
Follow-up with Dr. Quentin Ore as scheduled

## 2020-05-21 NOTE — Telephone Encounter (Signed)
Fine to stop the valtrex. Please have her update me after she sees eye doc.

## 2020-05-21 NOTE — Assessment & Plan Note (Signed)

## 2020-05-22 NOTE — Telephone Encounter (Signed)
Patient informed of the message below.

## 2020-05-30 ENCOUNTER — Telehealth (INDEPENDENT_AMBULATORY_CARE_PROVIDER_SITE_OTHER): Payer: Medicare PPO | Admitting: Family Medicine

## 2020-05-30 DIAGNOSIS — M79604 Pain in right leg: Secondary | ICD-10-CM

## 2020-05-30 DIAGNOSIS — R7989 Other specified abnormal findings of blood chemistry: Secondary | ICD-10-CM

## 2020-05-30 DIAGNOSIS — M79605 Pain in left leg: Secondary | ICD-10-CM | POA: Diagnosis not present

## 2020-05-30 DIAGNOSIS — R232 Flushing: Secondary | ICD-10-CM

## 2020-05-30 MED ORDER — LEVOTHYROXINE SODIUM 50 MCG PO TABS: 25.0000 ug | ORAL_TABLET | Freq: Every day | ORAL | 0 refills | Status: DC

## 2020-05-30 NOTE — Progress Notes (Signed)
Virtual Visit via Telephone Note  I connected with Heidi Moore on 05/30/20 at  1:20 PM EST by telephone and verified that I am speaking with the correct person using two identifiers.   I discussed the limitations, risks, security and privacy concerns of performing an evaluation and management service by telephone and the availability of in person appointments. I also discussed with the patient that there may be a patient responsible charge related to this service. The patient expressed understanding and agreed to proceed.  Location patient: home, [place] Location provider: work or home office Participants present for the call: patient, provider Patient did not have a visit with me in the prior 7 days to address this/these issue(s).   History of Present Illness:  Acute telemedicine visit for flushed/hot flash like sensations in her legs: -Onset: years ago, started as numbness and stinging in the toes - but gradually has become where it is occurring in her legs, feels like "a fever in my leg" - now she feels like over the year this has become a sensation all over her body -she wonders if this can be her thyroid -she thinks started after having a toenail removed -more likely at night -Denies:fevers, weakness, numbness, chills -Has tried:has seen many specialist for this including several different neurologists, cardiologist, dermatologist, vascular doctor, rheumatologist -reports all the doctors have told her they can't help her and she is very frustrated with this    Observations/Objective: Patient sounds cheerful and well on the phone. I do not appreciate any SOB. Speech and thought processing are grossly intact. Patient reported vitals:  Assessment and Plan:  Flushing  Pain in both lower extremities  Abnormal thyroid blood test  -we discussed possible serious and likely etiologies, options for evaluation and workup, limitations of telemedicine visit vs in person visit, treatment,  treatment risks and precautions. Pt prefers to treat via telemedicine empirically rather than in person at this moment.  Patient reports chronic odd sensation of flushing discomfort in her legs, now throughout her whole body that started years ago, but seems to be worsening.  She is very frustrated that she has seen many many specialist and still has not found relief.  She has tried eating healthy, drinking water, exercising and nothing has helped.  She wonders if this could be a thyroid issue, as has a history of abnormal thyroid tests.  Reports talking with her primary care doctor about this in the past.  She is very sensitive to medications however.  She really wants to try something to help.  I did let her know that I am not sure what is causing her symptoms, but that thyroid disease can present in many ways.  She just had a TSH check last month and her level was elevated. We opted to try a very very low-dose of levothyroxine, 25 mcg daily for a few months with close follow-up with her PCP per her preference after discussion.  She already has a follow-up scheduled with her PCP later this month, but she reports the front office was not sure if her primary doctor would be in at that time.  I will send a message to her PCP office to ensure she has follow-up in the next 1 to 2 months to discuss this further and to monitor.  Did explain that it may take several months for any benefit to occur if this is related to her thyroid issue.  Also discussed trying a good emollient and capsaicin topical.  She reports she  was told she has neuropathy, but that is not what is causing her symptoms.  Scheduled follow up with PCP offered:  Sent message to schedulers to assist and advised patient to contact PCP office to schedule if does not receive call back in next 24 hours. Advised to seek prompt in person care if worsening, new symptoms arise, or if is not improving with treatment. Advised of options for inperson care in case  PCP office not available. Did let the patient know that I only do telemedicine shifts for [facility] on Tuesdays and Thursdays and advised a follow up visit with PCP or at an [facility] if has further questions or concerns.   Follow Up Instructions:  I did not refer this patient for an OV with me in the next 24 hours for this/these issue(s).  I discussed the assessment and treatment plan with the patient. The patient was provided an opportunity to ask questions and all were answered. The patient agreed with the plan and demonstrated an understanding of the instructions.   I spent 25 minutes on the date of this visit in the care of this patient. See summary of tasks completed to properly care for this patient in the detailed notes above which included previsit review of recent office visit notes, review of PMH, medications, allergies, counseling, consoling, evaluation of the patient and ordering and instructing patient on testing and care options.     Heidi Kern, DO

## 2020-05-30 NOTE — Patient Instructions (Signed)
-  I sent the medication(s) we discussed to your pharmacy: Meds ordered this encounter  Medications  . levothyroxine (SYNTHROID) 50 MCG tablet    Sig: Take 0.5 tablets (25 mcg total) by mouth daily.    Dispense:  45 tablet    Refill:  0   Follow up with Dr. Ethlyn Gallery in the next 1-2 months  I hope you are feeling better soon!  Seek in person care promptly if your symptoms worsen, new concerns arise or you are not improving with treatment.  It was nice to meet you today. I help [facility] out with telemedicine visits on Tuesdays and Thursdays and am available for visits on those days. If you have any concerns or questions following this visit please schedule a follow up visit with your Primary Care doctor or seek care at a local urgent care clinic to avoid delays in care.

## 2020-06-03 ENCOUNTER — Other Ambulatory Visit: Payer: Self-pay | Admitting: Family Medicine

## 2020-06-03 ENCOUNTER — Telehealth: Payer: Self-pay | Admitting: Family Medicine

## 2020-06-03 NOTE — Telephone Encounter (Signed)
Patient states that she would like to speak with Dr. Ethlyn Gallery regarding her most recent booster shot.  She states Dr. Ethlyn Gallery is aware of her her recent reactions that she has been having.  Please advise.

## 2020-06-03 NOTE — Telephone Encounter (Signed)
I spoke with patient for about 40 minutes. Please reschedule her Friday visit for a couple of weeks away.   (She is still having flushing, worse since getting COVID booster 12/2. She is aching, some joint swelling. Flushing worse at night. Had felt somewhat better prior to injection, but flushing hasn't gone away completely since starting over a year ago (started after allergy skin testing). No fevers. Skin feels warm. Cool rags help. She has had lip swelling, but started new herbal med - turmeric. She hasn't started thyroid med)  (I advised to try aspirin, enteric coated. See if able to tolerate with dinner. Hopefully this will help with flushing. She will let me know. Stop turmeric - this is new and lip has been swollen since stopping. Also advised stopping vitamin C. I don't think worthwhile to try thyroid med again; she felt worse on this med and tsh is barely out of normal; given age and allergic responses to med, I would hold off/monitor).

## 2020-06-03 NOTE — Telephone Encounter (Signed)
Spoke with the pt and rescheduled an appt for 06/26/2020.

## 2020-06-07 ENCOUNTER — Telehealth: Payer: Medicare PPO | Admitting: Family Medicine

## 2020-06-26 ENCOUNTER — Other Ambulatory Visit: Payer: Self-pay

## 2020-06-26 ENCOUNTER — Telehealth (INDEPENDENT_AMBULATORY_CARE_PROVIDER_SITE_OTHER): Payer: Medicare PPO | Admitting: Family Medicine

## 2020-06-26 DIAGNOSIS — R232 Flushing: Secondary | ICD-10-CM

## 2020-06-26 DIAGNOSIS — G629 Polyneuropathy, unspecified: Secondary | ICD-10-CM

## 2020-06-26 DIAGNOSIS — N952 Postmenopausal atrophic vaginitis: Secondary | ICD-10-CM

## 2020-06-26 MED ORDER — GABAPENTIN 100 MG PO CAPS: 100.0000 mg | ORAL_CAPSULE | Freq: Every day | ORAL | 2 refills | Status: DC

## 2020-06-26 MED ORDER — PREMARIN 0.625 MG/GM VA CREA: 1.0000 | TOPICAL_CREAM | Freq: Every day | VAGINAL | 12 refills | Status: DC

## 2020-06-26 NOTE — Progress Notes (Signed)
Virtual Visit via Telephone Note  I connected with@ on 06/26/20 at  2:30 PM EST by telephone and verified that I am speaking with the correct person using two identifiers.   I discussed the limitations, risks, security and privacy concerns of performing an evaluation and management service by telephone and the availability of in person appointments. I also discussed with the patient that there may be a patient responsible charge related to this service. The patient expressed understanding and agreed to proceed.  Location patient: home Location provider:  [facility]  [address] [place], [place] [address]  Participants present for the call: patient, provider Patient did not have a visit in the prior 7 days to address this/these issue(s).   History of Present Illness: Started back at silver sneakers.  Found new chiropractor, but didn't follow through with this because of costs. Had adjustment with different chiropractor yesterday and he will see her once weekly for a few weeks - hoping that this helps with her hip discomfort.   Had follow up with gynecologist- Dr. Huntley Dec to see if hormone balance was off (worries due to burning). She was feeling like she had to urinate all the time at bed  Not always waking up. Not sure if this is age related. Dr. Huntley Dec told her that urine was fine. She has started to drink more water. She did see urologist in the past.   She feels she is still reacting to COVID vaccine from 11 months ago.   Does still get some nerve pain in her toe; states she has had more numbness in toes. Knows she has arthritis, but feeling this in both   Still with flushing at night time.   Still wearing support hose to help with swelling. Has appt Monday with [facility] for follow up.   Burning sensation hasn't stopped since it started, but she did feel like it was better prior to December covid booster.  She is also having eye trouble. Eye pressure was better  13,14. She was sent to retinal specialist. She does follow up in a few months to recheck pressures. tradwell   Observations/Objective: Patient sounds cheerful and well on the phone. I do not appreciate any SOB. Speech and thought processing are grossly intact.   Assessment and Plan: 1. Neuropathy We are going to try low dose neurontin. She has seen vein and vascular, cardiology, neurology. I am hoping that she can get some relief from the neuropathy and that she also gets some benefit with hot flashes (see below).  - gabapentin (NEURONTIN) 100 MG capsule; Take 1 capsule (100 mg total) by mouth at bedtime.  Dispense: 30 capsule; Refill: 2  2. Hot flashes See above. Starting neurontin. Discussed new medication(s) today with patient. Discussed potential side effects and patient verbalized understanding. She does have a significant amount of anxiety; worse with these symptoms, so hopefully if able to tolerate gabapentin I will relieve some of anxiety as well.    3. Vaginal atrophy We discussed that use of estrogen cream as directed by gyn; may be helpful for some of sx. I instructed her to try using twice weekly for a couple of weeks as she was directed at last visit (in addition to oral estrogen). She does have improvement with vaginal irritation when using this and I feel it will help with incontinence to a degree.   Follow Up Instructions:  1 month follow up   99441 5-10 [id number] 11-20 9443 21-30 I did not  refer this patient for an OV in the next 24 hours for this/these issue(s).  I discussed the assessment and treatment plan with the patient. The patient was provided an opportunity to ask questions and all were answered. The patient agreed with the plan and demonstrated an understanding of the instructions.   The patient was advised to call back or seek an in-person evaluation if the symptoms worsen or if the condition fails to improve as anticipated.  I provided 40 minutes of  non-face-to-face time during this encounter. We discussed treatment options, concerns with side effects, we discussed other nerve therapies (otc medications she has heard of/seen).    Theodis Shove, MD

## 2020-06-28 ENCOUNTER — Telehealth: Payer: Self-pay | Admitting: Family Medicine

## 2020-06-28 NOTE — Telephone Encounter (Signed)
Pt call and stated she is no going to take the gabapentin (NEURONTIN) 100 MG capsule because of the side affects she stated she is going to use the cream that she was using before.

## 2020-06-28 NOTE — Telephone Encounter (Signed)
Did she try the gabapentin? Or just read about side effects. Please put on allergy list if this was intolerance and specifics. I am fine with her trying the estrogen cream as directed by her gynecologist.

## 2020-07-01 NOTE — Telephone Encounter (Signed)
Patient called back, was informed of the message below and stated she read the side effects which could cause gland problems and decided not to take the medication.  Message sent to PCP.

## 2020-07-01 NOTE — Telephone Encounter (Signed)
Left a message for the pt to return my call.  

## 2020-07-03 ENCOUNTER — Telehealth: Payer: Self-pay | Admitting: *Deleted

## 2020-07-03 NOTE — Telephone Encounter (Signed)
-----   Message from Caren Macadam, MD sent at 06/27/2020  6:58 PM EST ----- She needs follow up visit in a month

## 2020-07-03 NOTE — Telephone Encounter (Signed)
Spoke with the pt and scheduled an appt for 4/13.

## 2020-07-03 NOTE — Telephone Encounter (Signed)
Noted  

## 2020-07-05 ENCOUNTER — Telehealth: Payer: Self-pay | Admitting: Family Medicine

## 2020-07-05 ENCOUNTER — Other Ambulatory Visit: Payer: Self-pay | Admitting: Family Medicine

## 2020-07-05 ENCOUNTER — Encounter: Payer: Self-pay | Admitting: Family Medicine

## 2020-07-05 MED ORDER — CLINDAMYCIN HCL 300 MG PO CAPS: 300.0000 mg | ORAL_CAPSULE | Freq: Three times a day (TID) | ORAL | 0 refills | Status: AC

## 2020-07-05 NOTE — Telephone Encounter (Signed)
Patient is calling and stated that she went to the dentist recently and found out she had a tooth infection. Per patient she wanted provider to treat her instead and wanted to see if she can send Clindamycin to [address] [place] [address]  Phone:  [phone] Fax:  [phone]  CB is [phone]

## 2020-07-05 NOTE — Telephone Encounter (Signed)
Ok. I sent in clindamycin for her. She has had this in the past and it is one of only meds not on allergy list. Should work great for tooth infection.

## 2020-07-05 NOTE — Telephone Encounter (Signed)
Patient informed of the message below.

## 2020-07-05 NOTE — Telephone Encounter (Signed)
This message must have come after I sent other message. I did already send clindamycin for her; see other phone note.

## 2020-07-05 NOTE — Telephone Encounter (Signed)
See prior phone note. 

## 2020-07-05 NOTE — Telephone Encounter (Signed)
The patient called to see if Dr. Ethlyn Gallery can call her in an antibiotic.  She went to the dentist yesterday and they told her that she has an infection in her tooth and that she needs an antibiotic. She doesn't remember which one she is allergic too.   She goes to the surgeon on 03/03 to see about getting the tooth pulled.  She would like for JoAnne to call her back.

## 2020-07-29 ENCOUNTER — Telehealth: Payer: Self-pay | Admitting: Family Medicine

## 2020-07-29 NOTE — Telephone Encounter (Signed)
Patient is calling and wanted to speak to someone regarding an antibiotic that was given to her, please advise. CB is [phone]

## 2020-07-29 NOTE — Telephone Encounter (Signed)
Left a message requesting the pt call back with a detailed message as to what her questions are regarding a medication.

## 2020-07-30 NOTE — Telephone Encounter (Signed)
Pt is calling in to see if she is able to take the antibiotic that was given to her for her tooth.  Pt is going to the dentist on Thursday and would like to know if it is okay for her to take the antibiotic for the R ear pain it is on the same side as where she had gotten the tooth pulled.  Pt would like to have a call back.

## 2020-07-31 NOTE — Telephone Encounter (Signed)
She had clindamycin before. I'm not clear about her question but clindamycin should be helpful both in mouth and for ear issue if infected; although pain from teeth is often referred to the ear so may be best to check ear if there is question of infection rather than taking unnecessary antibiotic.

## 2020-07-31 NOTE — Telephone Encounter (Signed)
Patient informed of the message below.  Patient stated she has aching in the right tooth, was advised to contact the dentist for tooth pain and agreed to call back if needed.

## 2020-08-14 ENCOUNTER — Telehealth: Payer: Self-pay | Admitting: Family Medicine

## 2020-08-14 ENCOUNTER — Encounter: Payer: Self-pay | Admitting: Family Medicine

## 2020-08-14 ENCOUNTER — Ambulatory Visit: Payer: Medicare PPO | Admitting: Family Medicine

## 2020-08-14 ENCOUNTER — Other Ambulatory Visit: Payer: Self-pay

## 2020-08-14 VITALS — BP 124/70 | HR 76 | Temp 98.0°F | Wt 111.0 lb

## 2020-08-14 DIAGNOSIS — L03115 Cellulitis of right lower limb: Secondary | ICD-10-CM

## 2020-08-14 MED ORDER — MUPIROCIN CALCIUM 2 % EX CREA: 1.0000 "application " | TOPICAL_CREAM | Freq: Two times a day (BID) | CUTANEOUS | 0 refills | Status: DC

## 2020-08-14 MED ORDER — CLINDAMYCIN HCL 300 MG PO CAPS: 300.0000 mg | ORAL_CAPSULE | Freq: Three times a day (TID) | ORAL | 0 refills | Status: DC

## 2020-08-14 NOTE — Telephone Encounter (Signed)
The insurance doesn't cover the cream for mupirocin cream (BACTROBAN) 2 % but they will cover the ointment.  [place], [place] - [address] Phone:  [phone]  Fax:  [phone]

## 2020-08-14 NOTE — Progress Notes (Signed)
   Subjective:    Patient ID: Heidi Moore, female    DOB: 08/30/1919, 87 y.o.   MRN: [id number]  HPI Here with her daughter to check a laceration on the right lower leg. On 08-01-20 as she was getting out of the car, the corner of the car door struck her on the leg causing a laceration. She went to an urgent care where this was closed with a running suture. She has been dressing it twice a day with Mupiricin cream, but she is worried it may be getting infected. The area is red and mildly painful. No fever.    Review of Systems  Constitutional: Negative.   Respiratory: Negative.   Cardiovascular: Negative.   Skin: Positive for wound.       Objective:   Physical Exam Constitutional:      Appearance: Normal appearance.     Comments: Walks with a cane   Cardiovascular:     Rate and Rhythm: Normal rate and regular rhythm.     Pulses: Normal pulses.     Heart sounds: Normal heart sounds.  Pulmonary:     Effort: Pulmonary effort is normal.     Breath sounds: Normal breath sounds.  Skin:    Comments: The lateral right lower leg has a long L-shaped laceration with a running suture in place. The area is warm and eyrthematous and slightly tender. No drainage is visible. No streaking.  Neurological:     Mental Status: She is alert.           Assessment & Plan:  There is an early cellulitis around the laceration. She will continue to apply Mupiricin cream to the area BID. We will start her on Clindamycin 300 mg TID for 10 days. She is scheduled for a routine visit with Dr. Ethlyn Gallery on 08-21-20, and this would be a good time to remove the sutures.  Alysia Penna, MD

## 2020-08-15 MED ORDER — MUPIROCIN 2 % EX OINT: 1.0000 "application " | TOPICAL_OINTMENT | Freq: Two times a day (BID) | CUTANEOUS | 0 refills | Status: DC

## 2020-08-15 NOTE — Telephone Encounter (Signed)
I sent in for the ointment

## 2020-08-15 NOTE — Addendum Note (Signed)
Addended by: Alysia Penna A on: 08/15/2020 08:03 AM   Modules accepted: Orders

## 2020-08-21 ENCOUNTER — Other Ambulatory Visit: Payer: Self-pay

## 2020-08-21 ENCOUNTER — Encounter: Payer: Self-pay | Admitting: Family Medicine

## 2020-08-21 ENCOUNTER — Ambulatory Visit: Payer: Medicare PPO | Admitting: Family Medicine

## 2020-08-21 VITALS — BP 104/68 | HR 71 | Temp 97.8°F | Ht 60.0 in | Wt 113.5 lb

## 2020-08-21 DIAGNOSIS — S81801D Unspecified open wound, right lower leg, subsequent encounter: Secondary | ICD-10-CM | POA: Diagnosis not present

## 2020-08-21 DIAGNOSIS — S81801S Unspecified open wound, right lower leg, sequela: Secondary | ICD-10-CM | POA: Diagnosis not present

## 2020-08-21 DIAGNOSIS — L03115 Cellulitis of right lower limb: Secondary | ICD-10-CM

## 2020-08-21 MED ORDER — CLINDAMYCIN HCL 300 MG PO CAPS: 300.0000 mg | ORAL_CAPSULE | Freq: Two times a day (BID) | ORAL | 0 refills | Status: DC

## 2020-08-21 MED ORDER — CORTISPORIN-TC 3.3-3-10-0.5 MG/ML OT SUSP: 3.0000 [drp] | Freq: Three times a day (TID) | OTIC | 0 refills | Status: DC | PRN

## 2020-08-21 NOTE — Progress Notes (Signed)
Heidi Moore DOB: 1919-05-23 Encounter date: 08/21/2020  This is a 87 y.o. female who presents with Chief Complaint  Patient presents with  . Follow-up    History of present illness: Had been on antibiotics after large molar pulled early march. Then 3/24 had aching in left upper gum and went to see dentist and car door slammed into right lower leg and caught her leg; peeled back skin. Saw provider at [facility] - stitched wound up; wrapped by nurse. Left bandage on for 5 days (was told to leave it on for 10 days); but went back because hurting at 6 days; given triple abx to put on BID and leave off bandage. Did see Dr. Sarajane Jews on 4/6 and was given clindamycin as well as mupirocin to wound. Has been doing the topical antibiotic for this. Clindamycin doesn't make her feel well.  She is only able to handle taking twice a day max.  She certainly does not feel that she can take 3 times a day, even though she understands that this is how it is best used for treatment.  Has some urinary leakage - doesn't always wake up at night to urinate. Doesn't want to try medications due to worry of side effects.   Has felt dizzy, chilled, headaches. Has had blurred vision - not sure if related to chronic eye issues - but felt it was worse recently.  She then states that the symptoms are all worse when she is taking an antibiotic.     Allergies  Allergen Reactions  . Alendronate Sodium Nausea And Vomiting  . Anaplex Hd Nausea And Vomiting  . Aspirin Nausea And Vomiting  . Azithromycin Nausea And Vomiting  . Calcium Nausea And Vomiting  . Ciprofloxacin     Patient states this caused nausea, dizziness and affected her muscles  . Codeine Nausea And Vomiting  . Dorzolamide Hcl-Timolol Mal Other (See Comments)  . Doxycycline Nausea And Vomiting  . Hydrocodone Nausea And Vomiting  . Ibandronate Sodium     REACTION: severe body aches  . Ibuprofen Nausea And Vomiting  . Penicillins Nausea And  Vomiting  . Raloxifene Nausea And Vomiting  . Risedronate Sodium Nausea And Vomiting  . Sulfonamide Derivatives Nausea And Vomiting  . Teriparatide Nausea And Vomiting  . Travoprost    Current Meds  Medication Sig  . acetaminophen (TYLENOL) 325 MG tablet Take 650 mg by mouth every 6 (six) hours as needed for mild pain or headache.  . Ascorbic Acid (VITAMIN C PO) Take 500 mg by mouth 2 (two) times daily.  . B Complex-C (B-COMPLEX WITH VITAMIN C) tablet Take 1 tablet by mouth daily.  . cholecalciferol (VITAMIN D) 1000 UNITS tablet Take 1,000 Units by mouth 2 (two) times daily.   [name] conjugated estrogens (PREMARIN) vaginal cream Place 1 Applicatorful vaginally daily.  [name] estradiol (ESTRACE) 0.5 MG tablet Take 0.5 mg by mouth daily.   [name] gabapentin (NEURONTIN) 100 MG capsule Take 1 capsule (100 mg total) by mouth at bedtime.  [name] levothyroxine (SYNTHROID) 50 MCG tablet Take 0.5 tablets (25 mcg total) by mouth daily.  . mupirocin ointment (BACTROBAN) 2 % Apply 1 application topically 2 (two) times daily.  [name] OVER THE COUNTER MEDICATION Take 1 capsule by mouth 2 (two) times daily. [place]7 daily.  [name] OVER THE COUNTER MEDICATION 2 (two) times daily. Preservision vitamin for eyes  . Polyethyl Glycol-Propyl Glycol (SYSTANE OP) Apply to eye daily.  . vitamin E 400 UNIT capsule  Take 1 capsule by mouth daily.  . [DISCONTINUED] clindamycin (CLEOCIN) 300 MG capsule Take 1 capsule (300 mg total) by mouth 3 (three) times daily.    Review of Systems  Constitutional: Positive for fatigue. Negative for chills and fever.  Eyes: Positive for visual disturbance (chronic, macular degeneration).  Respiratory: Negative for cough, chest tightness, shortness of breath and wheezing.   Cardiovascular: Positive for leg swelling (mild, improved). Negative for chest pain and palpitations.  Skin: Positive for wound (improved; still sore around wound).  Neurological: Positive for dizziness (has been present for  over a year; intermittent. worse when taking medications (like antibiotics)).    Objective:  BP 104/68 (BP Location: Left Arm, Patient Position: Sitting, Cuff Size: Normal)   Pulse 71   Temp 97.8 F (36.6 C) (Oral)   Ht 5' (1.524 m)   Wt 113 lb 8 oz (51.5 kg)   BMI 22.17 kg/m   Weight: 113 lb 8 oz (51.5 kg)   BP Readings from Last 3 Encounters:  08/21/20 104/68  08/14/20 124/70  04/19/20 (!) 146/81   Wt Readings from Last 3 Encounters:  08/21/20 113 lb 8 oz (51.5 kg)  08/14/20 111 lb (50.3 kg)  05/06/20 109 lb (49.4 kg)    Physical Exam Constitutional:      General: She is not in acute distress.    Appearance: She is well-developed.  Cardiovascular:     Rate and Rhythm: Normal rate and regular rhythm.     Heart sounds: Normal heart sounds. No murmur heard. No friction rub.  Pulmonary:     Effort: Pulmonary effort is normal. No respiratory distress.     Breath sounds: Normal breath sounds. No wheezing or rales.  Musculoskeletal:     Right lower leg: No edema.     Left lower leg: No edema.  Skin:    Comments: Right lower leg -wound edges are not well approximated, there is some epibole of wound edges, however wound is completely closed.  Skin has overgrown multiple sutures in the running stitch.  After wound was soaked with warm water, running stitch was easily removed from right lower extremity.  Patient tolerated this procedure well.  There is no drainage from the wound.  There is some light erythema and tenderness with palpation around the wound.  Per patient and daughter, redness is significantly improved since taking clindamycin.  Swelling is improved.  Neurological:     Mental Status: She is alert and oriented to person, place, and time.  Psychiatric:        Behavior: Behavior normal.     Assessment/Plan   1. Leg wound, right, sequela Encouraged patient to use warm soapy soaks on right lower extremity scabbing over area of wound, to soften this for the next few  days.  Dry wound after soaks and then continue to apply Bactroban ointment for best wound healing.  Keep leg elevated to keep swelling down.  Let us know if any worsening of pain, redness, swelling. - PR REMOVAL OF SUTURES  2. Cellulitis of right lower extremity Seems that this is improved somewhat since prior visit, but she does still have some erythema and warmth around suture site.  I have asked her to use clindamycin for the next week twice daily to make sure resolve infection.  Okay to take probiotic along with this.  Keep leg elevated deep swelling down to promote better healing.   Return in about 1 week (around 08/28/2020) for ok to end on to end of  my day on 4/25 monday. Micheline Rough, MD

## 2020-08-21 NOTE — Patient Instructions (Signed)
Leg care:   For next three days: warm soapy wash cloth soak for 10-20 minutes to help scab go away; gently wipe off scar after soaking. Then apply bactroban ointment to dry scar.   Keep legs elevated whenever sitting and at bedtime to keep swelling down.   Take the clindamycin twice daily for next 7 days.   Call if any worsening of pain, redness, swelling.

## 2020-08-22 ENCOUNTER — Ambulatory Visit: Payer: Medicare PPO | Admitting: Neurology

## 2020-09-02 ENCOUNTER — Ambulatory Visit: Payer: Medicare PPO | Admitting: Family Medicine

## 2020-09-02 ENCOUNTER — Other Ambulatory Visit: Payer: Self-pay

## 2020-09-02 ENCOUNTER — Encounter: Payer: Self-pay | Admitting: Family Medicine

## 2020-09-02 VITALS — BP 118/60 | HR 76 | Temp 97.9°F | Ht 60.0 in | Wt 112.3 lb

## 2020-09-02 DIAGNOSIS — S81811D Laceration without foreign body, right lower leg, subsequent encounter: Secondary | ICD-10-CM | POA: Diagnosis not present

## 2020-09-02 NOTE — Progress Notes (Signed)
Heidi Moore DOB: 17-Mar-1920 Encounter date: 09/02/2020  This is a 87 y.o. female who presents with Chief Complaint  Patient presents with  . Follow-up    History of present illness: Last visit was a week and a half ago for suture removal from right lower extremity.  She had lacerated leg on a car door and had a large skin tear which required stitching at urgent care.  This was complicated with cellulitis and her inability to tolerate antibiotic medications.  At last visit she was put on clindamycin 3 times daily (patient can only tolerate twice daily however) for 1 week and sutures were removed.  Does have some aching; some tenderness and still has some swelling of her right lower extremity.  Overall feels much better than it did before.  She has been putting topical antibiotic ointment on it, but she feels that her skin is getting irritated from this.   Allergies  Allergen Reactions  . Alendronate Sodium Nausea And Vomiting  . Anaplex Hd Nausea And Vomiting  . Aspirin Nausea And Vomiting  . Azithromycin Nausea And Vomiting  . Calcium Nausea And Vomiting  . Ciprofloxacin     Patient states this caused nausea, dizziness and affected her muscles  . Codeine Nausea And Vomiting  . Dorzolamide Hcl-Timolol Mal Other (See Comments)  . Doxycycline Nausea And Vomiting  . Hydrocodone Nausea And Vomiting  . Ibandronate Sodium     REACTION: severe body aches  . Ibuprofen Nausea And Vomiting  . Penicillins Nausea And Vomiting  . Raloxifene Nausea And Vomiting  . Risedronate Sodium Nausea And Vomiting  . Sulfonamide Derivatives Nausea And Vomiting  . Teriparatide Nausea And Vomiting  . Travoprost    Current Meds  Medication Sig  . acetaminophen (TYLENOL) 325 MG tablet Take 650 mg by mouth every 6 (six) hours as needed for mild pain or headache.  . Ascorbic Acid (VITAMIN C PO) Take 500 mg by mouth 2 (two) times daily.  . B Complex-C (B-COMPLEX WITH VITAMIN C) tablet Take 1 tablet by  mouth daily.  . cholecalciferol (VITAMIN D) 1000 UNITS tablet Take 1,000 Units by mouth 2 (two) times daily.   . clindamycin (CLEOCIN) 300 MG capsule Take 1 capsule (300 mg total) by mouth in the morning and at bedtime.  . conjugated estrogens (PREMARIN) vaginal cream Place 1 Applicatorful vaginally daily.  [name] estradiol (ESTRACE) 0.5 MG tablet Take 0.5 mg by mouth daily.   [name] gabapentin (NEURONTIN) 100 MG capsule Take 1 capsule (100 mg total) by mouth at bedtime.  [name] levothyroxine (SYNTHROID) 50 MCG tablet Take 0.5 tablets (25 mcg total) by mouth daily.  . mupirocin ointment (BACTROBAN) 2 % Apply 1 application topically 2 (two) times daily.  [name] neomycin-colistin-hydrocortisone-thonzonium (CORTISPORIN-TC) 3.07-11-08-0.5 MG/ML OTIC suspension Place 3 drops into both ears 3 (three) times daily as needed (itching).  [name] OVER THE COUNTER MEDICATION Take 1 capsule by mouth 2 (two) times daily. [place]7 daily.  [name] OVER THE COUNTER MEDICATION 2 (two) times daily. Preservision vitamin for eyes  . Polyethyl Glycol-Propyl Glycol (SYSTANE OP) Apply to eye daily.  . vitamin E 400 UNIT capsule Take 1 capsule by mouth daily.    Review of Systems  Constitutional: Negative for chills, fatigue and fever.  Respiratory: Negative for cough, chest tightness, shortness of breath and wheezing.   Cardiovascular: Negative for chest pain, palpitations and leg swelling.  Skin: Positive for wound.  Neurological: Positive for dizziness (while on antibiotics).  Objective:  BP 118/60 (BP Location: Right Arm, Patient Position: Sitting, Cuff Size: Normal)   Pulse 76   Temp 97.9 F (36.6 C) (Oral)   Ht 5' (1.524 m)   Wt 112 lb 4.8 oz (50.9 kg)   SpO2 96%   BMI 21.93 kg/m   Weight: 112 lb 4.8 oz (50.9 kg)   BP Readings from Last 3 Encounters:  09/02/20 118/60  08/21/20 104/68  08/14/20 124/70   Wt Readings from Last 3 Encounters:  09/02/20 112 lb 4.8 oz (50.9 kg)  08/21/20 113 lb 8 oz (51.5 kg)   08/14/20 111 lb (50.3 kg)    Physical Exam Vitals reviewed.  Constitutional:      Appearance: Normal appearance.  Skin:    Comments: Significant improvement and wound healing right lower extremity.  Prior epibole has resolved.  There is still one area midline lateral to laceration that is slightly more erythematous and edematous.  She does not have significant tenderness over this area.  There is also some induration.  Small less than 1 cm area of swelling versus slight fluctuance.  Neurological:     Mental Status: She is alert.     Assessment/Plan  1. Laceration of right lower leg, subsequent encounter Wound looks tremendously better today.  She does not tolerate oral medications/antibiotics well at all, and I do not feel any further antibiotics are needed at this point.  I have asked her to carefully watch that midline area of the laceration to make sure not getting more red or swollen.  We discussed risk of ongoing infection/abscess and she understands she will need to seek care if any worsening at this point.  We discussed using just Vaseline to keep center of wound moist for good healing.  Elevate legs whenever sitting to help keep down any swelling.   Return if symptoms worsen or fail to improve.    Micheline Rough, MD

## 2020-09-19 ENCOUNTER — Encounter (INDEPENDENT_AMBULATORY_CARE_PROVIDER_SITE_OTHER): Payer: Medicare PPO | Admitting: Ophthalmology

## 2020-10-18 ENCOUNTER — Encounter: Payer: Self-pay | Admitting: Family Medicine

## 2020-10-18 ENCOUNTER — Other Ambulatory Visit: Payer: Self-pay

## 2020-10-18 ENCOUNTER — Ambulatory Visit: Payer: Medicare PPO | Admitting: Family Medicine

## 2020-10-18 VITALS — BP 110/60 | HR 63 | Temp 97.9°F | Wt 111.6 lb

## 2020-10-18 DIAGNOSIS — H6122 Impacted cerumen, left ear: Secondary | ICD-10-CM

## 2020-10-18 NOTE — Progress Notes (Signed)
   Subjective:    Patient ID: Heidi Moore, female    DOB: 1919/07/27, 87 y.o.   MRN: [id number]  HPI Here for 3 days of soreness in the left side of the throat and pain in the left ear. She wears hearing aids, but the hearing on the left side is even worse than usual this week. No fever or cough.    Review of Systems  Constitutional: Negative.   HENT:  Positive for ear pain, hearing loss and sore throat. Negative for congestion and postnasal drip.   Eyes: Negative.   Respiratory: Negative.    Cardiovascular: Negative.       Objective:   Physical Exam Constitutional:      Appearance: Normal appearance.  HENT:     Right Ear: Tympanic membrane, ear canal and external ear normal.     Left Ear: There is impacted cerumen.     Nose: Nose normal.     Mouth/Throat:     Pharynx: Oropharynx is clear. No oropharyngeal exudate or posterior oropharyngeal erythema.  Eyes:     Conjunctiva/sclera: Conjunctivae normal.  Cardiovascular:     Rate and Rhythm: Normal rate and regular rhythm.     Pulses: Normal pulses.     Heart sounds: Normal heart sounds.  Pulmonary:     Effort: Pulmonary effort is normal.     Breath sounds: Normal breath sounds.  Lymphadenopathy:     Cervical: No cervical adenopathy.  Neurological:     Mental Status: She is alert.          Assessment & Plan:  Cerumen impaction. We obtained informed consent to irrigate the ear with water. This was done successfully, and afterwards the canal looked clear and she could hear at her baseline again. She tolerated the procedure quite well.  Heidi Penna, MD

## 2020-11-19 ENCOUNTER — Encounter (INDEPENDENT_AMBULATORY_CARE_PROVIDER_SITE_OTHER): Payer: Medicare PPO | Admitting: Ophthalmology

## 2020-11-28 ENCOUNTER — Telehealth: Payer: Self-pay | Admitting: Family Medicine

## 2020-11-28 NOTE — Telephone Encounter (Signed)
Patient called on 11/28/20 stating she was having burning on her back for about two weeks and was inquiring about Shingles.  She wanted to come in instead of being seen virtual as planned for her 07/27 visit.  She was advised per Mechele Claude that if she was concerned about it being Shingles that she needed to come on in and be seen by one of the other providers (Dr. Ethlyn Gallery didn't have anything available).  Patient continued to decline an earlier appointment and said she would just come in on 07/27 to her visit.

## 2020-12-04 ENCOUNTER — Ambulatory Visit: Payer: Medicare PPO | Admitting: Family Medicine

## 2020-12-04 ENCOUNTER — Encounter: Payer: Self-pay | Admitting: Family Medicine

## 2020-12-04 ENCOUNTER — Other Ambulatory Visit: Payer: Self-pay

## 2020-12-04 VITALS — BP 120/70 | HR 82 | Temp 98.3°F | Ht 60.0 in | Wt 112.5 lb

## 2020-12-04 DIAGNOSIS — R252 Cramp and spasm: Secondary | ICD-10-CM

## 2020-12-04 DIAGNOSIS — M25552 Pain in left hip: Secondary | ICD-10-CM

## 2020-12-04 DIAGNOSIS — M2550 Pain in unspecified joint: Secondary | ICD-10-CM | POA: Diagnosis not present

## 2020-12-04 DIAGNOSIS — G629 Polyneuropathy, unspecified: Secondary | ICD-10-CM | POA: Diagnosis not present

## 2020-12-04 DIAGNOSIS — E538 Deficiency of other specified B group vitamins: Secondary | ICD-10-CM | POA: Diagnosis not present

## 2020-12-04 DIAGNOSIS — M810 Age-related osteoporosis without current pathological fracture: Secondary | ICD-10-CM

## 2020-12-04 DIAGNOSIS — E039 Hypothyroidism, unspecified: Secondary | ICD-10-CM

## 2020-12-04 LAB — CBC WITH DIFFERENTIAL/PLATELET
Basophils Absolute: 0.1 10*3/uL (ref 0.0–0.1)
Basophils Relative: 0.8 % (ref 0.0–3.0)
Eosinophils Absolute: 0.2 10*3/uL (ref 0.0–0.7)
Eosinophils Relative: 2.6 % (ref 0.0–5.0)
HCT: 40.5 % (ref 36.0–46.0)
Hemoglobin: 13.4 g/dL (ref 12.0–15.0)
Lymphocytes Relative: 18.4 % (ref 12.0–46.0)
Lymphs Abs: 1.2 10*3/uL (ref 0.7–4.0)
MCHC: 33 g/dL (ref 30.0–36.0)
MCV: 91.2 fl (ref 78.0–100.0)
Monocytes Absolute: 0.6 10*3/uL (ref 0.1–1.0)
Monocytes Relative: 8.8 % (ref 3.0–12.0)
Neutro Abs: 4.4 10*3/uL (ref 1.4–7.7)
Neutrophils Relative %: 69.4 % (ref 43.0–77.0)
Platelets: 183 10*3/uL (ref 150.0–400.0)
RBC: 4.44 Mil/uL (ref 3.87–5.11)
RDW: 14.3 % (ref 11.5–15.5)
WBC: 6.3 10*3/uL (ref 4.0–10.5)

## 2020-12-04 LAB — COMPREHENSIVE METABOLIC PANEL
ALT: 12 U/L (ref 0–35)
AST: 17 U/L (ref 0–37)
Albumin: 4.1 g/dL (ref 3.5–5.2)
Alkaline Phosphatase: 47 U/L (ref 39–117)
BUN: 15 mg/dL (ref 6–23)
CO2: 27 mEq/L (ref 19–32)
Calcium: 9.7 mg/dL (ref 8.4–10.5)
Chloride: 105 mEq/L (ref 96–112)
Creatinine, Ser: 0.92 mg/dL (ref 0.40–1.20)
GFR: 51.8 mL/min — ABNORMAL LOW (ref >60.00)
Glucose, Bld: 88 mg/dL (ref 70–99)
Potassium: 4 mEq/L (ref 3.5–5.1)
Sodium: 140 mEq/L (ref 135–145)
Total Bilirubin: 0.5 mg/dL (ref 0.2–1.2)
Total Protein: 6.9 g/dL (ref 6.0–8.3)

## 2020-12-04 LAB — SEDIMENTATION RATE: Sed Rate: 14 mm/hr (ref 0–30)

## 2020-12-04 LAB — VITAMIN D 25 HYDROXY (VIT D DEFICIENCY, FRACTURES): VITD: 31.79 ng/mL (ref 30.00–100.00)

## 2020-12-04 LAB — VITAMIN B12: Vitamin B-12: 522 pg/mL (ref 211–911)

## 2020-12-04 LAB — TSH: TSH: 6.82 u[IU]/mL — ABNORMAL HIGH (ref 0.35–5.50)

## 2020-12-04 NOTE — Patient Instructions (Signed)
*  consider trying the gabapentin at bedtime to help with nerve discomfort.

## 2020-12-04 NOTE — Progress Notes (Signed)
Heidi Moore DOB: 03-17-1920 Encounter date: 12/04/2020  This is a 87 y.o. female who presents with Chief Complaint  Patient presents with   Back Pain    Patient complains of low back pain x2 weeks or longer   Leg Swelling     History of present illness:  *wondering if she needs antifungal pill.  She still feels that many of her symptoms started after toenail removal years ago.  Family member recently had toenail removal and was treated with antifungal.  She worries that she could have a fungal infection in her system.  We discussed that patients with systemic fungal infections are generally extremely sick.  We discussed that side effects of antifungal medications are significant and that this would generally not help her condition.  *has been retaining fluid in legs. Has been elevating at night. Also getting some muscle cramping. Potasium has helped in the past. Stopped taking B12; not sure if that is related. Used to take whole potassium, but cut back after cardiology told her not to take this.  *left knee has been swelling; hx of replacement.   *seeing chiropractor q 2-3 weeks. Suggested hip and knee xray'd. She called ortho to set this up. Looked ok at that time. (She fell 3 years ago and landed on left hip; has bothered her since then.) states that every time she gets anti-viral shot she starts hurting everywhere. Worse after the booster shot.  Wondering how this is related.  Worries about getting future shots because of this.  *saw neurology - told she didn't have neuropathy. Wonders if this is correct.   *worried about shingles because she started burning sensation left upper shoulder.  No rash.  No symptoms today.   Allergies  Allergen Reactions   Alendronate Sodium Nausea And Vomiting   Anaplex Hd Nausea And Vomiting   Aspirin Nausea And Vomiting   Azithromycin Nausea And Vomiting   Calcium Nausea And Vomiting   Ciprofloxacin     Patient states this caused nausea,  dizziness and affected her muscles   Codeine Nausea And Vomiting   Dorzolamide Hcl-Timolol Mal Other (See Comments)   Doxycycline Nausea And Vomiting   Hydrocodone Nausea And Vomiting   Ibandronate Sodium     REACTION: severe body aches   Ibuprofen Nausea And Vomiting   Penicillins Nausea And Vomiting   Raloxifene Nausea And Vomiting   Risedronate Sodium Nausea And Vomiting   Sulfonamide Derivatives Nausea And Vomiting   Teriparatide Nausea And Vomiting   Travoprost    Current Meds  Medication Sig   acetaminophen (TYLENOL) 325 MG tablet Take 650 mg by mouth every 6 (six) hours as needed for mild pain or headache.   Ascorbic Acid (VITAMIN C PO) Take 500 mg by mouth 2 (two) times daily.   B Complex-C (B-COMPLEX WITH VITAMIN C) tablet Take 1 tablet by mouth daily.   cholecalciferol (VITAMIN D) 1000 UNITS tablet Take 1,000 Units by mouth 2 (two) times daily.    clindamycin (CLEOCIN) 300 MG capsule Take 1 capsule (300 mg total) by mouth in the morning and at bedtime.   conjugated estrogens (PREMARIN) vaginal cream Place 1 Applicatorful vaginally daily.   estradiol (ESTRACE) 0.5 MG tablet Take 0.5 mg by mouth daily.    gabapentin (NEURONTIN) 100 MG capsule Take 1 capsule (100 mg total) by mouth at bedtime.   levothyroxine (SYNTHROID) 50 MCG tablet Take 0.5 tablets (25 mcg total) by mouth daily.   mupirocin ointment (BACTROBAN) 2 % Apply 1 application  topically 2 (two) times daily.   neomycin-colistin-hydrocortisone-thonzonium (CORTISPORIN-TC) 3.07-11-08-0.5 MG/ML OTIC suspension Place 3 drops into both ears 3 (three) times daily as needed (itching).   OVER THE COUNTER MEDICATION Take 1 capsule by mouth 2 (two) times daily. [email or web address] daily.   OVER THE COUNTER MEDICATION 2 (two) times daily. Preservision vitamin for eyes   Polyethyl Glycol-Propyl Glycol (SYSTANE OP) Apply to eye daily.   vitamin E 400 UNIT capsule Take 1 capsule by mouth daily.    Review of Systems   Constitutional:  Positive for fatigue (energy is not what it used to be). Negative for chills and fever.  Respiratory:  Negative for cough, chest tightness, shortness of breath and wheezing.   Cardiovascular:  Positive for leg swelling. Negative for chest pain and palpitations.  Musculoskeletal:  Positive for arthralgias and back pain.  Neurological:  Positive for numbness.   Objective:  BP 120/70 (BP Location: Left Arm, Patient Position: Sitting, Cuff Size: Normal)   Pulse 82   Temp 98.3 F (36.8 C) (Oral)   Ht 5' (1.524 m)   Wt 112 lb 8 oz (51 kg)   SpO2 95%   BMI 21.97 kg/m   Weight: 112 lb 8 oz (51 kg)   BP Readings from Last 3 Encounters:  12/04/20 120/70  10/18/20 110/60  09/02/20 118/60   Wt Readings from Last 3 Encounters:  12/04/20 112 lb 8 oz (51 kg)  10/18/20 111 lb 9.6 oz (50.6 kg)  09/02/20 112 lb 4.8 oz (50.9 kg)    Physical Exam Constitutional:      General: She is not in acute distress.    Appearance: She is well-developed.  Cardiovascular:     Rate and Rhythm: Normal rate and regular rhythm.     Pulses:          Dorsalis pedis pulses are 2+ on the right side and 2+ on the left side.       Posterior tibial pulses are 2+ on the right side and 2+ on the left side.     Heart sounds: Normal heart sounds. No murmur heard.   No friction rub.  Pulmonary:     Effort: Pulmonary effort is normal. No respiratory distress.     Breath sounds: Normal breath sounds. No wheezing or rales.  Musculoskeletal:     Right lower leg: No edema.     Left lower leg: No edema.     Comments: Lumbar tenderness to palpation; some para lumbar spasm. Mild tenderness along lower rib cage to palpation.  Neurological:     Mental Status: She is alert and oriented to person, place, and time.  Psychiatric:        Behavior: Behavior normal.    Assessment/Plan  1. Neuropathy She never tried gabapentin due to worries of side effects.  We discussed that this may be very helpful for  helping with rest in the evening and helping with some of her nerve/leg symptoms. - Comprehensive metabolic panel; Future - Comprehensive metabolic panel  2. B12 deficiency  - Vitamin B12; Future - Homocysteine; Future - Methylmalonic acid, serum; Future - Methylmalonic acid, serum - Homocysteine - Vitamin B12  3. Pain of left hip joint Consider x-ray for further evaluation pending blood work.  4. Arthralgia, unspecified joint - CBC with Differential/Platelet; Future - Sedimentation rate; Future - Sedimentation rate - CBC with Differential/Platelet  5. Muscle cramps - Magnesium, RBC; Future - Magnesium, RBC  6. Osteoporosis, unspecified osteoporosis type, unspecified pathological fracture presence -  TSH; Future - VITAMIN D 25 Hydroxy (Vit-D Deficiency, Fractures); Future - VITAMIN D 25 Hydroxy (Vit-D Deficiency, Fractures) - TSH   Return for pending bloodwork.      Micheline Rough, MD

## 2020-12-10 ENCOUNTER — Other Ambulatory Visit: Payer: Self-pay

## 2020-12-10 ENCOUNTER — Ambulatory Visit (INDEPENDENT_AMBULATORY_CARE_PROVIDER_SITE_OTHER): Payer: Medicare PPO | Admitting: Ophthalmology

## 2020-12-10 ENCOUNTER — Encounter (INDEPENDENT_AMBULATORY_CARE_PROVIDER_SITE_OTHER): Payer: Self-pay | Admitting: Ophthalmology

## 2020-12-10 DIAGNOSIS — H401133 Primary open-angle glaucoma, bilateral, severe stage: Secondary | ICD-10-CM

## 2020-12-10 DIAGNOSIS — H31003 Unspecified chorioretinal scars, bilateral: Secondary | ICD-10-CM | POA: Diagnosis not present

## 2020-12-10 DIAGNOSIS — H4720 Unspecified optic atrophy: Secondary | ICD-10-CM | POA: Diagnosis not present

## 2020-12-10 DIAGNOSIS — H353132 Nonexudative age-related macular degeneration, bilateral, intermediate dry stage: Secondary | ICD-10-CM | POA: Diagnosis not present

## 2020-12-10 LAB — METHYLMALONIC ACID, SERUM: Methylmalonic Acid, Quant: 173 nmol/L (ref 87–318)

## 2020-12-10 LAB — HOMOCYSTEINE: Homocysteine: 9.6 umol/L (ref <10.4)

## 2020-12-10 LAB — MAGNESIUM, RBC

## 2020-12-10 NOTE — Assessment & Plan Note (Signed)
Follow-up with Dr. Marygrace Drought as scheduled.

## 2020-12-10 NOTE — Assessment & Plan Note (Signed)
No progression of age-related macular degeneration

## 2020-12-10 NOTE — Assessment & Plan Note (Signed)
Stable OU 

## 2020-12-10 NOTE — Progress Notes (Signed)
12/10/2020     CHIEF COMPLAINT Patient presents for Retina Follow Up (6 mos fu ou oct fp/Patient is worried that her Gabapentin medication is effecting her vision and states it is gradually worsening./)   HISTORY OF PRESENT ILLNESS: Heidi Moore is a 87 y.o. female who presents to the clinic today for:   HPI     Retina Follow Up           Diagnosis: Other   Laterality: both eyes   Onset: 6 months ago   Severity: mild   Duration: 6 months   Course: gradually worsening   Comments: 6 mos fu ou oct fp Patient is worried that her Gabapentin medication is effecting her vision and states it is gradually worsening.        Last edited by Laurin Coder, COA on 12/10/2020  1:44 PM.      Referring physician: Caren Macadam, MD [place],  [place] [address]  HISTORICAL INFORMATION:   Selected notes from the MEDICAL RECORD NUMBER    Lab Results  Component Value Date   HGBA1C 5.3 12/10/2015     CURRENT MEDICATIONS: Current Outpatient Medications (Ophthalmic Drugs)  Medication Sig   Polyethyl Glycol-Propyl Glycol (SYSTANE OP) Apply to eye daily.   No current facility-administered medications for this visit. (Ophthalmic Drugs)   Current Outpatient Medications (Other)  Medication Sig   acetaminophen (TYLENOL) 325 MG tablet Take 650 mg by mouth every 6 (six) hours as needed for mild pain or headache.   Ascorbic Acid (VITAMIN C PO) Take 500 mg by mouth 2 (two) times daily.   B Complex-C (B-COMPLEX WITH VITAMIN C) tablet Take 1 tablet by mouth daily.   cholecalciferol (VITAMIN D) 1000 UNITS tablet Take 1,000 Units by mouth 2 (two) times daily.    clindamycin (CLEOCIN) 300 MG capsule Take 1 capsule (300 mg total) by mouth in the morning and at bedtime.   conjugated estrogens (PREMARIN) vaginal cream Place 1 Applicatorful vaginally daily.   estradiol (ESTRACE) 0.5 MG tablet Take 0.5 mg by mouth daily.    gabapentin (NEURONTIN) 100 MG capsule Take 1  capsule (100 mg total) by mouth at bedtime.   levothyroxine (SYNTHROID) 50 MCG tablet Take 0.5 tablets (25 mcg total) by mouth daily.   mupirocin ointment (BACTROBAN) 2 % Apply 1 application topically 2 (two) times daily.   neomycin-colistin-hydrocortisone-thonzonium (CORTISPORIN-TC) 3.07-11-08-0.5 MG/ML OTIC suspension Place 3 drops into both ears 3 (three) times daily as needed (itching).   OVER THE COUNTER MEDICATION Take 1 capsule by mouth 2 (two) times daily. [place]7 daily.   OVER THE COUNTER MEDICATION 2 (two) times daily. Preservision vitamin for eyes   vitamin E 400 UNIT capsule Take 1 capsule by mouth daily.   No current facility-administered medications for this visit. (Other)      REVIEW OF SYSTEMS:    ALLERGIES Allergies  Allergen Reactions   Alendronate Sodium Nausea And Vomiting   Anaplex Hd Nausea And Vomiting   Aspirin Nausea And Vomiting   Azithromycin Nausea And Vomiting   Calcium Nausea And Vomiting   Ciprofloxacin     Patient states this caused nausea, dizziness and affected her muscles   Codeine Nausea And Vomiting   Dorzolamide Hcl-Timolol Mal Other (See Comments)   Doxycycline Nausea And Vomiting   Hydrocodone Nausea And Vomiting   Ibandronate Sodium     REACTION: severe body aches   Ibuprofen Nausea And Vomiting   Penicillins Nausea And  Vomiting   Raloxifene Nausea And Vomiting   Risedronate Sodium Nausea And Vomiting   Sulfonamide Derivatives Nausea And Vomiting   Teriparatide Nausea And Vomiting   Travoprost     PAST MEDICAL HISTORY Past Medical History:  Diagnosis Date   AC (acromioclavicular) joint bone spurs    on right shoulder    Allergic rhinitis    Arthritis    Bursitis    right shoulder   Complication of anesthesia    headaches   Diverticulosis    DIVERTICULOSIS, COLON 10/15/2006   Qualifier: Diagnosis of  By: Linna Darner MD, Lorretta Harp problem    treated by Dr Kathlen Mody per patient   [id number])    sinus HA    History of blood transfusion 58yr ago   no abnormal reaction noted   History of cystitis    History of skin cancer    Basal cell, Dr. FLindwood Coke  Hyperlipidemia    [place] Study LDL goal =<160    Hypotension    Joint pain    Joint swelling    Macular degeneration, dry    Osteopenia    Intolerance to Calcium,Actonel,Fosamax,Evista, Forteo: S/P Boniva 2007 to 03/2010   OSTEOPENIA 03/12/2008   Qualifier: Diagnosis of  By: HVelora Heckler    Osteoporosis    PONV (postoperative nausea and vomiting)    Rosacea 04/22/2009   Qualifier: Diagnosis of  By: HLinna DarnerMD, William     Skin cancer 06/22/2017   per patient after excision right calf by Dr LUbaldo Glassing  SKIN CANCER, HX OF 03/12/2008   Qualifier: Diagnosis of  By: HVelora Heckler    Urinary frequency    Vaginal atrophy    sees Dr. TGertie Fey  Past Surgical History:  Procedure Laterality Date   APPENDECTOMY     BUNIONECTOMY Bilateral    CATARACT EXTRACTION, BILATERAL     COLONOSCOPY     with Tics (no further f/u as per GI due to age)    JOINT REPLACEMENT Left    knee    KNEE ARTHROPLASTY  05/18/2012   Procedure: COMPUTER ASSISTED TOTAL KNEE ARTHROPLASTY;  Surgeon: MMarybelle Killings MD;  [facility]  Service: Orthopedics;  Laterality: Left;  Left Total Knee Arthroplasty, Cemented, Computer Assisted   lt knee      meniscus tear   MOHS SURGERY     right nostril for basel cell cancer    NOSE SURGERY     due to trama at age 87   TOTAL ABDOMINAL HYSTERECTOMY W/ BILATERAL SALPINGOOPHORECTOMY     for Endometriosis    TOTAL SHOULDER ARTHROPLASTY Right 09/11/2013   Procedure: TOTAL SHOULDER ARTHROPLASTY;  Surgeon: MMarybelle Killings MD;  [facility]  Service: Orthopedics;  Laterality: Right;  Right Total Shoulder Arthroplasty    FAMILY HISTORY Family History  Problem Relation Age of Onset   Colon cancer Other        1st Cousin    Stroke Mother 859  Diabetes Brother    Atrial fibrillation Brother        coumadin   Lung cancer  Sister     SOCIAL HISTORY Social History   Tobacco Use   Smoking status: Never   Smokeless tobacco: Never  Vaping Use   Vaping Use: Never used  Substance Use Topics   Alcohol use: No   Drug use: No         OPHTHALMIC EXAM:  Base Eye Exam  Visual Acuity (ETDRS)       Right Left   Dist [place] 20/80 20/80 -1   Dist ph [place] 20/60 -2 20/60 -2         Tonometry (Tonopen, 1:54 PM)       Right Left   Pressure 15 17         Pupils       Pupils Dark Light Shape React APD   Right PERRL 3 3 Round Minimal None   Left PERRL 3 2 Round Minimal None         Visual Fields (Counting fingers)       Left Right    Full Full         Extraocular Movement       Right Left    Full Full         Neuro/Psych     Oriented x3: Yes   Mood/Affect: Normal         Dilation     Both eyes: 1.0% Mydriacyl, 2.5% Phenylephrine @ 1:55 PM           Slit Lamp and Fundus Exam     External Exam       Right Left   External Normal Normal         Slit Lamp Exam       Right Left   Lids/Lashes Normal Normal   Conjunctiva/Sclera White and quiet White and quiet   Cornea Clear Clear   Anterior Chamber Deep and quiet Deep and quiet   Iris Round and reactive Round and reactive   Lens Centered posterior chamber intraocular lens, Open posterior capsule Centered posterior chamber intraocular lens   Anterior Vitreous Normal Normal         Fundus Exam       Right Left   Posterior Vitreous Posterior vitreous detachment Posterior vitreous detachment   Disc 1+ Optic disc atrophy, glaucomatous, thin rim temporally 1+ Optic disc atrophy, glaucomatous, thin rim temporally   C/D Ratio 0.95 0.9   Macula Hard drusen, Early age related macular degeneration Hard drusen, Early age related macular degeneration   Vessels Normal Normal   Periphery Mid peripheral chorioretinal scars and active Chorioretinal scar temporal and inferotemporal to the macula, inactive, pigmented edges,  4 disc areas in size.  Otherwise several other multiple punched-out chorioretinal scars each and active            IMAGING AND PROCEDURES  Imaging and Procedures for 12/10/20  OCT, Retina - OU - Both Eyes       Right Eye Quality was good. Scan locations included subfoveal. Central Foveal Thickness: 257. Progression has no prior data. Findings include no IRF, no SRF, abnormal foveal contour, retinal drusen .   Left Eye Quality was good. Scan locations included subfoveal. Central Foveal Thickness: 256. Progression has no prior data. Findings include abnormal foveal contour, retinal drusen , no SRF, no IRF.   Notes No active CN VM OU.  Will continue to monitor macular status. Diffuse retinal atrophy and RNFL atrophy likely coincident with temporal optic atrophy and thin rim to the papillomacular bundle.  I do wonder if this patient has Riemer optic atrophy and or low-tension glaucoma in conjunction with just the aging process that can develop with microvascular disease of the anterior optic nerve head.     Color Fundus Photography Optos - OU - Both Eyes       Right Eye Progression has no prior data. Disc findings include increased  cup to disc ratio, pallor, thinning of rim. Macula : drusen, geographic atrophy. Vessels : normal observations.   Left Eye Progression has no prior data. Disc findings include increased cup to disc ratio, pallor, thinning of rim. Macula : drusen, geographic atrophy. Vessels : normal observations.   Notes Peripheral chorioretinal scars and active, longstanding Advanced cupping noted OU.  Advanced atrophy in the papillomacular bundle temporal atrophy OU, thin rim.             ASSESSMENT/PLAN:  Scarring, chorioretinal, bilateral Stable OU  Intermediate stage nonexudative age-related macular degeneration of both eyes No progression of age-related macular degeneration  Optic atrophy of both eyes Follow-up with Dr. Marygrace Drought as  scheduled.     ICD-10-CM   1. Intermediate stage nonexudative age-related macular degeneration of both eyes  H35.3132 OCT, Retina - OU - Both Eyes    Color Fundus Photography Optos - OU - Both Eyes    2. Primary open angle glaucoma of both eyes, severe stage  H40.1133 OCT, Retina - OU - Both Eyes    Color Fundus Photography Optos - OU - Both Eyes    3. Scarring, chorioretinal, bilateral  H31.003     4. Optic atrophy of both eyes  H47.20       1.  OU with advanced optic atrophy.  2.  Mild ARMD OU.  3.  Inactive chorioretinal scars OU.   No active disease requiring retinal care at this time.  Follow-up exclusively Dr. Marygrace Drought  Ophthalmic Meds Ordered this visit:  No orders of the defined types were placed in this encounter.      Return if symptoms worsen or fail to improve.  There are no Patient Instructions on file for this visit.   Explained the diagnoses, plan, and follow up with the patient and they expressed understanding.  Patient expressed understanding of the importance of proper follow up care.   Clent Demark Siris Hoos [name] Diseases & Surgery of the Retina and Vitreous [facility] 12/10/20     Abbreviations: M myopia (nearsighted); A astigmatism; H hyperopia (farsighted); P presbyopia; Mrx spectacle prescription;  CTL contact lenses; OD right eye; OS left eye; OU both eyes  XT exotropia; ET esotropia; PEK punctate epithelial keratitis; PEE punctate epithelial erosions; DES dry eye syndrome; MGD meibomian gland dysfunction; ATs artificial tears; PFAT's preservative free artificial tears; [place] nuclear sclerotic cataract; PSC posterior subcapsular cataract; ERM epi-retinal membrane; PVD posterior vitreous detachment; RD retinal detachment; DM diabetes mellitus; DR diabetic retinopathy; NPDR non-proliferative diabetic retinopathy; PDR proliferative diabetic retinopathy; CSME clinically significant macular edema; DME diabetic macular edema; dbh dot blot  hemorrhages; CWS cotton wool spot; POAG primary open angle glaucoma; C/D cup-to-disc ratio; HVF humphrey visual field; GVF goldmann visual field; OCT optical coherence tomography; IOP intraocular pressure; BRVO Branch retinal vein occlusion; CRVO central retinal vein occlusion; CRAO central retinal artery occlusion; BRAO branch retinal artery occlusion; RT retinal tear; SB scleral buckle; PPV pars plana vitrectomy; VH Vitreous hemorrhage; PRP panretinal laser photocoagulation; IVK intravitreal kenalog; VMT vitreomacular traction; MH Macular hole;  NVD neovascularization of the disc; NVE neovascularization elsewhere; AREDS age related eye disease study; ARMD age related macular degeneration; POAG primary open angle glaucoma; EBMD epithelial/anterior basement membrane dystrophy; ACIOL anterior chamber intraocular lens; IOL intraocular lens; PCIOL posterior chamber intraocular lens; Phaco/IOL phacoemulsification with intraocular lens placement; [place] photorefractive keratectomy; LASIK laser assisted in situ keratomileusis; HTN hypertension; DM diabetes mellitus; COPD chronic obstructive pulmonary disease

## 2020-12-12 LAB — MAGNESIUM, RBC: Magnesium RBC: 5.3 mg/dL (ref 4.0–6.4)

## 2020-12-12 LAB — METHYLMALONIC ACID, SERUM

## 2020-12-12 LAB — HOMOCYSTEINE

## 2020-12-17 ENCOUNTER — Telehealth: Payer: Self-pay | Admitting: Family Medicine

## 2020-12-17 ENCOUNTER — Ambulatory Visit: Payer: Medicare PPO | Admitting: Orthopaedic Surgery

## 2020-12-17 NOTE — Telephone Encounter (Signed)
Patient called stating that she was told that if she is having any side effects from her gabapentin (NEURONTIN) 100 MG capsule and she said that she is having the following side effects:  Dizziness Blurred vision Problems with balance Muscle and joint pain Changes in eye sight  She also says that she stopped the medication for two days and started to have bad headaches towards the back of her head.  She says that she was told to take two capsules but she cannot take two capsules with the side effects she is having.  Patient says that she would like this information to be placed in her chart so her other doctors will know not to put her on this medication.  Please advise.

## 2020-12-18 NOTE — Telephone Encounter (Signed)
Pt informed of the message and verbalized understanding  

## 2020-12-18 NOTE — Telephone Encounter (Signed)
Gabapentin added to allergy list.

## 2020-12-18 NOTE — Telephone Encounter (Signed)
Ok. Please add to allergy list. I do not really have another medication to recommend for nerve pain in legs. May do better with topical treatment rather than oral medication due to senstivity to meds. Could consider "restless legs" cream topical - in evening. "[employer]" makes one that I have had good feedback about. Can get at pharmacy or [facility].

## 2020-12-25 NOTE — Addendum Note (Signed)
Addended by: Agnes Lawrence on: 12/25/2020 04:27 PM   Modules accepted: Orders

## 2020-12-27 ENCOUNTER — Encounter: Payer: Self-pay | Admitting: Orthopaedic Surgery

## 2020-12-27 ENCOUNTER — Ambulatory Visit: Payer: Medicare PPO | Admitting: Orthopaedic Surgery

## 2020-12-27 ENCOUNTER — Ambulatory Visit (INDEPENDENT_AMBULATORY_CARE_PROVIDER_SITE_OTHER): Payer: Medicare PPO

## 2020-12-27 ENCOUNTER — Other Ambulatory Visit: Payer: Self-pay

## 2020-12-27 VITALS — BP 126/70 | HR 75 | Ht 60.0 in | Wt 112.6 lb

## 2020-12-27 DIAGNOSIS — G8929 Other chronic pain: Secondary | ICD-10-CM

## 2020-12-27 DIAGNOSIS — M25552 Pain in left hip: Secondary | ICD-10-CM

## 2020-12-27 DIAGNOSIS — M25562 Pain in left knee: Secondary | ICD-10-CM

## 2020-12-27 NOTE — Progress Notes (Signed)
Office Visit Note   Patient: Heidi Moore           Date of Birth: [birth year]           MRN: [id number] Visit Date: 12/27/2020              Requested by: Caren Macadam, MD [place],  [place] [address] PCP: Caren Macadam, MD   Assessment & Plan: Visit Diagnoses:  1. Pain in left hip   2. Chronic pain of left knee     Plan: Would recommend she try wearing some TED hose to help with the swelling in the lower extremities that progressed during the day.  She also can intermittently elevate her leg.  She can follow-up if she has change in symptoms.  Follow-Up Instructions: No follow-ups on file.   Orders:  Orders Placed This Encounter  Procedures   XR HIP UNILAT W OR W/O PELVIS 2-3 VIEWS LEFT   XR Knee 1-2 Views Left   No orders of the defined types were placed in this encounter.     Procedures: No procedures performed   Clinical Data: No additional findings.   Subjective: Chief Complaint  Patient presents with   Left Knee - Pain   Left Hip - Pain    HPI 87 year old female returns she states she fell 3 years ago in July had x-rays at that time and sometimes feels like her left knee may be swollen.  She feels like sometimes the leg distally feels more warm.  Recent bone density showed some decreased bone density.  She states she was checked for neuropathy and states is negative.  She is use some Aspercreme on her leg and Tylenol.  She continues to walk with a cane.  Previous left total knee arthroplasty 2014.  Patient thinks her symptoms are worse by the end of the day.  Review of Systems negative for claudication symptoms.  Negative for falls.  All other systems noncontributory to HPI 14 point system update.   Objective: Vital Signs: BP 126/70   Pulse 75   Ht 5' (1.524 m)   Wt 112 lb 9.6 oz (51.1 kg)   BMI 21.99 kg/m   Physical Exam Constitutional:      Appearance: She is well-developed.  HENT:     Head: Normocephalic.      Right Ear: External ear normal.     Left Ear: External ear normal. There is no impacted cerumen.  Eyes:     Pupils: Pupils are equal, round, and reactive to light.  Neck:     Thyroid: No thyromegaly.     Trachea: No tracheal deviation.  Cardiovascular:     Rate and Rhythm: Normal rate.  Pulmonary:     Effort: Pulmonary effort is normal.  Abdominal:     Palpations: Abdomen is soft.  Musculoskeletal:     Cervical back: No rigidity.  Skin:    General: Skin is warm and dry.  Neurological:     Mental Status: She is alert and oriented to person, place, and time.  Psychiatric:        Behavior: Behavior normal.    Ortho Exam no pitting edema she is amatory with a cane.  Negative logroll of the hips knees reach full extension collateral ligaments are stable.  No increased warmth.  She has trace edema both lower extremities.  Knee range of motion shows full extension flexion to 120.  Collateral ligaments are balanced.  Specialty Comments:  No  specialty comments available.  Imaging: No results found.   PMFS History: Patient Active Problem List   Diagnosis Date Noted   Optic atrophy of both eyes 12/10/2020   Intermediate stage nonexudative age-related macular degeneration of both eyes 05/21/2020   Primary open angle glaucoma of both eyes, severe stage 05/21/2020   Scarring, chorioretinal, 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 Brandy Hale 08/24/2012   Hot flashes - followed by gyn, Dr. Gertie Fey 08/24/2012   Osteoarthritis of left knee 05/17/2012    Class: Diagnosis of   LOW BACK PAIN SYNDROME 05/07/2010   VITAMIN D DEFICIENCY 04/22/2009   Allergic rhinitis 03/12/2008   Hyperlipemia 10/22/2006   Past Medical History:  Diagnosis Date   AC (acromioclavicular) joint bone spurs    on right shoulder    Allergic  rhinitis    Arthritis    Bursitis    right shoulder   Complication of anesthesia    headaches   Diverticulosis    DIVERTICULOSIS, COLON 10/15/2006   Qualifier: Diagnosis of  By: Linna Darner MD, Lorretta Harp problem    treated by Dr Kathlen Mody per patient   [id number])    sinus HA   History of blood transfusion 30yr ago   no abnormal reaction noted   History of cystitis    History of skin cancer    Basal cell, Dr. FLindwood Coke  Hyperlipidemia    [place] Study LDL goal =<160    Hypotension    Joint pain    Joint swelling    Macular degeneration, dry    Osteopenia    Intolerance to Calcium,Actonel,Fosamax,Evista, Forteo: S/P Boniva 2007 to 03/2010   OSTEOPENIA 03/12/2008   Qualifier: Diagnosis of  By: HVelora Heckler    Osteoporosis    PONV (postoperative nausea and vomiting)    Rosacea 04/22/2009   Qualifier: Diagnosis of  By: HLinna DarnerMD, William     Skin cancer 06/22/2017   per patient after excision right calf by Dr LUbaldo Glassing  SKIN CANCER, HX OF 03/12/2008   Qualifier: Diagnosis of  By: HVelora Heckler    Urinary frequency    Vaginal atrophy    sees Dr. TGertie Fey   Family History  Problem Relation Age of Onset   Colon cancer Other        89stCousin    Stroke Mother 855  Diabetes Brother    Atrial fibrillation Brother        coumadin   Lung cancer Sister     Past Surgical History:  Procedure Laterality Date   APPENDECTOMY     BUNIONECTOMY Bilateral    CATARACT EXTRACTION, BILATERAL     COLONOSCOPY     with Tics (no further f/u as per GI due to age)    JOINT REPLACEMENT Left    knee    KNEE ARTHROPLASTY  05/18/2012   Procedure: COMPUTER ASSISTED TOTAL KNEE ARTHROPLASTY;  Surgeon: MMarybelle Killings MD;  [facility]  Service: Orthopedics;  Laterality: Left;  Left Total Knee Arthroplasty, Cemented, Computer Assisted   lt knee      meniscus tear   MOHS SURGERY     right nostril for basel cell cancer    NOSE SURGERY     due to trama at age 87   TOTAL  ABDOMINAL  HYSTERECTOMY W/ BILATERAL SALPINGOOPHORECTOMY     for Endometriosis    TOTAL SHOULDER ARTHROPLASTY Right 09/11/2013   Procedure: TOTAL SHOULDER ARTHROPLASTY;  Surgeon: Marybelle Killings, MD;  Location: [facility];  Service: Orthopedics;  Laterality: Right;  Right Total Shoulder Arthroplasty   Social History   Occupational History    Employer: RETIRED  Tobacco Use   Smoking status: Never   Smokeless tobacco: Never  Vaping Use   Vaping Use: Never used  Substance and Sexual Activity   Alcohol use: No   Drug use: No   Sexual activity: Not on file

## 2021-01-15 ENCOUNTER — Telehealth: Payer: Self-pay | Admitting: Family Medicine

## 2021-01-15 NOTE — Telephone Encounter (Signed)
Patient called because Mcarthur Rossetti is wanting to know why patient is still on estradiol (ESTRACE) 0.5 MG tablet. Patient states she spoke with in home health Nurse and Nurse said to tell our office about this so Dr.Koberlein knows in case [employer] wants anything. When patient stops the estradiol she gets bad depression and anxiety, does not want to stop taking it.        Good callback number [phone]    Please Advise

## 2021-01-15 NOTE — Telephone Encounter (Signed)
Left message for patient to call back and schedule Medicare Annual Wellness Visit (AWV) either virtually or in office. Left  my Heidi Moore number [phone]   Last AWV 03/06/20  please schedule at anytime with [facility] Nurse Health Advisor 1 or 2   This should be a 45 minute visit.

## 2021-01-15 NOTE — Telephone Encounter (Signed)
Declined  insurance come to home 01/19/21

## 2021-01-17 NOTE — Telephone Encounter (Signed)
Noted  

## 2021-01-20 ENCOUNTER — Telehealth: Payer: Self-pay | Admitting: Family Medicine

## 2021-01-20 NOTE — Telephone Encounter (Signed)
Home number busy x2.

## 2021-01-20 NOTE — Telephone Encounter (Signed)
Patient called stating that she has been having dizzy spells and that she fell on Friday. She says that she was visited by a nurse from [facility] and the nurse stated that it could be due to inner ear issues.  She says that she is scheduled to see a specialist but they will not be able to see her before October 21st.   She is wanting to speak to Wendie Simmer or Dr. Ethlyn Gallery about referring her to a specialist that could see her before October 21st to help her out with her inner ear issues.  Patient said that she could be reached at home at [phone].  Please advise.

## 2021-01-21 NOTE — Telephone Encounter (Signed)
Patient called back and was informed a visit is needed for evaluation by PCP prior to a referral being placed.  Appt scheduled for 9/14.

## 2021-01-21 NOTE — Telephone Encounter (Signed)
Left a message for the patient to return my call.  

## 2021-01-22 ENCOUNTER — Ambulatory Visit: Payer: Medicare PPO | Admitting: Family Medicine

## 2021-01-22 ENCOUNTER — Encounter: Payer: Self-pay | Admitting: Family Medicine

## 2021-01-22 ENCOUNTER — Other Ambulatory Visit: Payer: Self-pay

## 2021-01-22 VITALS — BP 130/62 | HR 79 | Temp 98.2°F | Ht 60.0 in | Wt 113.0 lb

## 2021-01-22 DIAGNOSIS — H6981 Other specified disorders of Eustachian tube, right ear: Secondary | ICD-10-CM

## 2021-01-22 DIAGNOSIS — J3089 Other allergic rhinitis: Secondary | ICD-10-CM | POA: Diagnosis not present

## 2021-01-22 MED ORDER — FLUTICASONE PROPIONATE 50 MCG/ACT NA SUSP: 2.0000 | Freq: Every day | NASAL | 6 refills | Status: DC

## 2021-01-22 NOTE — Progress Notes (Signed)
Heidi Moore DOB: Oct 20, 1919 Encounter date: 01/22/2021  This is a 87 y.o. female who presents with Chief Complaint  Patient presents with   Fall    Patient states she fell on September 9th, states she was getting out the car, reached to get her cane, "felt strong sensation of dizziness hit her" and she fell onto the ground, hit right shoulder and complains of recurrent dizziness since    History of present illness: Had seen ent through [facility] for ear - right ear was trouble in past and she had exercises to do for this.   Was walking outside last week - on Friday went to gym with daughter. Had piece of cheese toast, orange, took vitamins. She was feeling her usual off balance - sort of dizziness. Put cane to side and shut door and got wave of dizziness that was really strong and fell down and hit right side, mostly on hip. Employee came out and picked her right up. She walked in with cane. (She always thinks eyes play a roll but last time she was there eyes were stable - visit in august). They had her sit and drink water, ate protein bar. Still slightly dizzy. No disoriented. Just was nervous. She has started back with head and neck exercises that she was previously given for dizziness. She saw [facility]. Worries that she has sinus infection. Nose always running, but it is thin. Hasn't felt more dizzy since fall; but states that she has had some dizziness that has been a little worse in the last couple of months. Dizzy spell above and fall was after walking outside so they both thought it was allergy related. She describes dizziness as eyes not focusing well.   Spot back of right shoulder. Not sure that her head hit anything.   Has headache on top of head, back of head. Dull ache.   Has had sinus issues since she was a kid and had traumatic fall on face on the ice.   She did try to get off hormones on more than one occasion, but got shaky,  emotions affected. [employer] was questioning gyn about prescribing.   Started using antibiotic/steroid drops in right ear over weekend. Ears itch, head itches. Last night took some allegra. She has hard time with ragweed in the fall.   Allergies  Allergen Reactions   Alendronate Sodium Nausea And Vomiting   Anaplex Hd Nausea And Vomiting   Aspirin Nausea And Vomiting   Azithromycin Nausea And Vomiting   Calcium Nausea And Vomiting   Ciprofloxacin     Patient states this caused nausea, dizziness and affected her muscles   Codeine Nausea And Vomiting   Dorzolamide Hcl-Timolol Mal Other (See Comments)   Doxycycline Nausea And Vomiting   Gabapentin Other (See Comments)    Dizziness, Joint pain, blurred vision, changes in eyesight   Hydrocodone Nausea And Vomiting   Ibandronate Sodium     REACTION: severe body aches   Ibuprofen Nausea And Vomiting   Penicillins Nausea And Vomiting   Raloxifene Nausea And Vomiting   Risedronate Sodium Nausea And Vomiting   Sulfonamide Derivatives Nausea And Vomiting   Teriparatide Nausea And Vomiting   Travoprost    Current Meds  Medication Sig   acetaminophen (TYLENOL) 325 MG tablet Take 650 mg by mouth every 6 (six) hours as needed for mild pain or headache.   Ascorbic Acid (VITAMIN C PO) Take 500 mg by mouth 2 (two) times  daily.   B Complex-C (B-COMPLEX WITH VITAMIN C) tablet Take 1 tablet by mouth daily.   cholecalciferol (VITAMIN D) 1000 UNITS tablet Take 1,000 Units by mouth 2 (two) times daily.    clindamycin (CLEOCIN) 300 MG capsule Take 1 capsule (300 mg total) by mouth in the morning and at bedtime.   conjugated estrogens (PREMARIN) vaginal cream Place 1 Applicatorful vaginally daily.   estradiol (ESTRACE) 0.5 MG tablet Take 0.5 mg by mouth daily.    fluticasone (FLONASE) 50 MCG/ACT nasal spray Place 2 sprays into both nostrils daily.   gabapentin (NEURONTIN) 100 MG capsule Take 1 capsule (100 mg total) by mouth at bedtime.   levothyroxine  (SYNTHROID) 50 MCG tablet Take 0.5 tablets (25 mcg total) by mouth daily.   mupirocin ointment (BACTROBAN) 2 % Apply 1 application topically 2 (two) times daily.   neomycin-colistin-hydrocortisone-thonzonium (CORTISPORIN-TC) 3.07-11-08-0.5 MG/ML OTIC suspension Place 3 drops into both ears 3 (three) times daily as needed (itching).   OVER THE COUNTER MEDICATION Take 1 capsule by mouth 2 (two) times daily. [place]7 daily.   OVER THE COUNTER MEDICATION 2 (two) times daily. Preservision vitamin for eyes   Polyethyl Glycol-Propyl Glycol (SYSTANE OP) Apply to eye daily.   vitamin E 400 UNIT capsule Take 1 capsule by mouth daily.    Review of Systems  Constitutional:  Negative for chills, fatigue and fever.  HENT:  Positive for congestion, ear pain (right side, pressure intermittent) and hearing loss (chronic). Negative for ear discharge, sinus pressure, sinus pain and sore throat.   Respiratory:  Negative for cough, chest tightness, shortness of breath and wheezing.   Cardiovascular:  Negative for chest pain, palpitations and leg swelling.  Neurological:  Positive for dizziness (see hpi).   Objective:  BP 130/62 (BP Location: Left Arm, Patient Position: Sitting, Cuff Size: Normal)   Pulse 79   Temp 98.2 F (36.8 C) (Oral)   Ht 5' (1.524 m)   Wt 113 lb (51.3 kg)   SpO2 97%   BMI 22.07 kg/m   Weight: 113 lb (51.3 kg)   BP Readings from Last 3 Encounters:  01/22/21 130/62  12/27/20 126/70  12/04/20 120/70   Wt Readings from Last 3 Encounters:  01/22/21 113 lb (51.3 kg)  12/27/20 112 lb 9.6 oz (51.1 kg)  12/04/20 112 lb 8 oz (51 kg)    Physical Exam Constitutional:      General: She is not in acute distress.    Appearance: She is well-developed.  HENT:     Head: Normocephalic and atraumatic.     Right Ear: Tympanic membrane, ear canal and external ear normal.     Left Ear: Tympanic membrane, ear canal and external ear normal.     Nose: Mucosal edema present.      Right Sinus: Maxillary sinus tenderness and frontal sinus tenderness present.     Left Sinus: Maxillary sinus tenderness and frontal sinus tenderness present.     Mouth/Throat:     Pharynx: Uvula midline. Posterior oropharyngeal erythema present.  Cardiovascular:     Rate and Rhythm: Normal rate and regular rhythm.  Pulmonary:     Effort: Pulmonary effort is normal. No respiratory distress.     Breath sounds: Normal breath sounds. No wheezing, rhonchi or rales.    Assessment/Plan  1. Allergic rhinitis due to other allergic trigger, unspecified seasonality Continue with antihistamine (Allegra or Zyrtec) and add Flonase.  We discussed that Flonase is most helpful for eustachian tube dysfunction.  This  had been discussed with her at prior ENT visit, but she did not recall taking the nasal spray.  We discussed potential side effects of the Flonase including drying of mucous membranes.  Suggested using Vaseline to help with moisturizing inside the nose if dryness occurs.  We discussed that the Flonase may also help with her continued nasal drip.  Could consider Atrovent nasal spray if this is not working.  If any worsening of symptoms encouraged her to let me know.  She does maintain some seasonal allergies and sinus congestion and if having an increase in symptoms we could consider treatment for sinusitis.  2. Eustachian tube dysfunction, right See above.    Return if symptoms worsen or fail to improve. 35 minutes spent in review of current symptoms, exam, discussion of treatment options and proper technique for nasal spray use. Reviewed prior notes from ENT, charting.    Micheline Rough, MD

## 2021-01-22 NOTE — Patient Instructions (Signed)
Trial of flonase to help with ear tube (eustachian tube) discomfort. Continue with allergy med (allegra or zyrtec). Let me know if any worsening of sinus symptoms.

## 2021-01-24 ENCOUNTER — Telehealth: Payer: Self-pay

## 2021-01-24 NOTE — Telephone Encounter (Signed)
Patient called stating that fluticasone (FLONASE) 50 MCG/ACT nasal spray is causing dryness to her eyes and discomfort pt would like a call back to discuss.

## 2021-01-27 NOTE — Telephone Encounter (Signed)
*  all nasal sprays are going to come with risk of dryness. An antihistamine nasal spray may be less severe (she could try that just otc), or just stick with the daily antihistamine pill. Those are really the best things to help with opening up the eustachian tube (causing ear pressure). If she cannot tolerate those; she could follow up with ent (I know she can't tolerate prednisone which is other med we use to help with opening up that tube)

## 2021-01-27 NOTE — Telephone Encounter (Signed)
Left a message for the patient to return my call.  

## 2021-01-28 NOTE — Telephone Encounter (Signed)
Patient informed of the message below.

## 2021-01-28 NOTE — Telephone Encounter (Signed)
Left a message for the patient to return my call.  

## 2021-02-25 ENCOUNTER — Telehealth: Payer: Self-pay | Admitting: Family Medicine

## 2021-02-26 NOTE — Telephone Encounter (Signed)
Patieint called to get refill on Neomycin-Polymyxin-HC 3.5-10000-1 I stated patient needed an appointment and she has scheduled one for 10/24      Please Send to  [facility] [address] [place] [address] Phone:  [phone]  Fax:  [phone]        Please Advise

## 2021-02-28 ENCOUNTER — Other Ambulatory Visit: Payer: Self-pay | Admitting: Family Medicine

## 2021-02-28 MED ORDER — CORTISPORIN-TC 3.3-3-10-0.5 MG/ML OT SUSP: 3.0000 [drp] | Freq: Three times a day (TID) | OTIC | 0 refills | Status: DC | PRN

## 2021-02-28 NOTE — Telephone Encounter (Signed)
Spoke with the patient's daughter and informed her of the message below.  She stated the patient only had the appt for the drops, is aware the Rx was sent in and the appt was cancelled for 10/24.

## 2021-02-28 NOTE — Telephone Encounter (Signed)
No answer at the patient's home number. 

## 2021-02-28 NOTE — Telephone Encounter (Signed)
I went ahead and sent refill. She doesn't need appointment.

## 2021-03-03 ENCOUNTER — Telehealth: Payer: Medicare PPO | Admitting: Family Medicine

## 2021-03-04 ENCOUNTER — Other Ambulatory Visit: Payer: Self-pay | Admitting: Family Medicine

## 2021-03-07 ENCOUNTER — Telehealth: Payer: Self-pay | Admitting: Family Medicine

## 2021-03-07 ENCOUNTER — Telehealth: Payer: Self-pay

## 2021-03-07 NOTE — Telephone Encounter (Signed)
Yes that's fine 

## 2021-03-07 NOTE — Telephone Encounter (Signed)
Heather pharm with [facility] is calling the patient insurance does not cover cortisporin TC and heather is requesting cortisporin without TC [place], [place] [address] [phone]

## 2021-03-07 NOTE — Telephone Encounter (Signed)
It is ok; I do worry about her getting new immunizations since she seems to react more strongly, but it is a good immunization.   If she hasn't had a flu shot or pneumonia shot yet this year (prevnar 20) I would recommend these before getting the shingles vaccine. Advise that with shingles vaccine people can feel run down, feverish, fatigue; usually better within 24 hours and we do tend to see more local reaction on arm from shingles vaccine than we do with some other immunizations.

## 2021-03-07 NOTE — Telephone Encounter (Signed)
Patient called asking if it is ok to get a shingles vaccine? Please advise

## 2021-03-10 NOTE — Telephone Encounter (Signed)
Left a detailed message with the information below on the pharmacies voicemail.

## 2021-03-11 NOTE — Telephone Encounter (Signed)
Left a message for the patient to return my call.  

## 2021-03-12 NOTE — Telephone Encounter (Signed)
Spoke with the patient and informed her of the message below.  Patient stated she had her flu shot in October and would prefer to get the Prevnar 20 at [facility] since she can just walk in there.  Patient agreed to call back for a nurrse's visit if the an appt is needed here.

## 2021-03-18 ENCOUNTER — Encounter: Payer: Medicare PPO | Admitting: Physical Therapy

## 2021-03-18 ENCOUNTER — Telehealth: Payer: Self-pay

## 2021-03-18 DIAGNOSIS — R739 Hyperglycemia, unspecified: Secondary | ICD-10-CM

## 2021-03-18 DIAGNOSIS — Z23 Encounter for immunization: Secondary | ICD-10-CM

## 2021-03-18 NOTE — Telephone Encounter (Signed)
Patient called asking if Dr. Ethlyn Gallery will send Rx to [facility] for  Pneumonia  & Shingles vaccine patient also stated that she is craving sweet and would blood glucose checked during lab visit on 11/18

## 2021-03-19 NOTE — Telephone Encounter (Signed)
Patient informed the Rxs were sent to [facility] for the vaccines as below.  Lab order entered and the patient was informed to let the lab tech know additional tests were added on 11/18.

## 2021-03-19 NOTE — Telephone Encounter (Signed)
Patient stated she was not satisfied with the PA at [facility] and would rather see a MD within [facility].  I advised the patient to contact their office to request an appt with a different provider as [facility] does not have any ENTs.

## 2021-03-19 NOTE — Telephone Encounter (Signed)
Ok to send rx for Shingrix vaccine and Prevnar 20 to the pharmacy for her.  Okay to add on A1c for diagnosis of hyperglycemia to blood work order

## 2021-03-19 NOTE — Addendum Note (Signed)
Addended by: Agnes Lawrence on: 03/19/2021 11:26 AM   Modules accepted: Orders

## 2021-03-24 ENCOUNTER — Telehealth: Payer: Self-pay

## 2021-03-24 NOTE — Telephone Encounter (Signed)
Patient called and stated pharmacy does not have Rx for Shingrix vaccine and Prevnar 20 and would like Cma to call the pharmacy

## 2021-03-24 NOTE — Telephone Encounter (Signed)
Spoke with Heidi Moore at [facility] and he stated a prescription is not needed for either vaccine; however the Rx was received for the shingles vaccine only and this was given to the patient on 11/12.  I spoke with the patient and informed her of this information.  Patient stated the pharmacist would not give her a pneumonia shot as she was not sure which one she needed.  Patient was advised and agreed to go to the pharmacy and ask for a Prevnar 20.

## 2021-03-26 DIAGNOSIS — M9903 Segmental and somatic dysfunction of lumbar region: Secondary | ICD-10-CM | POA: Diagnosis not present

## 2021-03-26 DIAGNOSIS — M9905 Segmental and somatic dysfunction of pelvic region: Secondary | ICD-10-CM | POA: Diagnosis not present

## 2021-03-26 DIAGNOSIS — M9904 Segmental and somatic dysfunction of sacral region: Secondary | ICD-10-CM | POA: Diagnosis not present

## 2021-03-26 DIAGNOSIS — M5136 Other intervertebral disc degeneration, lumbar region: Secondary | ICD-10-CM | POA: Diagnosis not present

## 2021-03-28 ENCOUNTER — Other Ambulatory Visit: Payer: Self-pay

## 2021-03-28 ENCOUNTER — Ambulatory Visit: Payer: Medicare PPO | Attending: Physician Assistant

## 2021-03-28 ENCOUNTER — Other Ambulatory Visit (INDEPENDENT_AMBULATORY_CARE_PROVIDER_SITE_OTHER): Payer: Medicare PPO

## 2021-03-28 DIAGNOSIS — R2681 Unsteadiness on feet: Secondary | ICD-10-CM | POA: Insufficient documentation

## 2021-03-28 DIAGNOSIS — R42 Dizziness and giddiness: Secondary | ICD-10-CM | POA: Insufficient documentation

## 2021-03-28 DIAGNOSIS — E039 Hypothyroidism, unspecified: Secondary | ICD-10-CM

## 2021-03-28 DIAGNOSIS — M6281 Muscle weakness (generalized): Secondary | ICD-10-CM | POA: Diagnosis not present

## 2021-03-28 DIAGNOSIS — R739 Hyperglycemia, unspecified: Secondary | ICD-10-CM

## 2021-03-28 NOTE — Addendum Note (Signed)
Addended by: Rosalyn Gess D on: 03/28/2021 02:19 PM   Modules accepted: Orders

## 2021-03-28 NOTE — Therapy (Signed)
[facility] [address] [place], [place], [address] Phone: [phone]   Fax:  [phone]  Physical Therapy Evaluation  Patient Details  Name: Heidi Moore MRN: [id number] Date of Birth: 01-30-1920 Referring Provider (PT): Jolene Provost, [place]   Encounter Date: 03/28/2021   PT End of Session - 03/28/21 1256     Visit Number 1    Number of Visits 5    Date for PT Re-Evaluation 04/25/21    Authorization Type [employer]    Authorization Time Period Awaiting Authorization    Progress Note Due on Visit 10    PT Start Time 1146    PT Stop Time 1231    PT Time Calculation (min) 45 min    Activity Tolerance Patient tolerated treatment well    Behavior During Therapy [facility] for tasks assessed/performed             Past Medical History:  Diagnosis Date   AC (acromioclavicular) joint bone spurs    on right shoulder    Allergic rhinitis    Arthritis    Bursitis    right shoulder   Complication of anesthesia    headaches   Diverticulosis    DIVERTICULOSIS, COLON 10/15/2006   Qualifier: Diagnosis of  By: Linna Darner MD, Lorretta Harp problem    treated by Dr Kathlen Mody per patient   XVQMGQQP(619.5)    sinus HA   History of blood transfusion 32yrs ago   no abnormal reaction noted   History of cystitis    History of skin cancer    Basal cell, Dr. Lindwood Coke   Hyperlipidemia    Framingham Study LDL goal =<160    Hypotension    Joint pain    Joint swelling    Macular degeneration, dry    Osteopenia    Intolerance to Calcium,Actonel,Fosamax,Evista, Forteo: S/P Boniva 2007 to 03/2010   OSTEOPENIA 03/12/2008   Qualifier: Diagnosis of  By: Velora Heckler     Osteoporosis    PONV (postoperative nausea and vomiting)    Rosacea 04/22/2009   Qualifier: Diagnosis of  By: Linna Darner MD, [place]     Skin cancer 06/22/2017   per patient after excision right calf by Dr Ubaldo Glassing   SKIN CANCER, HX OF 03/12/2008    Qualifier: Diagnosis of  By: Velora Heckler     Urinary frequency    Vaginal atrophy    sees Dr. Gertie Fey    Past Surgical History:  Procedure Laterality Date   APPENDECTOMY     BUNIONECTOMY Bilateral    CATARACT EXTRACTION, BILATERAL     COLONOSCOPY     with Tics (no further f/u as per GI due to age)    JOINT REPLACEMENT Left    knee    KNEE ARTHROPLASTY  05/18/2012   Procedure: COMPUTER ASSISTED TOTAL KNEE ARTHROPLASTY;  Surgeon: Marybelle Killings, MD;  Location: [facility];  Service: Orthopedics;  Laterality: Left;  Left Total Knee Arthroplasty, Cemented, Computer Assisted   lt knee      meniscus tear   MOHS SURGERY     right nostril for basel cell cancer    NOSE SURGERY     due to trama at age 87    TOTAL ABDOMINAL HYSTERECTOMY W/ BILATERAL SALPINGOOPHORECTOMY     for Endometriosis    TOTAL SHOULDER ARTHROPLASTY Right 09/11/2013   Procedure: TOTAL SHOULDER ARTHROPLASTY;  Surgeon: Marybelle Killings, MD;  Location: [facility];  Service: Orthopedics;  Laterality: Right;  Right Total Shoulder Arthroplasty    There were no vitals filed for this visit.    Subjective Assessment - 03/28/21 1145     Subjective Patient reports that the ENT thought the R ear was causing her imbalance. Had positional testing completed at ENT and it was negative. Patient reports some fullness in the R ear. Patient reports that she feels very imbalanced. She is currently using the [facility] outdoors, but not indoors. Reports she goes to [employer] twice a week. Reports she does have blurred vision.    Patient is accompained by: Family member    Pertinent History Arthritis, Bursitis, Diverticulosis, Headaches, HLD, Hypotension, Osteoporosis, Hx of skin cancer, Macular Degeneration    Limitations House hold activities;Standing;Walking    Currently in Pain? Yes    Pain Score 8     Pain Location Knee    Pain Orientation Left    Pain Descriptors / Indicators Sore    Pain Type Chronic pain    Pain Onset More than a month ago    Pain  Frequency Constant                [facility] PT Assessment - 03/28/21 0001       Assessment   Medical Diagnosis Imbalance    Referring Provider (PT) Jolene Provost, [name]    Onset Date/Surgical Date 02/28/21    Hand Dominance Left    Prior Therapy at this clinic 3 years ago      Precautions   Precautions Fall;Other (comment)    Precaution Comments Arthritis, Bursitis, Diverticulosis, Headaches, HLD, Hypotension, Osteoporosis, Hx of skin cancer.      Balance Screen   Has the patient fallen in the past 6 months Yes    How many times? 1   reports she fell when she turned around after getting out of the car   Has the patient had a decrease in activity level because of a fear of falling?  Yes    Is the patient reluctant to leave their home because of a fear of falling?  Yes      [place] residence    Living Arrangements Alone    Available Help at Discharge Family    Type of [place] to enter    Entrance Stairs-Number of Steps 4    [place] One level    [place] - single point;[place] - 2 wheels    Additional Comments reports she does not use RW      Prior Function   Level of Independence Independent with household mobility without device;Independent with community mobility without device      Cognition   Overall Cognitive Status Within Functional Limits for tasks assessed      Observation/Other Assessments   Focus on Therapeutic Outcomes (FOTO)  staff did not capture on eval      Sensation   Light Touch Appears Intact      ROM / Strength   AROM / PROM / Strength Strength      Strength   Overall Strength Deficits    Strength Assessment Site Hip;Knee;Ankle    Right/Left Hip Right;Left    Right Hip Flexion 4-/5    Left Hip Flexion 4-/5    Right/Left Knee Right;Left    Right Knee Flexion 4-/5    Right Knee Extension 4-/5    Left Knee Flexion 4-/5    Left  Knee Extension 3+/5      Transfers   Transfers Sit to Stand;Stand to Sit    Sit to Stand 5: Supervision    Five time sit to stand comments  13.91 secs without UE support    Stand to Sit 5: Supervision      Ambulation/Gait   Ambulation/Gait Yes    Ambulation/Gait Assistance 5: Supervision    Ambulation/Gait Assistance Details ambulation x 115 ft with SPC and without. more notable wide BOS without use of AD    Ambulation Distance (Feet) 115 Feet    Assistive device Straight cane    Gait Pattern Wide base of support    Ambulation Surface Level;Indoor    Gait velocity 12.04 secs = 2.72 ft/sec      Standardized Balance Assessment   Standardized Balance Assessment Berg Balance Test      Berg Balance Test   Sit to Stand Able to stand without using hands and stabilize independently    Standing Unsupported Able to stand safely 2 minutes    Sitting with Back Unsupported but Feet Supported on Floor or Stool Able to sit safely and securely 2 minutes    Stand to Sit Sits safely with minimal use of hands    Transfers Able to transfer safely, minor use of hands    Standing Unsupported with Eyes Closed Able to stand 10 seconds with supervision    Standing Unsupported with Feet Together Able to place feet together independently and stand 1 minute safely    From Standing, Reach Forward with Outstretched Arm Can reach forward >12 cm safely (5")   9"   From Standing Position, Pick up Object from Floor Able to pick up shoe safely and easily    From Standing Position, Turn to Look Behind Over each Shoulder Looks behind one side only/other side shows less weight shift    Turn 360 Degrees Able to turn 360 degrees safely one side only in 4 seconds or less    Standing Unsupported, Alternately Place Feet on Step/Stool Able to complete 4 steps without aid or supervision    Standing Unsupported, One Foot in Front Able to take small step independently and hold 30 seconds    Standing on One Leg Tries to lift  leg/unable to hold 3 seconds but remains standing independently    Total Score 45    Berg comment: 45/56                    Vestibular Assessment - 03/28/21 0001       Symptom Behavior   Subjective history of current problem "i feel imbalanced"    Type of Dizziness  Imbalance;Blurred vision;Lightheadedness;Unsteady with head/body turns    Frequency of Dizziness reports dizziness is increased when ears are stopped up, quick movements    Duration of Dizziness can lasts minutes to hours    Symptom Nature Intermittent;Motion provoked    Aggravating Factors Sit to stand;Forward bending;Turning body quickly    Relieving Factors Slow movements    Progression of Symptoms Worse      Oculomotor Exam   Oculomotor Alignment Normal    Ocular ROM WNL    Spontaneous Absent    Gaze-induced  Absent    Saccades Intact      Oculomotor Exam-Fixation Suppressed    Left Head Impulse Negative    Right Head Impulse Positive      Vestibulo-Ocular Reflex   VOR 1 Head Only (x 1 viewing) Mild Dizziness reported  VOR Cancellation Normal                Objective measurements completed on examination: See above findings.                PT Education - 03/28/21 1149     Education Details Educated on [employer]; Transition to [employer] per patient request    Person(s) Educated Patient    Methods Explanation    Comprehension Verbalized understanding                 PT Long Term Goals - 03/28/21 1257       PT LONG TERM GOAL #1   Title Patient will be independent with vestibular/balance HEP    Baseline no HEP established    Time 4    Period Weeks    Status New      PT LONG TERM GOAL #2   Title Patient will improve Berg Balance to >/= 49/56 to demo reduced fall risk and improved balance    Baseline 45/56    Time 4    Period Weeks    Status New      PT LONG TERM GOAL #3   Title Patient will improve gait speed to >/= 3.0 ft/sec  to demo improved community mobility    Baseline 2.72 ft/sec    Time 4    Period Weeks    Status New      PT LONG TERM GOAL #4   Title Patient will report understanding of fall prevention within the home to promote improved safety    Baseline dependent    Time 4    Period Weeks    Status New                    Plan - 03/28/21 1300     Clinical Impression Statement Patient is a 87 y.o. female referred to Neuro OPPT services for imbalance/dizziness. Patient's PMH significant for the following: Arthritis, Bursitis, Diverticulosis, Headaches, HLD, Hypotension, Osteoporosis, Hx of skin cancer, Macular Degeneration. Upon evaluation patient presents with the following impairments: decreased strength, decreased balance, dizziness with positive R HIT indicating impaired VOR function, and increased fall risk. Patient is currently ambulating at 2.72 ft/sec with SPC. Patient is at moderate fall risk with Merrilee Jansky Balance score of 45/56. Patient will benefit from skilled PT services to address impairments and reduce the risk for falls.    Personal Factors and Comorbidities Comorbidity 3+    Comorbidities Arthritis, Bursitis, Diverticulosis, Headaches, HLD, Hypotension, Osteoporosis, Hx of skin cancer, Macular Degeneration    Examination-Activity Limitations Bend;Locomotion Level    Examination-Participation Restrictions Cleaning;Meal Prep;Yard Work;Community Activity    Stability/Clinical Decision Making Stable/Uncomplicated    Clinical Decision Making Low    Rehab Potential Good    PT Frequency 1x / week    PT Duration 4 weeks    PT Treatment/Interventions ADLs/Self Care Home Management;Canalith Repostioning;Cryotherapy;Moist Heat;DME Instruction;Gait training;Stair training;Functional mobility training;Therapeutic activities;Neuromuscular re-education;Balance training;Therapeutic exercise;Patient/family education;Manual techniques;Passive range of motion;Vestibular    PT Next Visit Plan  Transition to [place]. Review VOR provided at previous POC and update to patient's tolerance. Initiate balance HEP    Consulted and Agree with Plan of Care Patient;Family member/caregiver    Family Member Consulted Daughter             Patient will benefit from skilled therapeutic intervention in order to improve the following deficits and impairments:  Decreased balance, Decreased activity tolerance, Decreased strength, Pain, Dizziness,  Decreased knowledge of use of DME, Abnormal gait  Visit Diagnosis: Unsteadiness on feet  Dizziness and giddiness  Muscle weakness (generalized)     Problem List Patient Active Problem List   Diagnosis Date Noted   Optic atrophy of both eyes 12/10/2020   Intermediate stage nonexudative age-related macular degeneration of both eyes 05/21/2020   Primary open angle glaucoma of both eyes, severe stage 05/21/2020   Scarring, chorioretinal, 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 Brandy Hale 08/24/2012   Hot flashes - followed by gyn, Dr. Gertie Fey 08/24/2012   Osteoarthritis of left knee 05/17/2012    Class: Diagnosis of   LOW BACK PAIN SYNDROME 05/07/2010   VITAMIN D DEFICIENCY 04/22/2009   Allergic rhinitis 03/12/2008   Hyperlipemia 10/22/2006    Jones Bales, PT, DPT 03/28/2021, 1:33 PM  [facility] [address] [place], [place], [address] Phone: [phone]   Fax:  [phone]  Name: Heidi Moore MRN: [id number] Date of Birth: Jun 22, 1919

## 2021-03-29 LAB — HEMOGLOBIN A1C
Hgb A1c MFr Bld: 5.1 % of total Hgb (ref <5.7)
Mean Plasma Glucose: 100 mg/dL
eAG (mmol/L): 5.5 mmol/L

## 2021-03-29 LAB — T4, FREE: Free T4: 0.8 ng/dL (ref 0.8–1.8)

## 2021-03-29 LAB — T3, FREE: T3, Free: 2.8 pg/mL (ref 2.3–4.2)

## 2021-03-29 LAB — TSH: TSH: 7.75 mIU/L — ABNORMAL HIGH (ref 0.40–4.50)

## 2021-04-01 ENCOUNTER — Ambulatory Visit: Payer: Medicare PPO | Admitting: Physical Therapy

## 2021-04-01 ENCOUNTER — Other Ambulatory Visit: Payer: Self-pay

## 2021-04-01 ENCOUNTER — Telehealth: Payer: Self-pay

## 2021-04-01 ENCOUNTER — Encounter: Payer: Self-pay | Admitting: Physical Therapy

## 2021-04-01 DIAGNOSIS — M6281 Muscle weakness (generalized): Secondary | ICD-10-CM | POA: Diagnosis not present

## 2021-04-01 DIAGNOSIS — R2681 Unsteadiness on feet: Secondary | ICD-10-CM

## 2021-04-01 DIAGNOSIS — R42 Dizziness and giddiness: Secondary | ICD-10-CM | POA: Diagnosis not present

## 2021-04-01 MED ORDER — LEVOTHYROXINE SODIUM 50 MCG PO TABS: 50.0000 ug | ORAL_TABLET | ORAL | 0 refills | Status: DC

## 2021-04-01 NOTE — Therapy (Signed)
[facility] [address] [place], [place], [address] Phone: [phone]   Fax:  [phone]  Physical Therapy Treatment  Patient Details  Name: Heidi Moore MRN: [id number] Date of Birth: 1919-07-11 Referring Provider (PT): Jolene Provost, [place]   Encounter Date: 04/01/2021   PT End of Session - 04/01/21 1401     Visit Number 2    Number of Visits 5    Date for PT Re-Evaluation 04/25/21    Authorization Type [employer] Medicare    Authorization Time Period 5 visits approved 11/18-12/16    Progress Note Due on Visit 10    PT Start Time 1315    PT Stop Time 1400    PT Time Calculation (min) 45 min    Equipment Utilized During Treatment Gait belt    Activity Tolerance Patient tolerated treatment well    Behavior During Therapy [facility] for tasks assessed/performed             Past Medical History:  Diagnosis Date   AC (acromioclavicular) joint bone spurs    on right shoulder    Allergic rhinitis    Arthritis    Bursitis    right shoulder   Complication of anesthesia    headaches   Diverticulosis    DIVERTICULOSIS, COLON 10/15/2006   Qualifier: Diagnosis of  By: Linna Darner MD, Lorretta Harp problem    treated by Dr Kathlen Mody per patient   MPNTIRWE(315.4)    sinus HA   History of blood transfusion 5yrs ago   no abnormal reaction noted   History of cystitis    History of skin cancer    Basal cell, Dr. Lindwood Coke   Hyperlipidemia    [place] Study LDL goal =<160    Hypotension    Joint pain    Joint swelling    Macular degeneration, dry    Osteopenia    Intolerance to Calcium,Actonel,Fosamax,Evista, Forteo: S/P Boniva 2007 to 03/2010   OSTEOPENIA 03/12/2008   Qualifier: Diagnosis of  By: Velora Heckler     Osteoporosis    PONV (postoperative nausea and vomiting)    Rosacea 04/22/2009   Qualifier: Diagnosis of  By: Linna Darner MD, [place]     Skin cancer 06/22/2017   per patient after excision right calf by Dr Ubaldo Glassing    SKIN CANCER, HX OF 03/12/2008   Qualifier: Diagnosis of  By: Velora Heckler     Urinary frequency    Vaginal atrophy    sees Dr. Gertie Fey    Past Surgical History:  Procedure Laterality Date   APPENDECTOMY     BUNIONECTOMY Bilateral    CATARACT EXTRACTION, BILATERAL     COLONOSCOPY     with Tics (no further f/u as per GI due to age)    JOINT REPLACEMENT Left    knee    KNEE ARTHROPLASTY  05/18/2012   Procedure: COMPUTER ASSISTED TOTAL KNEE ARTHROPLASTY;  Surgeon: Marybelle Killings, MD;  Location: [facility];  Service: Orthopedics;  Laterality: Left;  Left Total Knee Arthroplasty, Cemented, Computer Assisted   lt knee      meniscus tear   MOHS SURGERY     right nostril for basel cell cancer    NOSE SURGERY     due to trama at age 48    TOTAL ABDOMINAL HYSTERECTOMY W/ BILATERAL SALPINGOOPHORECTOMY     for Endometriosis    TOTAL SHOULDER ARTHROPLASTY Right 09/11/2013   Procedure: TOTAL SHOULDER ARTHROPLASTY;  Surgeon: Elta Guadeloupe  Jule Economy, MD;  Location: [facility];  Service: Orthopedics;  Laterality: Right;  Right Total Shoulder Arthroplasty    There were no vitals filed for this visit.   Subjective Assessment - 04/01/21 1312     Subjective Not sure if her macular degeneration or glaucoma is contributing to her dizziness. Dizziness worse when standing up. Feels like a jolt sometimes, mostly feels unsteady and swimmy headed. Was not given any exercises at last session.    Patient is accompained by: Family member   daughter   Pertinent History Arthritis, Bursitis, Diverticulosis, Headaches, HLD, Hypotension, Osteoporosis, Hx of skin cancer, Macular Degeneration    Currently in Pain? No/denies                                Vestibular Treatment/Exercise - 04/01/21 0001       Vestibular Treatment/Exercise   Habituation Exercises Nestor Lewandowsky    Gaze Exercises X1 Viewing Horizontal;X1 Viewing Vertical;Comment      Nestor Lewandowsky   Number of Reps  2    Symptom Description  EO 1x  each; EC 1x each   mild dizziness to L with EO     X1 Viewing Horizontal   Foot Position sitting, standing    Reps 10   2x10   Comments reports sensation of "wave over my eyes" with R head turn      X1 Viewing Vertical   Foot Position sitting, standing    Reps 10   2x10   Comments c/o mild dizziness with cervical flexion      Eye/Head Exercise Vertical   Comment sitting horizontal and vertical saccades 10x each   c/o swimmy headedness to L               Balance Exercises - 04/01/21 0001       Balance Exercises: Standing   Sit to Stand Standard surface   5x EC; 5x on foam with gaze stability; c/o dull ache in forehead and nose               PT Education - 04/01/21 1400     Education Details HEP- Access Code: [email or web address]    Person(s) Educated Patient;Child(ren)    Methods Explanation;Demonstration;Tactile cues;Verbal cues;Handout    Comprehension Verbalized understanding;Returned demonstration                 PT Long Term Goals - 04/01/21 1648       PT LONG TERM GOAL #1   Title Patient will be independent with vestibular/balance HEP    Baseline no HEP established    Time 4    Period Weeks    Status On-going      PT LONG TERM GOAL #2   Title Patient will improve Berg Balance to >/= 49/56 to demo reduced fall risk and improved balance    Baseline 45/56    Time 4    Period Weeks    Status On-going      PT LONG TERM GOAL #3   Title Patient will improve gait speed to >/= 3.0 ft/sec to demo improved community mobility    Baseline 2.72 ft/sec    Time 4    Period Weeks    Status On-going      PT LONG TERM GOAL #4   Title Patient will report understanding of fall prevention within the home to promote improved safety    Baseline dependent    Time  4    Period Weeks    Status On-going                   Plan - 04/01/21 1402     Clinical Impression Statement Patient arrived to session with daughter with report of dizziness when standing  up. Denies dizziness but reports unsteadiness and "swimmy headedness."  Initiated VOR activities with patient reporting sensation of "wave over my eyes" with R head turn and saccades to L. Symptoms were fairly mild throughout. Patient was able to demonstrate good balance with standing activities and was asymptomatic when trialing habituation. Patient reported understanding of HEP update. Patient and daughter report that the patient typically blows her leaves in the yard; advised to avoid doing this activity d/t current unsteadiness- both reported understanding. No complaints upon leaving.    Comorbidities Arthritis, Bursitis, Diverticulosis, Headaches, HLD, Hypotension, Osteoporosis, Hx of skin cancer, Macular Degeneration    PT Treatment/Interventions ADLs/Self Care Home Management;Canalith Repostioning;Cryotherapy;Moist Heat;DME Instruction;Gait training;Stair training;Functional mobility training;Therapeutic activities;Neuromuscular re-education;Balance training;Therapeutic exercise;Patient/family education;Manual techniques;Passive range of motion;Vestibular    PT Next Visit Plan progress VOR and standing balance    Consulted and Agree with Plan of Care Patient;Family member/caregiver    Family Member Consulted Daughter             Patient will benefit from skilled therapeutic intervention in order to improve the following deficits and impairments:  Decreased balance, Decreased activity tolerance, Decreased strength, Pain, Dizziness, Decreased knowledge of use of DME, Abnormal gait  Visit Diagnosis: Unsteadiness on feet  Dizziness and giddiness  Muscle weakness (generalized)     Problem List Patient Active Problem List   Diagnosis Date Noted   Optic atrophy of both eyes 12/10/2020   Intermediate stage nonexudative age-related macular degeneration of both eyes 05/21/2020   Primary open angle glaucoma of both eyes, severe stage 05/21/2020   Scarring, chorioretinal, bilateral  05/21/2020   Edema 01/26/2020   Lower extremity edema 01/26/2020   Rosacea 02/19/2014   Osteoarthritis of right shoulder 09/11/2013    Class: Diagnosis of   Macular degeneration - followed by optho, Dr. Brandy Hale 08/24/2012   Hot flashes - followed by gyn, Dr. Gertie Fey 08/24/2012   Osteoarthritis of left knee 05/17/2012    Class: Diagnosis of   LOW BACK PAIN SYNDROME 05/07/2010   VITAMIN D DEFICIENCY 04/22/2009   Allergic rhinitis 03/12/2008   Hyperlipemia 10/22/2006     Janene Harvey, PT, DPT 04/01/21 4:49 PM    [facility] [address], [place], [place], [address] Phone: [phone]   Fax:  [phone]  Name: Heidi Moore MRN: [id number] Date of Birth: March 20, 1920

## 2021-04-01 NOTE — Telephone Encounter (Signed)
Rx done. 

## 2021-04-01 NOTE — Telephone Encounter (Signed)
Patient returned call and was informed of results and verbalized understanding patient stated a Rx would need to be sent to the pharmacy levothyroxine (SYNTHROID) 50 MCG tablet

## 2021-04-08 ENCOUNTER — Other Ambulatory Visit: Payer: Self-pay

## 2021-04-08 ENCOUNTER — Encounter: Payer: Self-pay | Admitting: Physical Therapy

## 2021-04-08 ENCOUNTER — Ambulatory Visit: Payer: Medicare PPO | Admitting: Physical Therapy

## 2021-04-08 DIAGNOSIS — M6281 Muscle weakness (generalized): Secondary | ICD-10-CM

## 2021-04-08 DIAGNOSIS — R42 Dizziness and giddiness: Secondary | ICD-10-CM

## 2021-04-08 DIAGNOSIS — R2681 Unsteadiness on feet: Secondary | ICD-10-CM | POA: Diagnosis not present

## 2021-04-08 NOTE — Therapy (Signed)
[facility] [address], [place], [place], [address] Phone: [phone]   Fax:  [phone]  Physical Therapy Treatment  Patient Details  Name: Heidi Moore MRN: [id number] Date of Birth: 06-Mar-1920 Referring Provider (PT): Jolene Provost, [place]   Encounter Date: 04/08/2021   PT End of Session - 04/08/21 1625     Visit Number 3    Number of Visits 5    Date for PT Re-Evaluation 04/25/21    Authorization Type [employer]    Authorization Time Period 5 visits approved 11/18-12/16    Progress Note Due on Visit 10    PT Start Time 1450    PT Stop Time 1535    PT Time Calculation (min) 45 min    Equipment Utilized During Treatment Gait belt    Activity Tolerance Patient tolerated treatment well    Behavior During Therapy [facility] for tasks assessed/performed             Past Medical History:  Diagnosis Date   AC (acromioclavicular) joint bone spurs    on right shoulder    Allergic rhinitis    Arthritis    Bursitis    right shoulder   Complication of anesthesia    headaches   Diverticulosis    DIVERTICULOSIS, COLON 10/15/2006   Qualifier: Diagnosis of  By: Linna Darner MD, Lorretta Harp problem    treated by Dr Kathlen Mody per patient   WNIOEVOJ(500.9)    sinus HA   History of blood transfusion 85yrs ago   no abnormal reaction noted   History of cystitis    History of skin cancer    Basal cell, Dr. Lindwood Coke   Hyperlipidemia    [place] Study LDL goal =<160    Hypotension    Joint pain    Joint swelling    Macular degeneration, dry    Osteopenia    Intolerance to Calcium,Actonel,Fosamax,Evista, Forteo: S/P Boniva 2007 to 03/2010   OSTEOPENIA 03/12/2008   Qualifier: Diagnosis of  By: Velora Heckler     Osteoporosis    PONV (postoperative nausea and vomiting)    Rosacea 04/22/2009   Qualifier: Diagnosis of  By: Linna Darner MD, [place]     Skin cancer 06/22/2017   per patient after excision right calf by Dr Ubaldo Glassing    SKIN CANCER, HX OF 03/12/2008   Qualifier: Diagnosis of  By: Velora Heckler     Urinary frequency    Vaginal atrophy    sees Dr. Gertie Fey    Past Surgical History:  Procedure Laterality Date   APPENDECTOMY     BUNIONECTOMY Bilateral    CATARACT EXTRACTION, BILATERAL     COLONOSCOPY     with Tics (no further f/u as per GI due to age)    JOINT REPLACEMENT Left    knee    KNEE ARTHROPLASTY  05/18/2012   Procedure: COMPUTER ASSISTED TOTAL KNEE ARTHROPLASTY;  Surgeon: Marybelle Killings, MD;  Location: [place];  Service: Orthopedics;  Laterality: Left;  Left Total Knee Arthroplasty, Cemented, Computer Assisted   lt knee      meniscus tear   MOHS SURGERY     right nostril for basel cell cancer    NOSE SURGERY     due to trama at age 87    TOTAL ABDOMINAL HYSTERECTOMY W/ BILATERAL SALPINGOOPHORECTOMY     for Endometriosis    TOTAL SHOULDER ARTHROPLASTY Right 09/11/2013   Procedure: TOTAL SHOULDER ARTHROPLASTY;  Surgeon: Elta Guadeloupe  Jule Economy, MD;  Location: [facility];  Service: Orthopedics;  Laterality: Right;  Right Total Shoulder Arthroplasty    There were no vitals filed for this visit.   Subjective Assessment - 04/08/21 1451     Subjective Went to the gym and worked on the treadmill, rowing machine, and leg press. Holds on when walking on the treadmill. L knee is bothering her. This AM she noticed some blurred vision and dizziness- still having some now but denies other symptoms besides dry eyes.    Patient is accompained by: Family member   daughter   Pertinent History Arthritis, Bursitis, Diverticulosis, Headaches, HLD, Hypotension, Osteoporosis, Hx of skin cancer, Macular Degeneration    Currently in Pain? No/denies                               [facility] Adult PT Treatment/Exercise - 04/08/21 0001       Exercises   Exercises Knee/Hip      Knee/Hip Exercises: Aerobic   Nustep L3 x 2 min             Vestibular Treatment/Exercise - 04/08/21 0001       X1 Viewing  Horizontal   Foot Position sitting, standing    Reps 10    Comments c/o mild dizziness      X1 Viewing Vertical   Foot Position sitting, standing    Reps 10    Comments c/o milder dizziness      Eye/Head Exercise Vertical   Comment horizontal saccades 10x each sitting and standing   c/o slightly blurred vision when looking R but no dizziness               Balance Exercises - 04/08/21 0001       Balance Exercises: Standing   Sit to Stand Standard surface   5x with EC, 5x with EO on foam with CGA   Other Standing Exercises Comments alt toe tap on cone with CGA/1 HHA 2x20                PT Education - 04/08/21 1624     Education Details update to HEP- Access Code: [email or web address] to be performed at counter top for safety; advised patient to try sitting aerobic training rather than treadmill to avoid falls    Person(s) Educated Patient;Child(ren)   daughter   Methods Explanation;Demonstration;Tactile cues;Handout;Verbal cues    Comprehension Verbalized understanding;Returned demonstration                 PT Long Term Goals - 04/01/21 1648       PT LONG TERM GOAL #1   Title Patient will be independent with vestibular/balance HEP    Baseline no HEP established    Time 4    Period Weeks    Status On-going      PT LONG TERM GOAL #2   Title Patient will improve Berg Balance to >/= 49/56 to demo reduced fall risk and improved balance    Baseline 45/56    Time 4    Period Weeks    Status On-going      PT LONG TERM GOAL #3   Title Patient will improve gait speed to >/= 3.0 ft/sec to demo improved community mobility    Baseline 2.72 ft/sec    Time 4    Period Weeks    Status On-going      PT LONG TERM GOAL #4   Title  Patient will report understanding of fall prevention within the home to promote improved safety    Baseline dependent    Time 4    Period Weeks    Status On-going                   Plan - 04/08/21 1626     Clinical Impression  Statement Patient arrived to session with report of some L knee soreness after working out at the gym today. Reports doing several activities at the gym including walking on the treadmill. Advised patient to try sitting aerobic training rather than treadmill to avoid falls and trialed Nustep today which patient tolerated without c/o knee pain. Patient remarked on improving ability to stand from sitting which was evident today. Performed STS transfers with additional challenges; patient requiring CGA-min A for posterior LOB with good carryover of cues to shift hips anteriorly. Review of HEP required today and cues given for max carryover and for proper form. Patient reported c/o slightly blurred vision with R saccades but no longer reporting dizziness. VOR training was progressed to standing from sitting with good tolerance but mild c/o dizziness. Patient reported understanding of HEP update and without complaints at end of session.    Comorbidities Arthritis, Bursitis, Diverticulosis, Headaches, HLD, Hypotension, Osteoporosis, Hx of skin cancer, Macular Degeneration    PT Treatment/Interventions ADLs/Self Care Home Management;Canalith Repostioning;Cryotherapy;Moist Heat;DME Instruction;Gait training;Stair training;Functional mobility training;Therapeutic activities;Neuromuscular re-education;Balance training;Therapeutic exercise;Patient/family education;Manual techniques;Passive range of motion;Vestibular    PT Next Visit Plan progress VOR and standing balance    Consulted and Agree with Plan of Care Patient;Family member/caregiver    Family Member Consulted Daughter             Patient will benefit from skilled therapeutic intervention in order to improve the following deficits and impairments:  Decreased balance, Decreased activity tolerance, Decreased strength, Pain, Dizziness, Decreased knowledge of use of DME, Abnormal gait  Visit Diagnosis: Unsteadiness on feet  Dizziness and  giddiness  Muscle weakness (generalized)     Problem List Patient Active Problem List   Diagnosis Date Noted   Optic atrophy of both eyes 12/10/2020   Intermediate stage nonexudative age-related macular degeneration of both eyes 05/21/2020   Primary open angle glaucoma of both eyes, severe stage 05/21/2020   Scarring, chorioretinal, 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 Brandy Hale 08/24/2012   Hot flashes - followed by gyn, Dr. Gertie Fey 08/24/2012   Osteoarthritis of left knee 05/17/2012    Class: Diagnosis of   LOW BACK PAIN SYNDROME 05/07/2010   VITAMIN D DEFICIENCY 04/22/2009   Allergic rhinitis 03/12/2008   Hyperlipemia 10/22/2006     Janene Harvey, PT, DPT 04/08/21 4:31 PM   [facility] [address] [place], [place], [address] Phone: [phone]   Fax:  [phone]  Name: VENA BASSINGER MRN: [id number] Date of Birth: 12-31-1919

## 2021-04-10 ENCOUNTER — Telehealth: Payer: Self-pay | Admitting: Family Medicine

## 2021-04-10 NOTE — Telephone Encounter (Signed)
Patient complains of loose stools, nausea and upper stomach pain, lack of sleep, dizziness, increased urination and blurred vision x6 days. Patient was advised a visit is needed due to these symptoms and declined scheduling an appt.  Message sent to PCP.

## 2021-04-10 NOTE — Telephone Encounter (Signed)
Patient complains of loose stools, nausea and upper stomach pain, lack of sleep, dizziness, increased urination and blurred vision x6 days after taking second dose of Levothyroxine.

## 2021-04-10 NOTE — Telephone Encounter (Signed)
Pt call and stated she is having bad size effect from her thyroid med and want dr.Koberlein or Mechele Claude to give her a call back I sent her to the triage nurse and tried to make her a virtual appt but she stated she want to talk to dr.Koberlein before she take any more  of thyroid medicine.

## 2021-04-11 NOTE — Telephone Encounter (Signed)
Patient is aware.  Patient requests a virtual visit.

## 2021-04-11 NOTE — Telephone Encounter (Signed)
Please clarify dose with her. Previously she was doing half tablet of synthroid, so I had advised just to increase to a full tablet 2 days a week, continue half tab other days. Current med list does not reflect this and states 31mcg twice weekly. She may ned just to decrease dose again, but I need to know what she is taking now and what change was made after last bloodwork.

## 2021-04-15 DIAGNOSIS — M5136 Other intervertebral disc degeneration, lumbar region: Secondary | ICD-10-CM | POA: Diagnosis not present

## 2021-04-15 DIAGNOSIS — M9904 Segmental and somatic dysfunction of sacral region: Secondary | ICD-10-CM | POA: Diagnosis not present

## 2021-04-15 DIAGNOSIS — M9903 Segmental and somatic dysfunction of lumbar region: Secondary | ICD-10-CM | POA: Diagnosis not present

## 2021-04-15 DIAGNOSIS — M9905 Segmental and somatic dysfunction of pelvic region: Secondary | ICD-10-CM | POA: Diagnosis not present

## 2021-04-16 ENCOUNTER — Ambulatory Visit: Payer: Medicare PPO | Attending: Physician Assistant | Admitting: Physical Therapy

## 2021-04-16 ENCOUNTER — Other Ambulatory Visit: Payer: Self-pay

## 2021-04-16 ENCOUNTER — Encounter: Payer: Self-pay | Admitting: Physical Therapy

## 2021-04-16 DIAGNOSIS — R42 Dizziness and giddiness: Secondary | ICD-10-CM | POA: Insufficient documentation

## 2021-04-16 DIAGNOSIS — M6281 Muscle weakness (generalized): Secondary | ICD-10-CM | POA: Diagnosis not present

## 2021-04-16 DIAGNOSIS — R2681 Unsteadiness on feet: Secondary | ICD-10-CM | POA: Diagnosis not present

## 2021-04-16 NOTE — Therapy (Signed)
[facility] [address] [place], [place], [address] Phone: [phone]   Fax:  [phone]  Physical Therapy Treatment  Patient Details  Name: Heidi Moore MRN: [id number] Date of Birth: [birth year] Referring Provider (PT): Jolene Provost, [place]   Encounter Date: 04/16/2021   PT End of Session - 04/16/21 1536     Visit Number 4    Number of Visits 5    Date for PT Re-Evaluation 04/25/21    Authorization Type [employer]    Authorization Time Period 5 visits approved 11/18-12/16    Progress Note Due on Visit 10    PT Start Time 1442    PT Stop Time 1536    PT Time Calculation (min) 54 min    Equipment Utilized During Treatment Gait belt    Activity Tolerance Patient tolerated treatment well    Behavior During Therapy [facility] for tasks assessed/performed             Past Medical History:  Diagnosis Date   AC (acromioclavicular) joint bone spurs    on right shoulder    Allergic rhinitis    Arthritis    Bursitis    right shoulder   Complication of anesthesia    headaches   Diverticulosis    DIVERTICULOSIS, COLON 10/15/2006   Qualifier: Diagnosis of  By: Linna Darner MD, Lorretta Harp problem    treated by Dr Kathlen Mody per patient   DVVOHYWV(371.0)    sinus HA   History of blood transfusion 85yrs ago   no abnormal reaction noted   History of cystitis    History of skin cancer    Basal cell, Dr. Lindwood Coke   Hyperlipidemia    [place] Study LDL goal =<160    Hypotension    Joint pain    Joint swelling    Macular degeneration, dry    Osteopenia    Intolerance to Calcium,Actonel,Fosamax,Evista, Forteo: S/P Boniva 2007 to 03/2010   OSTEOPENIA 03/12/2008   Qualifier: Diagnosis of  By: Velora Heckler     Osteoporosis    PONV (postoperative nausea and vomiting)    Rosacea 04/22/2009   Qualifier: Diagnosis of  By: Linna Darner MD, William     Skin cancer 06/22/2017   per patient after excision right calf by Dr Ubaldo Glassing    SKIN CANCER, HX OF 03/12/2008   Qualifier: Diagnosis of  By: Velora Heckler     Urinary frequency    Vaginal atrophy    sees Dr. Gertie Fey    Past Surgical History:  Procedure Laterality Date   APPENDECTOMY     BUNIONECTOMY Bilateral    CATARACT EXTRACTION, BILATERAL     COLONOSCOPY     with Tics (no further f/u as per GI due to age)    JOINT REPLACEMENT Left    knee    KNEE ARTHROPLASTY  05/18/2012   Procedure: COMPUTER ASSISTED TOTAL KNEE ARTHROPLASTY;  Surgeon: Marybelle Killings, MD;  Location: [facility];  Service: Orthopedics;  Laterality: Left;  Left Total Knee Arthroplasty, Cemented, Computer Assisted   lt knee      meniscus tear   MOHS SURGERY     right nostril for basel cell cancer    NOSE SURGERY     due to trama at age 87    TOTAL ABDOMINAL HYSTERECTOMY W/ BILATERAL SALPINGOOPHORECTOMY     for Endometriosis    TOTAL SHOULDER ARTHROPLASTY Right 09/11/2013   Procedure: TOTAL SHOULDER ARTHROPLASTY;  Surgeon: Elta Guadeloupe  Jule Economy, MD;  Location: [facility];  Service: Orthopedics;  Laterality: Right;  Right Total Shoulder Arthroplasty    There were no vitals filed for this visit.   Subjective Assessment - 04/16/21 1438     Subjective Reports that after taking her thyroid meds she feels dizzy and had blurred vision. Brought in her stool to show how shes doing with her HEP. Has a virtual appointment Friday.    Patient is accompained by: Family member    Pertinent History Arthritis, Bursitis, Diverticulosis, Headaches, HLD, Hypotension, Osteoporosis, Hx of skin cancer, Macular Degeneration    Currently in Pain? Yes    Pain Score 8     Pain Location Knee    Pain Orientation Left    Pain Descriptors / Indicators Sore    Pain Type Chronic pain                               [facility] Adult PT Treatment/Exercise - 04/16/21 0001       Neuro Re-ed    Neuro Re-ed Details  alt toe tap on patient's 6" stool 2x20; R/L fwd/back stepping + head nods 10x each with CGA-min A                  Balance Exercises - 04/16/21 0001       Balance Exercises: Standing   Gait with Head Turns Forward;Limitations;2 reps    Gait with Head Turns Limitations c/o mild "wavy feeling" when turning head R   mild-mod instability   Turning Right;Left;10 reps;Limitations    Turning Limitations 1/2 turns to targets   no dizziness   Other Standing Exercises sidestepping with red loop around ankles with B HHA 2x58ft; walking backward 2x50ft each with EO and EC with CGA                PT Education - 04/16/21 1529     Education Details update to HEP-Access Code: [email or web address] ; discussion on patient's current progress and symptoms; reminded patient to acoid treadmill and continue walking with cane d/t unsteadiness    Person(s) Educated Patient;Child(ren)    Methods Explanation;Demonstration;Tactile cues;Verbal cues;Handout    Comprehension Verbalized understanding;Returned demonstration                 PT Long Term Goals - 04/01/21 1648       PT LONG TERM GOAL #1   Title Patient will be independent with vestibular/balance HEP    Baseline no HEP established    Time 4    Period Weeks    Status On-going      PT LONG TERM GOAL #2   Title Patient will improve Berg Balance to >/= 49/56 to demo reduced fall risk and improved balance    Baseline 45/56    Time 4    Period Weeks    Status On-going      PT LONG TERM GOAL #3   Title Patient will improve gait speed to >/= 3.0 ft/sec to demo improved community mobility    Baseline 2.72 ft/sec    Time 4    Period Weeks    Status On-going      PT LONG TERM GOAL #4   Title Patient will report understanding of fall prevention within the home to promote improved safety    Baseline dependent    Time 4    Period Weeks    Status On-going  Plan - 04/16/21 1537     Clinical Impression Statement Patient arrived to session with daughter with report that she feels dizziness and blurred vision after taking  her thyroid medication. Advised patient to speak with her prescribing MD about these concerns.- patient reports that she has a televisit this Friday. Reviewed toe tap activity with CGA and cueing to increase foot clearance. Worked on dynamic balance activities today incorporating head movements and gaze stabilization. Patient required CGA-min A d/t imbalance. Cueing provided throughout to improve form/coordination of movement. Mild c/o "wavy feeling" when turning head to the R with gait. Patient had some trouble differentiating between blurred vision which is constant vs. dizziness. Reported understanding of new HEP and without complaints at end of session.    Comorbidities Arthritis, Bursitis, Diverticulosis, Headaches, HLD, Hypotension, Osteoporosis, Hx of skin cancer, Macular Degeneration    PT Treatment/Interventions ADLs/Self Care Home Management;Canalith Repostioning;Cryotherapy;Moist Heat;DME Instruction;Gait training;Stair training;Functional mobility training;Therapeutic activities;Neuromuscular re-education;Balance training;Therapeutic exercise;Patient/family education;Manual techniques;Passive range of motion;Vestibular    PT Next Visit Plan progress VOR and standing balance    Consulted and Agree with Plan of Care Patient;Family member/caregiver    Family Member Consulted Daughter             Patient will benefit from skilled therapeutic intervention in order to improve the following deficits and impairments:  Decreased balance, Decreased activity tolerance, Decreased strength, Pain, Dizziness, Decreased knowledge of use of DME, Abnormal gait  Visit Diagnosis: Unsteadiness on feet  Dizziness and giddiness  Muscle weakness (generalized)     Problem List Patient Active Problem List   Diagnosis Date Noted   Optic atrophy of both eyes 12/10/2020   Intermediate stage nonexudative age-related macular degeneration of both eyes 05/21/2020   Primary open angle glaucoma of both eyes,  severe stage 05/21/2020   Scarring, chorioretinal, 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 Brandy Hale 08/24/2012   Hot flashes - followed by gyn, Dr. Gertie Fey 08/24/2012   Osteoarthritis of left knee 05/17/2012    Class: Diagnosis of   LOW BACK PAIN SYNDROME 05/07/2010   VITAMIN D DEFICIENCY 04/22/2009   Allergic rhinitis 03/12/2008   Hyperlipemia 10/22/2006    Janene Harvey, PT, DPT 04/16/21 3:39 PM   [facility] [address], [place], [place], [address] Phone: [phone]   Fax:  [phone]  Name: MONTINA DORRANCE MRN: [id number] Date of Birth: 1919-11-24

## 2021-04-18 ENCOUNTER — Telehealth (INDEPENDENT_AMBULATORY_CARE_PROVIDER_SITE_OTHER): Payer: Medicare PPO | Admitting: Family Medicine

## 2021-04-18 ENCOUNTER — Encounter: Payer: Self-pay | Admitting: Family Medicine

## 2021-04-18 VITALS — Ht 60.0 in | Wt 113.0 lb

## 2021-04-18 DIAGNOSIS — T887XXA Unspecified adverse effect of drug or medicament, initial encounter: Secondary | ICD-10-CM

## 2021-04-18 DIAGNOSIS — E039 Hypothyroidism, unspecified: Secondary | ICD-10-CM

## 2021-04-18 NOTE — Progress Notes (Signed)
Virtual Visit via Telephone Note  I connected with Heidi Moore  on 04/18/21 at 11:30 AM EST by telephone and verified that I am speaking with the correct person using two identifiers.   I discussed the limitations, risks, security and privacy concerns of performing an evaluation and management service by telephone and the availability of in person appointments. I also discussed with the patient that there may be a patient responsible charge related to this service. The patient expressed understanding and agreed to proceed.  Location patient: home Location provider:  [facility]  [place], [place] [address]  Participants present for the call: patient, provider Patient did not have a visit in the prior 7 days to address this/these issue(s).   Chief Complaint  Patient presents with   Follow-up   Allergic Reaction    Patient complains of allergic reaction to Levothyroxine medication, Patient reports dizziness and insomnia and increased anxiety.      History of Present Illness: Has had a hard time with thyroid pill. Tried to decrease from 67mcg down to 22mcg. Takes in the morning and feels like it makes her feel really dizzy, causes diarrhea, nausea, blurry vision, chills, sore in muscles (because of this didn't get follow up immunizations she was scheduled for), itching around body. Decreased to half tablet 12/2 which helped but still not good.   She is also concerned about getting covid booster. Son coming into town in a couple of weeks. He recently had covid and wants her to get booster.  She is doing therapy now for balance and feels that it has helped. Still has a session remaining.   Observations/Objective: Patient sounds cheerful and well on the phone. I do not appreciate any SOB. Speech and thought processing are grossly intact. Patient reported vitals: she had lost 2 pounds but states she has gained one back.   Assessment and Plan:  1. Medication  side effect She feels that thyroid medication is making her feel unwell, even with 63mcg dosing. Discussed option of doing half tab of 25 (12.31mcg) dosing versus stopping and following up on thyroid hormones. She prefers to just stop medication entirely and recheck.  We discussed that although she is hypothyroid, numbers do tend to climb with age.  I think symptoms of very mild hypothyroid for her would be better than current symptoms she is experiencing from taking thyroid medication.  2. Hypothyroidism, unspecified type See above.  We will plan to recheck thyroid enzymes and 8 to 12 weeks.  Encouraged her to complete COVID booster.  Follow Up Instructions:  Lab recheck in 8-12 weeks.  [id number] 5-10 [id number] 11-20 9443 21-30 I did not refer this patient for an OV in the next 24 hours for this/these issue(s).  I discussed the assessment and treatment plan with the patient. The patient was provided an opportunity to ask questions and all were answered. The patient agreed with the plan and demonstrated an understanding of the instructions.   The patient was advised to call back or seek an in-person evaluation if the symptoms worsen or if the condition fails to improve as anticipated.  I provided 10 minutes of non-face-to-face time during this encounter.   Micheline Rough, MD

## 2021-04-23 ENCOUNTER — Encounter: Payer: Self-pay | Admitting: Physical Therapy

## 2021-04-23 ENCOUNTER — Other Ambulatory Visit: Payer: Self-pay

## 2021-04-23 ENCOUNTER — Ambulatory Visit: Payer: Medicare PPO | Admitting: Physical Therapy

## 2021-04-23 DIAGNOSIS — R2681 Unsteadiness on feet: Secondary | ICD-10-CM | POA: Diagnosis not present

## 2021-04-23 DIAGNOSIS — M6281 Muscle weakness (generalized): Secondary | ICD-10-CM | POA: Diagnosis not present

## 2021-04-23 DIAGNOSIS — R42 Dizziness and giddiness: Secondary | ICD-10-CM

## 2021-04-23 NOTE — Patient Instructions (Addendum)

## 2021-04-23 NOTE — Therapy (Addendum)
[facility] [address] [place], [place], [address] Phone: [phone]   Fax:  [phone]  Physical Therapy Discharge Summary  Patient Details  Name: Heidi Moore MRN: [id number] Date of Birth: 04-28-1920 Referring Provider (PT): Jolene Provost, [name]  Progress Note Reporting Period 03/28/21 to 04/23/21  See note below for Objective Data and Assessment of Progress/Goals.     Encounter Date: 04/23/2021   PT End of Session - 04/23/21 1645     Visit Number 5    Number of Visits 5    Date for PT Re-Evaluation 04/25/21    Authorization Type [employer]    Authorization Time Period 5 visits approved 11/18-12/16    Progress Note Due on Visit 10    PT Start Time 1403    PT Stop Time 1444    PT Time Calculation (min) 41 min    Equipment Utilized During Treatment Gait belt    Activity Tolerance Patient tolerated treatment well    Behavior During Therapy WFL for tasks assessed/performed             Past Medical History:  Diagnosis Date   AC (acromioclavicular) joint bone spurs    on right shoulder    Allergic rhinitis    Arthritis    Bursitis    right shoulder   Complication of anesthesia    headaches   Diverticulosis    DIVERTICULOSIS, COLON 10/15/2006   Qualifier: Diagnosis of  By: Linna Darner MD, Lorretta Harp problem    treated by Dr Kathlen Mody per patient   SWHQPRFF(638.4)    sinus HA   History of blood transfusion 67yr ago   no abnormal reaction noted   History of cystitis    History of skin cancer    Basal cell, Dr. FLindwood Coke  Hyperlipidemia    Framingham Study LDL goal =<160    Hypotension    Joint pain    Joint swelling    Macular degeneration, dry    Osteopenia    Intolerance to Calcium,Actonel,Fosamax,Evista, Forteo: S/P Boniva 2007 to 03/2010   OSTEOPENIA 03/12/2008   Qualifier: Diagnosis of  By: HVelora Heckler    Osteoporosis    PONV (postoperative nausea and vomiting)    Rosacea  04/22/2009   Qualifier: Diagnosis of  By: HLinna DarnerMD, William     Skin cancer 06/22/2017   per patient after excision right calf by Dr LUbaldo Glassing  SKIN CANCER, HX OF 03/12/2008   Qualifier: Diagnosis of  By: HVelora Heckler    Urinary frequency    Vaginal atrophy    sees Dr. TGertie Fey   Past Surgical History:  Procedure Laterality Date   APPENDECTOMY     BUNIONECTOMY Bilateral    CATARACT EXTRACTION, BILATERAL     COLONOSCOPY     with Tics (no further f/u as per GI due to age)    JOINT REPLACEMENT Left    knee    KNEE ARTHROPLASTY  05/18/2012   Procedure: COMPUTER ASSISTED TOTAL KNEE ARTHROPLASTY;  Surgeon: MMarybelle Killings MD;  Location: [facility]  Service: Orthopedics;  Laterality: Left;  Left Total Knee Arthroplasty, Cemented, Computer Assisted   lt knee      meniscus tear   MOHS SURGERY     right nostril for basel cell cancer    NOSE SURGERY     due to trama at age 87   TOTAL ABDOMINAL HYSTERECTOMY W/ BILATERAL SALPINGOOPHORECTOMY  for Endometriosis    TOTAL SHOULDER ARTHROPLASTY Right 09/11/2013   Procedure: TOTAL SHOULDER ARTHROPLASTY;  Surgeon: Marybelle Killings, MD;  Location: [facility];  Service: Orthopedics;  Laterality: Right;  Right Total Shoulder Arthroplasty    There were no vitals filed for this visit.   Subjective Assessment - 04/23/21 1404     Subjective "I think I've been doing good." Reports that she is ready to wrap up and just work on her HEP.    Patient is accompained by: Family member   daughter   Pertinent History Arthritis, Bursitis, Diverticulosis, Headaches, HLD, Hypotension, Osteoporosis, Hx of skin cancer, Macular Degeneration    Currently in Pain? No/denies                [facility] PT Assessment - 04/23/21 1406       Assessment   Medical Diagnosis Imbalance    Referring Provider (PT) Jolene Provost, [name]    Onset Date/Surgical Date 02/28/21      Standardized Balance Assessment   Standardized Balance Assessment 10 meter walk test    10 Meter Walk 13.56  sec without AD, 16.57 sec with SPC   2.41 m/s without AD, 2.0 m/s with SPC     Berg Balance Test   Sit to Stand Able to stand without using hands and stabilize independently    Standing Unsupported Able to stand safely 2 minutes    Sitting with Back Unsupported but Feet Supported on Floor or Stool Able to sit safely and securely 2 minutes    Stand to Sit Sits safely with minimal use of hands    Transfers Able to transfer safely, minor use of hands    Standing Unsupported with Eyes Closed Able to stand 10 seconds safely    Standing Unsupported with Feet Together Able to place feet together independently and stand 1 minute safely    From Standing, Reach Forward with Outstretched Arm Can reach forward >12 cm safely (5")    From Standing Position, Pick up Object from Floor Able to pick up shoe safely and easily    From Standing Position, Turn to Look Behind Over each Shoulder Looks behind one side only/other side shows less weight shift    Turn 360 Degrees Able to turn 360 degrees safely in 4 seconds or less    Standing Unsupported, Alternately Place Feet on Step/Stool Able to stand independently and safely and complete 8 steps in 20 seconds    Standing Unsupported, One Foot in Front Able to plae foot ahead of the other independently and hold 30 seconds    Standing on One Leg Able to lift leg independently and hold equal to or more than 3 seconds    Total Score 51                                Balance Exercises - 04/23/21 0001       Balance Exercises: Standing   Other Standing Exercises R/L forward/back stepping 5x each    Other Standing Exercises Comments alt double toe tap on cone with CGA/1 HHA x20                PT Education - 04/23/21 1445     Education Details edu on fall prevention info at home; update to HEP- Access Code: [email or web address]; advised patient that blowing leaves is still not a safe activity for her and instead suggested to have a friend do her  yardwork    Person(s) Educated Patient;Child(ren)    Methods Explanation;Demonstration;Tactile cues;Verbal cues;Handout    Comprehension Verbalized understanding;Returned demonstration                 PT Long Term Goals - 04/23/21 1648       PT LONG TERM GOAL #1   Title Patient will be independent with vestibular/balance HEP    Baseline no HEP established    Time 4    Period Weeks    Status Achieved      PT LONG TERM GOAL #2   Title Patient will improve Berg Balance to >/= 49/56 to demo reduced fall risk and improved balance    Baseline 45/56    Time 4    Period Weeks    Status Achieved      PT LONG TERM GOAL #3   Title Patient will improve gait speed to >/= 3.0 ft/sec to demo improved community mobility    Baseline 2.72 ft/sec    Time 4    Period Weeks    Status Not Met   2.41 m/s without AD, 2.0 m/s with SPC     PT LONG TERM GOAL #4   Title Patient will report understanding of fall prevention within the home to promote improved safety    Baseline dependent    Time 4    Period Weeks    Status Achieved                   Plan - 04/23/21 1646     Clinical Impression Statement Patient arrived to session with daughter with report of doing well and feeling ready to wrap up with PT at this time. Patient scored 51/56 on Berg, indicating a decreased risk of falls. Gait speed declined slightly, particularly with use of SPC. However, patient does appear safer when using AD vs. without. Verbally reviewed HEP and updated program for max benefit. Also educated patient on fall prevention information for the home and advised patient that blowing leaves is not currently safe for her despite good improvement in her balance. Patient has progressed well towards goals and is ready for D/C at this time. No complaints at end of session.    Comorbidities Arthritis, Bursitis, Diverticulosis, Headaches, HLD, Hypotension, Osteoporosis, Hx of skin cancer, Macular Degeneration    PT  Treatment/Interventions ADLs/Self Care Home Management;Canalith Repostioning;Cryotherapy;Moist Heat;DME Instruction;Gait training;Stair training;Functional mobility training;Therapeutic activities;Neuromuscular re-education;Balance training;Therapeutic exercise;Patient/family education;Manual techniques;Passive range of motion;Vestibular    PT Next Visit Plan D/C at this time    Consulted and Agree with Plan of Care Patient;Family member/caregiver    Family Member Consulted Daughter             Patient will benefit from skilled therapeutic intervention in order to improve the following deficits and impairments:  Decreased balance, Decreased activity tolerance, Decreased strength, Pain, Dizziness, Decreased knowledge of use of DME, Abnormal gait  Visit Diagnosis: Unsteadiness on feet  Dizziness and giddiness  Muscle weakness (generalized)     Problem List Patient Active Problem List   Diagnosis Date Noted   Optic atrophy of both eyes 12/10/2020   Intermediate stage nonexudative age-related macular degeneration of both eyes 05/21/2020   Primary open angle glaucoma of both eyes, severe stage 05/21/2020   Scarring, chorioretinal, bilateral 05/21/2020   Edema 01/26/2020   Lower extremity edema 01/26/2020   Rosacea 02/19/2014   Osteoarthritis of right shoulder 09/11/2013    Class: Diagnosis of   Macular degeneration - followed by optho,  Dr. Brandy Hale 08/24/2012   Hot flashes - followed by gyn, Dr. Gertie Fey 08/24/2012   Osteoarthritis of left knee 05/17/2012    Class: Diagnosis of   LOW BACK PAIN SYNDROME 05/07/2010   VITAMIN D DEFICIENCY 04/22/2009   Allergic rhinitis 03/12/2008   Hyperlipemia 10/22/2006     Janene Harvey, PT, DPT 04/23/21 4:50 PM   [facility] [address], [place], [place], [address] Phone: [phone]   Fax:  [phone]  Name: Heidi Moore MRN: [id number] Date of Birth: 07/06/1919   PHYSICAL  THERAPY DISCHARGE SUMMARY  Visits from Start of Care: 5  Current functional level related to goals / functional outcomes: See above clinical impression    Remaining deficits: Decreased gait speed   Education / Equipment: HEP  Plan: Patient agrees to discharge.  Patient goals were partially met. Patient is being discharged due to meeting the stated rehab goals.      Janene Harvey, PT, DPT 05/14/21 1:42 PM

## 2021-04-24 NOTE — Telephone Encounter (Signed)
error 

## 2021-05-06 DIAGNOSIS — M9904 Segmental and somatic dysfunction of sacral region: Secondary | ICD-10-CM | POA: Diagnosis not present

## 2021-05-06 DIAGNOSIS — M5136 Other intervertebral disc degeneration, lumbar region: Secondary | ICD-10-CM | POA: Diagnosis not present

## 2021-05-06 DIAGNOSIS — M9903 Segmental and somatic dysfunction of lumbar region: Secondary | ICD-10-CM | POA: Diagnosis not present

## 2021-05-06 DIAGNOSIS — M9905 Segmental and somatic dysfunction of pelvic region: Secondary | ICD-10-CM | POA: Diagnosis not present

## 2021-05-28 DIAGNOSIS — M5136 Other intervertebral disc degeneration, lumbar region: Secondary | ICD-10-CM | POA: Diagnosis not present

## 2021-05-28 DIAGNOSIS — M9904 Segmental and somatic dysfunction of sacral region: Secondary | ICD-10-CM | POA: Diagnosis not present

## 2021-05-28 DIAGNOSIS — M9905 Segmental and somatic dysfunction of pelvic region: Secondary | ICD-10-CM | POA: Diagnosis not present

## 2021-05-28 DIAGNOSIS — M9903 Segmental and somatic dysfunction of lumbar region: Secondary | ICD-10-CM | POA: Diagnosis not present

## 2021-06-03 DIAGNOSIS — H401133 Primary open-angle glaucoma, bilateral, severe stage: Secondary | ICD-10-CM | POA: Diagnosis not present

## 2021-06-10 ENCOUNTER — Telehealth: Payer: Medicare PPO | Admitting: Family Medicine

## 2021-06-10 ENCOUNTER — Encounter: Payer: Self-pay | Admitting: Family Medicine

## 2021-06-10 DIAGNOSIS — R197 Diarrhea, unspecified: Secondary | ICD-10-CM

## 2021-06-10 NOTE — Progress Notes (Signed)
Virtual Visit via Telephone Note  I connected with Heidi Moore on 06/10/21 at 12:00 PM EST by telephone and verified that I am speaking with the correct person using two identifiers.   I discussed the limitations of performing an evaluation and management service by telephone and requested permission for a phone visit. The patient expressed understanding and agreed to proceed.  Location patient:  [place] Location provider: work or home office Participants present for the call: patient, provider Patient did not have a visit with me in the prior 7 days to address this/these issue(s).   History of Present Illness:  Acute telemedicine visit for diarrhea: -Onset: a few days ago -Symptoms include:loose to watery diarrhea a few times - after eating cheese  -Denies:fevers, abd pain, melena, hematochezia, vomiting -Pertinent past medical history: see below -Pertinent medication allergies:  Allergies  Allergen Reactions   Alendronate Sodium Nausea And Vomiting   Anaplex Hd Nausea And Vomiting   Aspirin Nausea And Vomiting   Azithromycin Nausea And Vomiting   Calcium Nausea And Vomiting   Ciprofloxacin     Patient states this caused nausea, dizziness and affected her muscles   Codeine Nausea And Vomiting   Dorzolamide Hcl-Timolol Mal Other (See Comments)   Doxycycline Nausea And Vomiting   Gabapentin Other (See Comments)    Dizziness, Joint pain, blurred vision, changes in eyesight   Hydrocodone Nausea And Vomiting   Ibandronate Sodium     REACTION: severe body aches   Ibuprofen Nausea And Vomiting   Penicillins Nausea And Vomiting   Raloxifene Nausea And Vomiting   Risedronate Sodium Nausea And Vomiting   Sulfonamide Derivatives Nausea And Vomiting   Teriparatide Nausea And Vomiting   Travoprost   -COVID-19 vaccine status: Immunization History  Administered Date(s) Administered   Influenza Split 02/13/2013   Influenza Whole 01/31/2008   Influenza, High Dose Seasonal PF 01/31/2016,  02/07/2017, 02/16/2018, 01/21/2019   Influenza-Unspecified 01/09/2014, 01/24/2017, 02/09/2018, 01/22/2020, 02/08/2021   PFIZER(Purple Top)SARS-COV-2 Vaccination 06/15/2019, 08/07/2019, 04/11/2020   Pneumococcal Polysaccharide-23 05/11/1996   Td 05/11/1994   Tdap 10/30/2013   Zoster Recombinat (Shingrix) 03/22/2021   Zoster, Live 11/04/2010      Past Medical History:  Diagnosis Date   AC (acromioclavicular) joint bone spurs    on right shoulder    Allergic rhinitis    Arthritis    Bursitis    right shoulder   Complication of anesthesia    headaches   Diverticulosis    DIVERTICULOSIS, COLON 10/15/2006   Qualifier: Diagnosis of  By: Linna Darner MD, Lorretta Harp problem    treated by Dr Kathlen Mody per patient   [id number](673.4)    sinus HA   History of blood transfusion 20yrs ago   no abnormal reaction noted   History of cystitis    History of skin cancer    Basal cell, Dr. Lindwood Coke   Hyperlipidemia    Framingham Study LDL goal =<160    Hypotension    Joint pain    Joint swelling    Macular degeneration, dry    Osteopenia    Intolerance to Calcium,Actonel,Fosamax,Evista, Forteo: S/P Boniva 2007 to 03/2010   OSTEOPENIA 03/12/2008   Qualifier: Diagnosis of  By: Velora Heckler     Osteoporosis    PONV (postoperative nausea and vomiting)    Rosacea 04/22/2009   Qualifier: Diagnosis of  By: Linna Darner MD, William     Skin cancer 06/22/2017   per patient after excision right calf by Dr Ubaldo Glassing  SKIN CANCER, HX OF 03/12/2008   Qualifier: Diagnosis of  By: Velora Heckler     Urinary frequency    Vaginal atrophy    sees Dr. Gertie Fey    Current Outpatient Medications on File Prior to Visit  Medication Sig Dispense Refill   acetaminophen (TYLENOL) 325 MG tablet Take 650 mg by mouth every 6 (six) hours as needed for mild pain or headache.     Ascorbic Acid (VITAMIN C PO) Take 500 mg by mouth 2 (two) times daily.     B Complex-C (B-COMPLEX WITH VITAMIN C) tablet Take 1 tablet by mouth  daily.     cholecalciferol (VITAMIN D) 1000 UNITS tablet Take 1,000 Units by mouth 2 (two) times daily.      conjugated estrogens (PREMARIN) vaginal cream Place 1 Applicatorful vaginally daily. 42.5 g 12   estradiol (ESTRACE) 0.5 MG tablet Take 0.5 mg by mouth daily.      neomycin-colistin-hydrocortisone-thonzonium (CORTISPORIN-TC) 3.07-11-08-0.5 MG/ML OTIC suspension Place 3 drops into both ears 3 (three) times daily as needed (itching). 10 mL 0   OVER THE COUNTER MEDICATION Take 1 capsule by mouth 2 (two) times daily. [place]7 daily.     OVER THE COUNTER MEDICATION 2 (two) times daily. Preservision vitamin for eyes     vitamin E 400 UNIT capsule Take 1 capsule by mouth daily.     No current facility-administered medications on file prior to visit.    Observations/Objective: Patient sounds cheerful and well on the phone. I do not appreciate any SOB. Speech and thought processing are grossly intact. Patient reported vitals:  Assessment and Plan:  Diarrhea, unspecified type  -we discussed possible serious and likely etiologies, options for evaluation and workup, limitations of telemedicine visit vs in person visit, treatment, treatment risks and precautions. Pt prefers to treat via telemedicine empirically rather than in person at this moment. She is doing ok now. Advised to avoid dairy products if she can and could try imodium if needed. Advised to monitor weight and follow up with PCP if persistent symptoms, weight loss, fevers, bleeding or if is worsening, or not improving with treatment as expected per our conversation of expected course. Discussed options for follow up care. Did let this patient know that I do telemedicine on Tuesdays and Thursdays for [place] and those are the days I am logged into the system. Advised to schedule follow up visit with PCP, [facility] [place] virtual visits or [facility] if any further questions or concerns to avoid delays in care.   I discussed the  assessment and treatment plan with the patient. The patient was provided an opportunity to ask questions and all were answered. The patient agreed with the plan and demonstrated an understanding of the instructions.    Follow Up Instructions:  I did not refer this patient for an OV with me in the next 24 hours for this/these issue(s).  I discussed the assessment and treatment plan with the patient. The patient was provided an opportunity to ask questions and all were answered. The patient agreed with the plan and demonstrated an understanding of the instructions.   I spent 15 minutes on the date of this visit in the care of this patient. See summary of tasks completed to properly care for this patient in the detailed notes above which also included counseling of above, review of PMH, medications, allergies, evaluation of the patient and ordering and/or  instructing patient on testing and care options.     Lucretia Kern,  DO

## 2021-06-10 NOTE — Patient Instructions (Signed)
-  avoid dairy products  -monitor weight once per week and keep a log  -imodium if needed if any further diarrhea  I hope you are feeling better soon!  Seek in person care promptly if your symptoms worsen, new concerns arise or you are not improving with treatment.  It was nice to meet you today. I help [facility] out with telemedicine visits on Tuesdays and Thursdays and am happy to help if you need a virtual follow up visit on those days. Otherwise, if you have any concerns or questions following this visit please schedule a follow up visit with your Primary Care office or seek care at a local urgent care clinic to avoid delays in care

## 2021-06-16 DIAGNOSIS — M5136 Other intervertebral disc degeneration, lumbar region: Secondary | ICD-10-CM | POA: Diagnosis not present

## 2021-06-16 DIAGNOSIS — M9905 Segmental and somatic dysfunction of pelvic region: Secondary | ICD-10-CM | POA: Diagnosis not present

## 2021-06-16 DIAGNOSIS — M9904 Segmental and somatic dysfunction of sacral region: Secondary | ICD-10-CM | POA: Diagnosis not present

## 2021-06-16 DIAGNOSIS — M9903 Segmental and somatic dysfunction of lumbar region: Secondary | ICD-10-CM | POA: Diagnosis not present

## 2021-06-26 ENCOUNTER — Telehealth: Payer: Self-pay | Admitting: Family Medicine

## 2021-06-26 NOTE — Telephone Encounter (Signed)
Left message for patient to call back and schedule Medicare Annual Wellness Visit (AWV) either virtually or in office. Left  my Heidi Moore number [phone]   Last AWV 03/06/20 please schedule at anytime with [facility] Nurse Health Advisor 1 or 2   This should be a 45 minute visit.

## 2021-07-01 DIAGNOSIS — M5136 Other intervertebral disc degeneration, lumbar region: Secondary | ICD-10-CM | POA: Diagnosis not present

## 2021-07-01 DIAGNOSIS — M9904 Segmental and somatic dysfunction of sacral region: Secondary | ICD-10-CM | POA: Diagnosis not present

## 2021-07-01 DIAGNOSIS — M9903 Segmental and somatic dysfunction of lumbar region: Secondary | ICD-10-CM | POA: Diagnosis not present

## 2021-07-01 DIAGNOSIS — M9905 Segmental and somatic dysfunction of pelvic region: Secondary | ICD-10-CM | POA: Diagnosis not present

## 2021-07-08 ENCOUNTER — Ambulatory Visit (INDEPENDENT_AMBULATORY_CARE_PROVIDER_SITE_OTHER): Payer: Medicare PPO

## 2021-07-08 VITALS — Ht 62.0 in | Wt 108.0 lb

## 2021-07-08 DIAGNOSIS — Z0000 Encounter for general adult medical examination without abnormal findings: Secondary | ICD-10-CM

## 2021-07-08 NOTE — Progress Notes (Signed)
Subjective:   Heidi Moore is a 88 y.o. female who presents for Medicare Annual (Subsequent) preventive examination.  Review of Systems    Virtual Visit via Telephone Note  I connected with  Heidi Moore on 07/08/21 at 11:15 AM EST by telephone and verified that I am speaking with the correct person using two identifiers.  Location: Patient: Home Provider: Office Persons participating in the virtual visit: patient/Nurse Health Advisor   I discussed the limitations, risks, security and privacy concerns of performing an evaluation and management service by telephone and the availability of in person appointments. The patient expressed understanding and agreed to proceed.  Interactive audio and video telecommunications were attempted between this nurse and patient, however failed, due to patient having technical difficulties OR patient did not have access to video capability.  We continued and completed visit with audio only.  Some vital signs may be absent or patient reported.   Heidi Peaches, LPN  Cardiac Risk Factors include: advanced age (>66men, >53 women)     Objective:    Today's Vitals   07/08/21 1109  Weight: 108 lb (49 kg)  Height: 5\' 2"  (1.575 m)   Body mass index is 19.75 kg/m.  Advanced Directives 07/08/2021 04/19/2020 03/06/2020 04/05/2018 03/25/2017 11/06/2014 10/30/2013  Does Patient Have a Medical Advance Directive? Yes Yes Yes Yes Yes Yes Patient has advance directive, copy not in chart  Type of Advance Directive [place];Living will [place];Living will [place];Living will - - [place];Living will [place]  Does patient want to make changes to medical advance directive? No - Patient declined - No - Patient declined - - No - Patient declined -  Copy of [place] in Chart? No - copy requested - Yes - validated most recent copy scanned in  chart (See row information) - - Yes Copy requested from family  Pre-existing out of facility DNR order (yellow form or pink MOST form) - - - - - - -    Current Medications (verified) Outpatient Encounter Medications as of 07/08/2021  Medication Sig   acetaminophen (TYLENOL) 325 MG tablet Take 650 mg by mouth every 6 (six) hours as needed for mild pain or headache.   Ascorbic Acid (VITAMIN C PO) Take 500 mg by mouth 2 (two) times daily.   B Complex-C (B-COMPLEX WITH VITAMIN C) tablet Take 1 tablet by mouth daily.   cholecalciferol (VITAMIN D) 1000 UNITS tablet Take 1,000 Units by mouth 2 (two) times daily.    conjugated estrogens (PREMARIN) vaginal cream Place 1 Applicatorful vaginally daily.   estradiol (ESTRACE) 0.5 MG tablet Take 0.5 mg by mouth daily.    neomycin-colistin-hydrocortisone-thonzonium (CORTISPORIN-TC) 3.07-11-08-0.5 MG/ML OTIC suspension Place 3 drops into both ears 3 (three) times daily as needed (itching).   OVER THE COUNTER MEDICATION Take 1 capsule by mouth 2 (two) times daily. [place]7 daily.   OVER THE COUNTER MEDICATION 2 (two) times daily. Preservision vitamin for eyes   vitamin E 400 UNIT capsule Take 1 capsule by mouth daily.   No facility-administered encounter medications on file as of 07/08/2021.    Allergies (verified) Alendronate sodium, Anaplex hd, Aspirin, Azithromycin, Calcium, Ciprofloxacin, Codeine, Dorzolamide hcl-timolol mal, Doxycycline, Gabapentin, Hydrocodone, Ibandronate sodium, Ibuprofen, Penicillins, Raloxifene, Risedronate sodium, Sulfonamide derivatives, Teriparatide, and Travoprost   History: Past Medical History:  Diagnosis Date   AC (acromioclavicular) joint bone spurs    on right shoulder  Allergic rhinitis    Arthritis    Bursitis    right shoulder   Complication of anesthesia    headaches   Diverticulosis    DIVERTICULOSIS, COLON 10/15/2006   Qualifier: Diagnosis of  By: Linna Darner MD, Lorretta Harp problem    treated  by Dr Kathlen Mody per patient   ZGYFVCBS(496.7)    sinus HA   History of blood transfusion 57yrs ago   no abnormal reaction noted   History of cystitis    History of skin cancer    Basal cell, Dr. Lindwood Coke   Hyperlipidemia    [place] Study LDL goal =<160    Hypotension    Joint pain    Joint swelling    Macular degeneration, dry    Osteopenia    Intolerance to Calcium,Actonel,Fosamax,Evista, Forteo: S/P [place] 2007 to 03/2010   OSTEOPENIA 03/12/2008   Qualifier: Diagnosis of  By: Velora Heckler     Osteoporosis    PONV (postoperative nausea and vomiting)    Rosacea 04/22/2009   Qualifier: Diagnosis of  By: Linna Darner MD, William     Skin cancer 06/22/2017   per patient after excision right calf by Dr Ubaldo Glassing   SKIN CANCER, HX OF 03/12/2008   Qualifier: Diagnosis of  By: Velora Heckler     Urinary frequency    Vaginal atrophy    sees Dr. Gertie Fey   Past Surgical History:  Procedure Laterality Date   APPENDECTOMY     BUNIONECTOMY Bilateral    CATARACT EXTRACTION, BILATERAL     COLONOSCOPY     with Tics (no further f/u as per GI due to age)    JOINT REPLACEMENT Left    knee    KNEE ARTHROPLASTY  05/18/2012   Procedure: COMPUTER ASSISTED TOTAL KNEE ARTHROPLASTY;  Surgeon: Marybelle Killings, MD;  Location: [place];  Service: Orthopedics;  Laterality: Left;  Left Total Knee Arthroplasty, Cemented, Computer Assisted   lt knee      meniscus tear   MOHS SURGERY     right nostril for basel cell cancer    NOSE SURGERY     due to trama at age 89    TOTAL ABDOMINAL HYSTERECTOMY W/ BILATERAL SALPINGOOPHORECTOMY     for Endometriosis    TOTAL SHOULDER ARTHROPLASTY Right 09/11/2013   Procedure: TOTAL SHOULDER ARTHROPLASTY;  Surgeon: Marybelle Killings, MD;  Location: [place];  Service: Orthopedics;  Laterality: Right;  Right Total Shoulder Arthroplasty   Family History  Problem Relation Age of Onset   Colon cancer Other        88st Cousin    Stroke Mother 44   Diabetes Brother    Atrial fibrillation  Brother        coumadin   Lung cancer Sister    Social History   Socioeconomic History   Marital status: Widowed    Spouse name: Not on file   Number of children: 3   Years of education: Not on file   Highest education level: Some college, no degree  Occupational History    Employer: RETIRED  Tobacco Use   Smoking status: Never   Smokeless tobacco: Never  Vaping Use   Vaping Use: Never used  Substance and Sexual Activity   Alcohol use: No   Drug use: No   Sexual activity: Not on file  Other Topics Concern   Not on file  Social History Narrative   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 She lives alone. She goes to the gym and participates in aerobics 3 x week. She drinks 3 cups of coffee a day.         Social Determinants of Health   Financial Resource Strain: Low Risk    Difficulty of Paying Living Expenses: Not hard at all  Food Insecurity: No Food Insecurity   Worried About Charity fundraiser in the Last Year: Never true   [place] in the Last Year: Never true  Transportation Needs: No Transportation Needs   Lack of Transportation (Medical): No   Lack of Transportation (Non-Medical): No  Physical Activity: Insufficiently Active   Days of Exercise per Week: 2 days   Minutes of Exercise per Session: 30 min  Stress: No Stress Concern Present   Feeling of Stress : Not at all  Social Connections: Moderately Integrated   Frequency of Communication with Friends and Family: More than three times a week   Frequency of Social Gatherings with  Friends and Family: More than three times a week   Attends Religious Services: More than 4 times per year   Active Member of [employer] or Organizations: Yes   Attends Archivist Meetings: More than 4 times per year   Marital Status: Widowed      Clinical Intake: How often do you need to have someone help you when you read instructions, pamphlets, or other written materials from your doctor or pharmacy?: 1 - Never  Diabetic?  No  Activities of Daily Living In your present state of health, do you have any difficulty performing the following activities: 07/08/2021  Hearing? N  Vision? N  Difficulty concentrating or making decisions? N  Walking or climbing stairs? N  Dressing or bathing? N  Doing errands, shopping? N  Preparing Food and eating ? N  Using the Toilet? N  In the past six months, have you accidently leaked urine? Y  Comment Wears pads  Do you have problems with loss of bowel control? N  Managing your Medications? N  Managing your Finances? N  Housekeeping or managing your Housekeeping? N  Some recent data might be hidden    Patient Care Team: Caren Macadam, MD as PCP - General (Family Medicine) Rolm Bookbinder, MD as Consulting Physician (Dermatology)  Indicate any recent Medical Services you may have received from other than [facility] providers in the past year (date may be approximate).     Assessment:   This is a routine wellness examination for Zaynab.  Hearing/Vision screen Hearing Screening - Comments:: No difficulty hearing Vision Screening - Comments:: Wears glasses. Followed by Dr Satira Sark  Dietary issues and exercise activities discussed: Exercise limited by: None identified   Goals Addressed               This Visit's Progress     Exercise 150 min/wk Moderate Activity (pt-stated)        Exercising per Dr. Lorin [name] recommendation to strengthen her quads Continue with yard work.  Depression Screen PHQ 2/9 Scores 07/08/2021 04/18/2021  03/06/2020 04/11/2019 04/05/2018 10/05/2017 03/25/2017  PHQ - 2 Score 0 - 0 0 0 0 0  PHQ- 9 Score - - 0 - - - -  Exception Documentation - Other- indicate reason in comment box - - - - -    Fall Risk Fall Risk  07/08/2021 04/19/2020 03/06/2020 08/28/2019 04/11/2019  Falls in the past year? 0 0 0 1 0  Comment - - - - -  Number falls in past yr: 0 0 0 1 0  Comment - - - - -  Injury with Fall? 0 0 0 0 0  Comment - - - pt has been sore since then -  Risk for fall due to : No Fall Risks - Impaired balance/gait - History of fall(s)  Follow up - - Falls evaluation completed;Falls prevention discussed - Falls evaluation completed;Education provided;Falls prevention discussed;Follow up appointment  Comment - - - - -    FALL RISK PREVENTION PERTAINING TO THE HOME:  Any stairs in or around the home? Yes  If so, are there any without handrails? No  Home free of loose throw rugs in walkways, pet beds, electrical cords, etc? Yes  Adequate lighting in your home to reduce risk of falls? Yes   ASSISTIVE DEVICES UTILIZED TO PREVENT FALLS:  Life alert? Yes  Use of a cane, walker or w/c? Yes  Grab bars in the bathroom? Yes  Shower chair or bench in shower? Yes  Elevated toilet seat or a handicapped toilet? Yes   TIMED UP AND GO:  Was the test performed? No . Audio Visit   Cognitive Function: MMSE - Mini Mental State Exam 04/05/2018 03/25/2017  Not completed: (No Data) (No Data)     6CIT Screen 07/08/2021  What Year? 0 points  What month? 0 points  What time? 0 points  Count back from 20 2 points  Months in reverse 0 points  Repeat phrase 0 points  Total Score 2    Immunizations Immunization History  Administered Date(s) Administered   Influenza Split 02/13/2013   Influenza Whole 01/31/2008   Influenza, High Dose Seasonal PF 01/31/2016, 02/07/2017, 02/16/2018, 01/21/2019   Influenza-Unspecified 01/09/2014, 01/24/2017, 02/09/2018, 01/22/2020, 02/08/2021   PFIZER(Purple Top)SARS-COV-2  Vaccination 06/15/2019, 08/07/2019, 04/11/2020   Pneumococcal Polysaccharide-23 05/11/1996   Td 05/11/1994   Tdap 10/30/2013   Zoster Recombinat (Shingrix) 03/19/2021, 03/22/2021   Zoster, Live 11/04/2010    TDAP status: Up to date  Flu Vaccine status: Up to date  Pneumococcal vaccine status: Up to date  Covid-19 vaccine status: Completed vaccines  Qualifies for Shingles Vaccine? Yes   Zostavax completed Yes   Screening Tests Health Maintenance  Topic Date Due   Pneumonia Vaccine 9+ Years old (2 - PCV) 05/11/1997   COVID-19 Vaccine (4 - Booster for Pfizer series) 06/06/2020   Zoster Vaccines- Shingrix (2 of 2) 05/17/2021   TETANUS/TDAP  10/31/2023   INFLUENZA VACCINE  Completed   DEXA SCAN  Completed   HPV VACCINES  Aged Out    Health Maintenance  Health Maintenance Due  Topic Date Due   Pneumonia Vaccine 71+ Years old (2 - PCV) 05/11/1997   COVID-19 Vaccine (4 - Booster for Pfizer series) 06/06/2020   Zoster Vaccines- Shingrix (2 of 2) 05/17/2021    Colorectal cancer screening: No longer required.   Mammogram status: No longer required due to Age.  Bone Density status: Completed 04/01/20. Results reflect: Bone density results: OSTEOPOROSIS. Repeat every 2 years.  Lung  Cancer Screening: (Low Dose CT Chest recommended if Age 53-80 years, 30 pack-year currently smoking OR have quit w/in 15years.) does not qualify.  Additional Screening:  Hepatitis C Screening: does not qualify; Completed   Vision Screening: Recommended annual ophthalmology exams for early detection of glaucoma and other disorders of the eye. Is the patient up to date with their annual eye exam?  Yes  Who is the provider or what is the name of the office in which the patient attends annual eye exams? Dr Satira Sark If pt is not established with a provider, would they like to be referred to a provider to establish care? No .   Dental Screening: Recommended annual dental exams for proper oral  hygiene  Community Resource Referral / Chronic Care Management:  CRR required this visit?  No   CCM required this visit?  No      Plan:     I have personally reviewed and noted the following in the patients chart:   Medical and social history Use of alcohol, tobacco or illicit drugs  Current medications and supplements including opioid prescriptions.  Functional ability and status Nutritional status Physical activity Advanced directives List of other physicians Hospitalizations, surgeries, and ER visits in previous 12 months Vitals Screenings to include cognitive, depression, and falls Referrals and appointments  In addition, I have reviewed and discussed with patient certain preventive protocols, quality metrics, and best practice recommendations. A written personalized care plan for preventive services as well as general preventive health recommendations were provided to patient.     Heidi Peaches, LPN   12/19/313   Nurse Notes: None

## 2021-07-08 NOTE — Patient Instructions (Addendum)
Heidi Moore , Thank you for taking time to come for your Medicare Wellness Visit. I appreciate your ongoing commitment to your health goals. Please review the following plan we discussed and let me know if I can assist you in the future.   These are the goals we discussed:  Goals       Exercise 150 min/wk Moderate Activity (pt-stated)      Exercising per Dr. Lorin [name] recommendation to strengthen her quads Continue with yard work.        This is a list of the screening recommended for you and due dates:  Health Maintenance  Topic Date Due   Pneumonia Vaccine (2 - PCV) 05/11/1997   COVID-19 Vaccine (4 - Booster for Pfizer series) 06/06/2020   Zoster (Shingles) Vaccine (2 of 2) 05/17/2021   Tetanus Vaccine  10/31/2023   Flu Shot  Completed   DEXA scan (bone density measurement)  Completed   HPV Vaccine  Aged Out    Advanced directives: Yes  Conditions/risks identified: None  Next appointment: Follow up in one year for your annual wellness visit    Preventive Care 65 Years and Older, Female Preventive care refers to lifestyle choices and visits with your health care provider that can promote health and wellness. What does preventive care include? A yearly physical exam. This is also called an annual well check. Dental exams once or twice a year. Routine eye exams. Ask your health care provider how often you should have your eyes checked. Personal lifestyle choices, including: Daily care of your teeth and gums. Regular physical activity. Eating a healthy diet. Avoiding tobacco and drug use. Limiting alcohol use. Practicing safe sex. Taking low-dose aspirin every day. Taking vitamin and mineral supplements as recommended by your health care provider. What happens during an annual well check? The services and screenings done by your health care provider during your annual well check will depend on your age, overall health, lifestyle risk factors, and family history of  disease. Counseling  Your health care provider may ask you questions about your: Alcohol use. Tobacco use. Drug use. Emotional well-being. Home and relationship well-being. Sexual activity. Eating habits. History of falls. Memory and ability to understand (cognition). Work and work Statistician. Reproductive health. Screening  You may have the following tests or measurements: Height, weight, and BMI. Blood pressure. Lipid and cholesterol levels. These may be checked every 5 years, or more frequently if you are over 22 years old. Skin check. Lung cancer screening. You may have this screening every year starting at age 53 if you have a 30-pack-year history of smoking and currently smoke or have quit within the past 15 years. Fecal occult blood test (FOBT) of the stool. You may have this test every year starting at age 36. Flexible sigmoidoscopy or colonoscopy. You may have a sigmoidoscopy every 5 years or a colonoscopy every 10 years starting at age 29. Hepatitis C blood test. Hepatitis B blood test. Sexually transmitted disease (STD) testing. Diabetes screening. This is done by checking your blood sugar (glucose) after you have not eaten for a while (fasting). You may have this done every 1-3 years. Bone density scan. This is done to screen for osteoporosis. You may have this done starting at age 34. Mammogram. This may be done every 1-2 years. Talk to your health care provider about how often you should have regular mammograms. Talk with your health care provider about your test results, treatment options, and if necessary, the need for more tests.  Vaccines  Your health care provider may recommend certain vaccines, such as: Influenza vaccine. This is recommended every year. Tetanus, diphtheria, and acellular pertussis (Tdap, Td) vaccine. You may need a Td booster every 10 years. Zoster vaccine. You may need this after age 45. Pneumococcal 13-valent conjugate (PCV13) vaccine. One  dose is recommended after age 9. Pneumococcal polysaccharide (PPSV23) vaccine. One dose is recommended after age 70. Talk to your health care provider about which screenings and vaccines you need and how often you need them. This information is not intended to replace advice given to you by your health care provider. Make sure you discuss any questions you have with your health care provider. Document Released: 05/24/2015 Document Revised: 01/15/2016 Document Reviewed: 02/26/2015 Elsevier Interactive Patient Education  2017 [place] Prevention in the Home Falls can cause injuries. They can happen to people of all ages. There are many things you can do to make your home safe and to help prevent falls. What can I do on the outside of my home? Regularly fix the edges of walkways and driveways and fix any cracks. Remove anything that might make you trip as you walk through a door, such as a raised step or threshold. Trim any bushes or trees on the path to your home. Use bright outdoor lighting. Clear any walking paths of anything that might make someone trip, such as rocks or tools. Regularly check to see if handrails are loose or broken. Make sure that both sides of any steps have handrails. Any raised decks and porches should have guardrails on the edges. Have any leaves, snow, or ice cleared regularly. Use sand or salt on walking paths during winter. Clean up any spills in your garage right away. This includes oil or grease spills. What can I do in the bathroom? Use night lights. Install grab bars by the toilet and in the tub and shower. Do not use towel bars as grab bars. Use non-skid mats or decals in the tub or shower. If you need to sit down in the shower, use a plastic, non-slip stool. Keep the floor dry. Clean up any water that spills on the floor as soon as it happens. Remove soap buildup in the tub or shower regularly. Attach bath mats securely with double-sided  non-slip rug tape. Do not have throw rugs and other things on the floor that can make you trip. What can I do in the bedroom? Use night lights. Make sure that you have a light by your bed that is easy to reach. Do not use any sheets or blankets that are too big for your bed. They should not hang down onto the floor. Have a firm chair that has side arms. You can use this for support while you get dressed. Do not have throw rugs and other things on the floor that can make you trip. What can I do in the kitchen? Clean up any spills right away. Avoid walking on wet floors. Keep items that you use a lot in easy-to-reach places. If you need to reach something above you, use a strong step stool that has a grab bar. Keep electrical cords out of the way. Do not use floor polish or wax that makes floors slippery. If you must use wax, use non-skid floor wax. Do not have throw rugs and other things on the floor that can make you trip. What can I do with my stairs? Do not leave any items on the stairs. Make sure that  there are handrails on both sides of the stairs and use them. Fix handrails that are broken or loose. Make sure that handrails are as long as the stairways. Check any carpeting to make sure that it is firmly attached to the stairs. Fix any carpet that is loose or worn. Avoid having throw rugs at the top or bottom of the stairs. If you do have throw rugs, attach them to the floor with carpet tape. Make sure that you have a light switch at the top of the stairs and the bottom of the stairs. If you do not have them, ask someone to add them for you. What else can I do to help prevent falls? Wear shoes that: Do not have high heels. Have rubber bottoms. Are comfortable and fit you well. Are closed at the toe. Do not wear sandals. If you use a stepladder: Make sure that it is fully opened. Do not climb a closed stepladder. Make sure that both sides of the stepladder are locked into place. Ask  someone to hold it for you, if possible. Clearly mark and make sure that you can see: Any grab bars or handrails. First and last steps. Where the edge of each step is. Use tools that help you move around (mobility aids) if they are needed. These include: Canes. Walkers. Scooters. Crutches. Turn on the lights when you go into a dark area. Replace any light bulbs as soon as they burn out. Set up your furniture so you have a clear path. Avoid moving your furniture around. If any of your floors are uneven, fix them. If there are any pets around you, be aware of where they are. Review your medicines with your doctor. Some medicines can make you feel dizzy. This can increase your chance of falling. Ask your doctor what other things that you can do to help prevent falls. This information is not intended to replace advice given to you by your health care provider. Make sure you discuss any questions you have with your health care provider. Document Released: 02/21/2009 Document Revised: 10/03/2015 Document Reviewed: 06/01/2014 Elsevier Interactive Patient Education  2017 [employer].

## 2021-07-09 DIAGNOSIS — H401133 Primary open-angle glaucoma, bilateral, severe stage: Secondary | ICD-10-CM | POA: Diagnosis not present

## 2021-07-29 DIAGNOSIS — M5136 Other intervertebral disc degeneration, lumbar region: Secondary | ICD-10-CM | POA: Diagnosis not present

## 2021-07-29 DIAGNOSIS — M9901 Segmental and somatic dysfunction of cervical region: Secondary | ICD-10-CM | POA: Diagnosis not present

## 2021-07-29 DIAGNOSIS — M50322 Other cervical disc degeneration at C5-C6 level: Secondary | ICD-10-CM | POA: Diagnosis not present

## 2021-07-29 DIAGNOSIS — M9903 Segmental and somatic dysfunction of lumbar region: Secondary | ICD-10-CM | POA: Diagnosis not present

## 2021-08-20 ENCOUNTER — Ambulatory Visit: Payer: Medicare PPO | Admitting: Allergy

## 2021-08-20 ENCOUNTER — Encounter: Payer: Self-pay | Admitting: Allergy

## 2021-08-20 VITALS — BP 126/80 | HR 82 | Temp 98.3°F | Resp 12 | Ht 61.81 in | Wt 109.4 lb

## 2021-08-20 DIAGNOSIS — H1013 Acute atopic conjunctivitis, bilateral: Secondary | ICD-10-CM

## 2021-08-20 DIAGNOSIS — J3089 Other allergic rhinitis: Secondary | ICD-10-CM

## 2021-08-20 MED ORDER — TRIAMCINOLONE ACETONIDE 55 MCG/ACT NA AERO: 1.0000 | INHALATION_SPRAY | Freq: Every day | NASAL | 5 refills | Status: DC

## 2021-08-20 MED ORDER — ALLEGRA ALLERGY CHILDRENS 30 MG/5ML PO SUSP: ORAL | 5 refills | Status: AC

## 2021-08-20 MED ORDER — AZELASTINE HCL 0.1 % NA SOLN: 2.0000 | Freq: Two times a day (BID) | NASAL | 5 refills | Status: DC | PRN

## 2021-08-20 NOTE — Patient Instructions (Signed)
-   Allergy testing from 2020 showed: indoor molds and outdoor molds. ?Allergen avoidance measures for mold provided.   ?- Continue with: Allegra (fexofenadine) 1/4 tsp once daily ?Astelin (azelastine) 2 sprays per nostril 1-2 times daily as needed for nasal drainage ?- Start taking: Nasacort (triamcinolone) one spray per nostril daily (AIM FOR EAR ON EACH SIDE) as needed for nasal congestion, sinus pressure, headache control ?- Recommend you see your dermatologist regarding the brown spots on the legs as this is not an allergy issue ?- Continue to avoid the medications you are reactive too to prevent symptoms ? ?Follow-up as needed ? ?

## 2021-08-20 NOTE — Progress Notes (Signed)
? ? ?New Patient Note ? ?RE: Heidi Moore MRN: [id number] DOB: [birth year] ?Date of Office Visit: 08/20/2021 ? ?Primary care provider: Caren Macadam, MD ? ?Chief Complaint: allergies ? ?History of present illness: ?Heidi Moore is a 88 y.o. female presenting today for evaluation of allergies.   ? ?She was seen by Dr. Fredderick Phenix at [facility] allergy and had skin testing done in 2020 that was positive on intradermals to mold.   ?She states after having this testing done she seem to have a lot of issues develop with her allergies.  She is not interested in further skin testing at this time.  She is wondering if the testing caused her immune symptoms to be overactive.   ?She reports seasonal allergies worse in the fall since childhood.   ?She would take antihistamines during fall in the past for these symptoms.   ?She states now with fresh mowed grass it causes symptoms.  ?This spring she states her allergy symptoms have been bad.  ?She reports itchy ears and she has used a white vinegar concoction to the ears before.   ?She has itchy eyes, sneezing, runny nose, forehead headaches. ?She has used astepro that she reports she was using it for nasal congestion.   ?She has use liquid allegra at children's dosing as needed (taking 1/4th of tsp) which seems to calm down her allergy symptoms.   ?She also reports a burning sensation of her skin.  She states she has been to several other specialists regarding this.   ?She also states she has developed these brown spots on her legs that she does not know what caused it. She is wondering if they are liver spots and if she needs the liver evaluated.   ?She states she has been told there is nothing wrong with her heart.    ?She reports reaction to penicillin with itch and burning sensation.  ?She has a large medication allergy list as in the EMR.   ? ?Review of systems: ?Review of Systems  ?Constitutional: Negative.   ?HENT:    ?     See HPI  ?Eyes:   ?     See HPI   ?Respiratory: Negative.    ?Cardiovascular: Negative.   ?Gastrointestinal: Negative.   ?Musculoskeletal: Negative.   ?Skin:   ?     See HPI  ?Allergic/Immunologic: Negative.   ?Neurological: Negative.   ? ?All other systems negative unless noted above in HPI ? ?Past medical history: ?Past Medical History:  ?Diagnosis Date  ? AC (acromioclavicular) joint bone spurs   ? on right shoulder   ? Allergic rhinitis   ? Arthritis   ? Bursitis   ? right shoulder  ? Complication of anesthesia   ? headaches  ? Diverticulosis   ? DIVERTICULOSIS, COLON 10/15/2006  ? Qualifier: Diagnosis of  By: Linna Darner MD, Gwyndolyn Saxon    ? Eye problem   ? treated by Dr Kathlen Mody per patient  ? Headache(784.0)   ? sinus HA  ? History of blood transfusion 35yr ago  ? no abnormal reaction noted  ? History of cystitis   ? History of skin cancer   ? Basal cell, Dr. FLindwood Coke ? Hyperlipidemia   ? Framingham Study LDL goal =<160   ? Hypotension   ? Joint pain   ? Joint swelling   ? Macular degeneration, dry   ? Osteopenia   ? Intolerance to [place]: S/P [place] 2007 to 03/2010  ? OSTEOPENIA 03/12/2008  ?  Qualifier: Diagnosis of  By: Velora Heckler    ? Osteoporosis   ? PONV (postoperative nausea and vomiting)   ? Rosacea 04/22/2009  ? Qualifier: Diagnosis of  By: Linna Darner MD, William    ? Skin cancer 06/22/2017  ? per patient after excision right calf by Dr Ubaldo Glassing  ? SKIN CANCER, HX OF 03/12/2008  ? Qualifier: Diagnosis of  By: Velora Heckler    ? Urinary frequency   ? Vaginal atrophy   ? sees Dr. Gertie Fey  ? ? ?Past surgical history: ?Past Surgical History:  ?Procedure Laterality Date  ? APPENDECTOMY    ? BUNIONECTOMY Bilateral   ? CATARACT EXTRACTION, BILATERAL    ? COLONOSCOPY    ? with Tics (no further f/u as per GI due to age)   ? JOINT REPLACEMENT Left   ? knee   ? KNEE ARTHROPLASTY  05/18/2012  ? Procedure: COMPUTER ASSISTED TOTAL KNEE ARTHROPLASTY;  Surgeon: Marybelle Killings, MD;  Location: [facility];  Service: Orthopedics;   Laterality: Left;  Left Total Knee Arthroplasty, Cemented, Computer Assisted  ? lt knee     ? meniscus tear  ? MOHS SURGERY    ? right nostril for basel cell cancer   ? NOSE SURGERY    ? due to trama at age 24   ? TOTAL ABDOMINAL HYSTERECTOMY W/ BILATERAL SALPINGOOPHORECTOMY    ? for Endometriosis   ? TOTAL SHOULDER ARTHROPLASTY Right 09/11/2013  ? Procedure: TOTAL SHOULDER ARTHROPLASTY;  Surgeon: Marybelle Killings, MD;  Location: [facility];  Service: Orthopedics;  Laterality: Right;  Right Total Shoulder Arthroplasty  ? ? ?Family history:  ?Family History  ?Problem Relation Age of Onset  ? Stroke Mother 59  ? Lung cancer Sister   ? Diabetes Brother   ? Atrial fibrillation Brother   ?     coumadin  ? Colon cancer Other   ?     1st Cousin   ? Allergic rhinitis Son   ? Allergic rhinitis Daughter   ? ? ?Social history: ?Lives in a home without carpeting with gas heating and central cooling.  No concern for water damage, mildew or roaches in the home.  Retired.  Currently taking care of her daughter s/p surgery.  Goes to the gym.  Drives self.  Does not report smoking history.  ? ? ?Medication List: ?Current Outpatient Medications  ?Medication Sig Dispense Refill  ? acetaminophen (TYLENOL) 325 MG tablet Take 650 mg by mouth every 6 (six) hours as needed for mild pain or headache.    ? Ascorbic Acid (VITAMIN C PO) Take 500 mg by mouth 2 (two) times daily.    ? B Complex-C (B-COMPLEX WITH VITAMIN C) tablet Take 1 tablet by mouth daily.    ? cholecalciferol (VITAMIN D) 1000 UNITS tablet Take 1,000 Units by mouth 2 (two) times daily.     ? conjugated estrogens (PREMARIN) vaginal cream Place 1 Applicatorful vaginally daily. 42.5 g 12  ? estradiol (ESTRACE) 0.5 MG tablet Take 0.5 mg by mouth daily.     ? latanoprost (XALATAN) 0.005 % ophthalmic solution     ? OVER THE COUNTER MEDICATION Take 1 capsule by mouth 2 (two) times daily. Jackson7 daily.    ? OVER THE COUNTER MEDICATION 2 (two) times daily. Preservision vitamin  for eyes    ? vitamin E 400 UNIT capsule Take 1 capsule by mouth daily.    ? neomycin-colistin-hydrocortisone-thonzonium (CORTISPORIN-TC) 3.07-11-08-0.5 MG/ML OTIC suspension Place 3 drops into both  ears 3 (three) times daily as needed (itching). 10 mL 0  ? ?No current facility-administered medications for this visit.  ? ? ?Known medication allergies: ?Allergies  ?Allergen Reactions  ? Alendronate Sodium Nausea And Vomiting  ? Anaplex Hd Nausea And Vomiting  ? Aspirin Nausea And Vomiting  ? Azithromycin Nausea And Vomiting  ? Calcium Nausea And Vomiting  ? Ciprofloxacin   ?  Patient states this caused nausea, dizziness and affected her muscles  ? Codeine Nausea And Vomiting  ? Dorzolamide Hcl-Timolol Mal Other (See Comments)  ? Doxycycline Nausea And Vomiting  ? Gabapentin Other (See Comments)  ?  Dizziness, Joint pain, blurred vision, changes in eyesight  ? Hydrocodone Nausea And Vomiting  ? Ibandronate Sodium   ?  REACTION: severe body aches  ? Ibuprofen Nausea And Vomiting  ? Penicillins Nausea And Vomiting  ? Raloxifene Nausea And Vomiting  ? Risedronate Sodium Nausea And Vomiting  ? Sulfonamide Derivatives Nausea And Vomiting  ? Teriparatide Nausea And Vomiting  ? Travoprost   ? ? ? ?Physical examination: ?Blood pressure 126/80, pulse 82, temperature 98.3 ?F (36.8 ?C), temperature source Temporal, resp. rate 12, height 5' 1.81" (1.57 m), weight 109 lb 6.4 oz (49.6 kg), SpO2 97 %. ? ?General: Alert, interactive, in no acute distress. ?HEENT: PERRLA, TMs pearly gray, turbinates moderately edematous without discharge, post-pharynx non erythematous. ?Neck: Supple without lymphadenopathy. ?Lungs: Clear to auscultation without wheezing, rhonchi or rales. {no increased work of breathing. ?CV: Normal S1, S2 without murmurs. ?Abdomen: Nondistended, nontender. ?Skin: Lower extremities with nightly from papules bilaterally.  Well-healed incision over the left knee . ?Extremities:  No clubbing, cyanosis or edema. ?Neuro:    Grossly intact. ? ?Diagnositics/Labs: ?None today ? ? ?Assessment and plan: ?Allergic rhinitis with conjunctivitis  ?- Allergy testing from 2020 showed: indoor molds and outdoor molds. ?Allergen avoidance measures for mol

## 2021-09-24 ENCOUNTER — Telehealth (INDEPENDENT_AMBULATORY_CARE_PROVIDER_SITE_OTHER): Payer: Medicare PPO | Admitting: Family Medicine

## 2021-09-24 ENCOUNTER — Encounter: Payer: Self-pay | Admitting: Family Medicine

## 2021-09-24 DIAGNOSIS — R195 Other fecal abnormalities: Secondary | ICD-10-CM | POA: Diagnosis not present

## 2021-09-24 MED ORDER — DICYCLOMINE HCL 10 MG PO CAPS: 10.0000 mg | ORAL_CAPSULE | Freq: Two times a day (BID) | ORAL | 0 refills | Status: DC

## 2021-09-24 NOTE — Progress Notes (Signed)
? ?  Subjective:  ? ? Patient ID: Heidi Moore, female    DOB: [birth year], 88 y.o.   MRN: [id number] ? ?HPI ?Virtual Visit via Telephone Note ? ?I connected with the patient on 09/24/21 at 11:00 AM EDT by telephone and verified that I am speaking with the correct person using two identifiers. ?  ?I discussed the limitations, risks, security and privacy concerns of performing an evaluation and management service by telephone and the availability of in person appointments. I also discussed with the patient that there may be a patient responsible charge related to this service. The patient expressed understanding and agreed to proceed. ? ?Location patient: home ?Location provider: work or home office ?Participants present for the call: patient, provider ?Patient did not have a visit in the prior 7 days to address this/these issue(s). ? ? ?History of Present Illness: ?Here to discuss loose stools. For the past 5 months she has had loose stools that come and go. Sometimes her stools are formed, but usually they are loose. They are never watery. No blood has been seen. No fever. No nausea. He weight has been stable. She has learned to avoid spicy foods, since they make this worse. No recent travel or antibiotic use.  ?  ?Observations/Objective: ?Patient sounds cheerful and well on the phone. ?I do not appreciate any SOB. ?Speech and thought processing are grossly intact. ?Patient reported vitals: ? ?Assessment and Plan: ?Her loose stools sound like they are functional, likely due to some IBS. She will try Dicyclomine 10 mg BID for a few weeks. She will follow up after that. ?Alysia Penna, MD ? ? ?Follow Up Instructions: ? ? ? ? ?35361 5-10 ?99442 11-20 ?9443 21-30 ?I did not refer this patient for an OV in the next 24 hours for this/these issue(s). ? ?I discussed the assessment and treatment plan with the patient. The patient was provided an opportunity to ask questions and all were answered. The patient agreed with the  plan and demonstrated an understanding of the instructions. ?  ?The patient was advised to call back or seek an in-person evaluation if the symptoms worsen or if the condition fails to improve as anticipated. ? ?I provided 18 minutes of non-face-to-face time during this encounter. ? ? ?Alysia Penna, MD   ? ? ?Review of Systems ? ?   ?Objective:  ? Physical Exam ? ? ? ? ?   ?Assessment & Plan:  ? ? ?

## 2021-10-07 ENCOUNTER — Encounter: Payer: Self-pay | Admitting: Family Medicine

## 2021-10-07 ENCOUNTER — Ambulatory Visit: Payer: Medicare PPO | Admitting: Family Medicine

## 2021-10-07 VITALS — BP 94/58 | HR 82 | Temp 98.4°F | Wt 109.5 lb

## 2021-10-07 DIAGNOSIS — H6123 Impacted cerumen, bilateral: Secondary | ICD-10-CM | POA: Diagnosis not present

## 2021-10-07 NOTE — Progress Notes (Signed)
   Subjective:    Patient ID: Heidi Moore, female    DOB: Nov 18, 1919, 88 y.o.   MRN: [id number]  HPI Here to clean wax in her ears. She recently saw her audiologist, and she suggested Heidi Moore come to use to clean them out. No pain.   Review of Systems  Constitutional: Negative.   HENT:  Positive for hearing loss. Negative for congestion and ear pain.   Respiratory: Negative.    Cardiovascular: Negative.       Objective:   Physical Exam Constitutional:      General: She is not in acute distress.    Appearance: Normal appearance.  HENT:     Right Ear: There is impacted cerumen.     Left Ear: There is impacted cerumen.  Cardiovascular:     Rate and Rhythm: Normal rate and regular rhythm.     Pulses: Normal pulses.     Heart sounds: Normal heart sounds.  Pulmonary:     Effort: Pulmonary effort is normal.     Breath sounds: Normal breath sounds.  Neurological:     Mental Status: She is alert.          Assessment & Plan:  Cerumen impactions. After informed consent was obtained, both ear canals were irrigated with water. She tolerated the procedure well. This was successful and upon re-exam afterwards both canals were clear.  Alysia Penna, MD

## 2021-10-30 ENCOUNTER — Telehealth: Payer: Self-pay | Admitting: Family Medicine

## 2021-10-30 NOTE — Telephone Encounter (Signed)
Spoke with patient and an appointment scheduled 

## 2021-10-30 NOTE — Telephone Encounter (Signed)
Pt thinks she is having thyroid issues and wants to have it checked. States she was treated for this previously by Dr Sarajane Jews. Has started having bladder issues. Patient seeking guidance. Offered OV, declined

## 2021-10-31 ENCOUNTER — Ambulatory Visit: Payer: Medicare PPO | Admitting: Family

## 2021-10-31 VITALS — BP 110/60 | HR 68 | Temp 97.7°F | Wt 108.5 lb

## 2021-10-31 DIAGNOSIS — E559 Vitamin D deficiency, unspecified: Secondary | ICD-10-CM

## 2021-10-31 DIAGNOSIS — E538 Deficiency of other specified B group vitamins: Secondary | ICD-10-CM | POA: Diagnosis not present

## 2021-10-31 DIAGNOSIS — G4700 Insomnia, unspecified: Secondary | ICD-10-CM | POA: Diagnosis not present

## 2021-10-31 DIAGNOSIS — R634 Abnormal weight loss: Secondary | ICD-10-CM

## 2021-10-31 DIAGNOSIS — E039 Hypothyroidism, unspecified: Secondary | ICD-10-CM

## 2021-10-31 DIAGNOSIS — E782 Mixed hyperlipidemia: Secondary | ICD-10-CM

## 2021-10-31 DIAGNOSIS — R350 Frequency of micturition: Secondary | ICD-10-CM

## 2021-10-31 LAB — CBC WITH DIFFERENTIAL/PLATELET
Basophils Absolute: 0.1 10*3/uL (ref 0.0–0.1)
Basophils Relative: 0.8 % (ref 0.0–3.0)
Eosinophils Absolute: 0.2 10*3/uL (ref 0.0–0.7)
Eosinophils Relative: 2.9 % (ref 0.0–5.0)
HCT: 41 % (ref 36.0–46.0)
Hemoglobin: 13.5 g/dL (ref 12.0–15.0)
Lymphocytes Relative: 19.1 % (ref 12.0–46.0)
Lymphs Abs: 1.2 10*3/uL (ref 0.7–4.0)
MCHC: 33 g/dL (ref 30.0–36.0)
MCV: 91.7 fl (ref 78.0–100.0)
Monocytes Absolute: 0.6 10*3/uL (ref 0.1–1.0)
Monocytes Relative: 10 % (ref 3.0–12.0)
Neutro Abs: 4.2 10*3/uL (ref 1.4–7.7)
Neutrophils Relative %: 67.2 % (ref 43.0–77.0)
Platelets: 179 10*3/uL (ref 150.0–400.0)
RBC: 4.47 Mil/uL (ref 3.87–5.11)
RDW: 14.3 % (ref 11.5–15.5)
WBC: 6.3 10*3/uL (ref 4.0–10.5)

## 2021-10-31 LAB — COMPREHENSIVE METABOLIC PANEL
ALT: 14 U/L (ref 0–35)
AST: 20 U/L (ref 0–37)
Albumin: 4.2 g/dL (ref 3.5–5.2)
Alkaline Phosphatase: 46 U/L (ref 39–117)
BUN: 12 mg/dL (ref 6–23)
CO2: 30 mEq/L (ref 19–32)
Calcium: 10 mg/dL (ref 8.4–10.5)
Chloride: 105 mEq/L (ref 96–112)
Creatinine, Ser: 0.92 mg/dL (ref 0.40–1.20)
GFR: 51.47 mL/min — ABNORMAL LOW (ref >60.00)
Glucose, Bld: 84 mg/dL (ref 70–99)
Potassium: 4.3 mEq/L (ref 3.5–5.1)
Sodium: 142 mEq/L (ref 135–145)
Total Bilirubin: 0.7 mg/dL (ref 0.2–1.2)
Total Protein: 7.2 g/dL (ref 6.0–8.3)

## 2021-10-31 LAB — LIPID PANEL
Cholesterol: 157 mg/dL (ref 0–200)
HDL: 66.3 mg/dL (ref >39.00)
LDL Cholesterol: 78 mg/dL (ref 0–99)
NonHDL: 90.26
Total CHOL/HDL Ratio: 2
Triglycerides: 63 mg/dL (ref 0.0–149.0)
VLDL: 12.6 mg/dL (ref 0.0–40.0)

## 2021-10-31 LAB — POCT URINALYSIS DIPSTICK
Bilirubin, UA: NEGATIVE
Blood, UA: NEGATIVE
Glucose, UA: NEGATIVE
Ketones, UA: NEGATIVE
Nitrite, UA: NEGATIVE
Protein, UA: NEGATIVE
Spec Grav, UA: 1.015 (ref 1.010–1.025)
Urobilinogen, UA: 0.2 E.U./dL
pH, UA: 6 (ref 5.0–8.0)

## 2021-10-31 LAB — MAGNESIUM: Magnesium: 2.1 mg/dL (ref 1.5–2.5)

## 2021-10-31 LAB — TSH: TSH: 7.47 u[IU]/mL — ABNORMAL HIGH (ref 0.35–5.50)

## 2021-10-31 LAB — VITAMIN B12: Vitamin B-12: 403 pg/mL (ref 211–911)

## 2021-11-02 ENCOUNTER — Other Ambulatory Visit: Payer: Self-pay | Admitting: Family

## 2021-11-02 MED ORDER — LEVOTHYROXINE SODIUM 25 MCG PO TABS: 25.0000 ug | ORAL_TABLET | Freq: Every day | ORAL | 3 refills | Status: DC

## 2021-11-03 ENCOUNTER — Other Ambulatory Visit: Payer: Self-pay | Admitting: Family

## 2021-11-04 ENCOUNTER — Ambulatory Visit (INDEPENDENT_AMBULATORY_CARE_PROVIDER_SITE_OTHER): Payer: Medicare PPO | Admitting: *Deleted

## 2021-11-04 DIAGNOSIS — N390 Urinary tract infection, site not specified: Secondary | ICD-10-CM

## 2021-11-04 LAB — URINE CULTURE
MICRO NUMBER:: 13564826
SPECIMEN QUALITY:: ADEQUATE

## 2021-11-04 LAB — VITAMIN D 1,25 DIHYDROXY
Vitamin D 1, 25 (OH)2 Total: 29 pg/mL (ref 18–72)
Vitamin D2 1, 25 (OH)2: <8 pg/mL
Vitamin D3 1, 25 (OH)2: 29 pg/mL

## 2021-11-04 MED ORDER — CEFTRIAXONE SODIUM 500 MG IJ SOLR
500.0000 mg | Freq: Once | INTRAMUSCULAR | Status: AC
  Administered 2021-11-04: 500 mg via INTRAMUSCULAR

## 2021-11-05 ENCOUNTER — Telehealth: Payer: Self-pay | Admitting: Family Medicine

## 2021-11-05 NOTE — Telephone Encounter (Addendum)
Pt called to request a referral to see a nephrologist. Pt stated she has a lot of swelling in her legs and believes it is due to her kidneys.  Please advise.

## 2021-11-05 NOTE — Telephone Encounter (Signed)
Left a message for the patient to return my call.  

## 2021-11-06 NOTE — Telephone Encounter (Signed)
Patient informed of the message below.

## 2021-11-07 ENCOUNTER — Encounter: Payer: Self-pay | Admitting: Family

## 2021-11-07 ENCOUNTER — Ambulatory Visit: Payer: Medicare PPO | Admitting: Family

## 2021-11-07 VITALS — BP 122/80 | HR 60 | Temp 98.1°F | Ht 61.0 in | Wt 108.9 lb

## 2021-11-07 DIAGNOSIS — N390 Urinary tract infection, site not specified: Secondary | ICD-10-CM

## 2021-11-07 DIAGNOSIS — L299 Pruritus, unspecified: Secondary | ICD-10-CM | POA: Diagnosis not present

## 2021-11-07 DIAGNOSIS — E039 Hypothyroidism, unspecified: Secondary | ICD-10-CM

## 2021-11-07 LAB — POC URINALSYSI DIPSTICK (AUTOMATED)
Bilirubin, UA: NEGATIVE
Blood, UA: NEGATIVE
Glucose, UA: NEGATIVE
Ketones, UA: NEGATIVE
Leukocytes, UA: NEGATIVE
Nitrite, UA: NEGATIVE
Protein, UA: NEGATIVE
Spec Grav, UA: 1.01 (ref 1.010–1.025)
Urobilinogen, UA: 0.2 E.U./dL
pH, UA: 6 (ref 5.0–8.0)

## 2021-11-07 MED ORDER — CLINDAMYCIN HCL 150 MG PO CAPS: 150.0000 mg | ORAL_CAPSULE | Freq: Three times a day (TID) | ORAL | 0 refills | Status: DC

## 2021-11-09 NOTE — Progress Notes (Signed)
Acute Office Visit  Subjective:     Patient ID: Heidi Moore, female    DOB: [birth year], 88 y.o.   MRN: [id number]  Chief Complaint  Patient presents with  . Dysuria    Recurrent xmonths  . Rash    Patient complains of itching noted on the chest area x4 days    HPI Patient is in today with persistent c/o burning with urination. Patient was positive for UTI and given Rocephin IM due to allergies and urine culture results. She continues to report burning.  Patient also has hypothyroidism that she has not begin to take Levothyroxine to treat as encouraged at her last OV. She is concerned because she is intolerant to so many medications.   Also reports having itching and burning across her chest and legs after the Rocephin injection. She has a PCN allergy. Reports being able to tolerate Clindamycin but not sure what other antibiotic she is able to take.     Review of Systems  Cardiovascular: Negative.   Genitourinary:  Positive for dysuria. Negative for frequency and hematuria.  Skin:  Positive for itching. Negative for rash.  Psychiatric/Behavioral:  The patient is nervous/anxious.   All other systems reviewed and are negative.      Objective:    BP 122/80 (BP Location: Left Arm, Patient Position: Sitting, Cuff Size: Normal)   Pulse 60   Temp 98.1 F (36.7 C) (Oral)   Ht '5\' 1"'$  (1.549 m)   Wt 108 lb 14.4 oz (49.4 kg)   SpO2 98%   BMI 20.58 kg/m    Physical Exam Vitals and nursing note reviewed.  Constitutional:      Appearance: Normal appearance. She is normal weight.  HENT:     Right Ear: Tympanic membrane, ear canal and external ear normal.     Left Ear: Tympanic membrane, ear canal and external ear normal.  Cardiovascular:     Rate and Rhythm: Normal rate and regular rhythm.  Pulmonary:     Effort: Pulmonary effort is normal.     Breath sounds: Normal breath sounds.  Abdominal:     General: Abdomen is flat.     Palpations: Abdomen is soft.   Musculoskeletal:        General: Normal range of motion.     Cervical back: Normal range of motion and neck supple.  Skin:    General: Skin is warm and dry.     Findings: Rash present.  Neurological:     General: No focal deficit present.     Mental Status: She is oriented to person, place, and time.  Psychiatric:        Mood and Affect: Mood normal.     Comments: anxious   Results for orders placed or performed in visit on 11/07/21  POCT Urinalysis Dipstick (Automated)  Result Value Ref Range   Color, UA yellow    Clarity, UA clear    Glucose, UA Negative Negative   Bilirubin, UA negative    Ketones, UA negative    Spec Grav, UA 1.010 1.010 - 1.025   Blood, UA negative    pH, UA 6.0 5.0 - 8.0   Protein, UA Negative Negative   Urobilinogen, UA 0.2 0.2 or 1.0 E.U./dL   Nitrite, UA negative    Leukocytes, UA Negative Negative        Assessment & Plan:   Problem List Items Addressed This Visit   None Visit Diagnoses     Urinary tract infection without  hematuria, site unspecified    -  Primary   Relevant Medications   clindamycin (CLEOCIN) 150 MG capsule   Other Relevant Orders   POCT Urinalysis Dipstick (Automated) (Completed)   Hypothyroidism, unspecified type       Pruritic condition           Meds ordered this encounter  Medications  . clindamycin (CLEOCIN) 150 MG capsule    Sig: Take 1 capsule (150 mg total) by mouth 3 (three) times daily.    Dispense:  21 capsule    Refill:  0  Urine obtained today, will notify patient pending results. Rx for clindamycin sent.  Advised patient to start Synthroid. I believe a lot of her minor complaints will be resolved once she starts.  Allegra '180mg'$  daily  Return in about 3 months (around 02/07/2022).  Kennyth Arnold, FNP

## 2021-11-10 ENCOUNTER — Telehealth: Payer: Self-pay | Admitting: Family Medicine

## 2021-11-10 DIAGNOSIS — E039 Hypothyroidism, unspecified: Secondary | ICD-10-CM

## 2021-11-10 NOTE — Telephone Encounter (Signed)
Pt notified of Padonda response & verb understanding. Would like the endocrinology referral. Referral will be placed.  Pt states that she does not want to schedule an appt for muscle spasms at this time.

## 2021-11-10 NOTE — Telephone Encounter (Signed)
Thinks she is having a reaction to the thyroid medication she was recently prescribed. Pain shooting across her hip, difficulty walking. Requesting a call today for guidance as to whether she should continue with the medication

## 2021-11-12 ENCOUNTER — Other Ambulatory Visit: Payer: Self-pay | Admitting: Family

## 2021-11-12 DIAGNOSIS — E039 Hypothyroidism, unspecified: Secondary | ICD-10-CM

## 2021-11-12 NOTE — Telephone Encounter (Signed)
Left a detailed message at the patient's home number stating the referral was placed as below and someone will contact her with appt info.  Patient has an appt scheduled with Dr Legrand Como on 7/27.

## 2021-11-12 NOTE — Telephone Encounter (Addendum)
Pt said she is itching and burning also having muscle spasms and she is a little better she has been using ice and heat on her buttock . Pt went back on tsh med on Friday after her visit with padonda  she could not get out of bed on sun due to muscle spasms in both hips. Pt states she is wear a back brace and [place] endocrinologist does not have opening for new pt until feb 2024. Pt is not longer going to take the tsh medication . Pt has been taking allergy for itching. Pt said she has the same reaction  when dr Ethlyn Gallery put her on tsh medication. Pt would like a referral to another endocrinologist. Pt states she still has fluid in her knee to lower leg and wearing support hoses

## 2021-11-13 ENCOUNTER — Telehealth: Payer: Self-pay | Admitting: Allergy

## 2021-11-13 NOTE — Telephone Encounter (Signed)
Patient called and would like for someone to call her about her itching and burning she is having on back and legs. She is taking allegra and it helps a little. [facility]  [phone].

## 2021-11-13 NOTE — Telephone Encounter (Signed)
Called patient - DOB verified - Patient states she woke up about 4 am this morning with severe itching and burning sensation - back of legs, chills. Patient states she believes she's reacting to either the antibiotic or injection she received from her PCP office last week. Patient stated she took  [place] 1/2 tsp @ 9:30 am then another 1/2 tsp @ 10:00 am to see if that helped - did have a little relief. Patient asked about taking Zyrtec - advised since she's taken Allegra already this morning, take Zyrtec 5 mg or 10 mg at bedtime to see if she gets any relief. Patient was getting the medications confused - advised to make sure she reads the directions/instructions on both bottles to ensure she is taking both medications as directed.   Patient also advised to contact PCP regarding her symptoms for possible allergic reaction to prescribed antibiotic or injection she had received.  Patient verbalized understanding - stated she would call them back.  I asked if anyone was home with patient - she stated no. I contacted patient's daughter, Karna Christmas to make sure patient was taking the medication correctly. Terri stated patient was with her - pharmacist and PCP confirmed correct directions were given to patient. Terri thanked me for calling to make sure her mother was okay.

## 2021-11-20 ENCOUNTER — Telehealth: Payer: Self-pay

## 2021-11-20 NOTE — Telephone Encounter (Signed)
---  Caller she received injection for bladder infection (2.5 weeks ago) and was seen again on the 30th (stopped abx at that point for a reaction), Caller states she now has fluid in her legs and its burning/hot from the knee down with swelling, her waist across her hip and down her back and around has tightness/numbness and tingling. Caller notes chills. Caller denies any fever.  11/19/2021 12:29:28 PM Go to ED [facility], RN, [place]  Comments User: Reynold Bowen, [place], RN Date/Time Eilene Ghazi Time): 11/19/2021 12:35:52 PM Called backline 3x before I was able to speak with an office worker, provided information and she noted she would call the pt back. Thank you. Referrals REFERRED TO PCP OFFICE  11/20/21 1444 - Pt states she is having swelling in legs "for weeks" that mostly around ankles. States her hands are also swollen. Pt is requesting to come in for OV due to brown spots & wants her liver checked. Pt states she went to ED "a few years ago" Pt states she does not want to "go over there & sit for hours" & wants to be seen tomorrow. Pt states she started the Synthroid this morning. Appt scheduled with Padonda at 2:30PM.

## 2021-11-21 ENCOUNTER — Encounter: Payer: Self-pay | Admitting: Family

## 2021-11-21 ENCOUNTER — Ambulatory Visit: Payer: Medicare PPO | Admitting: Family

## 2021-11-21 VITALS — BP 112/60 | HR 79 | Temp 97.9°F | Ht 61.0 in | Wt 108.6 lb

## 2021-11-21 DIAGNOSIS — E039 Hypothyroidism, unspecified: Secondary | ICD-10-CM | POA: Diagnosis not present

## 2021-11-21 DIAGNOSIS — R300 Dysuria: Secondary | ICD-10-CM

## 2021-11-21 DIAGNOSIS — F419 Anxiety disorder, unspecified: Secondary | ICD-10-CM | POA: Diagnosis not present

## 2021-11-21 DIAGNOSIS — R600 Localized edema: Secondary | ICD-10-CM

## 2021-11-21 MED ORDER — ALPRAZOLAM 0.25 MG PO TABS: 0.2500 mg | ORAL_TABLET | Freq: Two times a day (BID) | ORAL | 0 refills | Status: DC | PRN

## 2021-11-23 NOTE — Progress Notes (Signed)
Established Patient Office Visit  Subjective   Patient ID: Heidi Moore, female    DOB: [birth year]  Age: 88 y.o. MRN: [id number]  Chief Complaint  Patient presents with   Edema    Patient complains of bilateral ankle swelling xmonths   Rash    Patient complains of brown spots noted on the right forearm and legs    HPI 88 year old female presents today with c/o a stinging sensation to her lower abdomen, legs,  that has been ongoing for months. It is associated with a feeling of fever and chills. She has no actual fever. Also has concerns of swelling in her ankles that has been on-going for several months. She wears support hose that helps. She also has feelings of being worked up and anxious.   She has hypothyroidism that she has not treated.She reports having an allergic reaction to the Levothyroxine. She would like a referral to endocrinology. Refuses to take Levothyroxine  She has been seen Neurology, Cardiology, and Urology for her symptoms.   Review of Systems  Constitutional:  Positive for malaise/fatigue.  Respiratory: Negative.    Cardiovascular:  Positive for leg swelling.  Genitourinary:  Positive for dysuria.  Musculoskeletal: Negative.   Neurological:  Positive for dizziness.  Psychiatric/Behavioral:  The patient is nervous/anxious.   All other systems reviewed and are negative.  Past Medical History:  Diagnosis Date   AC (acromioclavicular) joint bone spurs    on right shoulder    Allergic rhinitis    Arthritis    Bursitis    right shoulder   Complication of anesthesia    headaches   Diverticulosis    DIVERTICULOSIS, COLON 10/15/2006   Qualifier: Diagnosis of  By: Linna Darner MD, Lorretta Harp problem    treated by Dr Kathlen Mody per patient   [id number](517.6)    sinus HA   History of blood transfusion 20yr ago   no abnormal reaction noted   History of cystitis    History of skin cancer    Basal cell, Dr. FLindwood Coke  Hyperlipidemia    [place] Study  LDL goal =<160    Hypotension    Joint pain    Joint swelling    Macular degeneration, dry    Osteopenia    Intolerance to Calcium,Actonel,Fosamax,Evista, Forteo: S/P Boniva 2007 to 03/2010   OSTEOPENIA 03/12/2008   Qualifier: Diagnosis of  By: HVelora Heckler    Osteoporosis    PONV (postoperative nausea and vomiting)    Rosacea 04/22/2009   Qualifier: Diagnosis of  By: HLinna DarnerMD, William     Skin cancer 06/22/2017   per patient after excision right calf by Dr LUbaldo Glassing  SKIN CANCER, HX OF 03/12/2008   Qualifier: Diagnosis of  By: HVelora Heckler    Urinary frequency    Vaginal atrophy    sees Dr. 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 She lives alone. She goes to the gym and participates in aerobics 3 x week. She drinks 3 cups of coffee a day.         Social Determinants of Health   Financial Resource Strain: Low Risk  (07/08/2021)   Overall Financial Resource Strain  (CARDIA)    Difficulty of Paying Living Expenses: Not hard at all  Food Insecurity: No Food Insecurity (07/08/2021)   Hunger Vital Sign    Worried About Running Out of Food in the Last Year: Never true    Ran Out of Food in the Last Year: Never true  Transportation Needs: No Transportation Needs (07/08/2021)   PRAPARE - Hydrologist (Medical): No    Lack of Transportation (Non-Medical): No  Physical Activity: Insufficiently Active (07/08/2021)   Exercise Vital Sign    Days of Exercise per Week: 2 days    Minutes of Exercise per Session: 30 min  Stress: No Stress Concern Present (07/08/2021)   [place]    Feeling of Stress : Not at all  Social Connections: Moderately Integrated (07/08/2021)   Social Connection and Isolation Panel [NHANES]    Frequency of Communication with Friends and Family: More than three times a week    Frequency of Social Gatherings with Friends and Family: More than three times a week    Attends Religious Services: More than 4 times per year    Active Member of [employer] or Organizations: Yes    Attends Archivist Meetings: More than 4 times per year    Marital Status: Widowed  Intimate Partner Violence: Not At Risk (07/08/2021)   Humiliation, Afraid, Rape, and Kick questionnaire    Fear of Current or Ex-Partner: No    Emotionally Abused: No    Physically Abused: No    Sexually Abused: No    Past Surgical History:  Procedure Laterality Date   APPENDECTOMY     BUNIONECTOMY Bilateral    CATARACT EXTRACTION, BILATERAL     COLONOSCOPY     with Tics (no further f/u as per GI due to age)    JOINT REPLACEMENT Left    knee    KNEE ARTHROPLASTY  05/18/2012   Procedure: COMPUTER ASSISTED TOTAL KNEE ARTHROPLASTY;  Surgeon: Marybelle Killings, MD;  Location: [facility];  Service: Orthopedics;  Laterality: Left;  Left Total Knee Arthroplasty, Cemented, Computer Assisted   lt knee       meniscus tear   MOHS SURGERY     right nostril for basel cell cancer    NOSE SURGERY     due to trama at age 44    TOTAL ABDOMINAL HYSTERECTOMY W/ BILATERAL SALPINGOOPHORECTOMY     for Endometriosis    TOTAL SHOULDER ARTHROPLASTY Right 09/11/2013   Procedure: TOTAL SHOULDER ARTHROPLASTY;  Surgeon: Marybelle Killings, MD;  Location: [facility];  Service: Orthopedics;  Laterality: Right;  Right Total Shoulder Arthroplasty    Family History  Problem Relation Age of Onset   Stroke Mother 48   Lung cancer Sister    Diabetes Brother    Atrial fibrillation Brother        coumadin   Colon cancer Other        1st Cousin    Allergic rhinitis Son    Allergic rhinitis Daughter     Allergies  Allergen Reactions   Alendronate Sodium Nausea And Vomiting   Anaplex Hd Nausea And Vomiting   Aspirin Nausea And Vomiting   Azithromycin Nausea And Vomiting   Calcium Nausea And Vomiting   Ciprofloxacin     Patient states this caused nausea, dizziness and affected her muscles   Codeine Nausea And Vomiting   Dorzolamide Hcl-Timolol Mal Other (See Comments)   Doxycycline Nausea And Vomiting   Gabapentin Other (See Comments)    Dizziness, Joint pain, blurred vision, changes in eyesight   Hydrocodone Nausea And Vomiting   Ibandronate Sodium     REACTION: severe body aches   Ibuprofen Nausea And Vomiting   Penicillins Nausea And Vomiting   Raloxifene Nausea And Vomiting   Risedronate Sodium Nausea And Vomiting   Sulfonamide Derivatives Nausea And Vomiting   Teriparatide Nausea And Vomiting   Travoprost     Current Outpatient Medications on File Prior to Visit  Medication Sig Dispense Refill   acetaminophen (TYLENOL) 325 MG tablet Take 650 mg by mouth every 6 (six) hours as needed for mild pain or headache.     Ascorbic Acid (VITAMIN C PO) Take 500 mg by mouth 2 (two) times daily.     azelastine (ASTELIN) 0.1 % nasal spray Place 2 sprays into both nostrils 2 (two) times daily as needed for  rhinitis. Use in each nostril as directed 30 mL 5   B Complex-C (B-COMPLEX WITH VITAMIN C) tablet Take 1 tablet by mouth daily.     cholecalciferol (VITAMIN D) 1000 UNITS tablet Take 1,000 Units by mouth 2 (two) times daily.      clindamycin (CLEOCIN) 150 MG capsule Take 1 capsule (150 mg total) by mouth 3 (three) times daily. 21 capsule 0   conjugated estrogens (PREMARIN) vaginal cream Place 1 Applicatorful vaginally daily. 42.5 g 12   dicyclomine (BENTYL) 10 MG capsule Take 1 capsule (10 mg total) by mouth in the morning and at bedtime. 60 capsule 0   estradiol (ESTRACE) 0.5 MG tablet Take 0.5 mg by mouth daily.      fexofenadine (ALLEGRA ALLERGY CHILDRENS) 30 MG/5ML suspension 1/4 tsp daily 118 mL 5   latanoprost (XALATAN) 0.005 % ophthalmic solution      levothyroxine (SYNTHROID) 25 MCG tablet Take 1 tablet (25 mcg total) by mouth daily. 30 tablet 3   neomycin-colistin-hydrocortisone-thonzonium (CORTISPORIN-TC) 3.07-11-08-0.5 MG/ML OTIC suspension Place 3 drops into both ears 3 (three) times daily as needed (itching). 10 mL 0   OVER THE COUNTER MEDICATION Take 1 capsule by mouth 2 (two) times daily. [place]7 daily.     OVER THE COUNTER MEDICATION 2 (two) times daily. Preservision vitamin for eyes     triamcinolone (NASACORT) 55 MCG/ACT AERO nasal inhaler Place 1 spray into the nose daily. 1 each 5   vitamin E 400 UNIT capsule Take 1 capsule by mouth daily.     No current facility-administered medications on file prior to visit.  BP 112/60 (BP Location: Left Arm, Patient Position: Sitting, Cuff Size: Normal)   Pulse 79   Temp 97.9 F (36.6 C) (Oral)   Ht '5\' 1"'$  (1.549 m)   Wt 108 lb 9.6 oz (49.3 kg)   SpO2 96%   BMI 20.52 kg/m chart    Objective:     BP 112/60 (BP Location: Left Arm, Patient Position: Sitting, Cuff Size: Normal)   Pulse 79   Temp 97.9 F (36.6 C) (Oral)   Ht '5\' 1"'$  (1.549 m)   Wt 108 lb 9.6 oz (49.3 kg)   SpO2 96%   BMI 20.52 kg/m     Physical Exam Vitals reviewed.  Constitutional:      Appearance: Normal appearance. She is normal weight.  Cardiovascular:     Rate and Rhythm: Normal rate and regular rhythm.  Pulmonary:     Effort: Pulmonary effort is normal.  Abdominal:     General: Abdomen is flat. Bowel sounds are normal.  Musculoskeletal:     Cervical back: Normal range of motion and neck supple.     Right lower leg: Edema present.     Left lower leg: Edema present.  Skin:    General: Skin is warm and dry.  Neurological:     General: No focal deficit present.     Mental Status: She is alert and oriented to person, place, and time.  Psychiatric:        Mood and Affect: Mood normal.        Behavior: Behavior normal.     Comments: anxious      No results found for any visits on 11/21/21.    The ASCVD Risk score (Arnett DK, et al., 2019) failed to calculate for the following reasons:   The 2019 ASCVD risk score is only valid for ages 5 to 32    Assessment & Plan:   Problem List Items Addressed This Visit   None Visit Diagnoses     Anxiety    -  Primary   Relevant Medications   ALPRAZolam (XANAX) 0.25 MG tablet   Hypothyroidism, unspecified type       Mild peripheral edema       Dysuria          Patient have anxiety. She worries about any new medication. She does admit to being anxious and I do believe she needs a low dose anti-anxiety med as needed for times when she is wound up.Refer  to endocrinology.   No follow-ups on file.    Kennyth Arnold, FNP

## 2021-11-25 DIAGNOSIS — M9901 Segmental and somatic dysfunction of cervical region: Secondary | ICD-10-CM | POA: Diagnosis not present

## 2021-11-25 DIAGNOSIS — M9903 Segmental and somatic dysfunction of lumbar region: Secondary | ICD-10-CM | POA: Diagnosis not present

## 2021-11-25 DIAGNOSIS — M5136 Other intervertebral disc degeneration, lumbar region: Secondary | ICD-10-CM | POA: Diagnosis not present

## 2021-11-25 DIAGNOSIS — M50322 Other cervical disc degeneration at C5-C6 level: Secondary | ICD-10-CM | POA: Diagnosis not present

## 2021-12-02 ENCOUNTER — Telehealth: Payer: Self-pay | Admitting: Family Medicine

## 2021-12-02 NOTE — Telephone Encounter (Signed)
Pt feels she is having issues with levothyroxine (SYNTHROID) 25 MCG tablet  diarrhea, weight loss, chills, dizziness, headache, has a TOC appointment 12/04/21. Wants to know if she can stop taking this medication until she comes in.

## 2021-12-03 NOTE — Telephone Encounter (Signed)
Yes she should stop taking it before she sees me. Thanks!

## 2021-12-03 NOTE — Telephone Encounter (Signed)
Patient informed of the message below.

## 2021-12-04 ENCOUNTER — Encounter: Payer: Self-pay | Admitting: Family Medicine

## 2021-12-04 ENCOUNTER — Ambulatory Visit: Payer: Medicare PPO | Admitting: Family Medicine

## 2021-12-04 VITALS — BP 130/60 | HR 73 | Temp 97.9°F | Ht 61.0 in | Wt 105.1 lb

## 2021-12-04 DIAGNOSIS — E039 Hypothyroidism, unspecified: Secondary | ICD-10-CM | POA: Diagnosis not present

## 2021-12-04 DIAGNOSIS — R300 Dysuria: Secondary | ICD-10-CM | POA: Diagnosis not present

## 2021-12-04 DIAGNOSIS — G609 Hereditary and idiopathic neuropathy, unspecified: Secondary | ICD-10-CM | POA: Insufficient documentation

## 2021-12-04 LAB — POC URINALSYSI DIPSTICK (AUTOMATED)
Bilirubin, UA: NEGATIVE
Blood, UA: NEGATIVE
Glucose, UA: NEGATIVE
Ketones, UA: NEGATIVE
Leukocytes, UA: NEGATIVE
Nitrite, UA: NEGATIVE
Protein, UA: NEGATIVE
Spec Grav, UA: 1.01 (ref 1.010–1.025)
Urobilinogen, UA: 0.2 E.U./dL
pH, UA: 6 (ref 5.0–8.0)

## 2021-12-04 MED ORDER — DULOXETINE HCL 20 MG PO CPEP: 20.0000 mg | ORAL_CAPSULE | Freq: Every day | ORAL | 3 refills | Status: DC

## 2021-12-04 NOTE — Assessment & Plan Note (Signed)
UA dipstick performed in office was is negative for infection. Most likely her vaginal burning is related to her vaginal atrophy. She is already treated with estrogen, will recommend we continue this for her.

## 2021-12-04 NOTE — Assessment & Plan Note (Signed)
Seen as elevated TSH last month. I recommended to the patient that she continue to stay on this medication and that it is too early to check her TSH again, she will need to return to the office in 30 days to have a repeat TSH performed.

## 2021-12-04 NOTE — Progress Notes (Signed)
Established Patient Office Visit  Subjective   Patient ID: Heidi Moore, female    DOB: Aug 28, 1919  Age: 88 y.o. MRN: [id number]  Chief Complaint  Patient presents with   Establish Care   Medication Reaction    Patient complains of diarrhea, weight loss, chills, dizziness and headache, which she feels is due to Levothyroxine-discontinued last night    Patient is here for follow up. She reports multiple ongoing symptoms.  BL leg numbness/ burning, vaginal burning and burning in her "intestines" -- pt reports this has been going on for years and she has tried multiple medications for this including premarin cream, bentyl, , has been to several specialists including vascular and neurology. Has received a work up including imaging studies, MRI's, etc. States she was told by the vascular surgeon that it was not being caused by her circulation, also was told by the neurologist that she did not have neuropathy. States that the burning/ numbness is persistent and she also often wakes up at night with hot flashes and "feels like she is on fire"   Patient was seen in June and labs were performed, most were essentially WNL except for her TSH. Iat that time it looked like she also had a UTI which grew Klebsiella and this was treated with Cleocin. It was decided to give her a trial of levothyroxine 25 mcg daily. Patient state she took it for 19 days but did not feel any different so she stopped taking it last night. States she is very sensitive to medication and thought her dizziness and hot flashes were being caused by the thyroid medication. She also reports having to wake up multiple times at night to urinate. States that she has trouble falling asleep at night but she is able to sleep in in the morning.  I have spent at least 40 minutes reviewing notes from previous specialists, reviewing labs, imaging studies ans going through her medication list. She did not take the alprazolam that she was given,  states it made her extremely dizzy and I advised she turn it back in to the pharmacist to dispose of.    Outpatient Encounter Medications as of 12/04/2021  Medication Sig   acetaminophen (TYLENOL) 325 MG tablet Take 650 mg by mouth every 6 (six) hours as needed for mild pain or headache.   Ascorbic Acid (VITAMIN C PO) Take 500 mg by mouth 2 (two) times daily.   azelastine (ASTELIN) 0.1 % nasal spray Place 2 sprays into both nostrils 2 (two) times daily as needed for rhinitis. Use in each nostril as directed   B Complex-C (B-COMPLEX WITH VITAMIN C) tablet Take 1 tablet by mouth daily.   cholecalciferol (VITAMIN D) 1000 UNITS tablet Take 1,000 Units by mouth 2 (two) times daily.    conjugated estrogens (PREMARIN) vaginal cream Place 1 Applicatorful vaginally daily.   DULoxetine (CYMBALTA) 20 MG capsule Take 1 capsule (20 mg total) by mouth daily.   estradiol (ESTRACE) 0.5 MG tablet Take 0.5 mg by mouth daily.    fexofenadine (ALLEGRA ALLERGY CHILDRENS) 30 MG/5ML suspension 1/4 tsp daily   latanoprost (XALATAN) 0.005 % ophthalmic solution    neomycin-colistin-hydrocortisone-thonzonium (CORTISPORIN-TC) 3.07-11-08-0.5 MG/ML OTIC suspension Place 3 drops into both ears 3 (three) times daily as needed (itching).   OVER THE COUNTER MEDICATION Take 1 capsule by mouth 2 (two) times daily. [place]7 daily.   OVER THE COUNTER MEDICATION 2 (two) times daily. Preservision vitamin for eyes   triamcinolone (NASACORT) 55  MCG/ACT AERO nasal inhaler Place 1 spray into the nose daily.   vitamin E 400 UNIT capsule Take 1 capsule by mouth daily.   [DISCONTINUED] ALPRAZolam (XANAX) 0.25 MG tablet Take 1 tablet (0.25 mg total) by mouth 2 (two) times daily as needed for anxiety.   [DISCONTINUED] clindamycin (CLEOCIN) 150 MG capsule Take 1 capsule (150 mg total) by mouth 3 (three) times daily.   [DISCONTINUED] dicyclomine (BENTYL) 10 MG capsule Take 1 capsule (10 mg total) by mouth in the morning and at  bedtime.   [DISCONTINUED] levothyroxine (SYNTHROID) 25 MCG tablet Take 1 tablet (25 mcg total) by mouth daily.   No facility-administered encounter medications on file as of 12/04/2021.    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 Brandy Hale 08/24/2012   Hot flashes - followed by gyn, Dr. Gertie Fey 08/24/2012   Osteoarthritis of left knee 05/17/2012    Class: Diagnosis of   LOW BACK PAIN SYNDROME 05/07/2010   Vitamin D deficiency 04/22/2009   Allergic rhinitis 03/12/2008   Hyperlipemia 10/22/2006      Review of Systems  All other systems reviewed and are negative.     Objective:     BP 130/60 (BP Location: Left Arm, Patient Position: Sitting, Cuff Size: Normal)   Pulse 73   Temp 97.9 F (36.6 C) (Oral)   Ht '5\' 1"'$  (1.549 m)   Wt 105 lb 1.6 oz (47.7 kg)   SpO2 97%   BMI 19.86 kg/m    Physical Exam Vitals reviewed.  Constitutional:      Appearance: Normal appearance. She is well-groomed and normal weight.  HENT:     Head: Normocephalic and atraumatic.     Mouth/Throat:     Mouth: Mucous membranes are moist.     Pharynx: Oropharynx is clear.  Eyes:     Extraocular Movements: Extraocular movements intact.     Conjunctiva/sclera: Conjunctivae normal.     Pupils: Pupils are equal, round, and reactive to light.  Cardiovascular:     Rate and Rhythm: Normal rate and regular rhythm.     Pulses: Normal pulses.     Heart sounds: S1 normal and S2  normal.  Pulmonary:     Effort: Pulmonary effort is normal.     Breath sounds: Normal breath sounds and air entry.  Abdominal:     General: Abdomen is flat. Bowel sounds are normal.     Palpations: Abdomen is soft.  Musculoskeletal:        General: Normal range of motion.     Cervical back: Normal range of motion and neck supple.     Right lower leg: No edema.     Left lower leg: No edema.  Skin:    General: Skin is warm and dry.  Neurological:     Mental Status: She is alert and oriented to person, place, and time. Mental status is at baseline.     Gait: Gait is intact.  Psychiatric:  Mood and Affect: Mood and affect normal.        Speech: Speech normal.        Behavior: Behavior normal.        Judgment: Judgment normal.      Results for orders placed or performed in visit on 12/04/21  POCT Urinalysis Dipstick (Automated)  Result Value Ref Range   Color, UA yellow    Clarity, UA clear    Glucose, UA Negative Negative   Bilirubin, UA negative    Ketones, UA negative    Spec Grav, UA 1.010 1.010 - 1.025   Blood, UA negative    pH, UA 6.0 5.0 - 8.0   Protein, UA Negative Negative   Urobilinogen, UA 0.2 0.2 or 1.0 E.U./dL   Nitrite, UA negative    Leukocytes, UA Negative Negative    Last thyroid functions Lab Results  Component Value Date   TSH 7.47 (H) 10/31/2021   Last vitamin D Lab Results  Component Value Date   VD25OH 31.79 12/04/2020   Last vitamin B12 and Folate Lab Results  Component Value Date   VITAMINB12 403 10/31/2021      The ASCVD Risk score (Arnett DK, et al., 2019) failed to calculate for the following reasons:   The 2019 ASCVD risk score is only valid for ages 65 to 76    Assessment & Plan:   Problem List Items Addressed This Visit       Endocrine   Hypothyroidism - Primary (Chronic)    Seen as elevated TSH last month. I recommended to the patient that she continue to stay on this medication and that it is too early to check  her TSH again, she will need to return to the office in 30 days to have a repeat TSH performed.       Relevant Orders   TSH     Nervous and Auditory   Idiopathic polyneuropathy    After extensive discussion with the patient and looking through her previous progress notes it is my opinion that the patient has chronic idiopathic polyneuropathy. We discussed the treatment of this problem and I recommended that she try a small dose of cymbalta 20 mg to help manage her symptoms. I fell that her advanced age would put her at increased risk of falls if we were to try gabapentin or lyrica so I would not recommend these medications at this time. I will follow up with her in 3 months to re-evaluate her symptoms. I discussed potential side effects with her also this visit.      Relevant Medications   DULoxetine (CYMBALTA) 20 MG capsule     Other   Dysuria    UA dipstick performed in office was is negative for infection. Most likely her vaginal burning is related to her vaginal atrophy. She is already treated with estrogen, will recommend we continue this for her.       Relevant Orders   POCT Urinalysis Dipstick (Automated) (Completed)    Return in about 3 months (around 03/06/2022).    Farrel Conners, MD

## 2021-12-04 NOTE — Patient Instructions (Addendum)
Take thyroid pill every day. Come back to the office in 30 days to get your blood test (TSH).  Make sure you take your thyroid pill on an empty stomach with plain water. Wait at least 1 hour before you take any med or eat your breakfast.  Start duloxetine (cymbalta) daily. You may take this either in the morning or at night. If it makes you drowsy then you should take it at nighttime.

## 2021-12-04 NOTE — Assessment & Plan Note (Signed)
After extensive discussion with the patient and looking through her previous progress notes it is my opinion that the patient has chronic idiopathic polyneuropathy. We discussed the treatment of this problem and I recommended that she try a small dose of cymbalta 20 mg to help manage her symptoms. I fell that her advanced age would put her at increased risk of falls if we were to try gabapentin or lyrica so I would not recommend these medications at this time. I will follow up with her in 3 months to re-evaluate her symptoms. I discussed potential side effects with her also this visit.

## 2021-12-09 ENCOUNTER — Telehealth: Payer: Self-pay | Admitting: Family Medicine

## 2021-12-09 NOTE — Telephone Encounter (Signed)
Heidi Moore called from Access nurse and stated that Heidi Moore called them and said that she has been taking DULoxetine (CYMBALTA) 20 MG capsule for 4 days and it has been causing her dizziness and blurred vision.   Triage recommended that she be seen in the next 4 hrs however pt refused and didn't want to be seen by anybody. Pt stated she is going to stop taking DULoxetine (CYMBALTA) 20 MG capsule.   FYI

## 2021-12-10 ENCOUNTER — Telehealth: Payer: Self-pay | Admitting: Family Medicine

## 2021-12-10 NOTE — Telephone Encounter (Signed)
Spoke with the patient and she complains of severe dizziness, chills, temperature not taken and swelling of the lips since starting Duloxetine.  Message sent to PCP.

## 2021-12-10 NOTE — Telephone Encounter (Signed)
It's ok to have her stop the medication to see if her dizziness resolves.

## 2021-12-10 NOTE — Telephone Encounter (Signed)
Pt is having a bad reaction to the medication and would like a call back  Pt was offered to speak to Triage and refused. Pt only wants to speak to PCP or CMA. [phone]  Pt was scheduled for a phone visit on Friday.

## 2021-12-10 NOTE — Telephone Encounter (Signed)
Patient informed of the results

## 2021-12-12 ENCOUNTER — Telehealth (INDEPENDENT_AMBULATORY_CARE_PROVIDER_SITE_OTHER): Payer: Medicare PPO | Admitting: Family Medicine

## 2021-12-12 ENCOUNTER — Encounter: Payer: Self-pay | Admitting: Family Medicine

## 2021-12-12 DIAGNOSIS — R42 Dizziness and giddiness: Secondary | ICD-10-CM

## 2021-12-12 NOTE — Progress Notes (Addendum)
Established Patient Office Visit  Subjective   Patient ID: Heidi Moore, female    DOB: 07/28/1919  Age: 88 y.o. MRN: [id number]  Chief Complaint  Patient presents with   Medication Reaction    Patient complains of dizziness from Duloxetine   I connected with  [name] on 12/30/21 by a video enabled telemedicine application and verified that I am speaking with the correct person using two identifiers.   I discussed the limitations of evaluation and management by telemedicine. The patient expressed understanding and agreed to proceed.   Telephone visit performed today, this is an audio only visit. The patient was recently started on duloxetine last week by me to see if this would help her peripheral neuropathy symptoms. States she took the medication for about 5 days and she experienced dizziness and headache. She called the office and she was instructed to stop the medication. States she did stop the medicine but the dizziness has persisted. States she is still taking the levothyroxine. States she thinks that the dizziness is coming from the levothyroxine. We had a long discussion about her symptoms -- the patient states she is still exercising and going to the gym regularly. Has not fallen or experienced symptoms of passing out. I reassured patient and encouraged her to get her repeat TSH at the end of the month.   I spent a total of 30 minutes in discussion with the patient concerning this issue. Current Outpatient Medications  Medication Instructions   acetaminophen (TYLENOL) 650 mg, Oral, Every 6 hours PRN   Ascorbic Acid (VITAMIN C PO) 500 mg, Oral, 2 times daily   azelastine (ASTELIN) 0.1 % nasal spray 2 sprays, Each Nare, 2 times daily PRN, Use in each nostril as directed   B Complex-C (B-COMPLEX WITH VITAMIN C) tablet 1 tablet, Daily   cholecalciferol (VITAMIN D) 1,000 Units, Oral, 2 times daily   conjugated estrogens (PREMARIN) vaginal cream 1 Applicatorful, Vaginal, Daily    estradiol (ESTRACE) 0.5 mg, Oral, Daily   fexofenadine (ALLEGRA ALLERGY CHILDRENS) 30 MG/5ML suspension 1/4 tsp daily   latanoprost (XALATAN) 0.005 % ophthalmic solution No dose, route, or frequency recorded.   neomycin-colistin-hydrocortisone-thonzonium (CORTISPORIN-TC) 3.07-11-08-0.5 MG/ML OTIC suspension 3 drops, Both EARS, 3 times daily PRN   OVER THE COUNTER MEDICATION 1 capsule, 2 times daily   OVER THE COUNTER MEDICATION 2 times daily, Preservision vitamin for eyes    triamcinolone (NASACORT) 55 MCG/ACT AERO nasal inhaler 1 spray, Nasal, Daily   vitamin E 400 UNIT capsule 1 capsule, Oral, Daily     Review of Systems  All other systems reviewed and are negative.     Objective:     There were no vitals taken for this visit.   Physical Exam Alert and oriented to person and place. Mentation is at baseline.   No results found for any visits on 12/12/21.    The ASCVD Risk score (Arnett DK, et al., 2019) failed to calculate for the following reasons:   The 2019 ASCVD risk score is only valid for ages 81 to 73    Assessment & Plan:   Problem List Items Addressed This Visit       Other   Dizziness (Chronic)    Persistent despite stopping the duloxetine. Her issues are very similar in presentation from the previous visit. I encouraged her to wait until the repeat TSH is performed and then we will decide what to do about the levothyroxine dose.  No follow-ups on file.    Farrel Conners, MD

## 2021-12-15 NOTE — Assessment & Plan Note (Signed)
Persistent despite stopping the duloxetine. Her issues are very similar in presentation from the previous visit. I encouraged her to wait until the repeat TSH is performed and then we will decide what to do about the levothyroxine dose.

## 2021-12-16 ENCOUNTER — Other Ambulatory Visit (INDEPENDENT_AMBULATORY_CARE_PROVIDER_SITE_OTHER): Payer: Medicare PPO

## 2021-12-16 DIAGNOSIS — E039 Hypothyroidism, unspecified: Secondary | ICD-10-CM | POA: Diagnosis not present

## 2021-12-17 ENCOUNTER — Telehealth: Payer: Self-pay | Admitting: Family Medicine

## 2021-12-17 LAB — TSH: TSH: 3.9 u[IU]/mL (ref 0.35–5.50)

## 2021-12-17 NOTE — Telephone Encounter (Signed)
Since she feels like she is reacting then it is OK to have her stop the medication. Thanks!

## 2021-12-17 NOTE — Telephone Encounter (Signed)
Pt called to say she is having a reaction to the medication she is taking for her thyroids. Pt states her lips have ballooned and is wanting MD to send her something different to the pharmacy. Pt refused and OV. Pt was transferred to the Triage Nurse, in the interim.  Please advise.

## 2021-12-17 NOTE — Progress Notes (Signed)
She is going to stop the levothyroxine any way so we will recheck her TSH in 6 months

## 2021-12-18 DIAGNOSIS — S00522A Blister (nonthermal) of oral cavity, initial encounter: Secondary | ICD-10-CM | POA: Diagnosis not present

## 2021-12-18 DIAGNOSIS — R2681 Unsteadiness on feet: Secondary | ICD-10-CM | POA: Diagnosis not present

## 2021-12-18 DIAGNOSIS — Z6821 Body mass index (BMI) 21.0-21.9, adult: Secondary | ICD-10-CM | POA: Diagnosis not present

## 2021-12-18 DIAGNOSIS — Z780 Asymptomatic menopausal state: Secondary | ICD-10-CM | POA: Diagnosis not present

## 2021-12-18 DIAGNOSIS — Z0130 Encounter for examination of blood pressure without abnormal findings: Secondary | ICD-10-CM | POA: Diagnosis not present

## 2021-12-18 DIAGNOSIS — H579 Unspecified disorder of eye and adnexa: Secondary | ICD-10-CM | POA: Diagnosis not present

## 2021-12-18 DIAGNOSIS — T7840XA Allergy, unspecified, initial encounter: Secondary | ICD-10-CM | POA: Diagnosis not present

## 2021-12-18 DIAGNOSIS — E039 Hypothyroidism, unspecified: Secondary | ICD-10-CM | POA: Diagnosis not present

## 2021-12-18 DIAGNOSIS — M6281 Muscle weakness (generalized): Secondary | ICD-10-CM | POA: Diagnosis not present

## 2021-12-18 NOTE — Telephone Encounter (Signed)
Pt is calling back and she does not want to go to ER for her symptoms lip numbness  and tongue soreness and legs swollen . Pt talked with triage nurse. Pt would like joanne return her call

## 2021-12-18 NOTE — Telephone Encounter (Signed)
Left a detailed message with the information below at the patient's home number.

## 2021-12-18 NOTE — Telephone Encounter (Signed)
Spoke with the patient and informed her to go the ER or an urgent care immediately as our office recommends this for any patient with her symptoms.  Patient stated she feels this is due to the thyroid medication that was given by the nurse practitioner previously.  I advised the patient Dr Legrand Como was aware of the symptoms mentioned in the previous message and advised she stop taking the medication since she was reacting; however any patient with numbness in the tongue or lips should be seen immediately and the patient declined.  I again informed her she should go to the ER or an urgent care immediately, even if there was openings here in the office and offered to tell of her facilities near her address and she stated "she knows where to go".

## 2021-12-18 NOTE — Telephone Encounter (Signed)
Pt called returning CMA's call. CMA was unavailable. Pt stated she did not understand CMA's message and would like a call back.  [phone]

## 2021-12-21 DIAGNOSIS — Z6821 Body mass index (BMI) 21.0-21.9, adult: Secondary | ICD-10-CM | POA: Diagnosis not present

## 2021-12-21 DIAGNOSIS — R2681 Unsteadiness on feet: Secondary | ICD-10-CM | POA: Diagnosis not present

## 2021-12-21 DIAGNOSIS — M6281 Muscle weakness (generalized): Secondary | ICD-10-CM | POA: Diagnosis not present

## 2021-12-21 DIAGNOSIS — E039 Hypothyroidism, unspecified: Secondary | ICD-10-CM | POA: Diagnosis not present

## 2021-12-21 DIAGNOSIS — Z0130 Encounter for examination of blood pressure without abnormal findings: Secondary | ICD-10-CM | POA: Diagnosis not present

## 2021-12-21 DIAGNOSIS — H579 Unspecified disorder of eye and adnexa: Secondary | ICD-10-CM | POA: Diagnosis not present

## 2021-12-21 DIAGNOSIS — T7840XD Allergy, unspecified, subsequent encounter: Secondary | ICD-10-CM | POA: Diagnosis not present

## 2021-12-21 DIAGNOSIS — Z780 Asymptomatic menopausal state: Secondary | ICD-10-CM | POA: Diagnosis not present

## 2021-12-21 DIAGNOSIS — K120 Recurrent oral aphthae: Secondary | ICD-10-CM | POA: Diagnosis not present

## 2021-12-22 DIAGNOSIS — M9905 Segmental and somatic dysfunction of pelvic region: Secondary | ICD-10-CM | POA: Diagnosis not present

## 2021-12-22 DIAGNOSIS — M5136 Other intervertebral disc degeneration, lumbar region: Secondary | ICD-10-CM | POA: Diagnosis not present

## 2021-12-22 DIAGNOSIS — M9903 Segmental and somatic dysfunction of lumbar region: Secondary | ICD-10-CM | POA: Diagnosis not present

## 2021-12-22 DIAGNOSIS — M9904 Segmental and somatic dysfunction of sacral region: Secondary | ICD-10-CM | POA: Diagnosis not present

## 2021-12-29 DIAGNOSIS — Z6820 Body mass index (BMI) 20.0-20.9, adult: Secondary | ICD-10-CM | POA: Diagnosis not present

## 2021-12-29 DIAGNOSIS — N952 Postmenopausal atrophic vaginitis: Secondary | ICD-10-CM | POA: Diagnosis not present

## 2021-12-29 DIAGNOSIS — Z01419 Encounter for gynecological examination (general) (routine) without abnormal findings: Secondary | ICD-10-CM | POA: Diagnosis not present

## 2021-12-31 ENCOUNTER — Telehealth: Payer: Self-pay | Admitting: Neurology

## 2021-12-31 NOTE — Telephone Encounter (Signed)
LMOVM for patient to call the office back.

## 2021-12-31 NOTE — Telephone Encounter (Signed)
Pt states she has a burning sensation in legs.

## 2022-01-06 ENCOUNTER — Telehealth: Payer: Self-pay | Admitting: Family Medicine

## 2022-01-06 NOTE — Telephone Encounter (Signed)
Pt asking can she get a lower dosage of DULoxetine (CYMBALTA) 20 MG capsule because it is too strong for her. Requested possibly switching to zoloft. Pt also asking does she need to stay on her thyroid medication. Patient requesting a call

## 2022-01-06 NOTE — Telephone Encounter (Signed)
Unfortunately there isn't a lower dose-- I thought the patient was stopping these medications after our audio visit last month. She was supposed to have a repeat TSH done also.

## 2022-01-07 ENCOUNTER — Other Ambulatory Visit: Payer: Medicare PPO

## 2022-01-07 DIAGNOSIS — E039 Hypothyroidism, unspecified: Secondary | ICD-10-CM

## 2022-01-07 NOTE — Telephone Encounter (Signed)
Left a detailed message with the information below at the patient's cell number and advised she call to schedule a lab appt as below.

## 2022-01-07 NOTE — Telephone Encounter (Signed)
Pt came in for lab work; and wondered if she can be placed on Zoloft instead since she can't get a lower dose of Cymbalta. Pt would like a call with an answer. [phone]  Please advise.

## 2022-01-08 ENCOUNTER — Other Ambulatory Visit: Payer: Self-pay | Admitting: Family Medicine

## 2022-01-08 DIAGNOSIS — F419 Anxiety disorder, unspecified: Secondary | ICD-10-CM

## 2022-01-08 LAB — TSH: TSH: 4.09 u[IU]/mL (ref 0.35–5.50)

## 2022-01-08 MED ORDER — SERTRALINE HCL 25 MG PO TABS: 25.0000 mg | ORAL_TABLET | Freq: Every day | ORAL | 1 refills | Status: DC

## 2022-01-08 NOTE — Progress Notes (Signed)
TSH is normal -- did she stop the levothyroxine or is she still taking it? If she is taking it then please have her continue the medication. If not then we will just recheck it at her next visit

## 2022-01-08 NOTE — Telephone Encounter (Signed)
Spoke with the patient, informed her the Rx was sent as below and she will be contacted once the lab test results are received and reviewed by PCP.

## 2022-01-08 NOTE — Telephone Encounter (Signed)
Ok we can switch her to zoloft -- I will send in the rx

## 2022-01-15 ENCOUNTER — Telehealth: Payer: Self-pay | Admitting: Family Medicine

## 2022-01-15 NOTE — Telephone Encounter (Signed)
Noted  

## 2022-01-15 NOTE — Telephone Encounter (Signed)
FYI Pt would like the md to know she is no longer taking zoloft she did not like the way it made her feel and also she has an appt with dr Tomi Likens neurologist tomorrow

## 2022-01-16 ENCOUNTER — Encounter: Payer: Self-pay | Admitting: Neurology

## 2022-01-16 ENCOUNTER — Ambulatory Visit: Payer: Medicare PPO | Admitting: Neurology

## 2022-01-16 VITALS — BP 134/70 | HR 59 | Ht 61.0 in | Wt 108.8 lb

## 2022-01-16 DIAGNOSIS — G629 Polyneuropathy, unspecified: Secondary | ICD-10-CM | POA: Diagnosis not present

## 2022-01-16 MED ORDER — PREGABALIN 25 MG PO CAPS: ORAL_CAPSULE | ORAL | 0 refills | Status: DC

## 2022-01-16 NOTE — Patient Instructions (Signed)
Start pregablin '25mg'$  - take 1 pill at bedtime for one week, then 1 pill twice daily.  If no improvement by end of prescription, contact me and we can increase dose

## 2022-01-16 NOTE — Progress Notes (Signed)
NEUROLOGY FOLLOW UP OFFICE NOTE  Heidi Moore [id number]  Assessment/Plan:   Idiopathic polyneuropathy - patient does have evidence of an idiopathic polyneuropathy, but I am not certain her primary sensory symptoms (generalized itching, burning) including the torso is neuropathy.  Symptoms were of sudden onset and distribution not typical for a peripheral neuropathy.  May be anxiety.    Regardless, treatment would be symptomatic, which means use of certain antidepressants or antiepileptic medications.  She has already had side effects to multiple medications.   Will try low-dose pregablin '25mg'$  at bedtime for one week, then increase to '25mg'$  twice daily.  May titrate up accordingly.  If ineffective or has side effect, would then consider low dose nortriptyline. Follow up 6 months.  Subjective:  Heidi Moore is a 88 year old female whom I previously saw for dizziness presents today for neuropathy.  She is accompanied by her daughter who supplements history.   In 2020, she received allergy shots and immediately developed generalized itching, flushing and burning of her entire body involving arms, legs and torso.  She also has burning and numbness in legs from feet to knees.  Autoimmune workup (ANA negative, sed rate 12, CRP negative, CK 88) was negative.  B12 was 511.  TSH is elevated but T3 and T4 are normal.  She has osteoarthritis in the shoulder, hands and knees.  More recent labs reveal B12 403 and TSH 4.09.  Previously on gabapentin, Cymbalta, sertraline, each with reported side effects.  PAST MEDICAL HISTORY: Past Medical History:  Diagnosis Date   AC (acromioclavicular) joint bone spurs    on right shoulder    Allergic rhinitis    Arthritis    Bursitis    right shoulder   Complication of anesthesia    headaches   Diverticulosis    DIVERTICULOSIS, COLON 10/15/2006   Qualifier: Diagnosis of  By: Linna Darner MD, Lorretta Harp problem    treated by Dr Kathlen Mody per patient    GNOIBBCW(888.9)    sinus HA   History of blood transfusion 76yr ago   no abnormal reaction noted   History of cystitis    History of skin cancer    Basal cell, Dr. FLindwood Coke  Hyperlipidemia    [place] Study LDL goal =<160    Hypotension    Joint pain    Joint swelling    Macular degeneration, dry    Osteopenia    Intolerance to Calcium,Actonel,Fosamax,Evista, Forteo: S/P Boniva 2007 to 03/2010   OSTEOPENIA 03/12/2008   Qualifier: Diagnosis of  By: HVelora Heckler    Osteoporosis    PONV (postoperative nausea and vomiting)    Rosacea 04/22/2009   Qualifier: Diagnosis of  By: HLinna DarnerMD, William     Skin cancer 06/22/2017   per patient after excision right calf by Dr LUbaldo Glassing  SKIN CANCER, HX OF 03/12/2008   Qualifier: Diagnosis of  By: HVelora Heckler    Urinary frequency    Vaginal atrophy    sees Dr. TGertie Fey   MEDICATIONS: Current Outpatient Medications on File Prior to Visit  Medication Sig Dispense Refill   acetaminophen (TYLENOL) 325 MG tablet Take 650 mg by mouth every 6 (six) hours as needed for mild pain or headache.     Ascorbic Acid (VITAMIN C PO) Take 500 mg by mouth 2 (two) times daily.     azelastine (ASTELIN) 0.1 % nasal spray Place 2 sprays into both nostrils 2 (two) times  daily as needed for rhinitis. Use in each nostril as directed 30 mL 5   B Complex-C (B-COMPLEX WITH VITAMIN C) tablet Take 1 tablet by mouth daily.     cholecalciferol (VITAMIN D) 1000 UNITS tablet Take 1,000 Units by mouth 2 (two) times daily.      conjugated estrogens (PREMARIN) vaginal cream Place 1 Applicatorful vaginally daily. 42.5 g 12   estradiol (ESTRACE) 0.5 MG tablet Take 0.5 mg by mouth daily.      fexofenadine (ALLEGRA ALLERGY CHILDRENS) 30 MG/5ML suspension 1/4 tsp daily 118 mL 5   latanoprost (XALATAN) 0.005 % ophthalmic solution      neomycin-colistin-hydrocortisone-thonzonium (CORTISPORIN-TC) 3.07-11-08-0.5 MG/ML OTIC suspension Place 3 drops into both ears 3 (three) times  daily as needed (itching). 10 mL 0   OVER THE COUNTER MEDICATION Take 1 capsule by mouth 2 (two) times daily. [place]7 daily.     OVER THE COUNTER MEDICATION 2 (two) times daily. Preservision vitamin for eyes     sertraline (ZOLOFT) 25 MG tablet Take 1 tablet (25 mg total) by mouth daily. 90 tablet 1   triamcinolone (NASACORT) 55 MCG/ACT AERO nasal inhaler Place 1 spray into the nose daily. 1 each 5   vitamin E 400 UNIT capsule Take 1 capsule by mouth daily.     No current facility-administered medications on file prior to visit.    ALLERGIES: Allergies  Allergen Reactions   Alendronate Sodium Nausea And Vomiting   Anaplex Hd Nausea And Vomiting   Aspirin Nausea And Vomiting   Azithromycin Nausea And Vomiting   Calcium Nausea And Vomiting   Ciprofloxacin     Patient states this caused nausea, dizziness and affected her muscles   Codeine Nausea And Vomiting   Dorzolamide Hcl-Timolol Mal Other (See Comments)   Doxycycline Nausea And Vomiting   Gabapentin Other (See Comments)    Dizziness, Joint pain, blurred vision, changes in eyesight   Hydrocodone Nausea And Vomiting   Ibandronate Sodium     REACTION: severe body aches   Ibuprofen Nausea And Vomiting   Penicillins Nausea And Vomiting   Raloxifene Nausea And Vomiting   Risedronate Sodium Nausea And Vomiting   Sulfonamide Derivatives Nausea And Vomiting   Teriparatide Nausea And Vomiting   Travoprost     FAMILY HISTORY: Family History  Problem Relation Age of Onset   Stroke Mother 64   Lung cancer Sister    Diabetes Brother    Atrial fibrillation Brother        coumadin   Colon cancer Other        1st Cousin    Allergic rhinitis Son    Allergic rhinitis Daughter       Objective:  Blood pressure 134/70, pulse (!) 59, height '5\' 1"'$  (1.549 m), weight 108 lb 12.8 oz (49.4 kg), SpO2 100 %. General: No acute distress.  Patient appears well-groomed.   Head:  Normocephalic/atraumatic Eyes:  Fundi examined  but not visualized Neck: supple, no paraspinal tenderness, full range of motion Heart:  Regular rate and rhythm Lungs:  Clear to auscultation bilaterally Back: No paraspinal tenderness Neurological Exam: alert and oriented to person, place, and time.  Speech fluent and not dysarthric, language intact.  CN II-XII intact. Bulk and tone normal, muscle strength 5/5 throughout.  Sensation to light touch intact.  Deep tendon reflexes 1+ throughout.  Finger to nose testing intact.  Gait steady   Metta Clines, DO  CC: Loralyn Freshwater, MD

## 2022-01-19 ENCOUNTER — Telehealth: Payer: Self-pay | Admitting: Neurology

## 2022-01-19 MED ORDER — NORTRIPTYLINE HCL 10 MG PO CAPS: 10.0000 mg | ORAL_CAPSULE | Freq: Every day | ORAL | 0 refills | Status: DC

## 2022-01-19 NOTE — Telephone Encounter (Signed)
Pt called in stating she had a reaction to her pregabalin medication Saturday. Her lips were swollen, her tongue was burning, her muscles felt numb, and she thinks her ears were swollen too due to not being able to hear as well. She took an Human resources officer and it helped. She doesn't want to take it anymore.

## 2022-01-19 NOTE — Telephone Encounter (Signed)
Patient advised of Dr.Jaffe note, If patient agreeable, please send prescription for nortriptyline '10mg'$  at bedtime.  Nortiptyline 10 mg bedtime.

## 2022-01-28 ENCOUNTER — Ambulatory Visit: Payer: Medicare PPO | Admitting: Neurology

## 2022-02-10 DIAGNOSIS — M9905 Segmental and somatic dysfunction of pelvic region: Secondary | ICD-10-CM | POA: Diagnosis not present

## 2022-02-10 DIAGNOSIS — M9903 Segmental and somatic dysfunction of lumbar region: Secondary | ICD-10-CM | POA: Diagnosis not present

## 2022-02-10 DIAGNOSIS — M5136 Other intervertebral disc degeneration, lumbar region: Secondary | ICD-10-CM | POA: Diagnosis not present

## 2022-02-10 DIAGNOSIS — M9904 Segmental and somatic dysfunction of sacral region: Secondary | ICD-10-CM | POA: Diagnosis not present

## 2022-03-09 ENCOUNTER — Ambulatory Visit: Payer: Medicare PPO | Admitting: Family Medicine

## 2022-03-10 ENCOUNTER — Ambulatory Visit: Payer: Medicare PPO | Admitting: Family Medicine

## 2022-03-10 ENCOUNTER — Encounter: Payer: Self-pay | Admitting: Family Medicine

## 2022-03-10 VITALS — BP 110/60 | HR 62 | Temp 97.6°F | Ht 61.0 in | Wt 109.4 lb

## 2022-03-10 DIAGNOSIS — G609 Hereditary and idiopathic neuropathy, unspecified: Secondary | ICD-10-CM | POA: Diagnosis not present

## 2022-03-10 DIAGNOSIS — E039 Hypothyroidism, unspecified: Secondary | ICD-10-CM | POA: Diagnosis not present

## 2022-03-10 LAB — POCT GLYCOSYLATED HEMOGLOBIN (HGB A1C): Hemoglobin A1C: 5.2 % (ref 4.0–5.6)

## 2022-03-10 NOTE — Assessment & Plan Note (Signed)
patinet has been off of her levothyroxine for the last 2 months, will recheck her TSH today.

## 2022-03-10 NOTE — Assessment & Plan Note (Signed)
A1C is negative for diabetes today. Pt was reassured. She saw Dr. Tomi Likens and had reactions to the medications he tried to treat her with. Pt states her symptoms are tolerable, will continue to monitor.

## 2022-03-10 NOTE — Progress Notes (Signed)
Established Patient Office Visit  Subjective   Patient ID: Heidi Moore, female    DOB: 1919-06-29  Age: 88 y.o. MRN: [id number]  Chief Complaint  Patient presents with   Follow-up    Patient is here for follow up. Patient states that she went back to see Dr. Tomi Likens and he started her on pregabalin 25 mg at bedtime. States that she reacted to the medication. Patient was then tried on nortriptyline 10 mg at bedtime and she reacted to this as well. States she is also off the sertraline 25 mg. Pt states that the only thing that helps is the estrogen. States she has been on estrogen since her hysterectomy at age 38, states that without it she gets very irritable and starts crying. Patient also stopped taking the levothyroxine also.   Allergies-- patient states she went to a different allergist, was given allegra but she is not taking it, just taking the  nasal spray. States that it seems to help her sinuses.    Current Outpatient Medications  Medication Instructions   acetaminophen (TYLENOL) 650 mg, Oral, Every 6 hours PRN   Ascorbic Acid (VITAMIN C PO) 500 mg, Oral, 2 times daily   azelastine (ASTELIN) 0.1 % nasal spray 2 sprays, Each Nare, 2 times daily PRN, Use in each nostril as directed   B Complex-C (B-COMPLEX WITH VITAMIN C) tablet 1 tablet, Daily   cholecalciferol (VITAMIN D) 1,000 Units, Oral, 2 times daily   estradiol (ESTRACE) 0.5 mg, Oral, Daily   fexofenadine (ALLEGRA ALLERGY CHILDRENS) 30 MG/5ML suspension 1/4 tsp daily   IBUPROFEN PO Oral, 2 times daily   latanoprost (XALATAN) 0.005 % ophthalmic solution No dose, route, or frequency recorded.   neomycin-colistin-hydrocortisone-thonzonium (CORTISPORIN-TC) 3.07-11-08-0.5 MG/ML OTIC suspension 3 drops, Both EARS, 3 times daily PRN   OVER THE COUNTER MEDICATION 1 capsule, 2 times daily   OVER THE COUNTER MEDICATION 2 times daily, Preservision vitamin for eyes    triamcinolone (NASACORT) 55 MCG/ACT AERO nasal inhaler 1 spray,  Nasal, Daily   VITAMIN A PO Oral, Daily   vitamin E 400 UNIT capsule 1 capsule, Oral, Daily    Patient Active Problem List   Diagnosis Date Noted   Dizziness 12/12/2021   Dysuria 12/04/2021   Idiopathic polyneuropathy 12/04/2021   Hypothyroidism 12/04/2021   Optic atrophy of both eyes 12/10/2020   Intermediate stage nonexudative age-related macular degeneration of both eyes 05/21/2020   Primary open angle glaucoma of both eyes, severe stage 05/21/2020   Scarring, chorioretinal, 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 Brandy Hale 08/24/2012   Hot flashes - followed by gyn, Dr. Gertie Fey 08/24/2012   Osteoarthritis of left knee 05/17/2012    Class: Diagnosis of   LOW BACK PAIN SYNDROME 05/07/2010   Vitamin D deficiency 04/22/2009   Allergic rhinitis 03/12/2008   Hyperlipemia 10/22/2006      Review of Systems  All other systems reviewed and are negative.     Objective:     BP 110/60 (BP Location: Left Arm, Patient Position: Sitting, Cuff Size: Normal)   Pulse 62   Temp 97.6 F (36.4 C) (Oral)   Ht '5\' 1"'$  (1.549 m)   Wt 109 lb 6.4 oz (49.6 kg)   SpO2 100%   BMI 20.67 kg/m    Physical Exam Vitals reviewed.  Constitutional:  Appearance: Normal appearance. She is well-groomed and normal weight.  Eyes:     Conjunctiva/sclera: Conjunctivae normal.  Neck:     Thyroid: No thyromegaly.  Cardiovascular:     Rate and Rhythm: Normal rate and regular rhythm.     Pulses: Normal pulses.     Heart sounds: S1 normal and S2 normal.  Pulmonary:     Effort: Pulmonary effort is normal.     Breath sounds: Normal breath sounds and air entry.  Abdominal:     General: Bowel sounds are normal.  Musculoskeletal:     Right lower leg: Edema (1+ edema in the ankles BL) present.     Left lower leg: Edema present.   Neurological:     Mental Status: She is alert and oriented to person, place, and time. Mental status is at baseline.     Gait: Gait is intact.  Psychiatric:        Mood and Affect: Mood and affect normal.        Speech: Speech normal.        Behavior: Behavior normal.        Judgment: Judgment normal.      Results for orders placed or performed in visit on 03/10/22  POC HgB A1c  Result Value Ref Range   Hemoglobin A1C 5.2 4.0 - 5.6 %   HbA1c POC (<> result, manual entry)     HbA1c, POC (prediabetic range)     HbA1c, POC (controlled diabetic range)        The ASCVD Risk score (Arnett DK, et al., 2019) failed to calculate for the following reasons:   The 2019 ASCVD risk score is only valid for ages 39 to 64    Assessment & Plan:   Problem List Items Addressed This Visit       Endocrine   Hypothyroidism - Primary (Chronic)    patinet has been off of her levothyroxine for the last 2 months, will recheck her TSH today.      Relevant Orders   TSH     Nervous and Auditory   Idiopathic polyneuropathy    A1C is negative for diabetes today. Pt was reassured. She saw Dr. Tomi Likens and had reactions to the medications he tried to treat her with. Pt states her symptoms are tolerable, will continue to monitor.       Relevant Orders   POC HgB A1c (Completed)    Return in about 6 months (around 09/08/2022) for follow up.    Farrel Conners, MD

## 2022-03-11 LAB — TSH: TSH: 3.69 u[IU]/mL (ref 0.35–5.50)

## 2022-03-12 NOTE — Progress Notes (Signed)
TSH is normal, ok to stay off the levothyroxine.

## 2022-03-16 ENCOUNTER — Encounter: Payer: Medicare PPO | Admitting: Internal Medicine

## 2022-03-30 ENCOUNTER — Other Ambulatory Visit: Payer: Self-pay | Admitting: *Deleted

## 2022-03-30 DIAGNOSIS — M9904 Segmental and somatic dysfunction of sacral region: Secondary | ICD-10-CM | POA: Diagnosis not present

## 2022-03-30 DIAGNOSIS — M9903 Segmental and somatic dysfunction of lumbar region: Secondary | ICD-10-CM | POA: Diagnosis not present

## 2022-03-30 DIAGNOSIS — M5136 Other intervertebral disc degeneration, lumbar region: Secondary | ICD-10-CM | POA: Diagnosis not present

## 2022-03-30 DIAGNOSIS — M9905 Segmental and somatic dysfunction of pelvic region: Secondary | ICD-10-CM | POA: Diagnosis not present

## 2022-03-31 MED ORDER — CORTISPORIN-TC 3.3-3-10-0.5 MG/ML OT SUSP: 3.0000 [drp] | Freq: Three times a day (TID) | OTIC | 0 refills | Status: DC | PRN

## 2022-04-01 ENCOUNTER — Telehealth: Payer: Self-pay

## 2022-04-01 DIAGNOSIS — H60509 Unspecified acute noninfective otitis externa, unspecified ear: Secondary | ICD-10-CM | POA: Diagnosis not present

## 2022-04-01 NOTE — Telephone Encounter (Signed)
--  Caller states she is having right ear pain and it is swollen, no fever. She states there are no appointments at the office and she has already made an appointment with [facility] for 10:45 today. Caller disconnected after offering triage services.  04/01/2022 9:23:25 AM Clinical Call [place], RN, Vincente [name]

## 2022-04-15 DIAGNOSIS — H04123 Dry eye syndrome of bilateral lacrimal glands: Secondary | ICD-10-CM | POA: Diagnosis not present

## 2022-04-15 DIAGNOSIS — H401133 Primary open-angle glaucoma, bilateral, severe stage: Secondary | ICD-10-CM | POA: Diagnosis not present

## 2022-04-21 DIAGNOSIS — M9905 Segmental and somatic dysfunction of pelvic region: Secondary | ICD-10-CM | POA: Diagnosis not present

## 2022-04-21 DIAGNOSIS — M9904 Segmental and somatic dysfunction of sacral region: Secondary | ICD-10-CM | POA: Diagnosis not present

## 2022-04-21 DIAGNOSIS — M9903 Segmental and somatic dysfunction of lumbar region: Secondary | ICD-10-CM | POA: Diagnosis not present

## 2022-04-21 DIAGNOSIS — M5136 Other intervertebral disc degeneration, lumbar region: Secondary | ICD-10-CM | POA: Diagnosis not present

## 2022-05-13 DIAGNOSIS — M9905 Segmental and somatic dysfunction of pelvic region: Secondary | ICD-10-CM | POA: Diagnosis not present

## 2022-05-13 DIAGNOSIS — M5136 Other intervertebral disc degeneration, lumbar region: Secondary | ICD-10-CM | POA: Diagnosis not present

## 2022-05-13 DIAGNOSIS — M9903 Segmental and somatic dysfunction of lumbar region: Secondary | ICD-10-CM | POA: Diagnosis not present

## 2022-05-13 DIAGNOSIS — M9904 Segmental and somatic dysfunction of sacral region: Secondary | ICD-10-CM | POA: Diagnosis not present

## 2022-05-14 ENCOUNTER — Telehealth: Payer: Self-pay | Admitting: Family Medicine

## 2022-05-14 NOTE — Telephone Encounter (Signed)
Seeking advice on whether she should take the RVS shot. 04/01/22 had bad ear infection was treated with neomycin-colistin-hydrocortisone-thonzonium (CORTISPORIN-TC) 3.07-11-08-0.5 MG/ML OTIC suspension. Requesting a refill, declined OV.

## 2022-05-14 NOTE — Telephone Encounter (Signed)
Yes it's ok to take the RSV vaccine-- is she still having pain in her ear-- usually we just treat for 7 days then stop the ear drops.Heidi KitchenMarland KitchenMarland Moore

## 2022-05-14 NOTE — Telephone Encounter (Signed)
No answer at the patient's home number.

## 2022-05-15 NOTE — Telephone Encounter (Signed)
Left a detailed message with the information below at the patient's home number and requested she call back to let Dr Legrand Como know if she is still having ear pain.

## 2022-05-25 ENCOUNTER — Ambulatory Visit: Payer: Medicare PPO | Admitting: Family Medicine

## 2022-05-25 ENCOUNTER — Encounter: Payer: Self-pay | Admitting: Family Medicine

## 2022-05-25 VITALS — BP 120/60 | HR 80 | Temp 98.0°F | Ht 61.0 in | Wt 106.5 lb

## 2022-05-25 DIAGNOSIS — F419 Anxiety disorder, unspecified: Secondary | ICD-10-CM | POA: Diagnosis not present

## 2022-05-25 DIAGNOSIS — H608X3 Other otitis externa, bilateral: Secondary | ICD-10-CM

## 2022-05-25 MED ORDER — HYDROCORTISONE-ACETIC ACID 1-2 % OT SOLN: 3.0000 [drp] | Freq: Two times a day (BID) | OTIC | 2 refills | Status: AC

## 2022-05-25 MED ORDER — CITALOPRAM HYDROBROMIDE 10 MG PO TABS: ORAL_TABLET | ORAL | 0 refills | Status: DC

## 2022-05-25 NOTE — Assessment & Plan Note (Signed)
Chronic, exacerbated over the holidays, with difficulty sleeping, we had tried cymbalta in the past but she had side effects, will try a small dose of citalopram 5 mg for 7 days, then if she tolerates this will increase to 10 mg daily. I will see her back in about 6 weeks to re-evaluate her symptoms. I discussed the side effects of the medication with the patient but I advised she at least try the medication for 2 weeks to see if the side effects dissipate the longer she is on the medication. I also recommended melatonin at bedtime, starting with 5 mg and she may increase up to 20 mg at bedtime if needed.
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
## 2022-05-25 NOTE — Assessment & Plan Note (Signed)
BL EAC"s are dry and flaky, no signs of erythema or infection. I recommended acetic acid/ hydrocortisone drops for the itching.

## 2022-05-25 NOTE — Patient Instructions (Signed)
Melatonin -- can take up to 20 mg of melatonin at bedtime.   Citalopram-- start with 5 mg daily at bedtime, then after 7 days increase to 10 mg at bedtime.

## 2022-06-04 ENCOUNTER — Telehealth: Payer: Self-pay | Admitting: *Deleted

## 2022-06-04 ENCOUNTER — Telehealth: Payer: Self-pay | Admitting: Family Medicine

## 2022-06-04 NOTE — Telephone Encounter (Signed)
As per Triage Nurse, Pt is saying the Citalopram is making her very sick and she wants to stop taking it.  Please advise.

## 2022-06-04 NOTE — Telephone Encounter (Signed)
Spoke with the patient and informed her of the message below.  

## 2022-06-04 NOTE — Telephone Encounter (Signed)
-----  Message from Lennox Solders sent at 06/04/2022  2:21 PM EST ----- Please abstract and route to provider.

## 2022-06-04 NOTE — Telephone Encounter (Signed)
Ok to stop the citalopram

## 2022-06-17 DIAGNOSIS — M9904 Segmental and somatic dysfunction of sacral region: Secondary | ICD-10-CM | POA: Diagnosis not present

## 2022-06-17 DIAGNOSIS — M9903 Segmental and somatic dysfunction of lumbar region: Secondary | ICD-10-CM | POA: Diagnosis not present

## 2022-06-17 DIAGNOSIS — M9905 Segmental and somatic dysfunction of pelvic region: Secondary | ICD-10-CM | POA: Diagnosis not present

## 2022-06-17 DIAGNOSIS — M5136 Other intervertebral disc degeneration, lumbar region: Secondary | ICD-10-CM | POA: Diagnosis not present

## 2022-07-10 ENCOUNTER — Ambulatory Visit (INDEPENDENT_AMBULATORY_CARE_PROVIDER_SITE_OTHER): Payer: Medicare PPO

## 2022-07-10 VITALS — Ht 61.0 in | Wt 108.0 lb

## 2022-07-10 DIAGNOSIS — Z0000 Encounter for general adult medical examination without abnormal findings: Secondary | ICD-10-CM

## 2022-07-10 NOTE — Patient Instructions (Addendum)
Heidi Moore , Thank you for taking time to come for your Medicare Wellness Visit. I appreciate your ongoing commitment to your health goals. Please review the following plan we discussed and let me know if I can assist you in the future.   These are the goals we discussed:  Goals       Exercise 150 min/wk Moderate Activity (pt-stated)      Exercising per Dr. Lorin [name] recommendation to strengthen her quads Continue with yard work.      Stay Healthy (pt-stated)        This is a list of the screening recommended for you and due dates:  Health Maintenance  Topic Date Due   COVID-19 Vaccine (5 - 2023-24 season) 07/26/2022*   Medicare Annual Wellness Visit  07/10/2023   DTaP/Tdap/Td vaccine (3 - Td or Tdap) 10/31/2023   Pneumonia Vaccine  Completed   Flu Shot  Completed   DEXA scan (bone density measurement)  Completed   Zoster (Shingles) Vaccine  Completed   HPV Vaccine  Aged Out  *Topic was postponed. The date shown is not the original due date.    Advanced directives: Please bring a copy of your health care power of attorney and living will to the office to be added to your chart at your convenience.   Conditions/risks identified: None  Next appointment: Follow up in one year for your annual wellness visit    Preventive Care 65 Years and Older, Female Preventive care refers to lifestyle choices and visits with your health care provider that can promote health and wellness. What does preventive care include? A yearly physical exam. This is also called an annual well check. Dental exams once or twice a year. Routine eye exams. Ask your health care provider how often you should have your eyes checked. Personal lifestyle choices, including: Daily care of your teeth and gums. Regular physical activity. Eating a healthy diet. Avoiding tobacco and drug use. Limiting alcohol use. Practicing safe sex. Taking low-dose aspirin every day. Taking vitamin and mineral supplements as  recommended by your health care provider. What happens during an annual well check? The services and screenings done by your health care provider during your annual well check will depend on your age, overall health, lifestyle risk factors, and family history of disease. Counseling  Your health care provider may ask you questions about your: Alcohol use. Tobacco use. Drug use. Emotional well-being. Home and relationship well-being. Sexual activity. Eating habits. History of falls. Memory and ability to understand (cognition). Work and work Statistician. Reproductive health. Screening  You may have the following tests or measurements: Height, weight, and BMI. Blood pressure. Lipid and cholesterol levels. These may be checked every 5 years, or more frequently if you are over 29 years old. Skin check. Lung cancer screening. You may have this screening every year starting at age 67 if you have a 30-pack-year history of smoking and currently smoke or have quit within the past 15 years. Fecal occult blood test (FOBT) of the stool. You may have this test every year starting at age 21. Flexible sigmoidoscopy or colonoscopy. You may have a sigmoidoscopy every 5 years or a colonoscopy every 10 years starting at age 34. Hepatitis C blood test. Hepatitis B blood test. Sexually transmitted disease (STD) testing. Diabetes screening. This is done by checking your blood sugar (glucose) after you have not eaten for a while (fasting). You may have this done every 1-3 years. Bone density scan. This is done to screen  for osteoporosis. You may have this done starting at age 36. Mammogram. This may be done every 1-2 years. Talk to your health care provider about how often you should have regular mammograms. Talk with your health care provider about your test results, treatment options, and if necessary, the need for more tests. Vaccines  Your health care provider may recommend certain vaccines, such  as: Influenza vaccine. This is recommended every year. Tetanus, diphtheria, and acellular pertussis (Tdap, Td) vaccine. You may need a Td booster every 10 years. Zoster vaccine. You may need this after age 63. Pneumococcal 13-valent conjugate (PCV13) vaccine. One dose is recommended after age 30. Pneumococcal polysaccharide (PPSV23) vaccine. One dose is recommended after age 36. Talk to your health care provider about which screenings and vaccines you need and how often you need them. This information is not intended to replace advice given to you by your health care provider. Make sure you discuss any questions you have with your health care provider. Document Released: 05/24/2015 Document Revised: 01/15/2016 Document Reviewed: 02/26/2015 Elsevier Interactive Patient Education  2017 [place] Prevention in the Home Falls can cause injuries. They can happen to people of all ages. There are many things you can do to make your home safe and to help prevent falls. What can I do on the outside of my home? Regularly fix the edges of walkways and driveways and fix any cracks. Remove anything that might make you trip as you walk through a door, such as a raised step or threshold. Trim any bushes or trees on the path to your home. Use bright outdoor lighting. Clear any walking paths of anything that might make someone trip, such as rocks or tools. Regularly check to see if handrails are loose or broken. Make sure that both sides of any steps have handrails. Any raised decks and porches should have guardrails on the edges. Have any leaves, snow, or ice cleared regularly. Use sand or salt on walking paths during winter. Clean up any spills in your garage right away. This includes oil or grease spills. What can I do in the bathroom? Use night lights. Install grab bars by the toilet and in the tub and shower. Do not use towel bars as grab bars. Use non-skid mats or decals in the tub or  shower. If you need to sit down in the shower, use a plastic, non-slip stool. Keep the floor dry. Clean up any water that spills on the floor as soon as it happens. Remove soap buildup in the tub or shower regularly. Attach bath mats securely with double-sided non-slip rug tape. Do not have throw rugs and other things on the floor that can make you trip. What can I do in the bedroom? Use night lights. Make sure that you have a light by your bed that is easy to reach. Do not use any sheets or blankets that are too big for your bed. They should not hang down onto the floor. Have a firm chair that has side arms. You can use this for support while you get dressed. Do not have throw rugs and other things on the floor that can make you trip. What can I do in the kitchen? Clean up any spills right away. Avoid walking on wet floors. Keep items that you use a lot in easy-to-reach places. If you need to reach something above you, use a strong step stool that has a grab bar. Keep electrical cords out of the way. Do  not use floor polish or wax that makes floors slippery. If you must use wax, use non-skid floor wax. Do not have throw rugs and other things on the floor that can make you trip. What can I do with my stairs? Do not leave any items on the stairs. Make sure that there are handrails on both sides of the stairs and use them. Fix handrails that are broken or loose. Make sure that handrails are as long as the stairways. Check any carpeting to make sure that it is firmly attached to the stairs. Fix any carpet that is loose or worn. Avoid having throw rugs at the top or bottom of the stairs. If you do have throw rugs, attach them to the floor with carpet tape. Make sure that you have a light switch at the top of the stairs and the bottom of the stairs. If you do not have them, ask someone to add them for you. What else can I do to help prevent falls? Wear shoes that: Do not have high heels. Have  rubber bottoms. Are comfortable and fit you well. Are closed at the toe. Do not wear sandals. If you use a stepladder: Make sure that it is fully opened. Do not climb a closed stepladder. Make sure that both sides of the stepladder are locked into place. Ask someone to hold it for you, if possible. Clearly mark and make sure that you can see: Any grab bars or handrails. First and last steps. Where the edge of each step is. Use tools that help you move around (mobility aids) if they are needed. These include: Canes. Walkers. Scooters. Crutches. Turn on the lights when you go into a dark area. Replace any light bulbs as soon as they burn out. Set up your furniture so you have a clear path. Avoid moving your furniture around. If any of your floors are uneven, fix them. If there are any pets around you, be aware of where they are. Review your medicines with your doctor. Some medicines can make you feel dizzy. This can increase your chance of falling. Ask your doctor what other things that you can do to help prevent falls. This information is not intended to replace advice given to you by your health care provider. Make sure you discuss any questions you have with your health care provider. Document Released: 02/21/2009 Document Revised: 10/03/2015 Document Reviewed: 06/01/2014 Elsevier Interactive Patient Education  2017 [employer].

## 2022-07-10 NOTE — Progress Notes (Signed)
Subjective:   Heidi Moore is a 89 y.o. female who presents for Medicare Annual (Subsequent) preventive examination.  Review of Systems    Virtual Visit via Telephone Note  I connected with  Heidi Moore on 07/10/22 at 11:30 AM EST by telephone and verified that I am speaking with the correct person using two identifiers.  Location: Patient: Home Provider: Office Persons participating in the virtual visit: patient/Nurse Health Advisor   I discussed the limitations, risks, security and privacy concerns of performing an evaluation and management service by telephone and the availability of in person appointments. The patient expressed understanding and agreed to proceed.  Interactive audio and video telecommunications were attempted between this nurse and patient, however failed, due to patient having technical difficulties OR patient did not have access to video capability.  We continued and completed visit with audio only.  Some vital signs may be absent or patient reported.   [name], LPN  Cardiac Risk Factors include: advanced age (>71mn, >>38women)     Objective:    Today's Vitals   07/10/22 1138  Weight: 108 lb (49 kg)  Height: '5\' 1"'$  (1.549 m)   Body mass index is 20.41 kg/m.     07/10/2022   11:49 AM 01/16/2022    9:36 AM 07/08/2021   11:23 AM 04/19/2020   10:50 AM 03/06/2020    3:10 PM 04/05/2018   11:46 AM 03/25/2017    1:33 PM  Advanced Directives  Does Patient Have a Medical Advance Directive? Yes Yes Yes Yes Yes Yes Yes  Type of [place]aramed[place]of [place] will [place] will [place]. [place]Living will [place]Living will H[place]cad[place]Living will    Does patient want to make changes to medical advance directive?   No - Patient declined  No - Patient declined    Copy of H[place]in Chart? No - copy requested  No - copy requested   Yes - validated most recent copy scanned in chart (See row information)      Current Medications (verified) Outpatient Encounter Medications as of 07/10/2022  Medication Sig   acetaminophen (TYLENOL) 325 MG tablet Take 650 mg by mouth every 6 (six) hours as needed for mild pain or headache.   acetic acid-hydrocortisone (VOSOL-HC) OTIC solution Place 3 drops into both ears 2 (two) times daily.   Ascorbic Acid (VITAMIN C PO) Take 500 mg by mouth 2 (two) times daily.   azelastine (ASTELIN) 0.1 % nasal spray Place 2 sprays into both nostrils 2 (two) times daily as needed for rhinitis. Use in each nostril as directed   B Complex-C (B-COMPLEX WITH VITAMIN C) tablet Take 1 tablet by mouth daily.   cholecalciferol (VITAMIN D) 1000 UNITS tablet Take 1,000 Units by mouth 2 (two) times daily.    citalopram (CELEXA) 10 MG tablet Start with 1/2 tablet daily at bedtime for 7 days, then increase to 1 tablet daily at bedtime.   estradiol (ESTRACE) 0.5 MG tablet Take 0.5 mg by mouth daily.   fexofenadine (ALLEGRA ALLERGY CHILDRENS) 30 MG/5ML suspension 1/4 tsp daily   IBUPROFEN PO Take by mouth in the morning and at bedtime.   latanoprost (XALATAN) 0.005 % ophthalmic solution    OVER THE COUNTER MEDICATION Take 1 capsule by mouth 2 (two) times daily. [place] daily.   OVER THE COUNTER MEDICATION 2 (two) times daily. Preservision vitamin for eyes  triamcinolone (NASACORT) 55 MCG/ACT AERO nasal inhaler Place 1 spray into the nose daily.   VITAMIN A PO Take by mouth daily.   vitamin E 400 UNIT capsule Take 1 capsule by mouth daily.   No facility-administered encounter medications on file as of 07/10/2022.    Allergies (verified) Alendronate sodium, Anaplex hd, Aspirin, Azithromycin, Calcium, Ciprofloxacin, Codeine, Dorzolamide hcl-timolol mal, Doxycycline, Gabapentin, Hydrocodone, Ibandronate sodium, Ibuprofen, Penicillins, Raloxifene, Risedronate sodium, Sulfonamide derivatives, Teriparatide, and  Travoprost   History: Past Medical History:  Diagnosis Date   AC (acromioclavicular) joint bone spurs    on right shoulder    Allergic rhinitis    Arthritis    Bursitis    right shoulder   Complication of anesthesia    headaches   Diverticulosis    DIVERTICULOSIS, COLON 10/15/2006   Qualifier: Diagnosis of  By: Linna Darner MD, Lorretta Harp problem    treated by Dr Kathlen Mody per patient   [id number])    sinus HA   History of blood transfusion 83yr ago   no abnormal reaction noted   History of cystitis    History of skin cancer    Basal cell, Dr. FLindwood Coke  Hyperlipidemia    [place] Study LDL goal =<160    Hypotension    Joint pain    Joint swelling    Macular degeneration, dry    Osteopenia    Intolerance to Calcium,Actonel,Fosamax,Evista, Forteo: S/P Boniva 2007 to 03/2010   OSTEOPENIA 03/12/2008   Qualifier: Diagnosis of  By: HVelora Heckler    Osteoporosis    PONV (postoperative nausea and vomiting)    Rosacea 04/22/2009   Qualifier: Diagnosis of  By: HLinna DarnerMD, William     Skin cancer 06/22/2017   per patient after excision right calf by Dr LUbaldo Glassing  SKIN CANCER, HX OF 03/12/2008   Qualifier: Diagnosis of  By: HVelora Heckler    Urinary frequency    Vaginal atrophy    sees Dr. TGertie Fey  Past Surgical History:  Procedure Laterality Date   APPENDECTOMY     BUNIONECTOMY Bilateral    CATARACT EXTRACTION, BILATERAL     COLONOSCOPY     with Tics (no further f/u as per GI due to age)    JOINT REPLACEMENT Left    knee    KNEE ARTHROPLASTY  05/18/2012   Procedure: COMPUTER ASSISTED TOTAL KNEE ARTHROPLASTY;  Surgeon: MMarybelle Killings MD;  [facility]  Service: Orthopedics;  Laterality: Left;  Left Total Knee Arthroplasty, Cemented, Computer Assisted   lt knee      meniscus tear   MOHS SURGERY     right nostril for basel cell cancer    NOSE SURGERY     due to trama at age 89   TOTAL ABDOMINAL HYSTERECTOMY W/ BILATERAL SALPINGOOPHORECTOMY     for  Endometriosis    TOTAL SHOULDER ARTHROPLASTY Right 09/11/2013   Procedure: TOTAL SHOULDER ARTHROPLASTY;  Surgeon: MMarybelle Killings MD;  [facility] [facility]  Service: Orthopedics;  Laterality: Right;  Right Total Shoulder Arthroplasty   Family History  Problem Relation Age of Onset   Stroke Mother 820  Lung cancer Sister    Diabetes Brother    Atrial fibrillation Brother        coumadin   Colon cancer Other        1st Cousin    Allergic rhinitis Son    Allergic rhinitis Daughter    Social History  Socioeconomic History   Marital status: Widowed    Spouse name: Not on file   Number of children: 3   Years of education: Not on file   Highest education level: Some college, no degree  Occupational History    Employer: RETIRED  Tobacco Use   Smoking status: Never    Passive exposure: Never   Smokeless tobacco: Never  Vaping Use   Vaping Use: Never used  Substance and Sexual Activity   Alcohol use: No   Drug use: No   Sexual activity: Not Currently    Birth control/protection: Surgical  Other Topics Concern   Not on file  Social History Narrative   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 She lives alone. She goes to the gym and participates in aerobics 3 x week. She drinks 3 cups of coffee a day.         Social Determinants of Health   Financial Resource Strain: Low Risk  (07/10/2022)   Overall Financial Resource Strain (CARDIA)    Difficulty of Paying Living Expenses: Not hard at all  Food Insecurity: No Food Insecurity (07/10/2022)   Hunger Vital Sign     Worried About Running Out of Food in the Last Year: Never true    Ran Out of Food in the Last Year: Never true  Transportation Needs: No Transportation Needs (07/10/2022)   PRAPARE - Hydrologist (Medical): No    Lack of Transportation (Non-Medical): No  Recent Concern: Transportation Needs - Unmet Transportation Needs (05/24/2022)   PRAPARE - Hydrologist (Medical): Yes    Lack of Transportation (Non-Medical): No  Physical Activity: Insufficiently Active (07/10/2022)   Exercise Vital Sign    Days of Exercise per Week: 2 days    Minutes of Exercise per Session: 60 min  Stress: No Stress Concern Present (07/10/2022)   [place]    Feeling of Stress : Not at all  Recent Concern: Stress - Stress Concern Present (05/24/2022)   [place]    Feeling of Stress : Very much  Social Connections: Moderately Integrated (07/10/2022)   Social Connection and Isolation Panel [NHANES]    Frequency of Communication with Friends and Family: More than three times a week    Frequency of Social Gatherings with Friends and Family: More than three times a week    Attends Religious Services: More than 4 times per year    Active Member of [employer] or Organizations: Yes    Attends Archivist Meetings: More than 4 times per year    Marital Status: Widowed  Recent Concern: Social Connections - Moderately Isolated (05/24/2022)   Social Connection and Isolation Panel [NHANES]    Frequency of Communication with Friends and Family: More than three times a week    Frequency of Social Gatherings with Friends and Family: More than three times a  week    Attends Religious Services: Never    Active Member of Clubs or Organizations: Yes    Attends Archivist Meetings: 1 to 4 times per year    Marital Status: Widowed     Tobacco Counseling Counseling given: No   Clinical Intake:  Pre-visit preparation completed: No  Pain : No/denies pain     BMI - recorded: 20.41 Nutritional Status: BMI of 19-24  Normal Nutritional Risks: None Diabetes: No  How often do you need to have someone help you when you read instructions, pamphlets, or other written materials from your doctor or pharmacy?: 1 - Never  Diabetic?  No  Interpreter Needed?: No  Information entered by :: Rolene Arbour LPN   Activities of Daily Living    07/10/2022   11:46 AM  In your present state of health, do you have any difficulty performing the following activities:  Hearing? 1  Comment Wears hearing aids  Vision? 0  Difficulty concentrating or making decisions? 0  Walking or climbing stairs? 0  Dressing or bathing? 0  Doing errands, shopping? 0  Preparing Food and eating ? N  Using the Toilet? N  In the past six months, have you accidently leaked urine? Y  Comment Wears pads. Followed by PCP  Do you have problems with loss of bowel control? N  Managing your Medications? N  Managing your Finances? N  Housekeeping or managing your Housekeeping? N    Patient Care Team: Farrel Conners, MD as PCP - General (Family Medicine) Rolm Bookbinder, MD as Consulting Physician (Dermatology)  Indicate any recent Medical Services you may have received from other than [facility] providers in the past year (date may be approximate).     Assessment:   This is a routine wellness examination for Tyrina.  Hearing/Vision screen Hearing Screening - Comments:: Wears hearing aids Vision Screening - Comments:: Wears reading glasses - up to date with routine eye exams with  Dr Satira Sark  Dietary issues and exercise activities discussed: Exercise limited by: None identified   Goals Addressed               This Visit's Progress     Stay Healthy (pt-stated)         Depression Screen    07/10/2022   11:45 AM 10/07/2021    2:35 PM  07/08/2021   11:16 AM 04/18/2021   11:10 AM 03/06/2020    3:13 PM 04/11/2019   12:11 PM 04/05/2018   11:48 AM  PHQ 2/9 Scores  PHQ - 2 Score 0 0 0  0 0 0  PHQ- 9 Score  0   0    Exception Documentation    Other- indicate reason in comment box       Fall Risk    07/10/2022   11:47 AM 05/24/2022    4:12 PM 01/16/2022    9:35 AM 10/07/2021    2:35 PM 07/08/2021   11:21 AM  [place] in the past year? 0 0 0 0 0  Number falls in past yr: 0  0 0 0  Injury with Fall? 0  0 0 0  Risk for fall due to : No Fall Risks   No Fall Risks No Fall Risks  Follow up Falls prevention discussed   Falls evaluation completed     FALL RISK PREVENTION PERTAINING TO THE HOME:  Any stairs in or around the home? Yes  If so, are there any without handrails? No  Home free of loose throw rugs in walkways, pet beds, electrical cords, etc? Yes  Adequate lighting in your home to reduce risk of falls? Yes   ASSISTIVE DEVICES UTILIZED TO PREVENT FALLS:  Life alert? Yes  Use of a cane, walker or w/c? Yes  Grab bars in the bathroom? Yes  Shower chair or bench in shower? Yes  Elevated toilet seat or a handicapped toilet? Yes   TIMED UP AND GO:  Was the test performed? No . Audio Visit  Cognitive Function:        07/10/2022   11:49 AM 07/08/2021   11:23 AM  6CIT Screen  What Year? 0 points 0 points  What month? 0 points 0 points  What time? 0 points 0 points  Count back from 20 0 points 2 points  Months in reverse 0 points 0 points  Repeat phrase 0 points 0 points  Total Score 0 points 2 points    Immunizations Immunization History  Administered Date(s) Administered   Influenza Split 02/13/2013   Influenza Whole 01/31/2008   Influenza, High Dose Seasonal PF 01/31/2016, 02/07/2017, 02/16/2018, 01/21/2019   Influenza-Unspecified 01/09/2014, 01/24/2017, 02/09/2018, 01/22/2020, 02/08/2021, 02/02/2022   PFIZER(Purple Top)SARS-COV-2 Vaccination 06/15/2019, 08/07/2019, 04/11/2020, 04/19/2021    PNEUMOCOCCAL CONJUGATE-20 04/28/2021   Pneumococcal Polysaccharide-23 05/11/1996   Respiratory Syncytial Virus Vaccine,Recomb Aduvanted(Arexvy) 05/25/2022   Td 05/11/1994   Tdap 10/30/2013   Zoster Recombinat (Shingrix) 03/22/2021, 07/02/2021   Zoster, Live 11/04/2010    TDAP status: Up to date  Flu Vaccine status: Up to date  Pneumococcal vaccine status: Up to date  Covid-19 vaccine status: Completed vaccines  Qualifies for Shingles Vaccine? Yes   Zostavax completed Yes   Shingrix Completed?: Yes  Screening Tests Health Maintenance  Topic Date Due   COVID-19 Vaccine (5 - 2023-24 season) 07/26/2022 (Originally 01/09/2022)   Medicare Annual Wellness (AWV)  07/10/2023   DTaP/Tdap/Td (3 - Td or Tdap) 10/31/2023   Pneumonia Vaccine 51+ Years old  Completed   INFLUENZA VACCINE  Completed   DEXA SCAN  Completed   Zoster Vaccines- Shingrix  Completed   HPV VACCINES  Aged Out    Health Maintenance  There are no preventive care reminders to display for this patient.   Colorectal cancer screening: No longer required.   Mammogram status: No longer required due to Age.  Bone Density status: Completed 05/13/10. Results reflect: Bone density results: OSTEOPOROSIS. Repeat every   years.  Lung Cancer Screening: (Low Dose CT Chest recommended if Age 43-80 years, 30 pack-year currently smoking OR have quit w/in 15years.) does not qualify.     Additional Screening:  Hepatitis C Screening: does not qualify; Completed    Vision Screening: Recommended annual ophthalmology exams for early detection of glaucoma and other disorders of the eye. Is the patient up to date with their annual eye exam?  Yes  Who is the provider or what is the name of the office in which the patient attends annual eye exams? Dr Satira Sark If pt is not established with a provider, would they like to be referred to a provider to establish care? No .   Dental Screening: Recommended annual dental exams for proper oral  hygiene  Community Resource Referral / Chronic Care Management:  CRR required this visit?  No   CCM required this visit?  No      Plan:     I have personally reviewed and noted the following in the patient's chart:   Medical and social history Use of  alcohol, tobacco or illicit drugs  Current medications and supplements including opioid prescriptions. Patient is not currently taking opioid prescriptions. Functional ability and status Nutritional status Physical activity Advanced directives List of other physicians Hospitalizations, surgeries, and ER visits in previous 12 months Vitals Screenings to include cognitive, depression, and falls Referrals and appointments  In addition, I have reviewed and discussed with patient certain preventive protocols, quality metrics, and best practice recommendations. A written personalized care plan for preventive services as well as general preventive health recommendations were provided to patient.     [name], LPN   [id number]   Nurse Notes: None

## 2022-07-14 DIAGNOSIS — M9905 Segmental and somatic dysfunction of pelvic region: Secondary | ICD-10-CM | POA: Diagnosis not present

## 2022-07-14 DIAGNOSIS — M9903 Segmental and somatic dysfunction of lumbar region: Secondary | ICD-10-CM | POA: Diagnosis not present

## 2022-07-14 DIAGNOSIS — M9904 Segmental and somatic dysfunction of sacral region: Secondary | ICD-10-CM | POA: Diagnosis not present

## 2022-07-14 DIAGNOSIS — M5136 Other intervertebral disc degeneration, lumbar region: Secondary | ICD-10-CM | POA: Diagnosis not present

## 2022-07-20 ENCOUNTER — Telehealth: Payer: Self-pay | Admitting: Family Medicine

## 2022-07-20 ENCOUNTER — Ambulatory Visit: Payer: Medicare PPO | Admitting: Neurology

## 2022-07-20 NOTE — Telephone Encounter (Signed)
FYI pt is calling to let md know she cancel her appt and is having a billing issue that is under review she has been charged 40.00 instead of 20 dollar copay due to they have dr Legrand Como listed as specialists

## 2022-07-20 NOTE — Telephone Encounter (Signed)
Spoke with the patient and informed her our Engineer, building services sent this information to the department to check on this as PCP should not be listed as a specialist.

## 2022-07-21 ENCOUNTER — Ambulatory Visit: Payer: Medicare PPO | Admitting: Family Medicine

## 2022-07-29 ENCOUNTER — Telehealth: Payer: Self-pay | Admitting: Family Medicine

## 2022-07-29 NOTE — Telephone Encounter (Signed)
LVM (detailed) to let pt know that I was able to speak to billing and they are going to send her information off to their INS department for them to review her acct. It will take up to 2 wks and for pt to give billing a call back after the two wks to check on the status of her claim.   FYI

## 2022-08-04 DIAGNOSIS — M9903 Segmental and somatic dysfunction of lumbar region: Secondary | ICD-10-CM | POA: Diagnosis not present

## 2022-08-04 DIAGNOSIS — M9904 Segmental and somatic dysfunction of sacral region: Secondary | ICD-10-CM | POA: Diagnosis not present

## 2022-08-04 DIAGNOSIS — M5136 Other intervertebral disc degeneration, lumbar region: Secondary | ICD-10-CM | POA: Diagnosis not present

## 2022-08-04 DIAGNOSIS — M9905 Segmental and somatic dysfunction of pelvic region: Secondary | ICD-10-CM | POA: Diagnosis not present

## 2022-08-21 DIAGNOSIS — Z85828 Personal history of other malignant neoplasm of skin: Secondary | ICD-10-CM | POA: Diagnosis not present

## 2022-08-21 DIAGNOSIS — D485 Neoplasm of uncertain behavior of skin: Secondary | ICD-10-CM | POA: Diagnosis not present

## 2022-08-21 DIAGNOSIS — L820 Inflamed seborrheic keratosis: Secondary | ICD-10-CM | POA: Diagnosis not present

## 2022-08-21 DIAGNOSIS — L821 Other seborrheic keratosis: Secondary | ICD-10-CM | POA: Diagnosis not present

## 2022-08-21 DIAGNOSIS — L980 Pyogenic granuloma: Secondary | ICD-10-CM | POA: Diagnosis not present

## 2022-08-25 DIAGNOSIS — M9905 Segmental and somatic dysfunction of pelvic region: Secondary | ICD-10-CM | POA: Diagnosis not present

## 2022-08-25 DIAGNOSIS — M9904 Segmental and somatic dysfunction of sacral region: Secondary | ICD-10-CM | POA: Diagnosis not present

## 2022-08-25 DIAGNOSIS — M9903 Segmental and somatic dysfunction of lumbar region: Secondary | ICD-10-CM | POA: Diagnosis not present

## 2022-08-25 DIAGNOSIS — M5136 Other intervertebral disc degeneration, lumbar region: Secondary | ICD-10-CM | POA: Diagnosis not present

## 2022-09-01 DIAGNOSIS — Z131 Encounter for screening for diabetes mellitus: Secondary | ICD-10-CM | POA: Diagnosis not present

## 2022-09-01 DIAGNOSIS — Z0130 Encounter for examination of blood pressure without abnormal findings: Secondary | ICD-10-CM | POA: Diagnosis not present

## 2022-09-01 DIAGNOSIS — E559 Vitamin D deficiency, unspecified: Secondary | ICD-10-CM | POA: Diagnosis not present

## 2022-09-01 DIAGNOSIS — E7800 Pure hypercholesterolemia, unspecified: Secondary | ICD-10-CM | POA: Diagnosis not present

## 2022-09-01 DIAGNOSIS — J449 Chronic obstructive pulmonary disease, unspecified: Secondary | ICD-10-CM | POA: Diagnosis not present

## 2022-09-01 DIAGNOSIS — Z79899 Other long term (current) drug therapy: Secondary | ICD-10-CM | POA: Diagnosis not present

## 2022-09-01 DIAGNOSIS — R9431 Abnormal electrocardiogram [ECG] [EKG]: Secondary | ICD-10-CM | POA: Diagnosis not present

## 2022-09-01 DIAGNOSIS — R300 Dysuria: Secondary | ICD-10-CM | POA: Diagnosis not present

## 2022-09-01 DIAGNOSIS — R0602 Shortness of breath: Secondary | ICD-10-CM | POA: Diagnosis not present

## 2022-09-01 DIAGNOSIS — R5383 Other fatigue: Secondary | ICD-10-CM | POA: Diagnosis not present

## 2022-09-01 DIAGNOSIS — M129 Arthropathy, unspecified: Secondary | ICD-10-CM | POA: Diagnosis not present

## 2022-09-01 DIAGNOSIS — D539 Nutritional anemia, unspecified: Secondary | ICD-10-CM | POA: Diagnosis not present

## 2022-09-01 DIAGNOSIS — Z6823 Body mass index (BMI) 23.0-23.9, adult: Secondary | ICD-10-CM | POA: Diagnosis not present

## 2022-09-01 LAB — LAB REPORT - SCANNED
A1c: 5.3
EGFR (Non-African Amer.): 51

## 2022-09-04 DIAGNOSIS — D509 Iron deficiency anemia, unspecified: Secondary | ICD-10-CM | POA: Diagnosis not present

## 2022-09-04 DIAGNOSIS — J449 Chronic obstructive pulmonary disease, unspecified: Secondary | ICD-10-CM | POA: Diagnosis not present

## 2022-09-04 DIAGNOSIS — Z6823 Body mass index (BMI) 23.0-23.9, adult: Secondary | ICD-10-CM | POA: Diagnosis not present

## 2022-09-04 DIAGNOSIS — R0602 Shortness of breath: Secondary | ICD-10-CM | POA: Diagnosis not present

## 2022-09-04 DIAGNOSIS — R5383 Other fatigue: Secondary | ICD-10-CM | POA: Diagnosis not present

## 2022-09-04 DIAGNOSIS — Z0130 Encounter for examination of blood pressure without abnormal findings: Secondary | ICD-10-CM | POA: Diagnosis not present

## 2022-09-04 DIAGNOSIS — R7989 Other specified abnormal findings of blood chemistry: Secondary | ICD-10-CM | POA: Diagnosis not present

## 2022-09-07 ENCOUNTER — Telehealth: Payer: Self-pay | Admitting: Family Medicine

## 2022-09-07 NOTE — Telephone Encounter (Signed)
Patient unsure as to whether she needs to come in for a follow up

## 2022-09-07 NOTE — Telephone Encounter (Signed)
I left a detailed message at the patient's home number per the last office note, PCP advised she follow up in March.  Advised the patient call to schedule a follow up appt soon as she is overdue.

## 2022-09-08 DIAGNOSIS — Z0130 Encounter for examination of blood pressure without abnormal findings: Secondary | ICD-10-CM | POA: Diagnosis not present

## 2022-09-08 DIAGNOSIS — R600 Localized edema: Secondary | ICD-10-CM | POA: Diagnosis not present

## 2022-09-08 DIAGNOSIS — Z6823 Body mass index (BMI) 23.0-23.9, adult: Secondary | ICD-10-CM | POA: Diagnosis not present

## 2022-09-08 DIAGNOSIS — R0602 Shortness of breath: Secondary | ICD-10-CM | POA: Diagnosis not present

## 2022-09-08 DIAGNOSIS — R5383 Other fatigue: Secondary | ICD-10-CM | POA: Diagnosis not present

## 2022-09-08 DIAGNOSIS — D509 Iron deficiency anemia, unspecified: Secondary | ICD-10-CM | POA: Diagnosis not present

## 2022-09-08 DIAGNOSIS — J449 Chronic obstructive pulmonary disease, unspecified: Secondary | ICD-10-CM | POA: Diagnosis not present

## 2022-09-08 DIAGNOSIS — R7989 Other specified abnormal findings of blood chemistry: Secondary | ICD-10-CM | POA: Diagnosis not present

## 2022-09-11 DIAGNOSIS — I493 Ventricular premature depolarization: Secondary | ICD-10-CM | POA: Diagnosis not present

## 2022-09-11 DIAGNOSIS — R9431 Abnormal electrocardiogram [ECG] [EKG]: Secondary | ICD-10-CM | POA: Diagnosis not present

## 2022-09-11 DIAGNOSIS — I4891 Unspecified atrial fibrillation: Secondary | ICD-10-CM | POA: Diagnosis not present

## 2022-09-11 DIAGNOSIS — R600 Localized edema: Secondary | ICD-10-CM | POA: Diagnosis not present

## 2022-09-11 DIAGNOSIS — R0602 Shortness of breath: Secondary | ICD-10-CM | POA: Diagnosis not present

## 2022-09-14 ENCOUNTER — Ambulatory Visit: Payer: Medicare PPO | Admitting: Family Medicine

## 2022-09-18 DIAGNOSIS — R002 Palpitations: Secondary | ICD-10-CM | POA: Diagnosis not present

## 2022-09-25 ENCOUNTER — Ambulatory Visit: Payer: Medicare PPO | Admitting: Internal Medicine

## 2022-09-25 ENCOUNTER — Encounter: Payer: Self-pay | Admitting: Internal Medicine

## 2022-09-25 VITALS — BP 116/64 | HR 74 | Ht 61.0 in | Wt 106.2 lb

## 2022-09-25 DIAGNOSIS — I4891 Unspecified atrial fibrillation: Secondary | ICD-10-CM | POA: Diagnosis not present

## 2022-09-25 DIAGNOSIS — I480 Paroxysmal atrial fibrillation: Secondary | ICD-10-CM

## 2022-09-25 DIAGNOSIS — J301 Allergic rhinitis due to pollen: Secondary | ICD-10-CM

## 2022-09-25 DIAGNOSIS — R7989 Other specified abnormal findings of blood chemistry: Secondary | ICD-10-CM | POA: Diagnosis not present

## 2022-09-25 NOTE — Progress Notes (Signed)
Heidi Moore    [id number]    [birth year]  Primary Care Physician:Michael, Vinetta Bergamo, MD  Referring Physician: Karie Georges, MD [address] [place],  [place] [address] Reason for Consultation: copd Date of Consultation: 09/25/2022  Chief complaint:   Chief Complaint  Patient presents with   Consult    Pt states she is unsure why she is here for today's appt. Pt denies any complaints of SOB, no coughing, no chest tightness, or wheezing.     HPI: Heidi Moore is a 89 y.o. woman with past medical history of allergic rhinitis and conjunctivitis who presents for new patient evaluation of copd.  She saw her primary care doctor at [facility] and had dyspnea with lower extremity edema in December 2023. Was sent to a heart doctor. Started on torsemide. Recently(May 3rd) started on digoxin for a fib. She had an EKG and heart monitor. (I am unable to review these records.) This was at [facility].   She thinks this is making her have blurry vision and dizziness. She also has macular degeneration so wonder if it could be related to this. She wasn't able to tolerate eye drops.   She has lifelong allergies and has taken anti-histamine  I see a referral that Chrys Racer DNP sent to [place] for copd. She is a lifelong never smoker and denies dyspnea, coughing, chest tightness or wheezing. She denies recurrent pneumonia or bronchitis.   She stays active and goes to the gym with her son and does the treadmill and the stationary bike and rowing machine. She lives independently. She keeps up with her ADLs.   Smoking history: never smoker, no passive smoke exposure.   Social History   Occupational History    Employer: RETIRED  Tobacco Use   Smoking status: Never    Passive exposure: Never   Smokeless tobacco: Never  Vaping Use   Vaping Use: Never used  Substance and Sexual Activity   Alcohol use: No   Drug use: No   Sexual activity: Not  Currently    Birth control/protection: Surgical    Relevant family history:  Family History  Problem Relation Age of Onset   Stroke Mother 110   Lung cancer Sister    Diabetes Brother    Atrial fibrillation Brother        coumadin   Colon cancer Other        1st Cousin    Allergic rhinitis Son    Allergic rhinitis Daughter     Past Medical History:  Diagnosis Date   AC (acromioclavicular) joint bone spurs    on right shoulder    Allergic rhinitis    Arthritis    Bursitis    right shoulder   Complication of anesthesia    headaches   Diverticulosis    DIVERTICULOSIS, COLON 10/15/2006   Qualifier: Diagnosis of  By: Alwyn Ren MD, Deborra Medina problem    treated by Dr Alben Spittle per patient   [id number](784.6)    sinus HA   History of blood transfusion 59yrs ago   no abnormal reaction noted   History of cystitis    History of skin cancer    Basal cell, Dr. Leta Speller   Hyperlipidemia    [place] Study LDL goal =<160    Hypotension    Joint pain    Joint swelling    Macular degeneration, dry    Osteopenia  Intolerance to Calcium,Actonel,Fosamax,Evista, Forteo: S/P Boniva 2007 to 03/2010   OSTEOPENIA 03/12/2008   Qualifier: Diagnosis of  By: Freddy Jaksch     Osteoporosis    PONV (postoperative nausea and vomiting)    Rosacea 04/22/2009   Qualifier: Diagnosis of  By: Alwyn Ren MD, Margret Chance cancer 06/22/2017   per patient after excision right calf by Dr Nicholas Lose   SKIN CANCER, HX OF 03/12/2008   Qualifier: Diagnosis of  By: Freddy Jaksch     Urinary frequency    Vaginal atrophy    sees Dr. Huntley Dec    Past Surgical History:  Procedure Laterality Date   APPENDECTOMY     BUNIONECTOMY Bilateral    CATARACT EXTRACTION, BILATERAL     COLONOSCOPY     with Tics (no further f/u as per GI due to age)    JOINT REPLACEMENT Left    knee    KNEE ARTHROPLASTY  05/18/2012   Procedure: COMPUTER ASSISTED TOTAL KNEE ARTHROPLASTY;  Surgeon: Eldred Manges, MD;  Location: [facility]  OR;  Service: Orthopedics;  Laterality: Left;  Left Total Knee Arthroplasty, Cemented, Computer Assisted   lt knee      meniscus tear   MOHS SURGERY     right nostril for basel cell cancer    NOSE SURGERY     due to trama at age 75    TOTAL ABDOMINAL HYSTERECTOMY W/ BILATERAL SALPINGOOPHORECTOMY     for Endometriosis    TOTAL SHOULDER ARTHROPLASTY Right 09/11/2013   Procedure: TOTAL SHOULDER ARTHROPLASTY;  Surgeon: Eldred Manges, MD;  Location: [facility] OR;  Service: Orthopedics;  Laterality: Right;  Right Total Shoulder Arthroplasty     Physical Exam: Blood pressure 116/64, pulse 74, height 5\' 1"  (1.549 m), weight 106 lb 3.2 oz (48.2 kg), SpO2 99 %. Gen:      No acute distress ENT:  mucus membranes moist Lungs:    No increased respiratory effort, symmetric chest wall excursion, clear to auscultation bilaterally, no wheezes or crackles CV:         irregularly irregular, mild pedal edema R>L, chronic venous stasis Abd:      + bowel sounds; soft, non-tender; no distension MSK: no acute synovitis of DIP or PIP joints, no mechanics hands.  Skin:      Warm and dry; no rashes Neuro: normal speech, no focal facial asymmetry Psych: alert and oriented x3, normal mood and affect   Data Reviewed/Medical Decision Making:  Independent interpretation of tests: Imaging:  PFTs:   Labs: Lab Results  Component Value Date   WBC 6.3 10/31/2021   HGB 13.5 10/31/2021   HCT 41.0 10/31/2021   MCV 91.7 10/31/2021   PLT 179.0 10/31/2021   Lab Results  Component Value Date   NA 142 10/31/2021   K 4.3 10/31/2021   CL 105 10/31/2021   CO2 30 10/31/2021    Immunization status:  Immunization History  Administered Date(s) Administered   Influenza Split 02/13/2013   Influenza Whole 01/31/2008   Influenza, High Dose Seasonal PF 01/31/2016, 02/07/2017, 02/16/2018, 01/21/2019   Influenza-Unspecified 01/09/2014, 01/24/2017, 02/09/2018, 01/22/2020, 02/08/2021, 02/02/2022   PFIZER(Purple Top)SARS-COV-2  Vaccination 06/15/2019, 08/07/2019, 04/11/2020, 04/19/2021   PNEUMOCOCCAL CONJUGATE-20 04/28/2021   Pneumococcal Polysaccharide-23 05/11/1996   Respiratory Syncytial Virus Vaccine,Recomb Aduvanted(Arexvy) 05/25/2022   Td 05/11/1994   Tdap 10/30/2013   Zoster Recombinat (Shingrix) 03/22/2021, 07/02/2021   Zoster, Live 11/04/2010     I reviewed prior external note(s) from primary care, allergy  I reviewed the  result(s) of the labs and imaging as noted above.   I have ordered    Assessment:  Chronic rhinitis secondary to environmental allergies Atrial fibrillation on digoxin Chronic heart failure unknown type on torsemide  Plan/Recommendations:  Unclear why she was referred for COPD. She is a lifelong never smoker and no evidence of COPD.  Can continue her treatments for the allergies as she is doing.  Recommend follow up with her cardiologist and limiting health care access with one primary care physician. Sounds like she is seeing Chrys Racer a nurse practitioner at [facility].   I am happy to see her back on an as needed basis.   Return to Care: Return if symptoms worsen or fail to improve.  Durel Salts, MD Pulmonary and Critical Care Medicine [facility] Office:[phone]  CC: Karie Georges, MD

## 2022-09-25 NOTE — Patient Instructions (Addendum)
Please follow up with me as needed. Follow up with the heart doctors about your atrial fibrillation (irregular heart beat.) You do not have any evidence of lung disease.

## 2022-09-28 DIAGNOSIS — R002 Palpitations: Secondary | ICD-10-CM | POA: Diagnosis not present

## 2022-09-28 DIAGNOSIS — R5383 Other fatigue: Secondary | ICD-10-CM | POA: Diagnosis not present

## 2022-09-28 DIAGNOSIS — Z0130 Encounter for examination of blood pressure without abnormal findings: Secondary | ICD-10-CM | POA: Diagnosis not present

## 2022-09-28 DIAGNOSIS — D509 Iron deficiency anemia, unspecified: Secondary | ICD-10-CM | POA: Diagnosis not present

## 2022-09-28 DIAGNOSIS — R600 Localized edema: Secondary | ICD-10-CM | POA: Diagnosis not present

## 2022-09-28 DIAGNOSIS — Z6823 Body mass index (BMI) 23.0-23.9, adult: Secondary | ICD-10-CM | POA: Diagnosis not present

## 2022-09-28 DIAGNOSIS — I4891 Unspecified atrial fibrillation: Secondary | ICD-10-CM | POA: Diagnosis not present

## 2022-10-01 ENCOUNTER — Ambulatory Visit: Payer: Medicare PPO | Admitting: Family Medicine

## 2022-10-01 ENCOUNTER — Institutional Professional Consult (permissible substitution): Payer: Medicare PPO | Admitting: Internal Medicine

## 2022-10-02 DIAGNOSIS — I4891 Unspecified atrial fibrillation: Secondary | ICD-10-CM | POA: Diagnosis not present

## 2022-10-02 DIAGNOSIS — R7989 Other specified abnormal findings of blood chemistry: Secondary | ICD-10-CM | POA: Diagnosis not present

## 2022-10-02 DIAGNOSIS — I493 Ventricular premature depolarization: Secondary | ICD-10-CM | POA: Diagnosis not present

## 2022-10-02 DIAGNOSIS — R0602 Shortness of breath: Secondary | ICD-10-CM | POA: Diagnosis not present

## 2022-10-05 DIAGNOSIS — R748 Abnormal levels of other serum enzymes: Secondary | ICD-10-CM | POA: Diagnosis not present

## 2022-10-05 DIAGNOSIS — I493 Ventricular premature depolarization: Secondary | ICD-10-CM | POA: Diagnosis not present

## 2022-10-05 DIAGNOSIS — I4891 Unspecified atrial fibrillation: Secondary | ICD-10-CM | POA: Diagnosis not present

## 2022-10-05 DIAGNOSIS — R600 Localized edema: Secondary | ICD-10-CM | POA: Diagnosis not present

## 2022-10-05 DIAGNOSIS — R3989 Other symptoms and signs involving the genitourinary system: Secondary | ICD-10-CM | POA: Diagnosis not present

## 2022-10-05 DIAGNOSIS — R7989 Other specified abnormal findings of blood chemistry: Secondary | ICD-10-CM | POA: Diagnosis not present

## 2022-10-05 DIAGNOSIS — R300 Dysuria: Secondary | ICD-10-CM | POA: Diagnosis not present

## 2022-10-05 DIAGNOSIS — R0602 Shortness of breath: Secondary | ICD-10-CM | POA: Diagnosis not present

## 2022-10-12 DIAGNOSIS — R600 Localized edema: Secondary | ICD-10-CM | POA: Diagnosis not present

## 2022-10-12 DIAGNOSIS — I4891 Unspecified atrial fibrillation: Secondary | ICD-10-CM | POA: Diagnosis not present

## 2022-10-12 DIAGNOSIS — M7121 Synovial cyst of popliteal space [Baker], right knee: Secondary | ICD-10-CM | POA: Diagnosis not present

## 2022-10-13 DIAGNOSIS — H401133 Primary open-angle glaucoma, bilateral, severe stage: Secondary | ICD-10-CM | POA: Diagnosis not present

## 2022-10-13 DIAGNOSIS — H5203 Hypermetropia, bilateral: Secondary | ICD-10-CM | POA: Diagnosis not present

## 2022-10-13 DIAGNOSIS — H353133 Nonexudative age-related macular degeneration, bilateral, advanced atrophic without subfoveal involvement: Secondary | ICD-10-CM | POA: Diagnosis not present

## 2022-10-13 DIAGNOSIS — H04123 Dry eye syndrome of bilateral lacrimal glands: Secondary | ICD-10-CM | POA: Diagnosis not present

## 2022-10-26 DIAGNOSIS — M9904 Segmental and somatic dysfunction of sacral region: Secondary | ICD-10-CM | POA: Diagnosis not present

## 2022-10-26 DIAGNOSIS — M9905 Segmental and somatic dysfunction of pelvic region: Secondary | ICD-10-CM | POA: Diagnosis not present

## 2022-10-26 DIAGNOSIS — M9903 Segmental and somatic dysfunction of lumbar region: Secondary | ICD-10-CM | POA: Diagnosis not present

## 2022-10-26 DIAGNOSIS — M5136 Other intervertebral disc degeneration, lumbar region: Secondary | ICD-10-CM | POA: Diagnosis not present

## 2022-10-27 ENCOUNTER — Encounter (HOSPITAL_BASED_OUTPATIENT_CLINIC_OR_DEPARTMENT_OTHER): Payer: Self-pay | Admitting: Student

## 2022-10-27 ENCOUNTER — Ambulatory Visit (HOSPITAL_BASED_OUTPATIENT_CLINIC_OR_DEPARTMENT_OTHER): Payer: Medicare PPO | Admitting: Student

## 2022-10-27 ENCOUNTER — Ambulatory Visit (HOSPITAL_BASED_OUTPATIENT_CLINIC_OR_DEPARTMENT_OTHER): Payer: Medicare PPO | Admitting: Radiology

## 2022-10-27 ENCOUNTER — Ambulatory Visit (HOSPITAL_BASED_OUTPATIENT_CLINIC_OR_DEPARTMENT_OTHER): Payer: Medicare PPO

## 2022-10-27 DIAGNOSIS — G8929 Other chronic pain: Secondary | ICD-10-CM | POA: Diagnosis not present

## 2022-10-27 DIAGNOSIS — M25561 Pain in right knee: Secondary | ICD-10-CM

## 2022-10-27 DIAGNOSIS — M7121 Synovial cyst of popliteal space [Baker], right knee: Secondary | ICD-10-CM

## 2022-10-27 NOTE — Progress Notes (Signed)
Chief Complaint: Right knee pain and swelling     History of Present Illness:    Heidi Moore is a 89 y.o. female presenting to clinic today for evaluation of pain and swelling in the right knee she has been having since April.  She has noticed some swelling forming in the back of the knee.  She did have 1 instance about 2 weeks ago where she felt a catching/popping sensation toward the back of the knee.  She has been going to the gym twice a week utilizing a treadmill and stationary bike for exercise.  Does also report general swelling in her lower legs.  She is followed by a cardiologist who she is seeing this Friday, that had her on torsemide 5 mg up until a few weeks ago when she was instructed to come off.  Did have her left knee replaced 10 years ago by Dr. Ophelia Charter.   Surgical History:   Left TKA 2014  PMH/PSH/Family History/Social History/Meds/Allergies:    Past Medical History:  Diagnosis Date   AC (acromioclavicular) joint bone spurs    on right shoulder    Allergic rhinitis    Arthritis    Bursitis    right shoulder   Complication of anesthesia    headaches   Diverticulosis    DIVERTICULOSIS, COLON 10/15/2006   Qualifier: Diagnosis of  By: Alwyn Ren MD, Deborra Medina problem    treated by Dr Alben Spittle per patient   [id number](478.2)    sinus HA   History of blood transfusion 15yrs ago   no abnormal reaction noted   History of cystitis    History of skin cancer    Basal cell, Dr. Leta Speller   Hyperlipidemia    F[place] Study LDL goal =<160    Hypotension    Joint pain    Joint swelling    Macular degeneration, dry    Osteopenia    Intolerance to Calcium,Actonel,Fosamax,Evista, Forteo: S/P Boniva 2007 to 03/2010   OSTEOPENIA 03/12/2008   Qualifier: Diagnosis of  By: Freddy Jaksch     Osteoporosis    PONV (postoperative nausea and vomiting)    Rosacea 04/22/2009   Qualifier: Diagnosis of  By: Alwyn Ren MD, William     Skin  cancer 06/22/2017   per patient after excision right calf by Dr Nicholas Lose   SKIN CANCER, HX OF 03/12/2008   Qualifier: Diagnosis of  By: Freddy Jaksch     Urinary frequency    Vaginal atrophy    sees Dr. Huntley Dec   Past Surgical History:  Procedure Laterality Date   APPENDECTOMY     BUNIONECTOMY Bilateral    CATARACT EXTRACTION, BILATERAL     COLONOSCOPY     with Tics (no further f/u as per GI due to age)    JOINT REPLACEMENT Left    knee    KNEE ARTHROPLASTY  05/18/2012   Procedure: COMPUTER ASSISTED TOTAL KNEE ARTHROPLASTY;  Surgeon: Eldred Manges, MD;  Location: [facility] OR;  Service: Orthopedics;  Laterality: Left;  Left Total Knee Arthroplasty, Cemented, Computer Assisted   lt knee      meniscus tear   MOHS SURGERY     right nostril for basel cell cancer    NOSE SURGERY     due to trama at age 50  TOTAL ABDOMINAL HYSTERECTOMY W/ BILATERAL SALPINGOOPHORECTOMY     for Endometriosis    TOTAL SHOULDER ARTHROPLASTY Right 09/11/2013   Procedure: TOTAL SHOULDER ARTHROPLASTY;  Surgeon: Eldred Manges, MD;  Location: [facility] OR;  Service: Orthopedics;  Laterality: Right;  Right Total Shoulder Arthroplasty   Social History   Socioeconomic History   Marital status: Widowed    Spouse name: Not on file   Number of children: 3   Years of education: Not on file   Highest education level: Some college, no degree  Occupational History    Employer: RETIRED  Tobacco Use   Smoking status: Never    Passive exposure: Never   Smokeless tobacco: Never  Vaping Use   Vaping Use: Never used  Substance and Sexual Activity   Alcohol use: No   Drug use: No   Sexual activity: Not Currently    Birth control/protection: Surgical  Other Topics Concern   Not on file  Social History Narrative   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 She lives alone. She goes to the gym and participates in aerobics 3 x week. She drinks 3 cups of coffee a day.         Social Determinants of Health   Financial Resource Strain: Low Risk  (07/10/2022)   Overall Financial Resource Strain (CARDIA)    Difficulty of Paying Living Expenses: Not hard at all  Food Insecurity: No Food Insecurity (07/10/2022)   Hunger Vital Sign    Worried About Running Out of Food in the Last Year: Never true    Ran Out of Food in the Last Year: Never true  Transportation Needs: No Transportation Needs (07/10/2022)   PRAPARE - Administrator, Civil Service (Medical): No    Lack of Transportation (Non-Medical): No  Recent Concern: Transportation Needs - Unmet Transportation Needs (05/24/2022)   PRAPARE - Administrator, Civil Service (Medical): Yes    Lack of Transportation (Non-Medical): No  Physical Activity: Insufficiently Active (07/10/2022)   Exercise Vital Sign    Days of Exercise per Week: 2 days    Minutes of Exercise per Session: 60 min  Stress: No Stress Concern Present (07/10/2022)   [employer] of Occupational Health - Occupational Stress Questionnaire    Feeling of Stress : Not at all  Recent Concern: Stress - Stress Concern Present (05/24/2022)   [place] [employer] of Occupational Health - Occupational Stress Questionnaire    Feeling of Stress : Very much  Social Connections: Moderately Integrated (07/10/2022)   Social Connection and Isolation Panel [NHANES]    Frequency of Communication with Friends and Family: More than three times a week    Frequency of Social Gatherings with Friends and Family: More than three times a week    Attends Religious Services: More than 4 times per year     Active Member of [employer] or Organizations: Yes    Attends Banker Meetings: More than 4 times per year  Marital Status: Widowed  Recent Concern: Social Connections - Moderately Isolated (05/24/2022)   Social Connection and Isolation Panel [NHANES]    Frequency of Communication with Friends and Family: More than three times a week    Frequency of Social Gatherings with Friends and Family: More than three times a week    Attends Religious Services: Never    Database administrator or Organizations: Yes    Attends Banker Meetings: 1 to 4 times per year    Marital Status: Widowed   Family History  Problem Relation Age of Onset   Stroke Mother 35   Lung cancer Sister    Diabetes Brother    Atrial fibrillation Brother        coumadin   Colon cancer Other        1st Cousin    Allergic rhinitis Son    Allergic rhinitis Daughter    Allergies  Allergen Reactions   Alendronate Sodium Nausea And Vomiting   Anaplex Hd Nausea And Vomiting   Aspirin Nausea And Vomiting   Azithromycin Nausea And Vomiting   Calcium Nausea And Vomiting   Ciprofloxacin     Patient states this caused nausea, dizziness and affected her muscles   Codeine Nausea And Vomiting   Dorzolamide Hcl-Timolol Mal Other (See Comments)   Doxycycline Nausea And Vomiting   Gabapentin Other (See Comments)    Dizziness, Joint pain, blurred vision, changes in eyesight   Hydrocodone Nausea And Vomiting   Ibandronate Sodium     REACTION: severe body aches   Ibuprofen Nausea And Vomiting   Penicillins Nausea And Vomiting   Raloxifene Nausea And Vomiting   Risedronate Sodium Nausea And Vomiting   Sulfonamide Derivatives Nausea And Vomiting   Teriparatide Nausea And Vomiting   Travoprost    Current Outpatient Medications  Medication Sig Dispense Refill   acetaminophen (TYLENOL) 325 MG tablet Take 650 mg by mouth every 6 (six) hours as needed for mild pain or headache.     acetic acid-hydrocortisone  (VOSOL-HC) OTIC solution Place 3 drops into both ears 2 (two) times daily. 10 mL 2   Ascorbic Acid (VITAMIN C PO) Take 500 mg by mouth 2 (two) times daily.     B Complex-C (B-COMPLEX WITH VITAMIN C) tablet Take 1 tablet by mouth daily.     cholecalciferol (VITAMIN D) 1000 UNITS tablet Take 1,000 Units by mouth 2 (two) times daily.      Digoxin 62.5 MCG TABS Take 1 tablet by mouth daily.     estradiol (ESTRACE) 0.5 MG tablet Take 0.5 mg by mouth daily.     fexofenadine (ALLEGRA ALLERGY CHILDRENS) 30 MG/5ML suspension 1/4 tsp daily 118 mL 5   OVER THE COUNTER MEDICATION 2 (two) times daily. Preservision vitamin for eyes     torsemide (DEMADEX) 5 MG tablet Take 5 mg by mouth daily.     triamcinolone (NASACORT) 55 MCG/ACT AERO nasal inhaler Place 1 spray into the nose daily. 1 each 5   VITAMIN A PO Take by mouth daily.     vitamin E 400 UNIT capsule Take 1 capsule by mouth daily.     No current facility-administered medications for this visit.   No results found.  Review of Systems:   A ROS was performed including pertinent positives and negatives as documented in the HPI.  Physical Exam :   Constitutional: NAD and appears stated age Neurological: Alert and oriented Psych: Appropriate affect and cooperative There were no vitals taken for  this visit.   Comprehensive Musculoskeletal Exam:    There is a palpable Baker's cyst in the posterior right knee.  No significant medial or lateral joint line tenderness.  Mild crepitus with range of motion.  Active range of motion 0 to 120 degrees.  Mild pitting edema of both lower extremities.  Imaging:   Xray (right knee 4 views): Moderate joint space narrowing without any significant osteophytes suggesting mild to moderate osteoarthritis   I personally reviewed and interpreted the radiographs.   Assessment:   89 y.o. female with right knee pain and bilateral lower leg edema.  She does have a palpable Baker's cyst in the right knee which she  is aware of, however at this point I do not believe it is causing her significant problems.  At this point I recommend monitoring this and if it becomes significantly bothersome I can refer her to have it aspirated under ultrasound guidance, however I do not believe that is necessary at this time.  She does see her cardiologist later this week during which time I recommended discussing her lower leg edema and if she needs to restart on a diuretic.  Recommend continuing with compression and topical NSAID as needed.  She also has a follow-up with Dr. Ophelia Charter in a few weeks so I will defer to him for follow-up unless patient has any other concerns in the meantime.  Plan :    -Continue monitoring Baker's cyst and RICE therapy for symptom relief -Follow-up with Dr. Ophelia Charter on 11/10/2022     I personally saw and evaluated the patient, and participated in the management and treatment plan.  Hazle Nordmann, [name] Orthopedics  This document was dictated using Conservation officer, historic buildings. A reasonable attempt at proof reading has been made to minimize errors.

## 2022-10-30 DIAGNOSIS — R600 Localized edema: Secondary | ICD-10-CM | POA: Diagnosis not present

## 2022-10-30 DIAGNOSIS — I4891 Unspecified atrial fibrillation: Secondary | ICD-10-CM | POA: Diagnosis not present

## 2022-11-04 DIAGNOSIS — J309 Allergic rhinitis, unspecified: Secondary | ICD-10-CM | POA: Diagnosis not present

## 2022-11-04 DIAGNOSIS — G629 Polyneuropathy, unspecified: Secondary | ICD-10-CM | POA: Diagnosis not present

## 2022-11-04 DIAGNOSIS — E785 Hyperlipidemia, unspecified: Secondary | ICD-10-CM | POA: Diagnosis not present

## 2022-11-04 DIAGNOSIS — R609 Edema, unspecified: Secondary | ICD-10-CM | POA: Diagnosis not present

## 2022-11-04 DIAGNOSIS — M1990 Unspecified osteoarthritis, unspecified site: Secondary | ICD-10-CM | POA: Diagnosis not present

## 2022-11-04 DIAGNOSIS — I10 Essential (primary) hypertension: Secondary | ICD-10-CM | POA: Diagnosis not present

## 2022-11-04 DIAGNOSIS — H3530 Unspecified macular degeneration: Secondary | ICD-10-CM | POA: Diagnosis not present

## 2022-11-04 DIAGNOSIS — R32 Unspecified urinary incontinence: Secondary | ICD-10-CM | POA: Diagnosis not present

## 2022-11-04 DIAGNOSIS — M8580 Other specified disorders of bone density and structure, unspecified site: Secondary | ICD-10-CM | POA: Diagnosis not present

## 2022-11-09 ENCOUNTER — Encounter: Payer: Self-pay | Admitting: Family Medicine

## 2022-11-09 ENCOUNTER — Ambulatory Visit: Payer: Medicare PPO | Admitting: Family Medicine

## 2022-11-09 VITALS — BP 100/60 | HR 47 | Temp 98.1°F | Ht 61.0 in | Wt 102.8 lb

## 2022-11-09 DIAGNOSIS — H6122 Impacted cerumen, left ear: Secondary | ICD-10-CM

## 2022-11-09 DIAGNOSIS — R600 Localized edema: Secondary | ICD-10-CM

## 2022-11-09 DIAGNOSIS — R7989 Other specified abnormal findings of blood chemistry: Secondary | ICD-10-CM

## 2022-11-09 DIAGNOSIS — I4891 Unspecified atrial fibrillation: Secondary | ICD-10-CM

## 2022-11-09 NOTE — Patient Instructions (Addendum)
A and D ointment for dryness  OK to stay off the torsemide-- only take it when the swelling is very bad

## 2022-11-09 NOTE — Assessment & Plan Note (Addendum)
New, recently diagnosed at urgent care on 09/01/22, is seeing Dr. [name] Riding. I have asked for records. She is currently on digoxin 0.625 mg daily for HR control. States that she stopped the torsemide due to dizziness and feeling dehydrated. I will ask for the records from Dr. [name] Riding, I recommend we continue the digoxin as prescribed. I advised that it is ok for her to stay off the torsemide due to dehydration.

## 2022-11-09 NOTE — Assessment & Plan Note (Signed)
Chronic, most likely secondary to chronic venous stasis from varicose veins, however d dimer will help [place] rule out DVT. Will order D dimer. Encouraged her to continue wearing the compression stockings.

## 2022-11-09 NOTE — Progress Notes (Signed)
Established Patient Office Visit  Subjective   Patient ID: Heidi Moore, female    DOB: [birth year]  Age: 89 y.o. MRN: [id number]  Chief Complaint  Patient presents with   Cerumen Impaction   Ear Pain    Patient complains of left ear pain and congestion since last night    Pt is here for follow up today. States she went to the urgent care on 09/01/22 due to lower extremity edema. She had a full work up and I reviewed her bloodwork. States she was referred to a cardiologist and lung doctor, states that they told her her heart rate was abnormal. She was diagnosed with atrial fibrillation. She was placed on digoxin for heart rate control and also given torsemide 5 mg. States that they two together made her dizzy and blurry vision, so she split them up. States she stopped taking the torsemide about 4 days ago because it was causing her to feel dehydrated. States that she was seeing Dr. Rosita Kea, last saw him on the 21st. Is not sure if he performed an ECHO of her heart. She had many questions about her medications and we spent 30 minutes answering her questions, reviewing her labs and the note from the pulmonologist.  I reviewed the notes and the labs done on 09/01/22 at [facility] medical. Labs looked stable from previous labs she had done.    Pt is complaining of ear pain, states that she wears hearing aids regularly, is concerned about wax and infection.     Current Outpatient Medications  Medication Instructions   acetaminophen (TYLENOL) 650 mg, Oral, Every 6 hours PRN   acetic acid-hydrocortisone (VOSOL-HC) OTIC solution 3 drops, Both EARS, 2 times daily   Ascorbic Acid (VITAMIN C PO) 500 mg, Oral, 2 times daily   B Complex-C (B-COMPLEX WITH VITAMIN C) tablet 1 tablet, Daily   cholecalciferol (VITAMIN D) 1,000 Units, Oral, 2 times daily   Digoxin 62.5 MCG TABS 1 tablet, Oral, Daily   estradiol (ESTRACE) 0.5 mg, Oral, Daily   fexofenadine (ALLEGRA ALLERGY CHILDRENS) 30 MG/5ML  suspension 1/4 tsp daily   OVER THE COUNTER MEDICATION 2 times daily, Preservision vitamin for eyes    torsemide (DEMADEX) 5 mg, Oral, Daily   triamcinolone (NASACORT) 55 MCG/ACT AERO nasal inhaler 1 spray, Nasal, Daily   VITAMIN A PO Oral, Daily   vitamin E 400 UNIT capsule 1 capsule, Oral, Daily    Patient Active Problem List   Diagnosis Date Noted   Atrial fibrillation (HCC) 11/09/2022   Chronic eczematous otitis externa of both ears 05/25/2022   Anxiety 05/25/2022   Dizziness 12/12/2021   Dysuria 12/04/2021   Idiopathic polyneuropathy 12/04/2021   Hypothyroidism 12/04/2021   Optic atrophy of both eyes 12/10/2020   Intermediate stage nonexudative age-related macular degeneration of both eyes 05/21/2020   Primary open angle glaucoma of both eyes, severe stage 05/21/2020   Scarring, chorioretinal, 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 Georgianne Fick 08/24/2012   LOW BACK PAIN SYNDROME 05/07/2010   Vitamin D deficiency 04/22/2009   Allergic rhinitis 03/12/2008   Hyperlipemia 10/22/2006      Review of Systems  All other systems reviewed and are negative.     Objective:     BP 100/60 (BP Location: Left Arm, Patient Position: Sitting, Cuff Size: Normal)   Pulse (!) 47  Temp 98.1 F (36.7 C) (Oral)   Ht 5\' 1"  (1.549 m)   Wt 102 lb 12.8 oz (46.6 kg)   SpO2 97%   BMI 19.42 kg/m    Physical Exam Vitals reviewed.  Constitutional:      Appearance: Normal appearance. She is well-groomed and normal weight.  HENT:     Right Ear: Tympanic membrane normal.     Left Ear: Tympanic membrane normal.  Eyes:     Conjunctiva/sclera: Conjunctivae normal.  Cardiovascular:     Rate and Rhythm: Normal rate. Rhythm irregular.     Pulses: Normal pulses.     Heart sounds: S1 normal and S2 normal. No murmur heard. Pulmonary:     Effort:  Pulmonary effort is normal.     Breath sounds: Normal breath sounds and air entry.  Musculoskeletal:     Right lower leg: Edema (1 _ pitting in the ankles BL) present.     Left lower leg: Edema present.  Neurological:     Mental Status: She is alert and oriented to person, place, and time. Mental status is at baseline.     Gait: Gait is intact.  Psychiatric:        Mood and Affect: Mood and affect normal.        Speech: Speech normal.        Behavior: Behavior normal.        Judgment: Judgment normal.    Left Ear cerumen impaction removal note:  After verbal consent was obtained the left ear was irrigated with a mixture of hydrogen peroxide and warm water. Patient tolerated the procedure well.    Left TM was visualized after the procedure was complete and the TM is normal in appearance, no erythema or signs of infection was seen,   No results found for any visits on 11/09/22.    The ASCVD Risk score (Arnett DK, et al., 2019) failed to calculate for the following reasons:   The 2019 ASCVD risk score is only valid for ages 60 to 57    Assessment & Plan:  Atrial fibrillation, unspecified type [facility]) Assessment & Plan: New, recently diagnosed at urgent care on 09/01/22, is seeing Dr. [name] Riding. I have asked for records. She is currently on digoxin 0.625 mg daily for HR control. States that she stopped the torsemide due to dizziness and feeling dehydrated. I will ask for the records from Dr. [name] Riding, I recommend we continue the digoxin as prescribed. I advised that it is ok for her to stay off the torsemide due to dehydration.   Lower extremity edema Assessment & Plan: Chronic, most likely secondary to chronic venous stasis from varicose veins, however d dimer will help [place] rule out DVT. Will order D dimer. Encouraged her to continue wearing the compression stockings.  Orders: -     D-dimer, quantitative  Hearing loss of left ear due to cerumen impaction   S/p ear irrigation in the  office today which was successful in removing the excess wax in the left ear. RTC PRN.   Return in about 6 months (around 05/12/2023).    Karie Georges, MD

## 2022-11-10 ENCOUNTER — Ambulatory Visit: Payer: Medicare PPO | Admitting: Orthopaedic Surgery

## 2022-11-10 ENCOUNTER — Encounter: Payer: Self-pay | Admitting: Orthopaedic Surgery

## 2022-11-10 VITALS — BP 108/66 | HR 71 | Ht 61.0 in | Wt 102.0 lb

## 2022-11-10 DIAGNOSIS — M659 Synovitis and tenosynovitis, unspecified: Secondary | ICD-10-CM | POA: Diagnosis not present

## 2022-11-10 DIAGNOSIS — M25561 Pain in right knee: Secondary | ICD-10-CM | POA: Diagnosis not present

## 2022-11-10 DIAGNOSIS — M65961 Unspecified synovitis and tenosynovitis, right lower leg: Secondary | ICD-10-CM | POA: Insufficient documentation

## 2022-11-10 LAB — D-DIMER, QUANTITATIVE: D-Dimer, Quant: 1.1 mcg/mL FEU — ABNORMAL HIGH (ref <0.50)

## 2022-11-10 MED ORDER — METHYLPREDNISOLONE ACETATE 40 MG/ML IJ SUSP
40.0000 mg | INTRAMUSCULAR | Status: AC | PRN
Start: 2022-11-10 — End: 2022-11-10
  Administered 2022-11-10: 40 mg via INTRA_ARTICULAR

## 2022-11-10 MED ORDER — BUPIVACAINE HCL 0.25 % IJ SOLN
4.0000 mL | INTRAMUSCULAR | Status: AC | PRN
Start: 2022-11-10 — End: 2022-11-10
  Administered 2022-11-10: 4 mL via INTRA_ARTICULAR

## 2022-11-10 MED ORDER — LIDOCAINE HCL 1 % IJ SOLN
0.5000 mL | INTRAMUSCULAR | Status: AC | PRN
Start: 2022-11-10 — End: 2022-11-10
  Administered 2022-11-10: .5 mL

## 2022-11-10 NOTE — Addendum Note (Signed)
Addended by: Karie Georges on: 11/10/2022 07:59 AM   Modules accepted: Orders

## 2022-11-10 NOTE — Progress Notes (Signed)
Office Visit Note   Patient: Heidi Moore           Date of Birth: 01/15/1920           MRN: [id number] Visit Date: 11/10/2022              Requested by: Karie Georges, MD [address] [place],  [place] [address] PCP: Karie Georges, MD   Assessment & Plan: Visit Diagnoses:  1. Synovitis of right knee     Plan: Patient has right knee synovitis with some mild osteoarthritic changes on x-ray.  She has a Doppler coming up from some mild lower extremity swelling with positive D-dimer.  Intra-articular injection performed right knee for mild osteoarthritis with synovitis.  She will let me know if she is having ongoing problems.  We discussed that if her Doppler test is positive for DVT she may have to be back on a blood thinner.  Follow-Up Instructions: No follow-ups on file.   Orders:  Orders Placed This Encounter  Procedures   Large Joint Inj   No orders of the defined types were placed in this encounter.     Procedures: Large Joint Inj: R knee on 11/10/2022 8:18 PM Indications: pain and joint swelling Details: 22 G 1.5 in needle, anterolateral approach  Arthrogram: No  Medications: 40 mg methylPREDNISolone acetate 40 MG/ML; 0.5 mL lidocaine 1 %; 4 mL bupivacaine 0.25 % Outcome: tolerated well, no immediate complications Procedure, treatment alternatives, risks and benefits explained, specific risks discussed. Consent was given by the patient. Immediately prior to procedure a time out was called to verify the correct patient, procedure, equipment, support staff and site/side marked as required. Patient was prepped and draped in the usual sterile fashion.       Clinical Data: No additional findings.   Subjective: Chief Complaint  Patient presents with   Left Knee - Pain    HPI 89 year old female who I did left total knee arthroplasty at age 73 with left knee doing well.  Still rides a bike walks daily but has had increased problems with the right  knee with discomfort and intermittent pain.  She has had to use a cane some.  She was concerned she might have a Baker's cyst.  She has had slight swelling of both lower extremities but never had any venous stasis problems no cellulitis.  She does have some medium Ted compression's that she can wear if she like.  Review of Systems all other systems noncontributory.  She has had some atrial fibrillation in the past and recently was started on some digoxin.  She takes vitamin D also calcium vitamin D.   Objective: Vital Signs: BP 108/66   Pulse 71   Ht 5\' 1"  (1.549 m)   Wt 102 lb (46.3 kg)   BMI 19.27 kg/m   Physical Exam Constitutional:      Appearance: She is well-developed.  HENT:     Head: Normocephalic.     Right Ear: External ear normal.     Left Ear: External ear normal. There is no impacted cerumen.  Eyes:     Pupils: Pupils are equal, round, and reactive to light.  Neck:     Thyroid: No thyromegaly.     Trachea: No tracheal deviation.  Cardiovascular:     Rate and Rhythm: Normal rate.  Pulmonary:     Effort: Pulmonary effort is normal.  Abdominal:     Palpations: Abdomen is soft.  Musculoskeletal:  Cervical back: No rigidity.  Skin:    General: Skin is warm and dry.  Neurological:     Mental Status: She is alert and oriented to person, place, and time.  Psychiatric:        Behavior: Behavior normal.     Ortho Exam mild tenderness right knee more medial than lateral.  No varus valgus deformity negative logroll hips.  Opposite left knee comes to full extension flexes just to 90 degrees.  Trace lower extremity edema no venous stasis changes no skin lesions.  Specialty Comments:  No specialty comments available.  Imaging: No results found.   PMFS History: Patient Active Problem List   Diagnosis Date Noted   Synovitis of right knee 11/10/2022   Atrial fibrillation (HCC) 11/09/2022   Chronic eczematous otitis externa of both ears 05/25/2022   Anxiety  05/25/2022   Dizziness 12/12/2021   Dysuria 12/04/2021   Idiopathic polyneuropathy 12/04/2021   Hypothyroidism 12/04/2021   Optic atrophy of both eyes 12/10/2020   Intermediate stage nonexudative age-related macular degeneration of both eyes 05/21/2020   Primary open angle glaucoma of both eyes, severe stage 05/21/2020   Scarring, chorioretinal, 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 Georgianne Fick 08/24/2012   LOW BACK PAIN SYNDROME 05/07/2010   Vitamin D deficiency 04/22/2009   Allergic rhinitis 03/12/2008   Hyperlipemia 10/22/2006   Past Medical History:  Diagnosis Date   AC (acromioclavicular) joint bone spurs    on right shoulder    Allergic rhinitis    Arthritis    Bursitis    right shoulder   Complication of anesthesia    headaches   Diverticulosis    DIVERTICULOSIS, COLON 10/15/2006   Qualifier: Diagnosis of  By: Alwyn Ren MD, Deborra Medina problem    treated by Dr Alben Spittle per patient   ZOXWRUEA(540.9)    sinus HA   History of blood transfusion 65yrs ago   no abnormal reaction noted   History of cystitis    History of skin cancer    Basal cell, Dr. Leta Speller   Hyperlipidemia    [place] Study LDL goal =<160    Hypotension    Joint pain    Joint swelling    Macular degeneration, dry    Osteopenia    Intolerance to Calcium,Actonel,Fosamax,Evista, Forteo: S/P Boniva 2007 to 03/2010   OSTEOPENIA 03/12/2008   Qualifier: Diagnosis of  By: Freddy Jaksch     Osteoporosis    PONV (postoperative nausea and vomiting)    Rosacea 04/22/2009   Qualifier: Diagnosis of  By: Alwyn Ren MD, William     Skin cancer 06/22/2017   per patient after excision right calf by Dr Nicholas Lose   SKIN CANCER, HX OF 03/12/2008   Qualifier: Diagnosis of  By: Freddy Jaksch     Urinary frequency    Vaginal atrophy    sees Dr. Huntley Dec    Family  History  Problem Relation Age of Onset   Stroke Mother 37   Lung cancer Sister    Diabetes Brother    Atrial fibrillation Brother        coumadin   Colon cancer Other        1st Cousin    Allergic rhinitis Son    Allergic rhinitis Daughter     Past Surgical History:  Procedure Laterality Date  APPENDECTOMY     BUNIONECTOMY Bilateral    CATARACT EXTRACTION, BILATERAL     COLONOSCOPY     with Tics (no further f/u as per GI due to age)    JOINT REPLACEMENT Left    knee    KNEE ARTHROPLASTY  05/18/2012   Procedure: COMPUTER ASSISTED TOTAL KNEE ARTHROPLASTY;  Surgeon: Eldred Manges, MD;  Location: [facility] OR;  Service: Orthopedics;  Laterality: Left;  Left Total Knee Arthroplasty, Cemented, Computer Assisted   lt knee      meniscus tear   MOHS SURGERY     right nostril for basel cell cancer    NOSE SURGERY     due to trama at age 68    TOTAL ABDOMINAL HYSTERECTOMY W/ BILATERAL SALPINGOOPHORECTOMY     for Endometriosis    TOTAL SHOULDER ARTHROPLASTY Right 09/11/2013   Procedure: TOTAL SHOULDER ARTHROPLASTY;  Surgeon: Eldred Manges, MD;  Location: [facility] OR;  Service: Orthopedics;  Laterality: Right;  Right Total Shoulder Arthroplasty   Social History   Occupational History    Employer: RETIRED  Tobacco Use   Smoking status: Never    Passive exposure: Never   Smokeless tobacco: Never  Vaping Use   Vaping Use: Never used  Substance and Sexual Activity   Alcohol use: No   Drug use: No   Sexual activity: Not Currently    Birth control/protection: Surgical

## 2022-11-11 ENCOUNTER — Ambulatory Visit (HOSPITAL_COMMUNITY)
Admission: RE | Admit: 2022-11-11 | Discharge: 2022-11-11 | Disposition: A | Discharge status: Home / Self Care | Payer: Medicare PPO | Source: Ambulatory Visit | Attending: Vascular Surgery | Admitting: Vascular Surgery

## 2022-11-11 DIAGNOSIS — R7989 Other specified abnormal findings of blood chemistry: Secondary | ICD-10-CM | POA: Diagnosis not present

## 2022-11-11 DIAGNOSIS — R600 Localized edema: Secondary | ICD-10-CM | POA: Insufficient documentation

## 2022-11-16 NOTE — Progress Notes (Signed)
No DVT in the legs

## 2022-12-10 ENCOUNTER — Telehealth: Payer: Self-pay | Admitting: Cardiology

## 2022-12-10 NOTE — Telephone Encounter (Signed)
Spoke with the patient who states that she saw Dr. [name] Riding at [facility] and was started on digoxin back in May 2024. She states that she was told that she had a irregular heart rhythm. She states that she was having episodes of dizziness. She stopped the medication for two weeks and symptoms did not improve so she restarted it. She states that symptoms have not improved. She also reports swelling in her legs along with tingling/burning sensation. This has been ongoing and she has been seeing a vascular specialist. She also complains of blurry vision which has also been ongoing. She states that Dr. [name] Riding is leaving and will no longer be seeing patients so she needs to see a new cardiologist. She is currently scheduled for an appointment with Francis Dowse on 8/30. Patient wants to know if she can be seen sooner. Moved appointment to 8/2.

## 2022-12-10 NOTE — Telephone Encounter (Signed)
Pt c/o medication issue:  1. Name of Medication:   Digoxin 62.5 MCG TABS   2. How are you currently taking this medication (dosage and times per day)?   3. Are you having a reaction (difficulty breathing--STAT)?  Dizzy, legs get tired and numb feeling, headache  4. What is your medication issue?   Patient is concerned that this medication is making her dizzy.

## 2022-12-11 ENCOUNTER — Ambulatory Visit: Payer: Medicare PPO | Attending: Physician Assistant | Admitting: Physician Assistant

## 2022-12-11 ENCOUNTER — Encounter: Payer: Self-pay | Admitting: Physician Assistant

## 2022-12-11 ENCOUNTER — Ambulatory Visit (INDEPENDENT_AMBULATORY_CARE_PROVIDER_SITE_OTHER): Payer: Medicare PPO

## 2022-12-11 VITALS — BP 100/62 | HR 93 | Ht 61.0 in | Wt 108.4 lb

## 2022-12-11 DIAGNOSIS — I4891 Unspecified atrial fibrillation: Secondary | ICD-10-CM | POA: Diagnosis not present

## 2022-12-11 DIAGNOSIS — R600 Localized edema: Secondary | ICD-10-CM | POA: Diagnosis not present

## 2022-12-11 DIAGNOSIS — Z79899 Other long term (current) drug therapy: Secondary | ICD-10-CM | POA: Diagnosis not present

## 2022-12-11 MED ORDER — APIXABAN 2.5 MG PO TABS
2.5000 mg | ORAL_TABLET | Freq: Two times a day (BID) | ORAL | 6 refills | Status: DC
Start: 2022-12-11 — End: 2022-12-18

## 2022-12-11 MED ORDER — APIXABAN 2.5 MG PO TABS: 2.5000 mg | ORAL_TABLET | Freq: Two times a day (BID) | ORAL | 0 refills | Status: DC

## 2022-12-11 NOTE — Patient Instructions (Signed)
Medication Instructions:   STOP TAKING TORSEMIDE NOW  STOP TAKING DIGOXIN NOW  START TAKING ELIQUIS 2.5 MG BY MOUTH TWICE DAILY  *If you need a refill on your cardiac medications before your next appointment, please call your pharmacy*   You have been referred to [facility] FOR SWELLING IN YOUR LOWER EXTREMITIES   Lab Work:  TODAY--BMET AND CBC   If you have labs (blood work) drawn today and your tests are completely normal, you will receive your results only by: MyChart Message (if you have MyChart) OR A paper copy in the mail If you have any lab test that is abnormal or we need to change your treatment, we will call you to review the results.   Testing/Procedures:  Your physician has requested that you have an echocardiogram. Echocardiography is a painless test that uses sound waves to create images of your heart. It provides your doctor with information about the size and shape of your heart and how well your heart's chambers and valves are working. This procedure takes approximately one hour. There are no restrictions for this procedure. Please do NOT wear cologne, perfume, aftershave, or lotions (deodorant is allowed). Please arrive 15 minutes prior to your appointment time.    ZIO XT- Long Term Monitor Instructions  Your physician has requested you wear a ZIO patch monitor for 3 days.  This is a single patch monitor. [employer] supplies one patch monitor per enrollment. Additional stickers are not available. Please do not apply patch if you will be having a Nuclear Stress Test,  Echocardiogram, Cardiac CT, MRI, or Chest Xray during the period you would be wearing the  monitor. The patch cannot be worn during these tests. You cannot remove and re-apply the  ZIO XT patch monitor.  Your ZIO patch monitor will be mailed 3 day USPS to your address on file. It may take 3-5 days  to receive your monitor after you have been enrolled.  Once you  have received your monitor, please review the enclosed instructions. Your monitor  has already been registered assigning a specific monitor serial # to you.  Billing and Patient Assistance Program Information  We have supplied [employer] with any of your insurance information on file for billing purposes. [employer] offers a sliding scale Patient Assistance Program for patients that do not have  insurance, or whose insurance does not completely cover the cost of the ZIO monitor.  You must apply for the Patient Assistance Program to qualify for this discounted rate.  To apply, please call [employer] at [phone], select option 4, select option 2, ask to apply for  Patient Assistance Program. Meredeth Ide will ask your household income, and how many people  are in your household. They will quote your out-of-pocket cost based on that information.  [employer] will also be able to set up a 33-month, interest-free payment plan if needed.  Applying the monitor   Shave hair from upper left chest.  Hold abrader disc by orange tab. Rub abrader in 40 strokes over the upper left chest as  indicated in your monitor instructions.  Clean area with 4 enclosed alcohol pads. Let dry.  Apply patch as indicated in monitor instructions. Patch will be placed under collarbone on left  side of chest with arrow pointing upward.  Rub patch adhesive wings for 2 minutes. Remove white label marked "1". Remove the white  label marked "2". Rub patch adhesive wings for 2 additional minutes.  While looking in  a mirror, press and release button in center of patch. A small green light will  flash 3-4 times. This will be your only indicator that the monitor has been turned on.  Do not shower for the first 24 hours. You may shower after the first 24 hours.  Press the button if you feel a symptom. You will hear a small click. Record Date, Time and  Symptom in the Patient Logbook.  When you are ready to remove the patch, follow  instructions on the last 2 pages of Patient  Logbook. Stick patch monitor onto the last page of Patient Logbook.  Place Patient Logbook in the blue and white box. Use locking tab on box and tape box closed  securely. The blue and white box has prepaid postage on it. Please place it in the mailbox as  soon as possible. Your physician should have your test results approximately 7 days after the  monitor has been mailed back to [facility].  Call [facility] Customer Care at [phone] if you have questions regarding  your ZIO XT patch monitor. Call them immediately if you see an orange light blinking on your  monitor.  If your monitor falls off in less than 4 days, contact our Monitor department at [phone].  If your monitor becomes loose or falls off after 4 days call [employer] at [phone] for  suggestions on securing your monitor    Follow-Up:  2 MONTHS WITH DR. Lalla Brothers ONLY IN THE OFFICE

## 2022-12-11 NOTE — Progress Notes (Unsigned)
Enrolled patient for a 3 day Zio XT monitor to be mailed to patients home   Lalla Brothers to read

## 2022-12-11 NOTE — Progress Notes (Signed)
Cardiology Office Note Date:  12/11/2022  Patient ID:  Heidi Moore, Heidi Moore 06/09/1919, MRN [id number] PCP:  Karie Georges, MD  Electrophysiologist: Dr. Lalla Brothers (2021)    Chief Complaint:  edema, dizziness  History of Present Illness: Heidi Moore is a 89 y.o. female with history of HLD, LE edema  She 1st and last saw Dr. Lalla Brothers Sept 2021, reported known/established hx of LE that was improved with support stockings, increased through the day reported prior vascular evaluation, unclear specifics, also mentioned some flushing symptoms again, specifics, pattern behavior was unclear. Exam was c/w venous insufficiency recommended compression stockings during the day.  Did not think cardiac w/u would be of any benefit, advised against OTC supplementations.  She saw her PMD 11/09/22, discussed [facility] evaluation back in April for edema, referred to cardiologist and apparently dx with AFib tx w/dig and torsemide.  Pt self stopped torsemide feeling dehydrated recently. Discussed seeing Dr. [name] Riding (?cardiologist) unclear w/u or if echo was done Planned to get her record for [place]/Dr. [name] Riding  TODAY She is accompanied by her daughter The patient at 89 y/o, lives independently, goes to the gym 2x/week, uses the leg press, rowing machine and treadmill, and reports that she really feels well, has great exertional capacity, able to do her ADLs and the gym without any abrupt or notable changes in her capacity.  She reports in April her son noted that she has swollen legs, no symptoms, denies then, since then or over feeling SOB, no CP, palpitations or cardiac awareness. She was seen at a [facility] apparently and found to have an irregular rhythm and CXR and referred to pulmonary and cardiology. She apparently was found with AFib Started on digoxin and torsemide.  Though sometimes at night feels like can hear her heart beat she has noted dizziness, and perhaps a change in her vision that she  attributes to the medications  She describes the dizziness as a subtle constant sense of feeling off balance, not weak, lightheaded, no near syncope or syncope. She has known macular degeneration though he vision change is more a sense of her far field being blurred. No double vision or field cuts outside of her baseline degeneration  She or her PMD stopped the torsemide July 1st without change or improvement in the symptoms She self held the digoxin for the last 2 days also without change or improvement in her symptoms  She denies any bleeding/heme history but does bruise easily    Past Medical History:  Diagnosis Date   AC (acromioclavicular) joint bone spurs    on right shoulder    Allergic rhinitis    Arthritis    Bursitis    right shoulder   Complication of anesthesia    headaches   Diverticulosis    DIVERTICULOSIS, COLON 10/15/2006   Qualifier: Diagnosis of  By: Alwyn Ren MD, Deborra Medina problem    treated by Dr Alben Spittle per patient   WGNFAOZH(086.5)    sinus HA   History of blood transfusion 16yrs ago   no abnormal reaction noted   History of cystitis    History of skin cancer    Basal cell, Dr. Leta Speller   Hyperlipidemia    [place] Study LDL goal =<160    Hypotension    Joint pain    Joint swelling    Macular degeneration, dry    Osteopenia    Intolerance to Calcium,Actonel,Fosamax,Evista, Forteo: S/P Boniva 2007 to 03/2010  OSTEOPENIA 03/12/2008   Qualifier: Diagnosis of  By: Freddy Jaksch     Osteoporosis    PONV (postoperative nausea and vomiting)    Rosacea 04/22/2009   Qualifier: Diagnosis of  By: Alwyn Ren MD, Margret Chance cancer 06/22/2017   per patient after excision right calf by Dr Nicholas Lose   SKIN CANCER, HX OF 03/12/2008   Qualifier: Diagnosis of  By: Freddy Jaksch     Urinary frequency    Vaginal atrophy    sees Dr. Huntley Dec    Past Surgical History:  Procedure Laterality Date   APPENDECTOMY     BUNIONECTOMY Bilateral    CATARACT  EXTRACTION, BILATERAL     COLONOSCOPY     with Tics (no further f/u as per GI due to age)    JOINT REPLACEMENT Left    knee    KNEE ARTHROPLASTY  05/18/2012   Procedure: COMPUTER ASSISTED TOTAL KNEE ARTHROPLASTY;  Surgeon: Eldred Manges, MD;  Location: [facility] OR;  Service: Orthopedics;  Laterality: Left;  Left Total Knee Arthroplasty, Cemented, Computer Assisted   lt knee      meniscus tear   MOHS SURGERY     right nostril for basel cell cancer    NOSE SURGERY     due to trama at age 38    TOTAL ABDOMINAL HYSTERECTOMY W/ BILATERAL SALPINGOOPHORECTOMY     for Endometriosis    TOTAL SHOULDER ARTHROPLASTY Right 09/11/2013   Procedure: TOTAL SHOULDER ARTHROPLASTY;  Surgeon: Eldred Manges, MD;  Location: [facility] OR;  Service: Orthopedics;  Laterality: Right;  Right Total Shoulder Arthroplasty    Current Outpatient Medications  Medication Sig Dispense Refill   acetaminophen (TYLENOL) 325 MG tablet Take 650 mg by mouth every 6 (six) hours as needed for mild pain or headache.     acetic acid-hydrocortisone (VOSOL-HC) OTIC solution Place 3 drops into both ears 2 (two) times daily. 10 mL 2   Ascorbic Acid (VITAMIN C PO) Take 500 mg by mouth 2 (two) times daily.     B Complex-C (B-COMPLEX WITH VITAMIN C) tablet Take 1 tablet by mouth daily.     cholecalciferol (VITAMIN D) 1000 UNITS tablet Take 1,000 Units by mouth 2 (two) times daily.      Digoxin 62.5 MCG TABS Take 1 tablet by mouth daily.     estradiol (ESTRACE) 0.5 MG tablet Take 0.5 mg by mouth daily.     fexofenadine (ALLEGRA ALLERGY CHILDRENS) 30 MG/5ML suspension 1/4 tsp daily 118 mL 5   OVER THE COUNTER MEDICATION 2 (two) times daily. Preservision vitamin for eyes     torsemide (DEMADEX) 5 MG tablet Take 5 mg by mouth daily.     triamcinolone (NASACORT) 55 MCG/ACT AERO nasal inhaler Place 1 spray into the nose daily. 1 each 5   VITAMIN A PO Take by mouth daily.     vitamin E 400 UNIT capsule Take 1 capsule by mouth daily.     No current  facility-administered medications for this visit.    Allergies:   Alendronate sodium, Anaplex hd, Aspirin, Azithromycin, Calcium, Ciprofloxacin, Codeine, Dorzolamide hcl-timolol mal, Doxycycline, Gabapentin, Hydrocodone, Ibandronate sodium, Ibuprofen, Penicillins, Raloxifene, Risedronate sodium, Sulfonamide derivatives, Teriparatide, and Travoprost   Social History:  The patient  reports that she has never smoked. She has never been exposed to tobacco smoke. She has never used smokeless tobacco. She reports that she does not drink alcohol and does not use drugs.   Family History:  The patient's family history includes  Allergic rhinitis in her daughter and son; Atrial fibrillation in her brother; Colon cancer in an other family member; Diabetes in her brother; Lung cancer in her sister; Stroke (age of onset: 37) in her mother.  ROS:  Please see the history of present illness.    All other systems are reviewed and otherwise negative.   PHYSICAL EXAM:  VS:  There were no vitals taken for this visit. BMI: There is no height or weight on file to calculate BMI. Well nourished, well developed, in no acute distress HEENT: normocephalic, atraumatic Neck: no JVD, carotid bruits or masses Cardiac:  irreg-irreg; no significant murmurs, no rubs, or gallops Lungs:  CTA b/l, no wheezing, rhonchi or rales Abd: soft, nontender MS: no deformity or atrophy Ext: 1-2+ edema to mid chin, slight erythema, more chrinic skin changes b/l LE edema Skin: warm and dry, no rash Neuro:  No gross deficits appreciated Psych: euthymic mood, full affect   EKG:  Done today and reviewed by myself shows  AFib 93bpm   11/11/22: LE venous US Summary:  RIGHT:  - No evidence of deep vein thrombosis in the lower extremity. No indirect  evidence of obstruction proximal to the inguinal ligament.  - There is no evidence of superficial venous thrombosis.    - Right popliteal fossa complex cystic structure measuring  approximately  4.6 x 1.0 x 2.8cm. sonographic findings are consistent with Baker's cyst.    LEFT:  - No evidence of deep vein thrombosis in the lower extremity. No indirect  evidence of obstruction proximal to the inguinal ligament.  - There is no evidence of superficial venous thrombosis.   Recent Labs: 03/10/2022: TSH 3.69  No results found for requested labs within last 365 days.   CrCl cannot be calculated (Patient's most recent lab result is older than the maximum 21 days allowed.).   Wt Readings from Last 3 Encounters:  11/10/22 102 lb (46.3 kg)  11/09/22 102 lb 12.8 oz (46.6 kg)  09/25/22 106 lb 3.2 oz (48.2 kg)     Other studies reviewed: Additional studies/records reviewed today include: summarized above  ASSESSMENT AND PLAN:  LE edema Seems to go back for her for years though she doesn't seem to think it has been problematic by her recollection/assessment until April this year No SOB No DOE, exertional intolerances No CP No nocturnal symptoms  Stop/stay off torsemide Echo [facility] referral for their leg edema clinic    2.   AFib CHA2DS2Vasc is 3 (including gender) Rate controlled today Start Eliquis 2.5mg  BID We discussed Afib and risk of stroke, discussed risks and benefit of OAC Discussed signs/symptoms of bleeding  Labs today Stop/stay off digoxin 3 day monitor to assess rates/rhythm  Cares was discussed and plan formulated with DOD     Disposition: F/u with [place] again in 2 mo, sooner if needed    Current medicines are reviewed at length with the patient today.  The patient did not have any concerns regarding medicines.  Norma Fredrickson, [name] 12/11/2022 7:07 AM     [facility] [address] [place] [address] [phone] (office)  [phone] (fax)

## 2022-12-14 DIAGNOSIS — I4891 Unspecified atrial fibrillation: Secondary | ICD-10-CM

## 2022-12-15 ENCOUNTER — Telehealth: Payer: Self-pay | Admitting: Physician Assistant

## 2022-12-15 NOTE — Telephone Encounter (Signed)
Spoke with the patient who states that she has had an increase in burning/itching all over her body since starting on Eliquis. Patient report same symptoms in her legs prior to being on the Eliquis. She states that it has gotten worse though.  She also reports swelling in her legs. This has been ongoing. She is not having any shortness of breath and has not gained any weight. She was referred to see [facility] and has an appointment coming up. She states that she is still having dizziness as well. Will make Renee aware for any recommendations.

## 2022-12-15 NOTE — Telephone Encounter (Signed)
Pt c/o medication issue:  1. Name of Medication: apixaban (ELIQUIS) 2.5 MG TABS tablet   2. How are you currently taking this medication (dosage and times per day)? As prescribed  3. Are you having a reaction (difficulty breathing--STAT)?   4. What is your medication issue? Pt states she believes, that she is reacting to the blood thinner that Renee put her on. She says she gets dizzy, has burning in legs, back, her vagina, and intestines. She says she has itching down her arms, face and neck. She would like to know if she needs to stay on it since she might be having a reaction to it. She says she would like a c/b and to talk with Renee.    Pt c/o swelling: STAT is pt has developed SOB within 24 hours  How much weight have you gained and in what time span?  Pt thinks she is gaining some since being off the fluid pill  If swelling, where is the swelling located? Knee down, but sometimes it's above the knee. At first in the morning, its above the ankle. Then as the day goes on, it moves up her leg  Are you currently taking a fluid pill? No  Are you currently SOB? No  Do you have a log of your daily weights (if so, list)? No, doesn't have a scale  Have you gained 3 pounds in a day or 5 pounds in a week?   Have you traveled recently? No  Pt states she goes to the gym 2x a week, she stats this helps with the swelling. She also wears compression socks, and keeps them elevated with a wedge her daughter gave her. Pt says she doesn't really have an appetite.

## 2022-12-16 NOTE — Telephone Encounter (Signed)
Spoke with the patient and advised her to stop taking Eliquis. We will check back in on her tomorrow to see if symptoms have resolved per Renee.

## 2022-12-17 NOTE — Telephone Encounter (Signed)
Patient states that she is still having some itching and burning. She states that it has improved a little bit. I will check on her tomorrow and if symptoms have gotten better, will start on xarelto per Renee.

## 2022-12-18 ENCOUNTER — Telehealth: Payer: Self-pay | Admitting: Cardiology

## 2022-12-18 ENCOUNTER — Other Ambulatory Visit: Payer: Self-pay | Admitting: *Deleted

## 2022-12-18 ENCOUNTER — Other Ambulatory Visit: Payer: Self-pay | Admitting: Physician Assistant

## 2022-12-18 DIAGNOSIS — Z79899 Other long term (current) drug therapy: Secondary | ICD-10-CM

## 2022-12-18 MED ORDER — RIVAROXABAN 15 MG PO TABS: 15.0000 mg | ORAL_TABLET | Freq: Every day | ORAL | 3 refills | Status: DC

## 2022-12-18 MED ORDER — RIVAROXABAN 15 MG PO TABS: 15.0000 mg | ORAL_TABLET | Freq: Every day | ORAL | 0 refills | Status: DC

## 2022-12-18 MED ORDER — RIVAROXABAN 20 MG PO TABS: 20.0000 mg | ORAL_TABLET | Freq: Every day | ORAL | 3 refills | Status: DC

## 2022-12-18 NOTE — Telephone Encounter (Signed)
Patient reports improvement in her symptoms. She is agreeable to trying xarelto. Prescription has been sent in.

## 2022-12-18 NOTE — Telephone Encounter (Signed)
Patient is returning a phone call.  

## 2022-12-18 NOTE — Telephone Encounter (Signed)
Called pt to f/u.  Reports had a missed call from our office. Advised that Carly RN called and spoke with her regarding changes recommended by Francis Dowse:  " Please have her stop the Eliquis if she is having diffuse itching since starting it. Please have triage RN follow up tomorrow as well to see if resolved off the medication and let me know Plan to start Xarelto 15mg  daily Friday if itching is better  Francis Dowse, [name]"   Pt recalls conversation but did not know if someone else called.  Advised pt I do not see any additional calls from our office.  Pt concerned about cost of starting Xarelto.  Reports Eliquis cost $40.  Advised pt I would get her samples of Xarelto 15 mg PO.

## 2022-12-21 ENCOUNTER — Telehealth: Payer: Self-pay | Admitting: Physician Assistant

## 2022-12-21 ENCOUNTER — Telehealth: Payer: Self-pay | Admitting: Cardiology

## 2022-12-21 NOTE — Telephone Encounter (Signed)
Pt c/o medication issue:  1. Name of Medication:   Rivaroxaban (XARELTO) 15 MG TABS tablet    2. How are you currently taking this medication (dosage and times per day)? Take 1 tablet (15 mg total) by mouth daily with supper.   3. Are you having a reaction (difficulty breathing--STAT)? No  4. What is your medication issue?  Heather form [employer] called stating they received a request for Xarelto 15 MG and Xarelto 20 M. Herbert Seta would like to know the correct dosage for the medication.

## 2022-12-21 NOTE — Telephone Encounter (Signed)
  Pt c/o medication issue:  1. Name of Medication:   Rivaroxaban (XARELTO) 15 MG TABS tablet    2. How are you currently taking this medication (dosage and times per day)? Take 1 tablet (15 mg total) by mouth daily with supper.   3. Are you having a reaction (difficulty breathing--STAT)?   4. What is your medication issue? The patient reported experiencing a reaction to this medication. After taking it on Friday night, she was awake all night due to the reaction. She mentioned that the reaction was worse than what she experienced with Eliquis. She only took the medication once and did not take it again. She is going to the gym and she requested to call her daughter for recommendations

## 2022-12-21 NOTE — Telephone Encounter (Signed)
Returned call to pharmacist Herbert Seta, informed her patient is to be taking Xarelto 15mg  daily. Request for 20mg  cancelled.

## 2022-12-24 ENCOUNTER — Encounter (INDEPENDENT_AMBULATORY_CARE_PROVIDER_SITE_OTHER): Payer: Self-pay

## 2022-12-30 NOTE — Telephone Encounter (Signed)
Pt is calling checking on the below issue and said [facility] still has dr Casimiro Needle as specialists. Please call pt

## 2022-12-31 ENCOUNTER — Ambulatory Visit: Payer: Medicare PPO

## 2022-12-31 ENCOUNTER — Ambulatory Visit (HOSPITAL_COMMUNITY): Payer: Medicare PPO | Attending: Physician Assistant

## 2022-12-31 DIAGNOSIS — I4891 Unspecified atrial fibrillation: Secondary | ICD-10-CM | POA: Diagnosis present

## 2022-12-31 DIAGNOSIS — Z79899 Other long term (current) drug therapy: Secondary | ICD-10-CM | POA: Diagnosis present

## 2022-12-31 LAB — ECHOCARDIOGRAM COMPLETE: S' Lateral: 2.6 cm

## 2023-01-01 ENCOUNTER — Other Ambulatory Visit: Payer: Self-pay

## 2023-01-01 ENCOUNTER — Telehealth: Payer: Self-pay | Admitting: Cardiology

## 2023-01-01 LAB — CBC
Hematocrit: 35.8 % (ref 34.0–46.6)
Hemoglobin: 11.6 g/dL (ref 11.1–15.9)
MCH: 29.8 pg (ref 26.6–33.0)
MCHC: 32.4 g/dL (ref 31.5–35.7)
MCV: 92 fL (ref 79–97)
Platelets: 165 10*3/uL (ref 150–450)
RBC: 3.89 x10E6/uL (ref 3.77–5.28)
RDW: 14.4 % (ref 11.7–15.4)
WBC: 5.8 10*3/uL (ref 3.4–10.8)

## 2023-01-01 MED ORDER — FUROSEMIDE 20 MG PO TABS: ORAL_TABLET | ORAL | 0 refills | Status: DC

## 2023-01-01 MED ORDER — POTASSIUM CHLORIDE CRYS ER 10 MEQ PO TBCR: EXTENDED_RELEASE_TABLET | ORAL | 0 refills | Status: DC

## 2023-01-01 NOTE — Telephone Encounter (Signed)
Called patient; went to voicemail- left message with call back number

## 2023-01-01 NOTE — Telephone Encounter (Signed)
Took stat call after 5pm. Pts legs were swelling and she didn't want to go though the weekend without addressing it. Took it to Dr Elease Hashimoto who was still in the office. Recommended lasix 20mg  and klor-con as needed. Suggested to not take any until tomorrow. Pt stated understanding.   Pt also needs general cardio. Will forward that for Monday.

## 2023-01-01 NOTE — Telephone Encounter (Signed)
Pt c/o swelling: STAT is pt has developed SOB within 24 hours  How much weight have you gained and in what time span? unsure  If swelling, where is the swelling located? In her legs and lips  Are you currently taking a fluid pill? no  Are you currently SOB? no  Do you have a log of your daily weights (if so, list)?    Have you gained 3 pounds in a day or 5 pounds in a week?    Have you traveled recently? no   Patient wanting to see if she can go back on heart pill

## 2023-01-01 NOTE — Telephone Encounter (Signed)
Patient is returning call. And needs a call back today. Please advise

## 2023-01-01 NOTE — Telephone Encounter (Signed)
Attempted phone call to pt and left voicemail message to contact triage at [phone]. 

## 2023-01-04 ENCOUNTER — Telehealth: Payer: Self-pay | Admitting: Physician Assistant

## 2023-01-04 NOTE — Telephone Encounter (Signed)
Patient returned call

## 2023-01-04 NOTE — Telephone Encounter (Signed)
Left voicemail for patient to return call to office. 

## 2023-01-04 NOTE — Telephone Encounter (Signed)
  Pt is calling to get echo result. Also, pt said, her feet is still swelling. She made an appt with gen cards tomorrow with Dr. Servando Salina

## 2023-01-04 NOTE — Telephone Encounter (Signed)
  Renee Norberto Sorenson, [name] 01/01/2023  1:26 PM EDT     Echo notes normal heart strength.  She does have a leaky valve, though not causing her any symptoms, so would not recommend further work up or any intervention for that.     Does she want to try another blood thinner?    Called patient with her echo results. Patient stated she has had a hard time taking eliquis and xarelto. Patient stated she is not taking any blood thinners because it burns her skin. Patient also was asking about her leg swelling. Patient has lasix, but unsure if she is still taking it, since she was confused on what medication did what. Encouraged patient to have all her questions written down to ask Dr. Servando Salina tomorrow.

## 2023-01-05 ENCOUNTER — Ambulatory Visit: Payer: Medicare PPO | Attending: Cardiology | Admitting: Cardiology

## 2023-01-05 ENCOUNTER — Encounter: Payer: Self-pay | Admitting: Cardiology

## 2023-01-05 VITALS — BP 108/60 | HR 108 | Ht 61.0 in | Wt 112.6 lb

## 2023-01-05 DIAGNOSIS — E782 Mixed hyperlipidemia: Secondary | ICD-10-CM

## 2023-01-05 DIAGNOSIS — I480 Paroxysmal atrial fibrillation: Secondary | ICD-10-CM

## 2023-01-05 DIAGNOSIS — I4891 Unspecified atrial fibrillation: Secondary | ICD-10-CM

## 2023-01-05 MED ORDER — POTASSIUM CHLORIDE CRYS ER 10 MEQ PO TBCR: EXTENDED_RELEASE_TABLET | ORAL | 11 refills | Status: DC

## 2023-01-05 MED ORDER — FUROSEMIDE 20 MG PO TABS: ORAL_TABLET | ORAL | 11 refills | Status: DC

## 2023-01-05 MED ORDER — APIXABAN 2.5 MG PO TABS: 2.5000 mg | ORAL_TABLET | Freq: Two times a day (BID) | ORAL | 11 refills | Status: DC

## 2023-01-05 NOTE — Patient Instructions (Signed)
Medication Instructions:  STOP XARELTO  START: Eliquis 2.5 mg twice a day Lasix 20 mg every Tuesday and Friday Potassium Chloride 10 meq Tuesday and Friday *If you need a refill on your cardiac medications before your next appointment, please call your pharmacy*  Follow-Up: At [facility], you and your health needs are our priority.  As part of our continuing mission to provide you with exceptional heart care, we have created designated Provider Care Teams.  These Care Teams include your primary Cardiologist (physician) and Advanced Practice Providers (APPs -  Physician Assistants and Nurse Practitioners) who all work together to provide you with the care you need, when you need it.  We recommend signing up for the patient portal called "MyChart".  Sign up information is provided on this After Visit Summary.  MyChart is used to connect with patients for Virtual Visits (Telemedicine).  Patients are able to view lab/test results, encounter notes, upcoming appointments, etc.  Non-urgent messages can be sent to your provider as well.   To learn more about what you can do with MyChart, go to [email or web address].    Your next appointment:   6 month(s)  Provider:   Thomasene Ripple, DO

## 2023-01-05 NOTE — Progress Notes (Signed)
Cardiology Office Note:    Date:  01/09/2023   ID:  Heidi Moore, DOB 10-Nov-1919, MRN [id number]  PCP:  Karie Georges, MD  Cardiologist:  Thomasene Ripple, DO  Electrophysiologist:  None   Referring MD: Karie Georges, MD     History of Present Illness:    Heidi Moore is a 89 y.o. female with a hx of atrial fibrillation on Xarelto, hyperlipidemia, mild to moderate mitral regurgitation on recent echocardiogram, mild pulmonary hypertension, here today to have general cardiology evaluation.  She has been following with EP.  Past Medical History:  Diagnosis Date   AC (acromioclavicular) joint bone spurs    on right shoulder    Allergic rhinitis    Arthritis    Bursitis    right shoulder   Complication of anesthesia    headaches   Diverticulosis    DIVERTICULOSIS, COLON 10/15/2006   Qualifier: Diagnosis of  By: Alwyn Ren MD, Deborra Medina problem    treated by Dr Alben Spittle per patient   [id number](213.0)    sinus HA   History of blood transfusion 51yrs ago   no abnormal reaction noted   History of cystitis    History of skin cancer    Basal cell, Dr. Leta Speller   Hyperlipidemia    [place] Study LDL goal =<160    Hypotension    Joint pain    Joint swelling    Macular degeneration, dry    Osteopenia    Intolerance to Calcium,Actonel,Fosamax,Evista, Forteo: S/P Boniva 2007 to 03/2010   OSTEOPENIA 03/12/2008   Qualifier: Diagnosis of  By: Freddy Jaksch     Osteoporosis    PONV (postoperative nausea and vomiting)    Rosacea 04/22/2009   Qualifier: Diagnosis of  By: Alwyn Ren MD, William     Skin cancer 06/22/2017   per patient after excision right calf by Dr Nicholas Lose   SKIN CANCER, HX OF 03/12/2008   Qualifier: Diagnosis of  By: Freddy Jaksch     Urinary frequency    Vaginal atrophy    sees Dr. Huntley Dec    Past Surgical History:  Procedure Laterality Date   APPENDECTOMY     BUNIONECTOMY Bilateral    CATARACT EXTRACTION, BILATERAL     COLONOSCOPY     with Tics  (no further f/u as per GI due to age)    JOINT REPLACEMENT Left    knee    KNEE ARTHROPLASTY  05/18/2012   Procedure: COMPUTER ASSISTED TOTAL KNEE ARTHROPLASTY;  Surgeon: Eldred Manges, MD;  Location: [facility] OR;  Service: Orthopedics;  Laterality: Left;  Left Total Knee Arthroplasty, Cemented, Computer Assisted   lt knee      meniscus tear   MOHS SURGERY     right nostril for basel cell cancer    NOSE SURGERY     due to trama at age 77    TOTAL ABDOMINAL HYSTERECTOMY W/ BILATERAL SALPINGOOPHORECTOMY     for Endometriosis    TOTAL SHOULDER ARTHROPLASTY Right 09/11/2013   Procedure: TOTAL SHOULDER ARTHROPLASTY;  Surgeon: Eldred Manges, MD;  Location: [facility] OR;  Service: Orthopedics;  Laterality: Right;  Right Total Shoulder Arthroplasty    Current Medications: Current Meds  Medication Sig   acetaminophen (TYLENOL) 325 MG tablet Take 650 mg by mouth every 6 (six) hours as needed for mild pain or headache.   acetic acid-hydrocortisone (VOSOL-HC) OTIC solution Place 3 drops into both ears 2 (two) times daily.   apixaban (ELIQUIS) 2.5  MG TABS tablet Take 1 tablet (2.5 mg total) by mouth 2 (two) times daily.   Ascorbic Acid (VITAMIN C PO) Take 500 mg by mouth 2 (two) times daily.   B Complex-C (B-COMPLEX WITH VITAMIN C) tablet Take 1 tablet by mouth daily.   cholecalciferol (VITAMIN D) 1000 UNITS tablet Take 1,000 Units by mouth 2 (two) times daily.    Cyanocobalamin (VITAMIN B 12 PO) Take by mouth daily.   estradiol (ESTRACE) 0.5 MG tablet Take 0.5 mg by mouth daily.   fexofenadine (ALLEGRA ALLERGY CHILDRENS) 30 MG/5ML suspension 1/4 tsp daily   OVER THE COUNTER MEDICATION 2 (two) times daily. Preservision vitamin for eyes   triamcinolone (NASACORT) 55 MCG/ACT AERO nasal inhaler Place 1 spray into the nose daily.   VITAMIN A PO Take by mouth daily.   vitamin E 400 UNIT capsule Take 1 capsule by mouth daily.   [DISCONTINUED] furosemide (LASIX) 20 MG tablet 1 tablet (20 mg) as needed for leg  swelling.   [DISCONTINUED] potassium chloride SA (KLOR-CON M) 10 MEQ tablet 1 tablet (10 meq) as needed with furosemide.   [DISCONTINUED] Rivaroxaban (XARELTO) 15 MG TABS tablet Take 1 tablet (15 mg total) by mouth daily with supper.     Allergies:   Alendronate sodium, Anaplex hd, Aspirin, Azithromycin, Calcium, Ciprofloxacin, Codeine, Dorzolamide hcl-timolol mal, Doxycycline, Gabapentin, Hydrocodone, Ibandronate sodium, Ibuprofen, Penicillins, Raloxifene, Risedronate sodium, Sulfonamide derivatives, Teriparatide, and Travoprost   Social History   Socioeconomic History   Marital status: Widowed    Spouse name: Not on file   Number of children: 3   Years of education: Not on file   Highest education level: Some college, no degree  Occupational History    Employer: RETIRED  Tobacco Use   Smoking status: Never    Passive exposure: Never   Smokeless tobacco: Never  Vaping Use   Vaping status: Never Used  Substance and Sexual Activity   Alcohol use: No   Drug use: No   Sexual activity: Not Currently    Birth control/protection: Surgical  Other Topics Concern   Not on file  Social History Narrative   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 She lives alone. She goes to the gym and participates in aerobics 3 x week. She drinks 3 cups of coffee a day.         Social Determinants of Health   Financial Resource Strain: Low Risk  (07/10/2022)   Overall Financial Resource Strain (CARDIA)    Difficulty of Paying Living Expenses: Not hard at all  Food Insecurity: No Food  Insecurity (07/10/2022)   Hunger Vital Sign    Worried About Running Out of Food in the Last Year: Never true    Ran Out of Food in the Last Year: Never true  Transportation Needs: No Transportation Needs (07/10/2022)   PRAPARE - Administrator, Civil Service (Medical): No    Lack of Transportation (Non-Medical): No  Recent Concern: Transportation Needs - Barrister's clerk  Needs (05/24/2022)   PRAPARE - Administrator, Civil Service (Medical): Yes    Lack of Transportation (Non-Medical): No  Physical Activity: Insufficiently Active (07/10/2022)   Exercise Vital Sign    Days of Exercise per Week: 2 days    Minutes of Exercise per Session: 60 min  Stress: No Stress Concern Present (07/10/2022)   [employer] of Occupational Health - Occupational Stress Questionnaire    Feeling of Stress : Not at all  Recent Concern: Stress - Stress Concern Present (05/24/2022)   [place] [employer] of Occupational Health - Occupational Stress Questionnaire    Feeling of Stress : Very much  Social Connections: Moderately Integrated (07/10/2022)   Social Connection and Isolation Panel [NHANES]    Frequency of Communication with Friends and Family: More than three times a week    Frequency of Social Gatherings with Friends and Family: More than three times a week    Attends Religious Services: More than 4 times per year    Active Member of [employer] or Organizations: Yes    Attends Banker Meetings: More than 4 times per year    Marital Status: Widowed  Recent Concern: Social Connections - Moderately Isolated (05/24/2022)   Social Connection and Isolation Panel [NHANES]    Frequency of Communication with Friends and Family: More than three times a week    Frequency of Social Gatherings with Friends and Family: More than three times a week    Attends Religious Services: Never    Database administrator or Organizations: Yes    Attends Banker Meetings: 1 to 4 times  per year    Marital Status: Widowed     Family History: The patient's family history includes Allergic rhinitis in her daughter and son; Atrial fibrillation in her brother; Colon cancer in an other family member; Diabetes in her brother; Lung cancer in her sister; Stroke (age of onset: 22) in her mother.  ROS:   Review of Systems  Constitution: Negative for decreased appetite, fever and weight gain.  HENT: Negative for congestion, ear discharge, hoarse voice and sore throat.   Eyes: Negative for discharge, redness, vision loss in right eye and visual halos.  Cardiovascular: Negative for chest pain, dyspnea on exertion, leg swelling, orthopnea and palpitations.  Respiratory: Negative for cough, hemoptysis, shortness of breath and snoring.   Endocrine: Negative for heat intolerance and polyphagia.  Hematologic/Lymphatic: Negative for bleeding problem. Does not bruise/bleed easily.  Skin: Negative for flushing, nail changes, rash and suspicious lesions.  Musculoskeletal: Negative for arthritis, joint pain, muscle cramps, myalgias, neck pain and stiffness.  Gastrointestinal: Negative for abdominal pain, bowel incontinence, diarrhea and excessive appetite.  Genitourinary: Negative for decreased libido, genital sores and incomplete emptying.  Neurological: Negative for brief paralysis, focal weakness, headaches and loss of balance.  Psychiatric/Behavioral: Negative for altered mental status, depression and suicidal ideas.  Allergic/Immunologic: Negative for HIV exposure and persistent infections.    EKGs/Labs/Other Studies Reviewed:    The following studies were reviewed today:   EKG:  The ekg ordered today demonstrates   Recent Labs: 03/10/2022: TSH 3.69 12/11/2022: BUN 9; Creatinine, Ser 0.83; Potassium 4.2; Sodium 142 12/31/2022: Hemoglobin 11.6; Platelets 165  Recent Lipid Panel    Component Value Date/Time   CHOL 157 10/31/2021 1145   TRIG 63.0 10/31/2021 1145   HDL 66.30  10/31/2021 1145   CHOLHDL 2 10/31/2021 1145   VLDL 12.6 10/31/2021 1145   LDLCALC 78 10/31/2021 1145  Physical Exam:    VS:  BP 108/60 (BP Location: Right Arm, Patient Position: Sitting, Cuff Size: Normal)   Pulse (!) 108   Ht 5\' 1"  (1.549 m)   Wt 112 lb 9.6 oz (51.1 kg)   SpO2 96%   BMI 21.28 kg/m     Wt Readings from Last 3 Encounters:  01/05/23 112 lb 9.6 oz (51.1 kg)  12/11/22 108 lb 6.4 oz (49.2 kg)  11/10/22 102 lb (46.3 kg)     GEN: Well nourished, well developed in no acute distress HEENT: Normal NECK: No JVD; No carotid bruits LYMPHATICS: No lymphadenopathy CARDIAC: S1S2 noted,RRR, no murmurs, rubs, gallops RESPIRATORY:  Clear to auscultation without rales, wheezing or rhonchi  ABDOMEN: Soft, non-tender, non-distended, +bowel sounds, no guarding. EXTREMITIES: No edema, No cyanosis, no clubbing MUSCULOSKELETAL:  No deformity  SKIN: Warm and dry NEUROLOGIC:  Alert and oriented x 3, non-focal PSYCHIATRIC:  Normal affect, good insight  ASSESSMENT:    1. Paroxysmal atrial fibrillation (HCC)   2. Mixed hyperlipidemia    PLAN:     1.she is complaining about the use of xeralto and states that she prefer to get back on Eliquis.  So we will start Eliquis 2.5 mg twice daily.  Stop Xarelto today.  EKG shows accelerated junctional rhythm.  The patient is in agreement with the above plan. The patient left the office in stable condition.  The patient will follow up in   Medication Adjustments/Labs and Tests Ordered: Current medicines are reviewed at length with the patient today.  Concerns regarding medicines are outlined above.  Orders Placed This Encounter  Procedures   EKG 12-Lead   Meds ordered this encounter  Medications   furosemide (LASIX) 20 MG tablet    Sig: 1 tablet (20 mg) every Tuesday and Friday    Dispense:  10 tablet    Refill:  11   potassium chloride (KLOR-CON M) 10 MEQ tablet    Sig: 1 tablet (10 meq) every Tuesday and Friday.    Dispense:   10 tablet    Refill:  11   apixaban (ELIQUIS) 2.5 MG TABS tablet    Sig: Take 1 tablet (2.5 mg total) by mouth 2 (two) times daily.    Dispense:  60 tablet    Refill:  11    Patient Instructions  Medication Instructions:  STOP XARELTO  START: Eliquis 2.5 mg twice a day Lasix 20 mg every Tuesday and Friday Potassium Chloride 10 meq Tuesday and Friday *If you need a refill on your cardiac medications before your next appointment, please call your pharmacy*  Follow-Up: At [facility], you and your health needs are our priority.  As part of our continuing mission to provide you with exceptional heart care, we have created designated Provider Care Teams.  These Care Teams include your primary Cardiologist (physician) and Advanced Practice Providers (APPs -  Physician Assistants and Nurse Practitioners) who all work together to provide you with the care you need, when you need it.  We recommend signing up for the patient portal called "MyChart".  Sign up information is provided on this After Visit Summary.  MyChart is used to connect with patients for Virtual Visits (Telemedicine).  Patients are able to view lab/test results, encounter notes, upcoming appointments, etc.  Non-urgent messages can be sent to your provider as well.   To learn more about what you can do with MyChart, go to [email or web address].    Your next appointment:   6 month(s)  Provider:   Thomasene Ripple, DO       Adopting a Healthy Lifestyle.  Know what a healthy weight is for you (roughly BMI <25) and aim to maintain this   Aim for 7+ servings of fruits and vegetables daily   65-80+ fluid ounces of water or unsweet tea for healthy kidneys   Limit to max 1 drink of alcohol per day; avoid smoking/tobacco   Limit animal fats in diet for cholesterol and heart health - choose grass fed whenever available   Avoid highly processed foods, and foods high in saturated/trans fats   Aim for low stress - take  time to unwind and care for your mental health   Aim for 150 min of moderate intensity exercise weekly for heart health, and weights twice weekly for bone health   Aim for 7-9 hours of sleep daily   When it comes to diets, agreement about the perfect plan isnt easy to find, even among the experts. Experts at the [facility] [employer] developed an idea known as the Healthy Eating Plate. Just imagine a plate divided into logical, healthy portions.   The emphasis is on diet quality:   Load up on vegetables and fruits - one-half of your plate: Aim for color and variety, and remember that potatoes dont count.   Go for whole grains - one-quarter of your plate: Whole wheat, barley, wheat berries, quinoa, oats, brown rice, and foods made with them. If you want pasta, go with whole wheat pasta.   Protein power - one-quarter of your plate: Fish, chicken, beans, and nuts are all healthy, versatile protein sources. Limit red meat.   The diet, however, does go beyond the plate, offering a few other suggestions.   Use healthy plant oils, such as olive, canola, soy, corn, sunflower and peanut. Check the labels, and avoid partially hydrogenated oil, which have unhealthy trans fats.   If youre thirsty, drink water. Coffee and tea are good in moderation, but skip sugary drinks and limit milk and dairy products to one or two daily servings.   The type of carbohydrate in the diet is more important than the amount. Some sources of carbohydrates, such as vegetables, fruits, whole grains, and beans-are healthier than others.   Finally, stay active  Signed, Thomasene Ripple, DO  01/09/2023 2:22 AM    [facility]

## 2023-01-06 ENCOUNTER — Ambulatory Visit: Payer: Medicare PPO | Admitting: Family Medicine

## 2023-01-07 ENCOUNTER — Telehealth: Payer: Self-pay | Admitting: Cardiology

## 2023-01-07 NOTE — Telephone Encounter (Signed)
States took first dose of Eliquis last night. She states having reaction as listed.  States this morning she has put in drops into her eyes and can "see now" .  Does have macular degeneration.  States bottom lip still swollen. Unable to sleep. Skin still stinging  She wants to take allegra to help with reaction. Also will need something different as she states has had this before in the past and had reaction .  She states she had a worst reaction on Xarelto.   Please advise on allegra as well

## 2023-01-07 NOTE — Telephone Encounter (Signed)
Pt c/o medication issue:  1. Name of Medication: apixaban (ELIQUIS) 2.5 MG TABS tablet   2. How are you currently taking this medication (dosage and times per day)?  Take 1 tablet (2.5 mg total) by mouth 2 (two) times daily.   3. Are you having a reaction (difficulty breathing--STAT)? no  4. What is your medication issue? Patient states she took this medication last night and she felt like she was burning up, felting stinging down her arms and across her shoulders.  It dried her eyes, she had to put drop in her eyes this morning.  She felt her heart beat fast, she states it has calm down now.  She said her bottom lip was swollen as well. She said she has taken this medication before and had a reaction to it.  She wants to know if she can take Allergia counteract the reaction.

## 2023-01-08 ENCOUNTER — Ambulatory Visit: Payer: Medicare PPO | Admitting: Physician Assistant

## 2023-01-12 LAB — LAB REPORT - SCANNED
A1c: 5.1
EGFR: 53

## 2023-01-13 ENCOUNTER — Telehealth: Payer: Self-pay | Admitting: Cardiology

## 2023-01-13 NOTE — Telephone Encounter (Signed)
Pt c/o medication issue:  1. Name of Medication: Dioxin 62.5 mcg  Torsemide 5 mg   2. How are you currently taking this medication (dosage and times per day)?   3. Are you having a reaction (difficulty breathing--STAT)?   4. What is your medication issue? Patient is requesting call back to discuss these medications she has recently started taking. Requesting call back.

## 2023-01-13 NOTE — Telephone Encounter (Signed)
Spoke to patient Dr.Tobb's advice given.Appointment scheduled with Carlos Levering NP 9/9 at 2:45 pm.

## 2023-01-13 NOTE — Telephone Encounter (Signed)
Allegra is fine to take. Ideally do not want her to start Coumadin given her age. If she has had allergic reactions to Eliquis and Xarelto, could try Pradaxa next. Her CrCl is 29 though and data is limited here (dose recommendation would be for Pradaxa 75mg  BID based on her clearance, however patients with CrCl < 30 were excluded from RELY trial, and the RELY trial studied the 110mg  and 150mg  dose, not the 75mg  dose). It does look like it's a Tier 1 on her formulary though and would be covered quite well. Would defer to MD for final input given lack of ideal options.

## 2023-01-13 NOTE — Telephone Encounter (Signed)
Left voicemail to return call to office.

## 2023-01-13 NOTE — Telephone Encounter (Signed)
Pt returning call

## 2023-01-13 NOTE — Telephone Encounter (Signed)
Patient states she had swelling over the weekend.  She states she had been taken off Digoxin and Torsemide a while back and unsure why. States she had been on this for years and was helping with her swelling/edema.   She went to ED on Monday, and [facility]. She had weight gain of 8 lbs over the weekend.  Unsure of orders but she states re starting the digoxin and torsemide and the swelling has all but gone.   She started  on a baby aspirin on Friday as she asked the pharmacist what else she could take for a blood thinner since she can't take the other thinners.  She had issues with them.  She is taking 81 mg TWICE a day,  in the morning and evening. Seems to be by discussion, enteric coated.   **Please see earlier message regarding Eliquis)  She states She has asked about re-starting the Digoxin and Torsemide.  Please advise if she can restart digoxin and Torsemide and also how often she needs to be taking the aspirin.

## 2023-01-14 NOTE — Telephone Encounter (Signed)
Another nurse spoke to the pt yesterday. See chart.

## 2023-01-16 NOTE — Progress Notes (Unsigned)
Cardiology Clinic Note   Date: 01/18/2023 ID: Heidi Moore, DOB 30-Nov-1919, MRN [id number]  Primary Cardiologist:  Thomasene Ripple, DO  Patient Profile    Heidi CHUMNEY is a 89 y.o. female who presents to the clinic today for medication management,     Past medical history significant for: PAF. 3-day ZIO 12/23/2022: Heart rate 57 to 197 bpm, average 92 bpm.  100% A-fib burden.  9 VT episodes, longest 7 beats.  Rare supraventricular ectopy.  Occasional ventricular ectopy 2.1%. Echo 12/31/2022: EF 55 to 60%.  No RWMA.  Indeterminate diastolic parameters.  Normal RV function.  Mildly elevated PA pressure.  Moderate LAE.  Severe RAE.  Dilated IVC, RA pressure 15 mmHg. Hypothyroidism. Venous insufficiency. Macular degeneration.     History of Present Illness    Heidi Moore was first evaluated by Dr. Lalla Brothers on 01/26/2020 for lower extremity edema at the request of Dr. Hassan Rowan.  Patient reported edema has been a chronic issue for her and Heidi Moore is followed by local vein specialist.  Heidi Moore also reported episodes of flushing.  Compression stockings were recommended.  Patient was not seen again until visit with Francis Dowse, [name] on 12/11/2022 for edema and dizziness.  Patient reported Heidi Moore was seen at [facility] and found to have an irregular rhythm and chest x-ray and was referred to pulmonary and cardiology.  Heidi Moore was found to be in A-fib and started on digoxin and torsemide.  Heidi Moore self stopped torsemide secondary to feeling dehydrated.  Patient reported sometimes hearing her heartbeat at night.  Heidi Moore noted dizziness and possibly changes in her vision that Heidi Moore attributed to the medications.  Dizziness described as subtle constant sense of feeling off balance without weakness, lightheadedness, near-syncope or syncope.  Heidi Moore self held digoxin x 2 days without change or improvement in her symptoms.  Patient was instructed to stop torsemide and referral made to vein and vascular surgery.  Heidi Moore was in rate  controlled A-fib at the time of her visit.  With CHA2DS2-VASc score of 3 Heidi Moore was started on Eliquis.  3-day ZIO showed 100% A-fib burden.  Echo showed normal LV/RV function and BAE.  Patient contacted the office to report diffuse itching on Eliquis.  Heidi Moore was instructed to stop Eliquis to see if it itching resolved.  Heidi Moore was started on Xarelto.  Patient reported worse reaction to Xarelto.  Patient contacted the office with complaints of lower extremity edema.  Heidi Moore was started on Lasix and potassium.  Patient was seen by Dr. Servando Salina on 01/05/2023 to establish care with general cardiology.  Patient requested to return to taking Eliquis.  EKG showed accelerated junctional rhythm.  Patient contacted the office on 01/07/2023 with reports of bottom lip swelling and stinging of her skin after taking Eliquis.  Heidi Moore was inquiring if Heidi Moore can take Allegra.  Pharm.D. documented that Coumadin would not be ideal at her age.  Pradaxa 75 mg twice daily would be the next option to try.  Patient then contacted the office on 01/13/2023 stating that Heidi Moore would like to be restarted on digoxin and torsemide.  Dr. Servando Salina was consulted and requested patient come in for an APP visit.  Today, patient is accompanied by her daughter. Heidi Moore reports Heidi Moore self-started back on digoxin and torsemide one week ago and feels improved. Lower extremity edema is almost resolved. Heidi Moore reports Heidi Moore wakes with extremities normal and edema progresses throughout the day. Heidi Moore elevates her legs on a wedge at night and wears compression stockings during  the day. Heidi Moore reports brisk diuresis on torsemide. Heidi Moore has not been taking furosemide since starting back on torsemide. Heidi Moore reports occasional palpitations described as her heart beating fast particularly at night. Heidi Moore stopped taking Eliquis and started on a baby aspirin. Had a long discussion with patient and daughter about the dangers of starting and stopping medications on her own. Discussed trying Pradaxa 75 mg bid  to see if Heidi Moore can tolerate it (dose confirmed with Pharm D).      ROS: All other systems reviewed and are otherwise negative except as noted in History of Present Illness.  Studies Reviewed    EKG Interpretation Date/Time:  Monday January 18 2023 15:08:19 EDT Ventricular Rate:  86 PR Interval:    QRS Duration:  76 QT Interval:  330 QTC Calculation: 394 R Axis:   102  Text Interpretation: Atrial fibrillation Rightward axis Pulmonary disease pattern Abnormal QRS-T angle, consider primary T wave abnormality When compared with ECG of 11-Dec-2022 11:27, QRS axis Shifted right Confirmed by Carlos Levering [phone]) on 01/18/2023 3:14:12 PM    Risk Assessment/Calculations     CHA2DS2-VASc Score = 3   This indicates a 3.2% annual risk of stroke. The patient's score is based upon: CHF History: 0 HTN History: 0 Diabetes History: 0 Stroke History: 0 Vascular Disease History: 0 Age Score: 2 Gender Score: 1             Physical Exam    VS:  BP 112/60   Pulse 86   Ht 5\' 1"  (1.549 m)   Wt 108 lb 6.4 oz (49.2 kg)   SpO2 98%   BMI 20.48 kg/m  , BMI Body mass index is 20.48 kg/m.  GEN: Well nourished, well developed, in no acute distress. Neck: No JVD or carotid bruits. Cardiac: Irregular rhythm, regular rate. No murmurs. No rubs or gallops.   Respiratory:  Respirations regular and unlabored. Clear to auscultation without rales, wheezing or rhonchi. GI: Soft, nontender, nondistended. Extremities: Radials/DP/PT 2+ and equal bilaterally. No clubbing or cyanosis. Mild lower extremity edema.  Skin: Warm and dry, no rash. Neuro: Strength intact.  Assessment & Plan    PAF.  Onset possibly July 2024 and initially started on digoxin and torsemide by Dr. [name] Riding at [facility].  Dr. [name] Riding did not accept her insurance so Heidi Moore had to change offices. Heidi Moore was evaluated by Francis Dowse, [name] August 2024 who stopped digoxin and torsemide and started on Eliquis.  3-day ZIO August 2024  showed 100% A-fib burden.  Patient with allergic reaction to Xarelto and Eliquis. Heidi Moore self started digoxin and torsemide one week and reports Heidi Moore feels improved. Patient reports occasional palpitations described as heart racing particularly at night. EKG shows afib, 86 bpm. Patient is instructed to stop digoxin and do not restart it. Start Pradaxa 75 mg bid (dose confirmed with Pharm D). Patient is provided with 7 day prescription to see if Heidi Moore tolerates it.  Case discussed with Dr. Royann Shivers who agrees with stopping digoxin. If patient does not tolerate Pradaxa, would be reasonable to maintain patient on aspirin 81 mg. Heidi Moore is referred to the afib clinic.  Lower extremity edema. Patient reports improved lower extremity edema since restarting torsemide. Heidi Moore has not taken Lasix since starting back on torsemide. Heidi Moore reports brisk diuresis. Heidi Moore is also managing edema with compression socks and elevation. Mild edema bilaterally on exam today. Heidi Moore is instructed to not take Lasix. Continue torsemide 5 mg daily. Do not take potassium for now. Will get BMP  today and advise further regarding potassium supplementation.   Disposition: BMP today. Pradaxa 75 mg bid. Refer to afib clinic.          Signed, Etta Grandchild. Annise Boran, DNP, NP-C

## 2023-01-18 ENCOUNTER — Encounter: Payer: Self-pay | Admitting: Student

## 2023-01-18 ENCOUNTER — Ambulatory Visit: Payer: Medicare PPO | Attending: Student | Admitting: Student

## 2023-01-18 VITALS — BP 112/60 | HR 86 | Ht 61.0 in | Wt 108.4 lb

## 2023-01-18 DIAGNOSIS — Z79899 Other long term (current) drug therapy: Secondary | ICD-10-CM | POA: Diagnosis not present

## 2023-01-18 DIAGNOSIS — I480 Paroxysmal atrial fibrillation: Secondary | ICD-10-CM

## 2023-01-18 DIAGNOSIS — R600 Localized edema: Secondary | ICD-10-CM

## 2023-01-18 MED ORDER — DABIGATRAN ETEXILATE MESYLATE 75 MG PO CAPS: 75.0000 mg | ORAL_CAPSULE | Freq: Two times a day (BID) | ORAL | 0 refills | Status: DC

## 2023-01-18 NOTE — Patient Instructions (Signed)
Medication Instructions:  STOP DIGOXIN STOP LASIX STOP ASPIRIN START PRADAXA 75 MG. TAKE ONE TABLET TWICE DAILY CONTINUE TAKING THE TORSEMIDE 5 MG DAILY *If you need a refill on your cardiac medications before your next appointment, please call your pharmacy*   Lab Work: BMP If you have labs (blood work) drawn today and your tests are completely normal, you will receive your results only by: MyChart Message (if you have MyChart) OR A paper copy in the mail If you have any lab test that is abnormal or we need to change your treatment, we will call you to review the results.   Testing/Procedures: NONE   Follow-Up: At [facility], you and your health needs are our priority.  As part of our continuing mission to provide you with exceptional heart care, we have created designated Provider Care Teams.  These Care Teams include your primary Cardiologist (physician) and Advanced Practice Providers (APPs -  Physician Assistants and Nurse Practitioners) who all work together to provide you with the care you need, when you need it.  We recommend signing up for the patient portal called "MyChart".  Sign up information is provided on this After Visit Summary.  MyChart is used to connect with patients for Virtual Visits (Telemedicine).  Patients are able to view lab/test results, encounter notes, upcoming appointments, etc.  Non-urgent messages can be sent to your provider as well.   To learn more about what you can do with MyChart, go to [email or web address].    Your next appointment:   2 month(s)  Provider:   Thomasene Ripple, DO

## 2023-01-19 ENCOUNTER — Other Ambulatory Visit (HOSPITAL_COMMUNITY): Payer: Self-pay

## 2023-01-19 ENCOUNTER — Telehealth: Payer: Self-pay | Admitting: Pharmacy Technician

## 2023-01-19 ENCOUNTER — Ambulatory Visit: Payer: Medicare PPO | Admitting: Family Medicine

## 2023-01-19 LAB — BASIC METABOLIC PANEL
BUN/Creatinine Ratio: 13 (ref 12–28)
BUN: 13 mg/dL (ref 10–36)
CO2: 24 mmol/L (ref 20–29)
Calcium: 9.9 mg/dL (ref 8.7–10.3)
Chloride: 103 mmol/L (ref 96–106)
Creatinine, Ser: 0.98 mg/dL (ref 0.57–1.00)
Glucose: 77 mg/dL (ref 70–99)
Potassium: 4.8 mmol/L (ref 3.5–5.2)
Sodium: 140 mmol/L (ref 134–144)
eGFR: 52 mL/min/{1.73_m2} — ABNORMAL LOW (ref >59)

## 2023-01-19 NOTE — Telephone Encounter (Signed)
Pharmacy Patient Advocate Encounter  Insurance verification completed.    The patient is insured through [facility]   Ran test claim for pradaxa. Currently a quantity of 14 is a 7 day supply and the co-pay is $10.00. Received a fax from [facility] pharmacy requesting prior auth. Prior auth not needed at this time.  This test claim was processed through [facility]- copay amounts may vary at other pharmacies due to pharmacy/plan contracts, or as the patient moves through the different stages of their insurance plan.

## 2023-01-21 ENCOUNTER — Ambulatory Visit: Payer: Medicare PPO | Admitting: Vascular Surgery

## 2023-01-21 ENCOUNTER — Telehealth: Payer: Self-pay | Admitting: Cardiology

## 2023-01-21 ENCOUNTER — Encounter: Payer: Self-pay | Admitting: Vascular Surgery

## 2023-01-21 VITALS — BP 95/56 | HR 83 | Temp 98.4°F | Resp 18 | Ht 61.0 in | Wt 106.7 lb

## 2023-01-21 DIAGNOSIS — R600 Localized edema: Secondary | ICD-10-CM | POA: Diagnosis not present

## 2023-01-21 DIAGNOSIS — M7989 Other specified soft tissue disorders: Secondary | ICD-10-CM

## 2023-01-21 NOTE — Telephone Encounter (Signed)
Left voicemail to return call to office.

## 2023-01-21 NOTE — Telephone Encounter (Signed)
Pt is requesting a callback regarding her being back on Eliquis. She'd like to discuss options since she stated she can't afford Praxada. She leaves out at 1pm for another appt so if no answer she'd like a message to be left so that she can return the call. Please advise

## 2023-01-21 NOTE — Telephone Encounter (Signed)
Patient did not pick up Pradaxa as not covered by insurance and cost was  $116 for the 14 pills.  This via pharmacy.  They stated they sent a PA over.

## 2023-01-21 NOTE — Telephone Encounter (Signed)
Patient was prescribed 7 days of Pradaxa to see if she was able to tolerate it. The PA was canceled because the medication did not require a PA. Patient can pick up Pradax at her pharmacy

## 2023-01-21 NOTE — Telephone Encounter (Signed)
I see a PA on Pradaxa and then cancelled?  I am not sure.  What options for her?

## 2023-01-21 NOTE — Progress Notes (Signed)
Patient ID: Heidi Moore, female   DOB: 07/04/1919, 89 y.o.   MRN: [id number]  Reason for Consult: New Patient (Initial Visit) (C/o bilateral LE swelling since April 2024 )   Referred by Karie Georges, MD  Subjective:     HPI:  Heidi Moore is a 89 y.o. female without significant vascular history.  She has been placed Eliquis and subsequently Xarelto although she has not been able to tolerate this.  This was for atrial fibrillation.  She does have swelling of her bilateral lower extremity currently taking torsemide 5 mg bilaterally.  She states that she previously had multiple varicosities of her bilateral lower extremities after her pregnancies many years ago and underwent vein stripping with treatment of varicose veins.  She does have thinning skin and easy bruisability of the lower extremities and has multiple telangiectasias and brown skin discoloration of the legs.  She has not been wearing compression stockings.  She states that her legs do not really hurt at this time.  Past Medical History:  Diagnosis Date   AC (acromioclavicular) joint bone spurs    on right shoulder    Allergic rhinitis    Arthritis    Bursitis    right shoulder   Complication of anesthesia    headaches   Diverticulosis    DIVERTICULOSIS, COLON 10/15/2006   Qualifier: Diagnosis of  By: Alwyn Ren MD, Deborra Medina problem    treated by Dr Alben Spittle per patient   EGBTDVVO(160.7)    sinus HA   History of blood transfusion 76yrs ago   no abnormal reaction noted   History of cystitis    History of skin cancer    Basal cell, Dr. Leta Speller   Hyperlipidemia    Framingham Study LDL goal =<160    Hypotension    Joint pain    Joint swelling    Macular degeneration, dry    Osteopenia    Intolerance to Calcium,Actonel,Fosamax,Evista, Forteo: S/P Boniva 2007 to 03/2010   OSTEOPENIA 03/12/2008   Qualifier: Diagnosis of  By: Freddy Jaksch     Osteoporosis    PONV (postoperative nausea and vomiting)     Rosacea 04/22/2009   Qualifier: Diagnosis of  By: Alwyn Ren MD, William     Skin cancer 06/22/2017   per patient after excision right calf by Dr Nicholas Lose   SKIN CANCER, HX OF 03/12/2008   Qualifier: Diagnosis of  By: Freddy Jaksch     Urinary frequency    Vaginal atrophy    sees Dr. Huntley Dec   Family History  Problem Relation Age of Onset   Stroke Mother 31   Lung cancer Sister    Diabetes Brother    Atrial fibrillation Brother        coumadin   Colon cancer Other        1st Cousin    Allergic rhinitis Son    Allergic rhinitis Daughter    Past Surgical History:  Procedure Laterality Date   APPENDECTOMY     BUNIONECTOMY Bilateral    CATARACT EXTRACTION, BILATERAL     COLONOSCOPY     with Tics (no further f/u as per GI due to age)    JOINT REPLACEMENT Left    knee    KNEE ARTHROPLASTY  05/18/2012   Procedure: COMPUTER ASSISTED TOTAL KNEE ARTHROPLASTY;  Surgeon: Eldred Manges, MD;  Location: [facility] OR;  Service: Orthopedics;  Laterality: Left;  Left Total Knee Arthroplasty, Cemented, Computer Assisted   lt  knee      meniscus tear   MOHS SURGERY     right nostril for basel cell cancer    NOSE SURGERY     due to trama at age 80    TOTAL ABDOMINAL HYSTERECTOMY W/ BILATERAL SALPINGOOPHORECTOMY     for Endometriosis    TOTAL SHOULDER ARTHROPLASTY Right 09/11/2013   Procedure: TOTAL SHOULDER ARTHROPLASTY;  Surgeon: Eldred Manges, MD;  Location: [facility] OR;  Service: Orthopedics;  Laterality: Right;  Right Total Shoulder Arthroplasty    Short Social History:  Social History   Tobacco Use   Smoking status: Never    Passive exposure: Never   Smokeless tobacco: Never  Substance Use Topics   Alcohol use: No    Allergies  Allergen Reactions   Alendronate Sodium Nausea And Vomiting   Anaplex Hd Nausea And Vomiting   Aspirin Nausea And Vomiting   Azithromycin Nausea And Vomiting   Calcium Nausea And Vomiting   Ciprofloxacin     Patient states this caused nausea, dizziness and affected  her muscles   Codeine Nausea And Vomiting   Dorzolamide Hcl-Timolol Mal Other (See Comments)   Doxycycline Nausea And Vomiting   Gabapentin Other (See Comments)    Dizziness, Joint pain, blurred vision, changes in eyesight   Hydrocodone Nausea And Vomiting   Ibandronate Sodium     REACTION: severe body aches   Ibuprofen Nausea And Vomiting   Penicillins Nausea And Vomiting   Raloxifene Nausea And Vomiting   Risedronate Sodium Nausea And Vomiting   Sulfonamide Derivatives Nausea And Vomiting   Teriparatide Nausea And Vomiting   Travoprost     Current Outpatient Medications  Medication Sig Dispense Refill   acetaminophen (TYLENOL) 325 MG tablet Take 650 mg by mouth every 6 (six) hours as needed for mild pain or headache.     acetic acid-hydrocortisone (VOSOL-HC) OTIC solution Place 3 drops into both ears 2 (two) times daily. 10 mL 2   Ascorbic Acid (VITAMIN C PO) Take 500 mg by mouth 2 (two) times daily.     cholecalciferol (VITAMIN D) 1000 UNITS tablet Take 1,000 Units by mouth 2 (two) times daily.      Cyanocobalamin (VITAMIN B 12 PO) Take by mouth daily.     dabigatran (PRADAXA) 75 MG CAPS capsule Take 1 capsule (75 mg total) by mouth 2 (two) times daily. 14 capsule 0   estradiol (ESTRACE) 0.5 MG tablet Take 0.5 mg by mouth daily.     fexofenadine (ALLEGRA ALLERGY CHILDRENS) 30 MG/5ML suspension 1/4 tsp daily 118 mL 5   furosemide (LASIX) 20 MG tablet 1 tablet (20 mg) every Tuesday and Friday 10 tablet 11   OVER THE COUNTER MEDICATION 2 (two) times daily. Preservision vitamin for eyes     potassium chloride (KLOR-CON M) 10 MEQ tablet 1 tablet (10 meq) every Tuesday and Friday. 10 tablet 11   torsemide (DEMADEX) 5 MG tablet Take 5 mg by mouth daily.     triamcinolone (NASACORT) 55 MCG/ACT AERO nasal inhaler Place 1 spray into the nose daily. 1 each 5   VITAMIN A PO Take by mouth daily.     vitamin E 400 UNIT capsule Take 1 capsule by mouth daily.     No current  facility-administered medications for this visit.    Review of Systems  Constitutional:  Constitutional negative. HENT: HENT negative.  Eyes: Positive for loss of vision.   Respiratory: Respiratory negative.  Cardiovascular: Positive for irregular heartbeat and leg swelling.  GI:  Gastrointestinal negative.  Musculoskeletal: Musculoskeletal negative.  Skin: Skin negative.  Neurological: Positive for numbness.  Hematologic: Positive for bruises/bleeds easily.        Objective:  Objective   Vitals:   01/21/23 1343  BP: (!) 95/56  Pulse: 83  Resp: 18  Temp: 98.4 F (36.9 C)  SpO2: 94%      Physical Exam HENT:     Head: Normocephalic.     Nose: Nose normal.  Eyes:     Pupils: Pupils are equal, round, and reactive to light.  Cardiovascular:     Rate and Rhythm: Rhythm irregular.  Pulmonary:     Effort: Pulmonary effort is normal.  Abdominal:     General: Abdomen is flat.  Musculoskeletal:        General: Normal range of motion.     Right lower leg: Edema present.     Left lower leg: Edema present.  Skin:    General: Skin is warm.     Capillary Refill: Capillary refill takes less than 2 seconds.     Comments: Very thin skin  Neurological:     General: No focal deficit present.     Mental Status: She is alert.  Psychiatric:        Mood and Affect: Mood normal.     Data: RIGHT    CompressibilityPhasicitySpontaneityPropertiesThrombus  Aging  +---------+---------------+---------+-----------+----------+--------------+   CFV     Full           Yes      Yes                                   +---------+---------------+---------+-----------+----------+--------------+   FV Prox  Full           Yes      Yes                                   +---------+---------------+---------+-----------+----------+--------------+   FV Mid   Full           Yes      Yes                                    +---------+---------------+---------+-----------+----------+--------------+   FV DistalFull           Yes      Yes                                   +---------+---------------+---------+-----------+----------+--------------+   PFV     Full           Yes      Yes                                   +---------+---------------+---------+-----------+----------+--------------+   POP     Full           Yes      Yes                                   +---------+---------------+---------+-----------+----------+--------------+   PTV     Full  Yes                                            +---------+---------------+---------+-----------+----------+--------------+   PERO    Full           Yes                                            +---------+---------------+---------+-----------+----------+--------------+          +---------+---------------+---------+-----------+----------+--------------+   LEFT    CompressibilityPhasicitySpontaneityPropertiesThrombus  Aging  +---------+---------------+---------+-----------+----------+--------------+   CFV     Full           Yes      Yes                                   +---------+---------------+---------+-----------+----------+--------------+   SFJ     Full           Yes      Yes                                   +---------+---------------+---------+-----------+----------+--------------+   FV Prox  Full           Yes      Yes                                   +---------+---------------+---------+-----------+----------+--------------+   FV Mid   Full           Yes      Yes                                   +---------+---------------+---------+-----------+----------+--------------+   FV DistalFull           Yes      Yes                                   +---------+---------------+---------+-----------+----------+--------------+   PFV     Full            Yes      Yes                                   +---------+---------------+---------+-----------+----------+--------------+   POP     Full           Yes      Yes                                   +---------+---------------+---------+-----------+----------+--------------+   PTV     Full           Yes                                            +---------+---------------+---------+-----------+----------+--------------+  PERO    Full           Yes                                            +---------+---------------+---------+-----------+----------+--------------+     Summary:  RIGHT:  - No evidence of deep vein thrombosis in the lower extremity. No indirect  evidence of obstruction proximal to the inguinal ligament.  - There is no evidence of superficial venous thrombosis.    - Right popliteal fossa complex cystic structure measuring approximately  4.6 x 1.0 x 2.8cm. sonographic findings are consistent with Baker's cyst.    LEFT:  - No evidence of deep vein thrombosis in the lower extremity. No indirect  evidence of obstruction proximal to the inguinal ligament.  - There is no evidence of superficial venous thrombosis.         Assessment/Plan:    89 year old female with bilateral lower extremity swelling which appears to be multifactorial.  No evidence of DVT and from this standpoint would not require anticoagulation although she does have atrial fibrillation.  I have recommended elevation of her legs when she is recumbent and gentle knee-high compression stockings for control of her swelling from a vascular standpoint.  She can see me on an as-needed basis.     Maeola Harman MD [facility]

## 2023-01-22 NOTE — Telephone Encounter (Signed)
Would get her set up with GoodRx coupon, avoids insurance and copay would be cheaper.

## 2023-01-22 NOTE — Telephone Encounter (Signed)
Spoke with Dr Servando Salina. Will hold off on warfarin now and Dr Servando Salina will discuss options with patient at next visit.

## 2023-01-22 NOTE — Telephone Encounter (Signed)
Call to patient to discuss Pradaxa. She answers but then no response.  Call back and able to reach her.   She states she cannot afford the cost even with Good RX. She is on a fixed income.  She ask if she can try the Eliquis again.  She only used it 2 times before. She also states another doctor at another office stated she didn't need this. She was placed on stronger support hose and that she didn't need the medications.  She states the report was to be sent to Dr Servando Salina. Again she ask if she take Eliquis  Please advise.

## 2023-01-22 NOTE — Telephone Encounter (Signed)
Prior note states: "Patient contacted the office on 01/07/2023 with reports of bottom lip swelling and stinging of her skin after taking Eliquis." Lip swelling would indicate allergic reaction and would not recommend trying again. This call was made 2 days after she was changed from Xarelto to Eliquis at 01/05/23 MD appt. She is being prescribed anticoag for her afib, support hose does not provide any stroke prophylaxis for patients in afib. If she has tried Eliquis and Xarelto and cannot afford Pradaxa, the only other option would be Coumadin which really is not ideal in a 89 year old. Not sure what else to recommend, would forward to MD for input.

## 2023-02-01 ENCOUNTER — Telehealth: Payer: Self-pay | Admitting: Cardiology

## 2023-02-01 NOTE — Telephone Encounter (Signed)
Spoke with patient and she states she started pradexa on Friday. She states the side effects are worse. She feels like it was the cause of her blurred vision, swelling in her legs, and her being dizzy. She also states her skin has a burning sensation. I did suggest going to ED being that she has had the same symptoms since Friday night. She states she will not go anywhere she does not feel up to it. She would like to know if she can take a half of the pradaxa. She states she is older and her body reacts to medications.  Please advise.

## 2023-02-01 NOTE — Telephone Encounter (Signed)
Pradaxa comes in a capsule and cannot be cut in half. She is already taking the lower of the 2 available doses as well.

## 2023-02-01 NOTE — Telephone Encounter (Signed)
Pt c/o medication issue:  1. Name of Medication: dabigatran (PRADAXA) 75 MG CAPS capsule   2. How are you currently taking this medication (dosage and times per day)? As written  3. Are you having a reaction (difficulty breathing--STAT)? No   4. What is your medication issue?  Pt states she felt so sick after this medication she didn't take it. She asked if she can cut it in half. Please advise.

## 2023-02-02 ENCOUNTER — Encounter (HOSPITAL_COMMUNITY): Payer: Self-pay | Admitting: Physician Assistant

## 2023-02-02 ENCOUNTER — Ambulatory Visit (HOSPITAL_COMMUNITY)
Admission: RE | Admit: 2023-02-02 | Discharge: 2023-02-02 | Disposition: A | Discharge status: Home / Self Care | Payer: Medicare PPO | Source: Ambulatory Visit | Attending: Physician Assistant | Admitting: Physician Assistant

## 2023-02-02 VITALS — BP 110/64 | HR 112 | Ht 61.0 in | Wt 109.6 lb

## 2023-02-02 DIAGNOSIS — D6869 Other thrombophilia: Secondary | ICD-10-CM | POA: Insufficient documentation

## 2023-02-02 DIAGNOSIS — R9431 Abnormal electrocardiogram [ECG] [EKG]: Secondary | ICD-10-CM | POA: Diagnosis not present

## 2023-02-02 DIAGNOSIS — I4819 Other persistent atrial fibrillation: Secondary | ICD-10-CM | POA: Diagnosis not present

## 2023-02-02 DIAGNOSIS — I4891 Unspecified atrial fibrillation: Secondary | ICD-10-CM | POA: Diagnosis present

## 2023-02-02 NOTE — Progress Notes (Signed)
Primary Care Physician: Karie Georges, MD Primary Cardiologist: Thomasene Ripple, DO Electrophysiologist: Lanier Prude, MD  Referring Physician: Justice Deeds is a 89 y.o. female with a history of HLD, MR, pulm HTN, atrial fibrillation who presents for follow up in the [facility].  She was initially rate controlled with digoxin. Patient has a CHADS2VASC score of 3. She has tried Eliquis, Xarelto, and Pradaxa but could not tolerate any of these. She wore a cardiac monitor 12/2022 which showed 100% afib burden, avg HR 90s.  On follow up today, patient denies any cardiac awareness of her arrhythmia. She remains very active for her age and goes to the gym regularly. She does have chronic lower extremity edema which is controlled with compression stockings and torsemide.   Today, she denies symptoms of palpitations, chest pain, shortness of breath, orthopnea, PND, dizziness, presyncope, syncope, snoring, daytime somnolence, bleeding, or neurologic sequela. The patient is tolerating medications without difficulties and is otherwise without complaint today.    Atrial Fibrillation Risk Factors:  she does not have symptoms or diagnosis of sleep apnea. she does not have a history of rheumatic fever.   Atrial Fibrillation Management history:  Previous antiarrhythmic drugs: none Previous cardioversions: none Previous ablations: none Anticoagulation history: Eliquis, Xarelto, Pradaxa   ROS- All systems are reviewed and negative except as per the HPI above.  Past Medical History:  Diagnosis Date   AC (acromioclavicular) joint bone spurs    on right shoulder    Allergic rhinitis    Arthritis    Bursitis    right shoulder   Complication of anesthesia    headaches   Diverticulosis    DIVERTICULOSIS, COLON 10/15/2006   Qualifier: Diagnosis of  By: Alwyn Ren MD, Deborra Medina problem    treated by Dr Alben Spittle per patient   [id number](086.5)     sinus HA   History of blood transfusion 23yrs ago   no abnormal reaction noted   History of cystitis    History of skin cancer    Basal cell, Dr. Leta Speller   Hyperlipidemia    [place] Study LDL goal =<160    Hypotension    Joint pain    Joint swelling    Macular degeneration, dry    Osteopenia    Intolerance to Calcium,Actonel,Fosamax,Evista, Forteo: S/P Boniva 2007 to 03/2010   OSTEOPENIA 03/12/2008   Qualifier: Diagnosis of  By: Freddy Jaksch     Osteoporosis    PONV (postoperative nausea and vomiting)    Rosacea 04/22/2009   Qualifier: Diagnosis of  By: Alwyn Ren MD, William     Skin cancer 06/22/2017   per patient after excision right calf by Dr Nicholas Lose   SKIN CANCER, HX OF 03/12/2008   Qualifier: Diagnosis of  By: Freddy Jaksch     Urinary frequency    Vaginal atrophy    sees Dr. Huntley Dec    Current Outpatient Medications  Medication Sig Dispense Refill   acetaminophen (TYLENOL) 325 MG tablet Take 650 mg by mouth every 6 (six) hours as needed for mild pain or headache.     acetic acid-hydrocortisone (VOSOL-HC) OTIC solution Place 3 drops into both ears 2 (two) times daily. 10 mL 2   Ascorbic Acid (VITAMIN C PO) Take 500 mg by mouth 2 (two) times daily.     cholecalciferol (VITAMIN D) 1000 UNITS tablet Take 1,000 Units by mouth 2 (two) times daily.  Cyanocobalamin (VITAMIN B 12 PO) Take by mouth daily.     estradiol (ESTRACE) 0.5 MG tablet Take 0.5 mg by mouth daily.     fexofenadine (ALLEGRA ALLERGY CHILDRENS) 30 MG/5ML suspension 1/4 tsp daily 118 mL 5   OVER THE COUNTER MEDICATION 2 (two) times daily. Preservision vitamin for eyes     potassium chloride (KLOR-CON M) 10 MEQ tablet 1 tablet (10 meq) every Tuesday and Friday. 10 tablet 11   torsemide (DEMADEX) 5 MG tablet Take 5 mg by mouth daily.     triamcinolone (NASACORT) 55 MCG/ACT AERO nasal inhaler Place 1 spray into the nose daily. 1 each 5   VITAMIN A PO Take by mouth daily.     vitamin E 400 UNIT  capsule Take 1 capsule by mouth daily.     No current facility-administered medications for this encounter.    Physical Exam: BP 110/64   Pulse (!) 112   Ht 5\' 1"  (1.549 m)   Wt 49.7 kg   BMI 20.71 kg/m   GEN: Well nourished, well developed in no acute distress NECK: No JVD; No carotid bruits CARDIAC: Irregularly irregular rate and rhythm, no murmurs, rubs, gallops RESPIRATORY:  Clear to auscultation without rales, wheezing or rhonchi  ABDOMEN: Soft, non-tender, non-distended EXTREMITIES:  No edema; No deformity   Wt Readings from Last 3 Encounters:  02/02/23 49.7 kg  01/21/23 48.4 kg  01/18/23 49.2 kg     EKG today demonstrates  Afib, PVC Vent. rate 112 BPM PR interval * ms QRS duration 76 ms QT/QTcB 342/466 ms  Echo 12/31/22 demonstrated  1. Left ventricular ejection fraction, by estimation, is 55 to 60%. The  left ventricle has normal function. The left ventricle has no regional  wall motion abnormalities. Left ventricular diastolic parameters are  indeterminate.   2. Right ventricular systolic function is normal. The right ventricular  size is normal. There is mildly elevated pulmonary artery systolic  pressure.   3. Left atrial size was moderately dilated.   4. Right atrial size was severely dilated.   5. The mitral valve is normal in structure. Mild to moderate mitral valve  regurgitation. No evidence of mitral stenosis.   6. Tricuspid valve regurgitation is severe.   7. The aortic valve is normal in structure. Aortic valve regurgitation is  not visualized. No aortic stenosis is present.   8. The inferior vena cava is dilated in size with <50% respiratory  variability, suggesting right atrial pressure of 15 mmHg.    CHA2DS2-VASc Score = 3  The patient's score is based upon: CHF History: 0 HTN History: 0 Diabetes History: 0 Stroke History: 0 Vascular Disease History: 0 Age Score: 2 Gender Score: 1       ASSESSMENT AND PLAN: Persistent Atrial  Fibrillation (ICD10:  I48.19) The patient's CHA2DS2-VASc score is 3, indicating a 3.2% annual risk of stroke.   General education about afib provided and questions answered. We also discussed her stroke risk and the risks and benefits of anticoagulation. At this point, would continue off anticoagulation given her previous reactions. Both her and her family understand her stroke risk.  Her cardiac monitor showed avg HR 90's. Will not add further rate control at this time. Conservative management may be the best strategy.   Secondary Hypercoagulable State (ICD10:  D68.69) The patient is at significant risk for stroke/thromboembolism based upon her CHA2DS2-VASc Score of 3.  However, the patient is not on anticoagulation due to her previous adverse reactions.  Follow up with Dr Mallory Shirk office per recall. AF clinic as needed.      I spent a total of 58 minutes caring for this patient today (02/02/23) including reviewing her recent cardiology encounters and counseling the patient on atrial fibrillation and stroke prevention in a face to face encounter.    Jorja Loa [name] [facility] [address] [place], [place] [address] [phone]

## 2023-02-04 NOTE — Telephone Encounter (Signed)
Left voicemail to return call to office.

## 2023-02-04 NOTE — Telephone Encounter (Signed)
Patient states she saw doctor on the 24 th and was taken care of.  She was not taking potassium as ordered so she is currently taking.  She has stopped the Pradaxa. She states not getting her OV note and will get next time she comes by the office.

## 2023-02-04 NOTE — Telephone Encounter (Signed)
Patient is returning call and is requesting return call.

## 2023-02-08 ENCOUNTER — Telehealth: Payer: Self-pay | Admitting: Cardiology

## 2023-02-08 NOTE — Telephone Encounter (Signed)
Patient stated she has coronavirus and has been coughing a lot.  Patient wants to know if she can take 1 tsp of Robitussin or advise on which medication she can take for her bad cough.

## 2023-02-08 NOTE — Telephone Encounter (Signed)
Patient states her son has been sick as he returned from [place].  She states she started with a sore throat over the weekend which has resolved.  She has no runny nose but today has a cough. She ask if she can take robitussin CF. Advise plain Robitussin. She states no BP issues at this time but she will get the plain Robitussin. She has not taken a Covid test.  She states he took a Covid test and was negative and stated it was a cold Not Covid. She only has a cough at this time.  She will contact her PCP if she worsens

## 2023-02-11 ENCOUNTER — Encounter (HOSPITAL_COMMUNITY): Payer: Medicare PPO

## 2023-02-24 ENCOUNTER — Encounter: Payer: Self-pay | Admitting: Family Medicine

## 2023-02-24 ENCOUNTER — Ambulatory Visit: Payer: Medicare PPO | Admitting: Family Medicine

## 2023-02-24 VITALS — BP 100/60 | HR 92 | Temp 98.0°F | Wt 111.2 lb

## 2023-02-24 DIAGNOSIS — E039 Hypothyroidism, unspecified: Secondary | ICD-10-CM | POA: Diagnosis not present

## 2023-02-24 NOTE — Progress Notes (Signed)
   Subjective:    Patient ID: Heidi Moore, female    DOB: 01-Feb-1920, 89 y.o.   MRN: [id number]  HPI Here with her daughter to discuss recent lab results. She was seen at [facility] on 01-11-23 and she had lab work done. These were remarkable only for a high TSH of 5.837 and a low free T4 of 0.85. She had been treated for hypothyroidism in the past but for some reason she stopped taking the Levothyroxine. She is being seen by Dr. Thomasene Ripple in Cardiology for atrial fibrillation. She also has chronic leg edema.    Review of Systems  Constitutional:  Positive for fatigue.  Respiratory:  Positive for cough, shortness of breath and wheezing.   Cardiovascular:  Positive for leg swelling. Negative for chest pain and palpitations.       Objective:   Physical Exam Constitutional:      Appearance: Normal appearance.     Comments: Walks with a cane   Cardiovascular:     Rate and Rhythm: Tachycardia present. Rhythm irregular.     Pulses: Normal pulses.     Heart sounds: Normal heart sounds.  Pulmonary:     Effort: Pulmonary effort is normal.     Breath sounds: Normal breath sounds.  Musculoskeletal:     Comments: 3+ edema in both lower legs   Neurological:     Mental Status: She is alert.           Assessment & Plan:  She definitely has hypothyroidism. At her request we will recheck this today with a TSH, free T4, and free T3. Pending these results, we plan to resume treatment of this problem . Gershon Crane, MD

## 2023-02-25 LAB — T3, FREE: T3, Free: 2.6 pg/mL (ref 2.3–4.2)

## 2023-02-25 LAB — TSH: TSH: 5.56 u[IU]/mL — ABNORMAL HIGH (ref 0.35–5.50)

## 2023-02-25 LAB — T4, FREE: Free T4: 0.65 ng/dL (ref 0.60–1.60)

## 2023-03-02 ENCOUNTER — Telehealth: Payer: Self-pay | Admitting: Cardiology

## 2023-03-02 MED ORDER — POTASSIUM CHLORIDE CRYS ER 10 MEQ PO TBCR: EXTENDED_RELEASE_TABLET | ORAL | 3 refills | Status: DC

## 2023-03-02 MED ORDER — TORSEMIDE 5 MG PO TABS: 5.0000 mg | ORAL_TABLET | Freq: Every day | ORAL | 3 refills | Status: DC

## 2023-03-02 NOTE — Telephone Encounter (Signed)
Pt states her legs have been swelling. She states she was taking her fluid pill but not the potassium. She states she also had a little bit of cramping. She has ran out of both meds now, so a message was sent to refill dept to send it in for her. Please advise.

## 2023-03-02 NOTE — Telephone Encounter (Signed)
*  STAT* If patient is at the pharmacy, call can be transferred to refill team.   1. Which medications need to be refilled? (please list name of each medication and dose if known)    potassium chloride (KLOR-CON M) 10 MEQ tablet 1 tablet (10 meq) every Tuesday and Friday.   torsemide (DEMADEX) 5 MG tablet Take 5 mg by mouth daily.      4. Which pharmacy/location (including street and city if local pharmacy) is medication to be sent to? [id number] - [place], [place] - [address]     5. Do they need a 30 day or 90 day supply? 90

## 2023-03-02 NOTE — Telephone Encounter (Signed)
Spoke to patient she stated she is having swelling in both lower legs.Swelling worse last night.No sob.She wears compression stockings.Appointment scheduled with Dr.Tobb 10/23 at 3:20 pm.Advised to bring all medications to appointment.

## 2023-03-02 NOTE — Telephone Encounter (Signed)
Refills has been sent to the pharmacy. 

## 2023-03-03 ENCOUNTER — Encounter: Payer: Self-pay | Admitting: Cardiology

## 2023-03-03 ENCOUNTER — Ambulatory Visit: Payer: Medicare PPO | Attending: Cardiology | Admitting: Cardiology

## 2023-03-03 VITALS — BP 100/72 | HR 71 | Ht 61.0 in | Wt 112.6 lb

## 2023-03-03 DIAGNOSIS — I5032 Chronic diastolic (congestive) heart failure: Secondary | ICD-10-CM | POA: Diagnosis not present

## 2023-03-03 DIAGNOSIS — I83893 Varicose veins of bilateral lower extremities with other complications: Secondary | ICD-10-CM | POA: Diagnosis not present

## 2023-03-03 DIAGNOSIS — I480 Paroxysmal atrial fibrillation: Secondary | ICD-10-CM | POA: Diagnosis not present

## 2023-03-03 DIAGNOSIS — Z79899 Other long term (current) drug therapy: Secondary | ICD-10-CM | POA: Diagnosis not present

## 2023-03-03 MED ORDER — TORSEMIDE 5 MG PO TABS: 5.0000 mg | ORAL_TABLET | ORAL | 3 refills | Status: DC

## 2023-03-03 NOTE — Patient Instructions (Signed)
Medication Instructions:  Your physician has recommended you make the following change in your medication:  CHANGE: Torsemide 5 mg (one tablet) on Tuesday and Friday *If you need a refill on your cardiac medications before your next appointment, please call your pharmacy*   Lab Work: CMET, Mag If you have labs (blood work) drawn today and your tests are completely normal, you will receive your results only by: MyChart Message (if you have MyChart) OR A paper copy in the mail If you have any lab test that is abnormal or we need to change your treatment, we will call you to review the results.   Testing/Procedures: None   Follow-Up: At [facility], you and your health needs are our priority.  As part of our continuing mission to provide you with exceptional heart care, we have created designated Provider Care Teams.  These Care Teams include your primary Cardiologist (physician) and Advanced Practice Providers (APPs -  Physician Assistants and Nurse Practitioners) who all work together to provide you with the care you need, when you need it.  Your next appointment:   12 week(s)  Provider:   Thomasene Ripple, DO

## 2023-03-03 NOTE — Progress Notes (Signed)
Cardiology Office Note:    Date:  03/03/2023   ID:  MERIDIAN SOU, DOB February 23, 1920, MRN [id number]  PCP:  Karie Georges, MD  Cardiologist:  Thomasene Ripple, DO  Electrophysiologist:  Lanier Prude, MD   Referring MD: Karie Georges, MD     History of Present Illness:    Heidi Moore is a 89 y.o. female with a hx of atrial fibrillation on Xarelto, hyperlipidemia, mild to moderate mitral regurgitation on recent echocardiogram, mild pulmonary hypertension, here today to for a follow up visit.   She reports that her legs were "double the size" the night before the appointment. The swelling is accompanied by a sensation of tightness and discomfort. She also mentions a history of varicose veins. Despite regular exercise, including gym workouts and treadmill sessions, the swelling persists. She describes the sensation as if her "intestines are falling down."  The patient is currently on torsemide, a diuretic, which she has been taking daily since it was prescribed. However, she reports that her legs continue to swell, and she is considering adjusting the dosage frequency. She also mentions a history of thyroid issues, which she believes may be contributing to her irregular heartbeat. However, recent blood tests indicate that her thyroid levels are within normal range.  The patient also reports a history of varicose veins, which she believes may be contributing to her current symptoms. She mentions previous treatments to "dry up" the veins, but notes that her symptoms have persisted. She wears compression stockings daily and exercises regularly, but these measures have not alleviated her symptoms.  Past Medical History:  Diagnosis Date   AC (acromioclavicular) joint bone spurs    on right shoulder    Allergic rhinitis    Arthritis    Bursitis    right shoulder   Complication of anesthesia    headaches   Diverticulosis    DIVERTICULOSIS, COLON 10/15/2006   Qualifier: Diagnosis of   By: Alwyn Ren MD, Deborra Medina problem    treated by Dr Alben Spittle per patient   [id number](440.3)    sinus HA   History of blood transfusion 66yrs ago   no abnormal reaction noted   History of cystitis    History of skin cancer    Basal cell, Dr. Leta Speller   Hyperlipidemia    [place] Study LDL goal =<160    Hypotension    Joint pain    Joint swelling    Macular degeneration, dry    Osteopenia    Intolerance to Calcium,Actonel,Fosamax,Evista, Forteo: S/P Boniva 2007 to 03/2010   OSTEOPENIA 03/12/2008   Qualifier: Diagnosis of  By: Freddy Jaksch     Osteoporosis    PONV (postoperative nausea and vomiting)    Rosacea 04/22/2009   Qualifier: Diagnosis of  By: Alwyn Ren MD, William     Skin cancer 06/22/2017   per patient after excision right calf by Dr Nicholas Lose   SKIN CANCER, HX OF 03/12/2008   Qualifier: Diagnosis of  By: Freddy Jaksch     Urinary frequency    Vaginal atrophy    sees Dr. Huntley Dec    Past Surgical History:  Procedure Laterality Date   APPENDECTOMY     BUNIONECTOMY Bilateral    CATARACT EXTRACTION, BILATERAL     COLONOSCOPY     with Tics (no further f/u as per GI due to age)    JOINT REPLACEMENT Left    knee    KNEE ARTHROPLASTY  05/18/2012   Procedure:  COMPUTER ASSISTED TOTAL KNEE ARTHROPLASTY;  Surgeon: Eldred Manges, MD;  Location: [facility] OR;  Service: Orthopedics;  Laterality: Left;  Left Total Knee Arthroplasty, Cemented, Computer Assisted   lt knee      meniscus tear   MOHS SURGERY     right nostril for basel cell cancer    NOSE SURGERY     due to trama at age 25    TOTAL ABDOMINAL HYSTERECTOMY W/ BILATERAL SALPINGOOPHORECTOMY     for Endometriosis    TOTAL SHOULDER ARTHROPLASTY Right 09/11/2013   Procedure: TOTAL SHOULDER ARTHROPLASTY;  Surgeon: Eldred Manges, MD;  Location: [facility] OR;  Service: Orthopedics;  Laterality: Right;  Right Total Shoulder Arthroplasty    Current Medications: Current Meds  Medication Sig   acetaminophen (TYLENOL) 325 MG tablet  Take 650 mg by mouth every 6 (six) hours as needed for mild pain or headache.   Ascorbic Acid (VITAMIN C PO) Take 500 mg by mouth 2 (two) times daily.   cholecalciferol (VITAMIN D) 1000 UNITS tablet Take 1,000 Units by mouth 2 (two) times daily.    Cyanocobalamin (VITAMIN B 12 PO) Take by mouth daily.   estradiol (ESTRACE) 0.5 MG tablet Take 0.5 mg by mouth daily.   OVER THE COUNTER MEDICATION 2 (two) times daily. Preservision vitamin for eyes   potassium chloride (KLOR-CON M) 10 MEQ tablet 1 tablet (10 meq) every Tuesday and Friday.   [START ON 03/04/2023] torsemide (DEMADEX) 5 MG tablet Take 1 tablet (5 mg total) by mouth 2 (two) times a week. Tuesday and Friday.   VITAMIN A PO Take by mouth daily.   vitamin E 400 UNIT capsule Take 1 capsule by mouth daily.   [DISCONTINUED] torsemide (DEMADEX) 5 MG tablet Take 1 tablet (5 mg total) by mouth daily.     Allergies:   Alendronate sodium, Anaplex hd, Aspirin, Azithromycin, Calcium, Ciprofloxacin, Codeine, Dorzolamide hcl-timolol mal, Doxycycline, Eliquis [apixaban], Gabapentin, Hydrocodone, Ibandronate sodium, Ibuprofen, Penicillins, Pradaxa [dabigatran etexilate mesylate], Raloxifene, Risedronate sodium, Sulfonamide derivatives, Teriparatide, Travoprost, and Xarelto [rivaroxaban]   Social History   Socioeconomic History   Marital status: Widowed    Spouse name: Not on file   Number of children: 3   Years of education: Not on file   Highest education level: Some college, no degree  Occupational History    Employer: RETIRED  Tobacco Use   Smoking status: Never    Passive exposure: Never   Smokeless tobacco: Never   Tobacco comments:    Never smoked 02/02/23  Vaping Use   Vaping status: Never Used  Substance and Sexual Activity   Alcohol use: No   Drug use: No   Sexual activity: Not Currently    Birth control/protection: Surgical  Other Topics Concern   Not on file  Social History Narrative   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 She lives alone. She goes to the gym and participates in aerobics 3 x week. She drinks 3 cups of coffee a day.  Social Determinants of Health   Financial Resource Strain: Low Risk  (02/22/2023)   Overall Financial Resource Strain (CARDIA)    Difficulty of Paying Living Expenses: Not hard at all  Food Insecurity: No Food Insecurity (02/22/2023)   Hunger Vital Sign    Worried About Running Out of Food in the Last Year: Never true    Ran Out of Food in the Last Year: Never true  Transportation Needs: No Transportation Needs (02/22/2023)   PRAPARE - Administrator, Civil Service (Medical): No    Lack of Transportation (Non-Medical): No  Physical Activity: Insufficiently Active (02/22/2023)   Exercise Vital Sign    Days of Exercise per Week: 2 days    Minutes of Exercise per Session: 40 min  Stress: No Stress Concern Present (02/22/2023)   [employer] of Occupational Health - Occupational Stress Questionnaire    Feeling of Stress : Not at all  Social Connections: Moderately Isolated (02/22/2023)   Social Connection and Isolation Panel [NHANES]    Frequency of Communication with Friends and Family: More than three times a week    Frequency of Social Gatherings with Friends and Family: More than three times a week    Attends Religious Services: Never    Database administrator or Organizations: Yes    Attends Engineer, structural: More than 4 times per year    Marital Status: Widowed     Family History: The patient's family  history includes Allergic rhinitis in her daughter and son; Atrial fibrillation in her brother; Colon cancer in an other family member; Diabetes in her brother; Lung cancer in her sister; Stroke (age of onset: 21) in her mother.  ROS:   Review of Systems  Constitution: Negative for decreased appetite, fever and weight gain.  HENT: Negative for congestion, ear discharge, hoarse voice and sore throat.   Eyes: Negative for discharge, redness, vision loss in right eye and visual halos.  Cardiovascular: Negative for chest pain, dyspnea on exertion, leg swelling, orthopnea and palpitations.  Respiratory: Negative for cough, hemoptysis, shortness of breath and snoring.   Endocrine: Negative for heat intolerance and polyphagia.  Hematologic/Lymphatic: Negative for bleeding problem. Does not bruise/bleed easily.  Skin: Negative for flushing, nail changes, rash and suspicious lesions.  Musculoskeletal: Negative for arthritis, joint pain, muscle cramps, myalgias, neck pain and stiffness.  Gastrointestinal: Negative for abdominal pain, bowel incontinence, diarrhea and excessive appetite.  Genitourinary: Negative for decreased libido, genital sores and incomplete emptying.  Neurological: Negative for brief paralysis, focal weakness, headaches and loss of balance.  Psychiatric/Behavioral: Negative for altered mental status, depression and suicidal ideas.  Allergic/Immunologic: Negative for HIV exposure and persistent infections.    EKGs/Labs/Other Studies Reviewed:    The following studies were reviewed today:   EKG:  The ekg ordered today demonstrates   Recent Labs: 12/31/2022: Hemoglobin 11.6; Platelets 165 01/18/2023: BUN 13; Creatinine, Ser 0.98; Potassium 4.8; Sodium 140 02/24/2023: TSH 5.56  Recent Lipid Panel    Component Value Date/Time   CHOL 157 10/31/2021 1145   TRIG 63.0 10/31/2021 1145   HDL 66.30 10/31/2021 1145   CHOLHDL 2 10/31/2021 1145   VLDL 12.6 10/31/2021 1145   LDLCALC 78  10/31/2021 1145    Physical Exam:    VS:  BP 100/72 (BP Location: Left Arm, Patient Position: Sitting, Cuff Size: Normal)   Pulse 71   Ht 5\' 1"  (1.549 m)   Wt 112 lb 9.6 oz (51.1 kg)   SpO2 97%  BMI 21.28 kg/m     Wt Readings from Last 3 Encounters:  03/03/23 112 lb 9.6 oz (51.1 kg)  02/24/23 111 lb 3.2 oz (50.4 kg)  02/02/23 109 lb 9.6 oz (49.7 kg)     GEN: Well nourished, well developed in no acute distress HEENT: Normal NECK: No JVD; No carotid bruits LYMPHATICS: No lymphadenopathy CARDIAC: S1S2 noted,RRR, no murmurs, rubs, gallops RESPIRATORY:  Clear to auscultation without rales, wheezing or rhonchi  ABDOMEN: Soft, non-tender, non-distended, +bowel sounds, no guarding. EXTREMITIES: No edema, No cyanosis, no clubbing MUSCULOSKELETAL:  No deformity  SKIN: Warm and dry NEUROLOGIC:  Alert and oriented x 3, non-focal PSYCHIATRIC:  Normal affect, good insight  ASSESSMENT:    1. Medication management   2. PAF (paroxysmal atrial fibrillation) (HCC)   3. Chronic diastolic heart failure (HCC)   4. Varicose veins of both legs with edema     PLAN:    Lower Extremity Edema Severe bilateral lower extremity edema, likely secondary to venous insufficiency. Patient reports discomfort and tightness. Currently on Torsemide 5mg  daily, but experiencing dry skin and possible dehydration. -Reduce Torsemide to twice weekly (Tuesday and Friday). -Order blood work to check potassium levels and adjust dosage if necessary. -Encourage continued use of compression stockings and regular exercise.  Paroxysmal atrial fibrillation-discussion with the patient she prefers not to be on any anticoagulation at this time.  Risk and benefit discussed.  Follow-up in 10-12 weeks to reassess edema and response to adjusted Torsemide dosage.  The patient is in agreement with the above plan. The patient left the office in stable condition.  The patient will follow up in   Medication Adjustments/Labs  and Tests Ordered: Current medicines are reviewed at length with the patient today.  Concerns regarding medicines are outlined above.  Orders Placed This Encounter  Procedures   Comprehensive Metabolic Panel (CMET)   Magnesium   Meds ordered this encounter  Medications   torsemide (DEMADEX) 5 MG tablet    Sig: Take 1 tablet (5 mg total) by mouth 2 (two) times a week. Tuesday and Friday.    Dispense:  26 tablet    Refill:  3    Patient Instructions  Medication Instructions:  Your physician has recommended you make the following change in your medication:  CHANGE: Torsemide 5 mg (one tablet) on Tuesday and Friday *If you need a refill on your cardiac medications before your next appointment, please call your pharmacy*   Lab Work: CMET, Mag If you have labs (blood work) drawn today and your tests are completely normal, you will receive your results only by: MyChart Message (if you have MyChart) OR A paper copy in the mail If you have any lab test that is abnormal or we need to change your treatment, we will call you to review the results.   Testing/Procedures: None   Follow-Up: At [facility], you and your health needs are our priority.  As part of our continuing mission to provide you with exceptional heart care, we have created designated Provider Care Teams.  These Care Teams include your primary Cardiologist (physician) and Advanced Practice Providers (APPs -  Physician Assistants and Nurse Practitioners) who all work together to provide you with the care you need, when you need it.  Your next appointment:   12 week(s)  Provider:   Thomasene Ripple, DO    Adopting a Healthy Lifestyle.  Know what a healthy weight is for you (roughly BMI <25) and aim to maintain this  Aim for 7+ servings of fruits and vegetables daily   65-80+ fluid ounces of water or unsweet tea for healthy kidneys   Limit to max 1 drink of alcohol per day; avoid smoking/tobacco   Limit  animal fats in diet for cholesterol and heart health - choose grass fed whenever available   Avoid highly processed foods, and foods high in saturated/trans fats   Aim for low stress - take time to unwind and care for your mental health   Aim for 150 min of moderate intensity exercise weekly for heart health, and weights twice weekly for bone health   Aim for 7-9 hours of sleep daily   When it comes to diets, agreement about the perfect plan isnt easy to find, even among the experts. Experts at the [facility] [employer] developed an idea known as the Healthy Eating Plate. Just imagine a plate divided into logical, healthy portions.   The emphasis is on diet quality:   Load up on vegetables and fruits - one-half of your plate: Aim for color and variety, and remember that potatoes dont count.   Go for whole grains - one-quarter of your plate: Whole wheat, barley, wheat berries, quinoa, oats, brown rice, and foods made with them. If you want pasta, go with whole wheat pasta.   Protein power - one-quarter of your plate: Fish, chicken, beans, and nuts are all healthy, versatile protein sources. Limit red meat.   The diet, however, does go beyond the plate, offering a few other suggestions.   Use healthy plant oils, such as olive, canola, soy, corn, sunflower and peanut. Check the labels, and avoid partially hydrogenated oil, which have unhealthy trans fats.   If youre thirsty, drink water. Coffee and tea are good in moderation, but skip sugary drinks and limit milk and dairy products to one or two daily servings.   The type of carbohydrate in the diet is more important than the amount. Some sources of carbohydrates, such as vegetables, fruits, whole grains, and beans-are healthier than others.   Finally, stay active  Signed, Thomasene Ripple, DO  03/03/2023 5:12 PM    [facility]

## 2023-03-04 ENCOUNTER — Telehealth: Payer: Self-pay | Admitting: Family Medicine

## 2023-03-04 LAB — COMPREHENSIVE METABOLIC PANEL
ALT: 19 [IU]/L (ref 0–32)
AST: 25 [IU]/L (ref 0–40)
Albumin: 4 g/dL (ref 3.6–4.6)
Alkaline Phosphatase: 72 [IU]/L (ref 44–121)
BUN/Creatinine Ratio: 16 (ref 12–28)
BUN: 16 mg/dL (ref 10–36)
Bilirubin Total: 0.5 mg/dL (ref 0.0–1.2)
CO2: 22 mmol/L (ref 20–29)
Calcium: 9.4 mg/dL (ref 8.7–10.3)
Chloride: 103 mmol/L (ref 96–106)
Creatinine, Ser: 1.01 mg/dL — ABNORMAL HIGH (ref 0.57–1.00)
Globulin, Total: 2.4 g/dL (ref 1.5–4.5)
Glucose: 81 mg/dL (ref 70–99)
Potassium: 4.2 mmol/L (ref 3.5–5.2)
Sodium: 140 mmol/L (ref 134–144)
Total Protein: 6.4 g/dL (ref 6.0–8.5)
eGFR: 50 mL/min/{1.73_m2} — ABNORMAL LOW (ref >59)

## 2023-03-04 LAB — MAGNESIUM: Magnesium: 2.1 mg/dL (ref 1.6–2.3)

## 2023-03-04 MED ORDER — LEVOTHYROXINE SODIUM 50 MCG PO TABS: 50.0000 ug | ORAL_TABLET | Freq: Every day | ORAL | 2 refills | Status: DC

## 2023-03-04 NOTE — Telephone Encounter (Signed)
Patient called returning Joanne's call and is inquiring on her results of:  Thyroid test  Message with the results and if she needs to be on medication for this can be left on voice mail, patient will be away from home after 1:30 pm.  Please advise at [phone].

## 2023-03-04 NOTE — Addendum Note (Signed)
Addended by: Johnella Moloney on: 03/04/2023 04:49 PM   Modules accepted: Orders

## 2023-03-04 NOTE — Telephone Encounter (Signed)
See results note. 

## 2023-03-08 ENCOUNTER — Telehealth: Payer: Self-pay | Admitting: Family Medicine

## 2023-03-08 NOTE — Telephone Encounter (Signed)
Saw Fry Pt feels she is having a reaction to levothyroxine (SYNTHROID) 50 MCG tablet, says legs are swelling.took a fluid pill and it helped some

## 2023-03-08 NOTE — Telephone Encounter (Signed)
I'm glad the swelling is better. Please assure her that it is impossible for her Levothyroxine to cause this

## 2023-03-09 NOTE — Telephone Encounter (Signed)
Attempted to contact pt no way of leaving a message for pt will try again later

## 2023-03-10 ENCOUNTER — Ambulatory Visit: Payer: Medicare PPO | Admitting: Cardiology

## 2023-03-10 NOTE — Telephone Encounter (Signed)
Patient returned call

## 2023-03-11 NOTE — Telephone Encounter (Signed)
Spoke with pt states that she is having blur vision from taking the Levothyroxine, pt states that she called her pharmacy and they told her that this is a side effect. Pt states that she has been taking 25 mg and not 50 mg. Pt wants to know if she can continue taking the 25 mg. Please advise

## 2023-03-11 NOTE — Telephone Encounter (Signed)
Left another message for pt advised to call the office back

## 2023-03-11 NOTE — Telephone Encounter (Signed)
Spoke with pt advised of Dr Fry recommendation, verbalized understanding 

## 2023-03-11 NOTE — Telephone Encounter (Signed)
Well I can guarantee the Levothyroxine is not causing blurred vision either. Iin any event, yes she can keep taking 25 mcg daily

## 2023-03-15 ENCOUNTER — Telehealth: Payer: Self-pay | Admitting: Cardiology

## 2023-03-15 NOTE — Telephone Encounter (Signed)
Patient stated she was calling to let Dr. Servando Salina  know she started taking her potassium and lasix to Mon-Wed-Friday. She also her GYN and he took her off her hormone medication.

## 2023-03-15 NOTE — Telephone Encounter (Signed)
Pt states that she was told to let the doctor know when she changes her schedule with her fluid pill and potassium medication. Please advise

## 2023-03-15 NOTE — Telephone Encounter (Signed)
Pt returning call

## 2023-03-15 NOTE — Telephone Encounter (Signed)
Called patient with no answer. Left message to return call.

## 2023-03-17 ENCOUNTER — Telehealth: Payer: Self-pay | Admitting: Family Medicine

## 2023-03-17 NOTE — Telephone Encounter (Signed)
Pt is calling and would like blood test to check her tsh level. PT stopped taking 25 mg levothyroxine (SYNTHROID) 50 MCG tablet  and her legs are swelling. Pt did reach out to dr tobb her cardiologist causing leg swelling. Pt thinks the leg swelling coming from tsh med and would like to come in for tsh blood work. Pt would like nancy to return her call

## 2023-03-17 NOTE — Telephone Encounter (Signed)
Is it ok to send this message to Dr Casimiro Needle, please advise

## 2023-03-22 ENCOUNTER — Emergency Department (HOSPITAL_BASED_OUTPATIENT_CLINIC_OR_DEPARTMENT_OTHER): Payer: Medicare PPO

## 2023-03-22 ENCOUNTER — Emergency Department (HOSPITAL_BASED_OUTPATIENT_CLINIC_OR_DEPARTMENT_OTHER)
Admission: EM | Admit: 2023-03-22 | Discharge: 2023-03-22 | Disposition: A | Discharge status: Home / Self Care | Payer: Medicare PPO | Attending: Emergency Medicine | Admitting: Emergency Medicine

## 2023-03-22 ENCOUNTER — Encounter (HOSPITAL_BASED_OUTPATIENT_CLINIC_OR_DEPARTMENT_OTHER): Payer: Self-pay

## 2023-03-22 ENCOUNTER — Telehealth: Payer: Self-pay | Admitting: Family Medicine

## 2023-03-22 ENCOUNTER — Other Ambulatory Visit: Payer: Self-pay

## 2023-03-22 ENCOUNTER — Telehealth: Payer: Self-pay | Admitting: Cardiology

## 2023-03-22 DIAGNOSIS — R600 Localized edema: Secondary | ICD-10-CM | POA: Diagnosis present

## 2023-03-22 DIAGNOSIS — R000 Tachycardia, unspecified: Secondary | ICD-10-CM | POA: Insufficient documentation

## 2023-03-22 DIAGNOSIS — I480 Paroxysmal atrial fibrillation: Secondary | ICD-10-CM

## 2023-03-22 LAB — URINALYSIS, W/ REFLEX TO CULTURE (INFECTION SUSPECTED)
Bilirubin Urine: NEGATIVE
Glucose, UA: NEGATIVE mg/dL
Hgb urine dipstick: NEGATIVE
Ketones, ur: NEGATIVE mg/dL
Nitrite: NEGATIVE
Protein, ur: NEGATIVE mg/dL
Specific Gravity, Urine: 1.005 (ref 1.005–1.030)
pH: 5 (ref 5.0–8.0)

## 2023-03-22 LAB — COMPREHENSIVE METABOLIC PANEL
ALT: 19 U/L (ref 0–44)
AST: 22 U/L (ref 15–41)
Albumin: 4.3 g/dL (ref 3.5–5.0)
Alkaline Phosphatase: 63 U/L (ref 38–126)
Anion gap: 5 (ref 5–15)
BUN: 12 mg/dL (ref 8–23)
CO2: 28 mmol/L (ref 22–32)
Calcium: 9.7 mg/dL (ref 8.9–10.3)
Chloride: 104 mmol/L (ref 98–111)
Creatinine, Ser: 0.97 mg/dL (ref 0.44–1.00)
GFR, Estimated: 52 mL/min — ABNORMAL LOW (ref >60)
Glucose, Bld: 111 mg/dL — ABNORMAL HIGH (ref 70–99)
Potassium: 3.6 mmol/L (ref 3.5–5.1)
Sodium: 137 mmol/L (ref 135–145)
Total Bilirubin: 0.6 mg/dL (ref <1.2)
Total Protein: 7.2 g/dL (ref 6.5–8.1)

## 2023-03-22 LAB — CBC WITH DIFFERENTIAL/PLATELET
Abs Immature Granulocytes: 0.03 10*3/uL (ref 0.00–0.07)
Basophils Absolute: 0 10*3/uL (ref 0.0–0.1)
Basophils Relative: 1 %
Eosinophils Absolute: 0 10*3/uL (ref 0.0–0.5)
Eosinophils Relative: 1 %
HCT: 36.1 % (ref 36.0–46.0)
Hemoglobin: 12 g/dL (ref 12.0–15.0)
Immature Granulocytes: 1 %
Lymphocytes Relative: 18 %
Lymphs Abs: 1.2 10*3/uL (ref 0.7–4.0)
MCH: 29.7 pg (ref 26.0–34.0)
MCHC: 33.2 g/dL (ref 30.0–36.0)
MCV: 89.4 fL (ref 80.0–100.0)
Monocytes Absolute: 0.6 10*3/uL (ref 0.1–1.0)
Monocytes Relative: 9 %
Neutro Abs: 4.6 10*3/uL (ref 1.7–7.7)
Neutrophils Relative %: 70 %
Platelets: 176 10*3/uL (ref 150–400)
RBC: 4.04 MIL/uL (ref 3.87–5.11)
RDW: 15.4 % (ref 11.5–15.5)
WBC: 6.5 10*3/uL (ref 4.0–10.5)
nRBC: 0 % (ref 0.0–0.2)

## 2023-03-22 LAB — BRAIN NATRIURETIC PEPTIDE: B Natriuretic Peptide: 371.8 pg/mL — ABNORMAL HIGH (ref 0.0–100.0)

## 2023-03-22 NOTE — Telephone Encounter (Signed)
Triage nurse Nash Dimmer called stating patient is refusing to go to ED, patient daughter will be taking her to Urgent Care. Patient is on thyroid pill, and is unable to take this, has dizziness and blurry vision, leg swelling, patient was told to go to the ED by the doctor and is refusing, patient will be going to Urgent Care.

## 2023-03-22 NOTE — Discharge Instructions (Addendum)
I would like to take 10 mg of torsemide today.  You can take 5 g of torsemide each day until Friday.  Please call your doctor this week.  Please continue taking all other medications as prescribed.  Come back to the emergency department if you develop difficulty with your breathing.

## 2023-03-22 NOTE — Telephone Encounter (Signed)
Patient identification verified by 2 forms. Heidi Rail, RN   Called and spoke to patient  Patient states:   -was started on levothyroxine by Dr. Clent Ridges   -she has a lot of blurred vision and dizziness right now, has difficulty reading medication labels   -swelling started after taking levo thyroxine   -swelling worsened last night   -Takes torsemide Monday, Tuesday, Friday   -she is not urinating a lot, how often throughout the day  -she last took the levothyroxine yesterday   -symptoms are related to levothyroxine Rx Patient denies:   -SOB/difficulty breathing  Informed patient per Dr. Servando Salina, best to present to ER today for evaluation Patient agrees, states she will present to urgent care  Patient has no further questions at this time

## 2023-03-22 NOTE — ED Provider Notes (Signed)
[place] EMERGENCY DEPARTMENT AT [facility] Provider Note   CSN: [id number] Arrival date & time: 03/22/23  1401     History  Chief Complaint  Patient presents with   Leg Swelling    Heidi Moore is a 89 y.o. female.  Patient complains of swelling in bilateral lower legs.  Patient reports that her doctor recently changed her medication.  Patient reports that she has not had any change in her activities.  Patient denies any change in her diet.  Patient reports she called her cardiologist and was advised to come in for evaluation.  Patient denies any fevers or chills she denies any cough.  She is not having any shortness of breath.  Patient reports that she exercises daily and goes to the gym 2 days a week.  Patient reports that when she goes to sleep the swelling in her legs goes down however after she is up and moves around she has return of the swelling.  Patient is here with her daughter who is supportive.        Home Medications Prior to Admission medications   Medication Sig Start Date End Date Taking? Authorizing Provider  acetaminophen (TYLENOL) 325 MG tablet Take 650 mg by mouth every 6 (six) hours as needed for mild pain or headache.    [provider]  acetic acid-hydrocortisone (VOSOL-HC) OTIC solution Place 3 drops into both ears 2 (two) times daily. Patient not taking: Reported on 03/03/2023 05/25/22   Karie Georges, MD  Ascorbic Acid (VITAMIN C PO) Take 500 mg by mouth 2 (two) times daily.    [provider]  cholecalciferol (VITAMIN D) 1000 UNITS tablet Take 1,000 Units by mouth 2 (two) times daily.     [provider]  Cyanocobalamin (VITAMIN B 12 PO) Take by mouth daily.    [provider]  estradiol (ESTRACE) 0.5 MG tablet Take 0.5 mg by mouth daily.    [provider]  fexofenadine [facility] ALLERGY CHILDRENS) 30 MG/5ML suspension 1/4 tsp daily Patient not taking: Reported on 03/03/2023 08/20/21    Marcelyn Bruins, MD  levothyroxine (SYNTHROID) 50 MCG tablet Take 1 tablet (50 mcg total) by mouth daily before breakfast. 03/04/23   Nelwyn Salisbury, MD  OVER THE COUNTER MEDICATION 2 (two) times daily. Preservision vitamin for eyes    [provider]  potassium chloride (KLOR-CON M) 10 MEQ tablet 1 tablet (10 meq) every Tuesday and Friday. 03/02/23   Tobb, Kardie, DO  torsemide (DEMADEX) 5 MG tablet Take 1 tablet (5 mg total) by mouth 2 (two) times a week. Tuesday and Friday. 03/04/23   Tobb, Kardie, DO  triamcinolone (NASACORT) 55 MCG/ACT AERO nasal inhaler Place 1 spray into the nose daily. Patient not taking: Reported on 03/03/2023 08/20/21   Marcelyn Bruins, MD  VITAMIN A PO Take by mouth daily.    [provider]  vitamin E 400 UNIT capsule Take 1 capsule by mouth daily.    [provider]      Allergies    Alendronate sodium, Anaplex hd, Aspirin, Azithromycin, Calcium, Ciprofloxacin, Codeine, Dorzolamide hcl-timolol mal, Doxycycline, Eliquis [apixaban], Gabapentin, Hydrocodone, Ibandronate sodium, Ibuprofen, Penicillins, Pradaxa [dabigatran etexilate mesylate], Raloxifene, Risedronate sodium, Sulfonamide derivatives, Teriparatide, Travoprost, and Xarelto [rivaroxaban]    Review of Systems   Review of Systems  All other systems reviewed and are negative.   Physical Exam Updated Vital Signs BP (!) 127/98 (BP Location: Left Arm)   Pulse (!) 107   Temp  98 F (36.7 C)   Resp 16   Ht 5\' 1"  (1.549 m)   Wt 50.8 kg   SpO2 96%   BMI 21.16 kg/m  Physical Exam Vitals and nursing note reviewed.  Constitutional:      Appearance: She is well-developed.  HENT:     Head: Normocephalic.     Right Ear: External ear normal.     Left Ear: External ear normal.     Nose: Nose normal.     Mouth/Throat:     Mouth: Mucous membranes are moist.  Eyes:     Pupils: Pupils are equal, round, and reactive to light.  Cardiovascular:     Rate and Rhythm:  Tachycardia present.     Comments: Heart rate varies form 80's to 110 range. Pulmonary:     Effort: Pulmonary effort is normal.  Abdominal:     General: There is no distension.     Palpations: Abdomen is soft.  Musculoskeletal:        General: Swelling present.     Cervical back: Normal range of motion.     Right lower leg: Edema present.     Left lower leg: Edema present.  Skin:    General: Skin is warm.  Neurological:     General: No focal deficit present.     Mental Status: She is alert and oriented to person, place, and time.     ED Results / Procedures / Treatments   Labs (all labs ordered are listed, but only abnormal results are displayed) Labs Reviewed  COMPREHENSIVE METABOLIC PANEL - Abnormal; Notable for the following components:      Result Value   Glucose, Bld 111 (*)    GFR, Estimated 52 (*)    All other components within normal limits  BRAIN NATRIURETIC PEPTIDE - Abnormal; Notable for the following components:   B Natriuretic Peptide 371.8 (*)    All other components within normal limits  CBC WITH DIFFERENTIAL/PLATELET  URINALYSIS, W/ REFLEX TO CULTURE (INFECTION SUSPECTED)    EKG None  Radiology No results found.  Procedures Procedures    Medications Ordered in ED Medications - No data to display  ED Course/ Medical Decision Making/ A&P                                 Medical Decision Making Patient complains of swelling in both lower legs.  Amount and/or Complexity of Data Reviewed Independent Historian:     Details: Patient is here with her daughter who is supportive External Data Reviewed: notes.    Details: Primary care notes and cardiology notes reviewed Labs: ordered. Decision-making details documented in ED Course.    Details: Labs ordered reviewed and interpreted BNP is elevated to 371. Radiology: ordered and independent interpretation performed. Decision-making details documented in ED Course.    Details: Asked x-ray ordered  reviewed and interpreted ECG/medicine tests: ordered and independent interpretation performed. Decision-making details documented in ED Course.    Details: EKG shows A-fib  Risk Risk Details: Dr. Andria Meuse into see and examine.  He advised patient to take an increased dosage of her furosemide today.  Patient is advised to follow-up with her primary care physician for recheck.           Final Clinical Impression(s) / ED Diagnoses Final diagnoses:  Lower extremity edema    Rx / DC Orders ED Discharge Orders     None  An After Visit Summary was printed and given to the patient.    Elson Areas, [name] 03/22/23 1659    Anders Simmonds T, DO 03/23/23 2236

## 2023-03-22 NOTE — Telephone Encounter (Signed)
Pt c/o swelling/edema: STAT if pt has developed SOB within 24 hours  If swelling, where is the swelling located? Both legs and hands  How much weight have you gained and in what time span? 4lbs in about a week.   Have you gained 2 pounds in a day or 5 pounds in a week? no  Do you have a log of your daily weights (if so, list)? no  Are you currently taking a fluid pill? Yes  Are you currently SOB? no  Have you traveled recently in a car or plane for an extended period of time? No  Patient states that she was recently put on levothyroxine (SYNTHROID) 50 MCG tablet by Dr. Clent Ridges and that is went the swelling started.  She states she has stopped taking the medication but continues with the swelling.

## 2023-03-22 NOTE — ED Triage Notes (Signed)
Pt arrives ambulatory to ED with daughter. Pt states that she has been having some swelling in legs but swelling has gotten worse and extended up into her thighs. Pt is in compression hose.

## 2023-03-30 ENCOUNTER — Emergency Department (HOSPITAL_BASED_OUTPATIENT_CLINIC_OR_DEPARTMENT_OTHER)
Admission: EM | Admit: 2023-03-30 | Discharge: 2023-03-30 | Disposition: A | Discharge status: Home / Self Care | Payer: Medicare PPO | Attending: Emergency Medicine | Admitting: Emergency Medicine

## 2023-03-30 ENCOUNTER — Encounter (HOSPITAL_BASED_OUTPATIENT_CLINIC_OR_DEPARTMENT_OTHER): Payer: Self-pay | Admitting: *Deleted

## 2023-03-30 ENCOUNTER — Emergency Department (HOSPITAL_BASED_OUTPATIENT_CLINIC_OR_DEPARTMENT_OTHER): Payer: Medicare PPO

## 2023-03-30 ENCOUNTER — Other Ambulatory Visit: Payer: Self-pay

## 2023-03-30 ENCOUNTER — Emergency Department (HOSPITAL_BASED_OUTPATIENT_CLINIC_OR_DEPARTMENT_OTHER): Payer: Medicare PPO | Admitting: Radiology

## 2023-03-30 DIAGNOSIS — R002 Palpitations: Secondary | ICD-10-CM | POA: Diagnosis present

## 2023-03-30 DIAGNOSIS — R600 Localized edema: Secondary | ICD-10-CM | POA: Insufficient documentation

## 2023-03-30 DIAGNOSIS — I482 Chronic atrial fibrillation, unspecified: Secondary | ICD-10-CM | POA: Insufficient documentation

## 2023-03-30 LAB — CBC WITH DIFFERENTIAL/PLATELET
Abs Immature Granulocytes: 0.02 10*3/uL (ref 0.00–0.07)
Basophils Absolute: 0 10*3/uL (ref 0.0–0.1)
Basophils Relative: 1 %
Eosinophils Absolute: 0 10*3/uL (ref 0.0–0.5)
Eosinophils Relative: 1 %
HCT: 35.2 % — ABNORMAL LOW (ref 36.0–46.0)
Hemoglobin: 11.9 g/dL — ABNORMAL LOW (ref 12.0–15.0)
Immature Granulocytes: 0 %
Lymphocytes Relative: 14 %
Lymphs Abs: 0.8 10*3/uL (ref 0.7–4.0)
MCH: 29.7 pg (ref 26.0–34.0)
MCHC: 33.8 g/dL (ref 30.0–36.0)
MCV: 87.8 fL (ref 80.0–100.0)
Monocytes Absolute: 0.5 10*3/uL (ref 0.1–1.0)
Monocytes Relative: 8 %
Neutro Abs: 4.6 10*3/uL (ref 1.7–7.7)
Neutrophils Relative %: 76 %
Platelets: 167 10*3/uL (ref 150–400)
RBC: 4.01 MIL/uL (ref 3.87–5.11)
RDW: 15.2 % (ref 11.5–15.5)
WBC: 6 10*3/uL (ref 4.0–10.5)
nRBC: 0 % (ref 0.0–0.2)

## 2023-03-30 LAB — COMPREHENSIVE METABOLIC PANEL
ALT: 21 U/L (ref 0–44)
AST: 30 U/L (ref 15–41)
Albumin: 4.1 g/dL (ref 3.5–5.0)
Alkaline Phosphatase: 56 U/L (ref 38–126)
Anion gap: 9 (ref 5–15)
BUN: 13 mg/dL (ref 8–23)
CO2: 25 mmol/L (ref 22–32)
Calcium: 10.1 mg/dL (ref 8.9–10.3)
Chloride: 93 mmol/L — ABNORMAL LOW (ref 98–111)
Creatinine, Ser: 1 mg/dL (ref 0.44–1.00)
GFR, Estimated: 50 mL/min — ABNORMAL LOW (ref >60)
Glucose, Bld: 92 mg/dL (ref 70–99)
Potassium: 3.6 mmol/L (ref 3.5–5.1)
Sodium: 127 mmol/L — ABNORMAL LOW (ref 135–145)
Total Bilirubin: 0.8 mg/dL (ref <1.2)
Total Protein: 6.9 g/dL (ref 6.5–8.1)

## 2023-03-30 LAB — MAGNESIUM: Magnesium: 1.9 mg/dL (ref 1.7–2.4)

## 2023-03-30 LAB — TROPONIN I (HIGH SENSITIVITY)
Troponin I (High Sensitivity): 12 ng/L (ref <18)
Troponin I (High Sensitivity): 12 ng/L (ref <18)

## 2023-03-30 LAB — BRAIN NATRIURETIC PEPTIDE: B Natriuretic Peptide: 338.7 pg/mL — ABNORMAL HIGH (ref 0.0–100.0)

## 2023-03-30 LAB — TSH: TSH: 6.841 u[IU]/mL — ABNORMAL HIGH (ref 0.350–4.500)

## 2023-03-30 MED ORDER — METOPROLOL TARTRATE 5 MG/5ML IV SOLN
5.0000 mg | Freq: Once | INTRAVENOUS | Status: AC
  Administered 2023-03-30: 5 mg via INTRAVENOUS
  Filled 2023-03-30: qty 5

## 2023-03-30 MED ORDER — FUROSEMIDE 10 MG/ML IJ SOLN
20.0000 mg | Freq: Once | INTRAMUSCULAR | Status: AC
  Administered 2023-03-30: 20 mg via INTRAVENOUS
  Filled 2023-03-30: qty 2

## 2023-03-30 MED ORDER — ACETAMINOPHEN 325 MG PO TABS
650.0000 mg | ORAL_TABLET | Freq: Once | ORAL | Status: AC
  Administered 2023-03-30: 650 mg via ORAL
  Filled 2023-03-30: qty 2

## 2023-03-30 NOTE — ED Provider Notes (Signed)
[place] EMERGENCY DEPARTMENT AT [facility] Provider Note   CSN: [id number] Arrival date & time: 03/30/23  1402     History  Chief Complaint  Patient presents with   Leg Swelling    Heidi Moore is a 89 y.o. female.  Pt is a 89 yo female with pmhx significant for afib (not on thinners due to intolerance), arthritis, hld, mild to mod MR, mild pulmonary htn, and peripheral edema secondary to venous insufficiency .  Pt presents to the ED today with swelling in her legs.  She has this problem with her legs for a long time.  She is very active and exercises regularly.  She was on torsemide just Tues and Friday.  She came to the ED on 11/11 and was told to increase her torsemide.  She did that for a few days and swelling improved, but she felt very dehydrated.  The swelling came back and she was concerned that the covid booster on Friday, 11/15 was causing it.  She also said her obgyn took her off estrace recently and she thinks that is causing her to feel depressed.  She said they took it off her due to concerns for blood clots.  She is not on any medicine for her afib hr control due to low bp.  She denies cp, but does feel some palpitations.        Home Medications Prior to Admission medications   Medication Sig Start Date End Date Taking? Authorizing Provider  acetaminophen (TYLENOL) 325 MG tablet Take 650 mg by mouth every 6 (six) hours as needed for mild pain or headache.    [provider]  acetic acid-hydrocortisone (VOSOL-HC) OTIC solution Place 3 drops into both ears 2 (two) times daily. Patient not taking: Reported on 03/03/2023 05/25/22   Karie Georges, MD  Ascorbic Acid (VITAMIN C PO) Take 500 mg by mouth 2 (two) times daily.    [provider]  cholecalciferol (VITAMIN D) 1000 UNITS tablet Take 1,000 Units by mouth 2 (two) times daily.     [provider]  Cyanocobalamin (VITAMIN B 12 PO) Take by mouth daily.    [provider]  estradiol (ESTRACE) 0.5 MG tablet Take 0.5 mg by mouth daily.    [provider]  fexofenadine [facility] ALLERGY CHILDRENS) 30 MG/5ML suspension 1/4 tsp daily Patient not taking: Reported on 03/03/2023 08/20/21   Marcelyn Bruins, MD  levothyroxine (SYNTHROID) 50 MCG tablet Take 1 tablet (50 mcg total) by mouth daily before breakfast. 03/04/23   Nelwyn Salisbury, MD  OVER THE COUNTER MEDICATION 2 (two) times daily. Preservision vitamin for eyes    [provider]  potassium chloride (KLOR-CON M) 10 MEQ tablet 1 tablet (10 meq) every Tuesday and Friday. 03/02/23   Tobb, Kardie, DO  torsemide (DEMADEX) 5 MG tablet Take 1 tablet (5 mg total) by mouth 2 (two) times a week. Tuesday and Friday. 03/04/23   Tobb, Kardie, DO  triamcinolone (NASACORT) 55 MCG/ACT AERO nasal inhaler Place 1 spray into the nose daily. Patient not taking: Reported on 03/03/2023 08/20/21   Marcelyn Bruins, MD  VITAMIN A PO Take by mouth daily.    [provider]  vitamin E 400 UNIT capsule Take 1 capsule by mouth daily.    [provider]      Allergies    Alendronate sodium, Anaplex hd, Aspirin, Azithromycin, Calcium, Ciprofloxacin, Codeine, Dorzolamide hcl-timolol mal, Doxycycline, Eliquis [apixaban], Gabapentin, Hydrocodone, Ibandronate sodium, Ibuprofen, Penicillins,  Pradaxa [dabigatran etexilate mesylate], Raloxifene, Risedronate sodium, Sulfonamide derivatives, Teriparatide, Travoprost, and Xarelto [rivaroxaban]    Review of Systems   Review of Systems  Cardiovascular:  Positive for palpitations and leg swelling.  All other systems reviewed and are negative.   Physical Exam Updated Vital Signs BP 120/63   Pulse (!) 110   Temp 99 F (37.2 C) (Oral)   Resp 16   Wt 50.8 kg   SpO2 96%   BMI 21.16 kg/m  Physical Exam Vitals and nursing note reviewed.  Constitutional:      Appearance: Normal appearance.  HENT:     Head: Normocephalic and  atraumatic.     Right Ear: External ear normal.     Left Ear: External ear normal.     Nose: Nose normal.     Mouth/Throat:     Mouth: Mucous membranes are dry.  Eyes:     Extraocular Movements: Extraocular movements intact.     Conjunctiva/sclera: Conjunctivae normal.     Pupils: Pupils are equal, round, and reactive to light.  Cardiovascular:     Rate and Rhythm: Tachycardia present. Rhythm irregular.     Pulses: Normal pulses.     Heart sounds: Normal heart sounds.  Pulmonary:     Effort: Pulmonary effort is normal.     Breath sounds: Normal breath sounds.  Abdominal:     General: Abdomen is flat. Bowel sounds are normal.  Musculoskeletal:        General: Normal range of motion.     Cervical back: Normal range of motion and neck supple.     Right lower leg: Edema present.     Left lower leg: Edema present.  Skin:    General: Skin is warm.     Capillary Refill: Capillary refill takes less than 2 seconds.  Neurological:     General: No focal deficit present.     Mental Status: She is alert and oriented to person, place, and time.  Psychiatric:        Mood and Affect: Mood normal.        Behavior: Behavior normal.     ED Results / Procedures / Treatments   Labs (all labs ordered are listed, but only abnormal results are displayed) Labs Reviewed  COMPREHENSIVE METABOLIC PANEL - Abnormal; Notable for the following components:      Result Value   Sodium 127 (*)    Chloride 93 (*)    GFR, Estimated 50 (*)    All other components within normal limits  BRAIN NATRIURETIC PEPTIDE - Abnormal; Notable for the following components:   B Natriuretic Peptide 338.7 (*)    All other components within normal limits  CBC WITH DIFFERENTIAL/PLATELET - Abnormal; Notable for the following components:   Hemoglobin 11.9 (*)    HCT 35.2 (*)    All other components within normal limits  TSH - Abnormal; Notable for the following components:   TSH 6.841 (*)    All other components within  normal limits  MAGNESIUM  TROPONIN I (HIGH SENSITIVITY)  TROPONIN I (HIGH SENSITIVITY)    EKG EKG Interpretation Date/Time:  Tuesday March 30 2023 15:09:06 EST Ventricular Rate:  132 PR Interval:    QRS Duration:  82 QT Interval:  324 QTC Calculation: 451 R Axis:   249  Text Interpretation: Atrial fibrillation Paired ventricular premature complexes Left anterior fascicular block Probable lateral infarct, age indeterminate Probable anteroseptal infarct, old No significant change since last tracing Confirmed by Jacalyn Lefevre [phone]) on  03/30/2023 3:39:50 PM  Radiology US Venous Img Lower Bilateral (DVT)  Result Date: 03/30/2023 CLINICAL DATA:  Bilateral lower extremity edema for 10 days. EXAM: BILATERAL LOWER EXTREMITY VENOUS DOPPLER ULTRASOUND TECHNIQUE: Gray-scale sonography with compression, as well as color and duplex ultrasound, were performed to evaluate the deep venous system(s) from the level of the common femoral vein through the popliteal and proximal calf veins. COMPARISON:  None Available. FINDINGS: VENOUS Normal compressibility of the common femoral, superficial femoral, and popliteal veins, as well as the visualized calf veins bilaterally. Visualized portions of profunda femoral vein and great saphenous vein unremarkable. No filling defects to suggest DVT on grayscale or color Doppler imaging. Doppler waveforms show normal direction of venous flow, normal respiratory plasticity and response to augmentation. OTHER Subcutaneous edema is present bilaterally. Limitations: none IMPRESSION: No evidence of deep venous thrombosis bilaterally. Electronically Signed   By: Thornell Sartorius [name]   On: 03/30/2023 22:37   DG Chest 2 View  Result Date: 03/30/2023 CLINICAL DATA:  Bilateral lower extremity swelling. EXAM: CHEST - 2 VIEW COMPARISON:  Chest radiograph dated 03/22/2023. FINDINGS: Small bilateral pleural effusions, left greater than right. There is left lung base atelectasis or  infiltrate. No pneumothorax. Stable cardiomegaly. Atherosclerotic calcification of the aorta. No acute osseous pathology. IMPRESSION: 1. Small bilateral pleural effusions, left greater than right. 2. Left lung base atelectasis or infiltrate. Electronically Signed   By: Elgie Collard [name]   On: 03/30/2023 20:39    Procedures Procedures    Medications Ordered in ED Medications  metoprolol tartrate (LOPRESSOR) injection 5 mg (5 mg Intravenous Given 03/30/23 1556)  furosemide (LASIX) injection 20 mg (20 mg Intravenous Given 03/30/23 1553)  acetaminophen (TYLENOL) tablet 650 mg (650 mg Oral Given 03/30/23 1816)    ED Course/ Medical Decision Making/ A&P                                 Medical Decision Making Amount and/or Complexity of Data Reviewed Labs: ordered. Radiology: ordered.  Risk OTC drugs. Prescription drug management.   This patient presents to the ED for concern of leg swelling, this involves an extensive number of treatment options, and is a complaint that carries with it a high risk of complications and morbidity.  The differential diagnosis includes chf, peripheral vascular disease, infection, dvt   Co morbidities that complicate the patient evaluation  afib (not on thinners due to intolerance), arthritis, hld, mild to mod MR, mild pulmonary htn, and peripheral edema secondary to venous insufficiency   Additional history obtained:  Additional history obtained from epic chart review External records from outside source obtained and reviewed including daughter   Lab Tests:  I Ordered, and personally interpreted labs.  The pertinent results include:  cbc with hgb 11.9 (stable), cmp nl other than na low at 127, BNP sl elevated at 338.7, trop nl at 12   Imaging Studies ordered:  I ordered imaging studies including cxr and [place] I independently visualized and interpreted imaging which showed  [place]: No evidence of deep venous thrombosis bilaterally.  CXR: Small  bilateral pleural effusions, left greater than right.  2. Left lung base atelectasis or infiltrate.   I agree with the radiologist interpretation   Cardiac Monitoring:  The patient was maintained on a cardiac monitor.  I personally viewed and interpreted the cardiac monitored which showed an underlying rhythm of: afib   Medicines ordered and prescription drug management:  I  ordered medication including lasix/lopressor  for sx  Reevaluation of the patient after these medicines showed that the patient improved I have reviewed the patients home medicines and have made adjustments as needed   Problem List / ED Course:  Afib:  lopressor given and HR has improved.  Pt is able to ambulate and feels better.  Peripheral edema:  lasix given and pt diuresed.  Pt will be d/c and told to take the demadex daily for the next 3 days.  She is to continue exercise and compression hose.   Reevaluation:  After the interventions noted above, I reevaluated the patient and found that they have :improved   Social Determinants of Health:  Lives at home   Dispostion:  After consideration of the diagnostic results and the patients response to treatment, I feel that the patent would benefit from discharge with outpatient f/u.          Final Clinical Impression(s) / ED Diagnoses Final diagnoses:  Atrial fibrillation, chronic (HCC)  Peripheral edema    Rx / DC Orders ED Discharge Orders     None         Jacalyn Lefevre, MD 03/30/23 2252

## 2023-03-30 NOTE — ED Triage Notes (Addendum)
BIB family from home for leg swelling, as well as muscle and joint pain. Onset yesterday. Concerned that it is a reaction to the Covid vaccine on Friday. Pt alert, NAD, calm, interactive, steady gait with cane, resps e/u, speaking in clear complete sentences. Aching is mild. Taking fluid pill and potassium. H/o afib. Recent ED work-up on 11/11.

## 2023-03-30 NOTE — Discharge Instructions (Addendum)
Take the Demadex (torsemide) daily for the next 3 days (Wed, Thurs, and Fri).

## 2023-04-06 ENCOUNTER — Ambulatory Visit: Payer: Medicare PPO | Admitting: Family Medicine

## 2023-04-06 ENCOUNTER — Encounter: Payer: Self-pay | Admitting: Family Medicine

## 2023-04-06 VITALS — BP 96/60 | HR 62 | Temp 98.1°F | Wt 118.2 lb

## 2023-04-06 DIAGNOSIS — I4891 Unspecified atrial fibrillation: Secondary | ICD-10-CM | POA: Diagnosis not present

## 2023-04-06 DIAGNOSIS — E039 Hypothyroidism, unspecified: Secondary | ICD-10-CM

## 2023-04-06 DIAGNOSIS — R600 Localized edema: Secondary | ICD-10-CM | POA: Diagnosis not present

## 2023-04-06 NOTE — Progress Notes (Signed)
   Subjective:    Patient ID: Heidi Moore, female    DOB: June 27, 1919, 89 y.o.   MRN: [id number]  HPI Here with her daughter to follow up on ED visits on 03-22-23 and on 111-19-24 for swelling in both legs. She denies any SOB. The swelling goes down overnight. Her GFR was stable at 52, Hgb was 11.9, and BNP was 371.8. She last saw Cardiology on 03-03-23 and they had agreed for her to take Torsemide 5 mg twice a week. Over the past 2 weeks she has actually increased this to 3 days a week (Mon, Wed, Fri). She elevated her legs in bed at night. She denies any SOB. A CXR showed small bilateral pleural effusions only. Her last ECHO on 12-31-22 showed an EF of 55-60% with indeterminate diastolic function and moderate MV regurgitation. She remains in atrial fibrillation. We have have had many discussions about her thyroid medication, but she insists it gives her diarrhea so she has cut herself down to taking 1/2 tablet (or 25 mcg) daily. At the ED her TSH was up to 6.841.    Review of Systems  Constitutional: Negative.   Respiratory:  Negative for cough, shortness of breath and wheezing.   Cardiovascular:  Positive for leg swelling. Negative for chest pain and palpitations.       Objective:   Physical Exam Constitutional:      Comments: Walks with assistance   Cardiovascular:     Rate and Rhythm: Normal rate. Rhythm irregular.     Pulses: Normal pulses.     Heart sounds: Normal heart sounds.  Pulmonary:     Effort: Pulmonary effort is normal.     Breath sounds: Normal breath sounds.  Musculoskeletal:     Comments: 3+ edema in both lower legs  Neurological:     Mental Status: She is alert.           Assessment & Plan:  Her atrial fibrillation and CHF are stable, but her leg edema remains a problem. She will continue with taking Torsemide 5 mg 3 days a week. Her BP is stable. We have had great difficulty treating her hypothyroidism, so we will refer her to Endocrinology to assist [place]. We  spent a total of (35   ) minutes reviewing records and discussing these issues.  Gershon Crane, MD

## 2023-04-16 ENCOUNTER — Telehealth: Payer: Self-pay | Admitting: Cardiology

## 2023-04-16 NOTE — Telephone Encounter (Signed)
Pt c/o swelling/edema: STAT if pt has developed SOB within 24 hours  If swelling, where is the swelling located? legs  How much weight have you gained and in what time span? Not sure  Have you gained 2 pounds in a day or 5 pounds in a week? Not sure  Do you have a log of your daily weights (if so, list)? no  Are you currently taking a fluid pill? yes  Are you currently SOB? no  Have you traveled recently in a car or plane for an extended period of time? No Pt does not feel like the fluid pill is strong enough. She is hardly urinating.

## 2023-04-16 NOTE — Telephone Encounter (Signed)
Not sure why this was sent to [facility] triage. Reviewed:  Per your last note severe bilateral LE edema likely due to venous insufficiency. Recommended Torsemide 5mg , KDur 2x per week. ED visit 03/22/23 LE edema. She updated the office she was taking 3x per week. ED visit palpitations (afib, not on BB due to baseline bradycardia, she was given Lopressor with improvement).Labs 03/30/23 K 3.6, creatinine 1.00.   Her BP is routinely 90s-100s/60s limiting diuretic titration.   Would it be reasonable to have her take her Torsemide 5mg , KDur daily x 3 days then return to 3x per week? Or would you prefer to trial increased dose?  Alver Sorrow, NP

## 2023-04-18 ENCOUNTER — Telehealth: Payer: Self-pay | Admitting: Cardiology

## 2023-04-18 ENCOUNTER — Emergency Department (HOSPITAL_BASED_OUTPATIENT_CLINIC_OR_DEPARTMENT_OTHER): Payer: Medicare PPO

## 2023-04-18 ENCOUNTER — Emergency Department (HOSPITAL_BASED_OUTPATIENT_CLINIC_OR_DEPARTMENT_OTHER)
Admission: EM | Admit: 2023-04-18 | Discharge: 2023-04-18 | Disposition: A | Discharge status: Home / Self Care | Payer: Medicare PPO | Attending: Emergency Medicine | Admitting: Emergency Medicine

## 2023-04-18 ENCOUNTER — Encounter (HOSPITAL_BASED_OUTPATIENT_CLINIC_OR_DEPARTMENT_OTHER): Payer: Self-pay | Admitting: Emergency Medicine

## 2023-04-18 ENCOUNTER — Other Ambulatory Visit: Payer: Self-pay

## 2023-04-18 DIAGNOSIS — R609 Edema, unspecified: Secondary | ICD-10-CM

## 2023-04-18 DIAGNOSIS — R600 Localized edema: Secondary | ICD-10-CM | POA: Diagnosis present

## 2023-04-18 HISTORY — DX: Unspecified atrial fibrillation: I48.91

## 2023-04-18 LAB — BASIC METABOLIC PANEL
Anion gap: 9 (ref 5–15)
BUN: 11 mg/dL (ref 8–23)
CO2: 27 mmol/L (ref 22–32)
Calcium: 9.4 mg/dL (ref 8.9–10.3)
Chloride: 101 mmol/L (ref 98–111)
Creatinine, Ser: 0.98 mg/dL (ref 0.44–1.00)
GFR, Estimated: 52 mL/min — ABNORMAL LOW (ref >60)
Glucose, Bld: 91 mg/dL (ref 70–99)
Potassium: 3.5 mmol/L (ref 3.5–5.1)
Sodium: 137 mmol/L (ref 135–145)

## 2023-04-18 LAB — CBC WITH DIFFERENTIAL/PLATELET
Abs Immature Granulocytes: 0.02 10*3/uL (ref 0.00–0.07)
Basophils Absolute: 0 10*3/uL (ref 0.0–0.1)
Basophils Relative: 1 %
Eosinophils Absolute: 0.1 10*3/uL (ref 0.0–0.5)
Eosinophils Relative: 1 %
HCT: 34.9 % — ABNORMAL LOW (ref 36.0–46.0)
Hemoglobin: 11.8 g/dL — ABNORMAL LOW (ref 12.0–15.0)
Immature Granulocytes: 0 %
Lymphocytes Relative: 13 %
Lymphs Abs: 0.8 10*3/uL (ref 0.7–4.0)
MCH: 29.6 pg (ref 26.0–34.0)
MCHC: 33.8 g/dL (ref 30.0–36.0)
MCV: 87.7 fL (ref 80.0–100.0)
Monocytes Absolute: 0.6 10*3/uL (ref 0.1–1.0)
Monocytes Relative: 11 %
Neutro Abs: 4.4 10*3/uL (ref 1.7–7.7)
Neutrophils Relative %: 74 %
Platelets: 166 10*3/uL (ref 150–400)
RBC: 3.98 MIL/uL (ref 3.87–5.11)
RDW: 16.1 % — ABNORMAL HIGH (ref 11.5–15.5)
WBC: 6 10*3/uL (ref 4.0–10.5)
nRBC: 0 % (ref 0.0–0.2)

## 2023-04-18 LAB — BRAIN NATRIURETIC PEPTIDE: B Natriuretic Peptide: 332.4 pg/mL — ABNORMAL HIGH (ref 0.0–100.0)

## 2023-04-18 MED ORDER — FUROSEMIDE 10 MG/ML IJ SOLN
40.0000 mg | Freq: Once | INTRAMUSCULAR | Status: AC
  Administered 2023-04-18: 40 mg via INTRAVENOUS
  Filled 2023-04-18: qty 4

## 2023-04-18 NOTE — Telephone Encounter (Signed)
Patient called the answering service this morning concerned that she is continues to have lower extremity swelling.  Reports that she has been noticing worsening swelling in bilateral lower extremities for the past week or so.  She has been on a regimen of torsemide 5 mg 3 times a week, but over the past week has been taking it every day due to increased swelling.  She has not noticed much improvement in the swelling taking daily torsemide.  Yesterday evening, she noted that her legs started to leak fluids.  This morning, woke up and her legs continue to leak fluids.  This morning, she also feels like the swelling has spread.  Earlier this week, swelling was below knees bilaterally.  Now, she is starting to feel swollen/tight in her thighs, hips.  Reports that her breathing is okay, and she does feel well overall aside from the swelling.  Patient reports that she tends to have low blood pressure, with her systolic BP often being below 578.  In the past, her low BP has limited titration of diuretics.  She was seen in the ED on 11/11 with lower extremity swelling.  Was treated with IV Lasix, noted significant improvement in her symptoms.  Has not been seen by cardiology since that time.  Of note, patient was 89 years old.  As patient is having bilateral lower swelling to her hips, and as her legs are leaking fluid, I believe it is best that she come to the ED for IV diuretics. Her symptoms have worsened despite her taking her torsemide every day (was previously only on 3x per week). Discussed recommendations with patient who is in agreement with plan. She is going to call her daughter and present to the [facility] ED today.   Sending to Dr. Servando Salina as an Lorain Childes.   Jonita Albee, [name] 04/18/2023 10:29 AM

## 2023-04-18 NOTE — ED Provider Notes (Signed)
[place] EMERGENCY DEPARTMENT AT [facility] Provider Note   CSN: [id number] Arrival date & time: 04/18/23  1141     History  Chief Complaint  Patient presents with   Leg Swelling    Heidi Moore is a 89 y.o. female.  This is a 89 year old female, who is very quick to point out that she will be 89 in a couple of weeks, here today for leg swelling.  This been an ongoing problem for her over the last several weeks.  She has been going on intermittent higher doses of her diuretics without improvement.  She is here today because the swelling has gotten worse.  She is not have any difficulty breathing.  Patient is adamant that she does not want to be admitted to the hospital.        Home Medications Prior to Admission medications   Medication Sig Start Date End Date Taking? Authorizing Provider  acetaminophen (TYLENOL) 325 MG tablet Take 650 mg by mouth every 6 (six) hours as needed for mild pain or headache.    [provider]  acetic acid-hydrocortisone (VOSOL-HC) OTIC solution Place 3 drops into both ears 2 (two) times daily. Patient not taking: Reported on 03/03/2023 05/25/22   Karie Georges, MD  Ascorbic Acid (VITAMIN C PO) Take 500 mg by mouth 2 (two) times daily.    [provider]  cholecalciferol (VITAMIN D) 1000 UNITS tablet Take 1,000 Units by mouth 2 (two) times daily.     [provider]  Cyanocobalamin (VITAMIN B 12 PO) Take by mouth daily.    [provider]  estradiol (ESTRACE) 0.5 MG tablet Take 0.5 mg by mouth daily.    [provider]  fexofenadine [facility] ALLERGY CHILDRENS) 30 MG/5ML suspension 1/4 tsp daily 08/20/21   Marcelyn Bruins, MD  levothyroxine (SYNTHROID) 50 MCG tablet Take 1 tablet (50 mcg total) by mouth daily before breakfast. Patient taking differently: Take 50 mcg by mouth daily before breakfast. Taking 25 mg 03/04/23   Nelwyn Salisbury, MD  OVER THE COUNTER MEDICATION 2 (two)  times daily. Preservision vitamin for eyes    [provider]  potassium chloride (KLOR-CON M) 10 MEQ tablet 1 tablet (10 meq) every Tuesday and Friday. 03/02/23   Tobb, Kardie, DO  torsemide (DEMADEX) 5 MG tablet Take 1 tablet (5 mg total) by mouth 2 (two) times a week. Tuesday and Friday. 03/04/23   Tobb, Kardie, DO  triamcinolone (NASACORT) 55 MCG/ACT AERO nasal inhaler Place 1 spray into the nose daily. 08/20/21   Marcelyn Bruins, MD  VITAMIN A PO Take by mouth daily.    [provider]  vitamin E 400 UNIT capsule Take 1 capsule by mouth daily.    [provider]      Allergies    Alendronate sodium, Anaplex hd, Aspirin, Azithromycin, Calcium, Ciprofloxacin, Codeine, Dorzolamide hcl-timolol mal, Doxycycline, Eliquis [apixaban], Gabapentin, Hydrocodone, Ibandronate sodium, Ibuprofen, Penicillins, Pradaxa [dabigatran etexilate mesylate], Raloxifene, Risedronate sodium, Sulfonamide derivatives, Teriparatide, Travoprost, and Xarelto [rivaroxaban]    Review of Systems   Review of Systems  Physical Exam Updated Vital Signs BP 113/81 (BP Location: Left Arm)   Pulse 93   Temp 98.3 F (36.8 C) (Oral)   Resp 16   SpO2 97%  Physical Exam Vitals reviewed.  HENT:     Head: Normocephalic.  Cardiovascular:     Rate and Rhythm: Normal rate. Rhythm irregular.  Pulmonary:     Effort: Pulmonary effort is normal. No  respiratory distress.  Musculoskeletal:     Comments: 1+ bilateral lower extremity edema up to the knees  Neurological:     Mental Status: She is alert.     ED Results / Procedures / Treatments   Labs (all labs ordered are listed, but only abnormal results are displayed) Labs Reviewed  BASIC METABOLIC PANEL - Abnormal; Notable for the following components:      Result Value   GFR, Estimated 52 (*)    All other components within normal limits  CBC WITH DIFFERENTIAL/PLATELET - Abnormal; Notable for the following components:   Hemoglobin 11.8  (*)    HCT 34.9 (*)    RDW 16.1 (*)    All other components within normal limits  BRAIN NATRIURETIC PEPTIDE - Abnormal; Notable for the following components:   B Natriuretic Peptide 332.4 (*)    All other components within normal limits    EKG EKG Interpretation Date/Time:  Sunday April 18 2023 12:51:14 EST Ventricular Rate:  100 PR Interval:    QRS Duration:  92 QT Interval:  359 QTC Calculation: 463 R Axis:   224  Text Interpretation: Atrial fibrillation Paired ventricular premature complexes Right axis deviation Low voltage, extremity and precordial leads Abnormal lateral Q waves Anteroseptal infarct, age indeterminate Confirmed by Anders Simmonds [id number]) on 04/18/2023 2:13:06 PM  Radiology DG Chest Port 1 View  Result Date: 04/18/2023 CLINICAL DATA:  Cough. EXAM: PORTABLE CHEST 1 VIEW COMPARISON:  03/30/2023 FINDINGS: Cardiac enlargement with aortic atherosclerosis. Bilateral scratch set small bilateral pleural effusions identified, left greater than right. Diffuse increase interstitial prominence is identified bilaterally. Atelectasis noted within the lung bases. No airspace consolidation. Previous right shoulder arthroplasty. IMPRESSION: Cardiac enlargement, small bilateral pleural effusions and diffuse increase interstitial prominence compatible with CHF. Electronically Signed   By: Signa Kell [name]   On: 04/18/2023 12:43    Procedures Procedures    Medications Ordered in ED Medications  furosemide (LASIX) injection 40 mg (40 mg Intravenous Given 04/18/23 1330)    ED Course/ Medical Decision Making/ A&P                                 Medical Decision Making 89 year old here today for leg swelling.  Differential diagnoses include CHF, pulmonary edema, lower extremity swelling.  Plan-patient with multiple visits to the ED for the same.  She has had ultrasound which have been negative.  This appears to be her chronic lymphedema secondary to a weakened aging heart.   Discussed patient's symptoms with her, and how since she had been increasing her diuretic doses at home and still was having swelling my recommendation would be to admit her.  The patient tells me that she understands this, but she does not want to go in the hospital.  As patient is nearly 89, and is of sound mind, I think that this is reasonable.  Certainly greater risks to 89 year old being in our hospital.  Will give her some diuretics here, plan to discharge her with instructions to increase and follow-up with her PCP.  Reviewed the patient's chest x-ray, very faint pulmonary edema, not any respiratory distress.  Renal function normal.  Amount and/or Complexity of Data Reviewed Labs: ordered. Radiology: ordered.  Risk Prescription drug management.           Final Clinical Impression(s) / ED Diagnoses Final diagnoses:  Edema, unspecified type    Rx / DC Orders ED Discharge Orders  Ordered    Compression stockings       Comments:   04/18/23 1421              Anders Simmonds T, DO 04/18/23 1422

## 2023-04-18 NOTE — ED Triage Notes (Signed)
On Friday pt noted to have left lower leg edema, she has been taking lasix 3 x week and is not getting better. Today her leg is weeping.

## 2023-04-18 NOTE — Discharge Instructions (Addendum)
While you are in the emergency department you received Lasix.  I sent a prescription to your pharmacy for compression socks, but this is not something we typically do from the emergency department so it may not work.  Please follow-up with your primary care doctor regarding compression socks.  I would like you to take 10 mg of your torsemide for the next 5 days.  Please follow-up with your primary care doctor within 1 week.  Like we discussed, please return the emergency department if you develop any difficulty with your breathing.

## 2023-04-20 ENCOUNTER — Telehealth: Payer: Self-pay

## 2023-04-20 NOTE — Telephone Encounter (Signed)
Pt called to inquire about higher compression hose than the 15-20 mm Hg she has been in. She was recently in the ED for leg swelling and is adamant about trying the next level of compression.  APP felt this was fine for her to try. Pt coming in next Tuesday to get refitted and pick up her hose.

## 2023-04-26 ENCOUNTER — Telehealth: Payer: Self-pay | Admitting: *Deleted

## 2023-04-26 NOTE — Telephone Encounter (Signed)
Fax received from the [employer]-Denise Carmon, RN-04/26/2023 at 10P37am due to swollen legs and she takes a diuretic, blurred vision and dizziness.  See other notes regarding A-fib and other complaints.  Disposition stated to see PCP in 24 hours.  I spoke with the patient, offered an appt with PCP on 12/17 at 10:30 and she declined.  Stated she has an appt to get compression hose tomorrow morning and already has a visit scheduled with Dr Clent Ridges on tomorrow afternoon.  Message sent to PCP.

## 2023-04-27 ENCOUNTER — Encounter: Payer: Self-pay | Admitting: Family Medicine

## 2023-04-27 ENCOUNTER — Ambulatory Visit: Payer: Medicare PPO | Admitting: Family Medicine

## 2023-04-27 VITALS — BP 98/60 | HR 54 | Temp 98.0°F | Wt 112.8 lb

## 2023-04-27 DIAGNOSIS — M7989 Other specified soft tissue disorders: Secondary | ICD-10-CM

## 2023-04-27 DIAGNOSIS — E039 Hypothyroidism, unspecified: Secondary | ICD-10-CM

## 2023-04-27 NOTE — Progress Notes (Signed)
   Subjective:    Patient ID: Heidi Moore, female    DOB: 07-21-1919, 89 y.o.   MRN: [id number]  HPI Here to follow up on her thyroid again. We have had several discussions about her levothyroxine. She insists that this makes her dizzy.  On 03-27-23 I referred her to Endocrinology for further evaluation, but they have not heard from that office yet.    Review of Systems  Constitutional: Negative.   Respiratory: Negative.    Cardiovascular: Negative.   Neurological:  Positive for dizziness and weakness.       Objective:   Physical Exam Constitutional:      Comments: Walks with a cane   Cardiovascular:     Rate and Rhythm: Normal rate and regular rhythm.     Pulses: Normal pulses.     Heart sounds: Normal heart sounds.  Pulmonary:     Effort: Pulmonary effort is normal.     Breath sounds: Normal breath sounds.  Musculoskeletal:     Comments: 2+ edema in both lower legs. She is wearing compression hose   Neurological:     Mental Status: She is alert.           Assessment & Plan:  Hypothyroidism. We will check a TSH, free T3, and free T4 again today. She is scheduled to see her PCP, Dr. Casimiro Needle, later this week.  Gershon Crane, MD

## 2023-04-28 LAB — T4, FREE: Free T4: 0.92 ng/dL (ref 0.60–1.60)

## 2023-04-28 LAB — T3, FREE: T3, Free: 2.6 pg/mL (ref 2.3–4.2)

## 2023-04-28 LAB — TSH: TSH: 4.38 u[IU]/mL (ref 0.35–5.50)

## 2023-04-29 ENCOUNTER — Telehealth: Payer: Self-pay | Admitting: *Deleted

## 2023-04-29 NOTE — Telephone Encounter (Signed)
Copied from CRM [id number]. Topic: Clinical - Medical Advice >> Apr 29, 2023  9:51 AM Lorin Glass B wrote: Reason for CRM: Patient called in after missing call from [place] in the clinic to go over lab results. Located patients results and reviewed them with her. Patient is still confused and wants to speak with Harriett Sine directly and has questions about other next steps that were previously discussed. Callback [phone]

## 2023-04-30 ENCOUNTER — Encounter: Payer: Self-pay | Admitting: Family Medicine

## 2023-04-30 ENCOUNTER — Ambulatory Visit: Payer: Medicare PPO | Admitting: Family Medicine

## 2023-04-30 VITALS — BP 108/68 | HR 64 | Temp 98.2°F | Ht 61.0 in | Wt 110.4 lb

## 2023-04-30 DIAGNOSIS — R600 Localized edema: Secondary | ICD-10-CM

## 2023-04-30 MED ORDER — TORSEMIDE 5 MG PO TABS: ORAL_TABLET | ORAL | 2 refills | Status: DC

## 2023-04-30 MED ORDER — POTASSIUM CHLORIDE CRYS ER 10 MEQ PO TBCR: 10.0000 meq | EXTENDED_RELEASE_TABLET | Freq: Every day | ORAL | 1 refills | Status: DC

## 2023-04-30 NOTE — Patient Instructions (Signed)
Weight yourself daily -- keep your weight around 108 pounds -- if you gain 2 pounds in 24 hours then it is most likely fluid.

## 2023-04-30 NOTE — Progress Notes (Signed)
Established Patient Office Visit  Subjective   Patient ID: Heidi Moore, female    DOB: Dec 12, 1919  Age: 89 y.o. MRN: [id number]  Chief Complaint  Patient presents with   Medical Management of Chronic Issues   Results    Patient requests to discuss lab test results previously ordered by Dr Clent Ridges    Pt is here for follow up today  Peripheral edema-- pt reports she continues to struggle with her lower extremity edema. She reports that the torsemide was only given to her twice a week, but it has steadily been increase and she thinks if she takes it every day it is working better to control her swelling. Patient states that she is continuing to see her cardiologist.   ECHO was done in August and showed RA severe dilation and severe TR. RV and LV function are relatively normal.   Hypothyroid-- I reviewed her labs from last week, her thyroid levels are in the normal range. Patient continues to report dizziness and blurred vision, states that she fell in her driveway and skinned her left elbow. Pt state she is only taking 25 mcg daily of this medication. We reviewed her labs from last week.      Current Outpatient Medications  Medication Instructions   acetaminophen (TYLENOL) 650 mg, Every 6 hours PRN   acetic acid-hydrocortisone (VOSOL-HC) OTIC solution 3 drops, Both EARS, 2 times daily   Ascorbic Acid (VITAMIN C PO) 500 mg, 2 times daily   cholecalciferol (VITAMIN D) 1,000 Units, 2 times daily   Cyanocobalamin (VITAMIN B 12 PO) Daily   estradiol (ESTRACE) 0.5 mg, Daily   fexofenadine (ALLEGRA ALLERGY CHILDRENS) 30 MG/5ML suspension 1/4 tsp daily   levothyroxine (SYNTHROID) 50 mcg, Oral, Daily before breakfast   OVER THE COUNTER MEDICATION 2 times daily   potassium chloride (KLOR-CON M) 10 MEQ tablet 10 mEq, Oral, Daily   torsemide (DEMADEX) 5 MG tablet Take 2 tablets (10 mg) in the morning and 1 tablet (5 mg at night).   triamcinolone (NASACORT) 55 MCG/ACT AERO nasal inhaler 1  spray, Nasal, Daily   VITAMIN A PO Daily   vitamin E 400 UNIT capsule 1 capsule, Daily    Patient Active Problem List   Diagnosis Date Noted   Hypercoagulable state due to persistent atrial fibrillation (HCC) 02/02/2023   Synovitis of right knee 11/10/2022   Atrial fibrillation (HCC) 11/09/2022   Chronic eczematous otitis externa of both ears 05/25/2022   Anxiety 05/25/2022   Dizziness 12/12/2021   Dysuria 12/04/2021   Idiopathic polyneuropathy 12/04/2021   Hypothyroidism 12/04/2021   Optic atrophy of both eyes 12/10/2020   Intermediate stage nonexudative age-related macular degeneration of both eyes 05/21/2020   Primary open angle glaucoma of both eyes, severe stage 05/21/2020   Scarring, chorioretinal, 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 Georgianne Fick 08/24/2012   LOW BACK PAIN SYNDROME 05/07/2010   Vitamin D deficiency 04/22/2009   Allergic rhinitis 03/12/2008   Hyperlipemia 10/22/2006      Review of Systems  All other systems reviewed and are negative.     Objective:     BP 108/68   Pulse 64   Temp 98.2 F (36.8 C) (Oral)   Ht 5\' 1"  (1.549 m)   Wt 110 lb 6.4 oz (50.1 kg)   SpO2 97%   BMI 20.86 kg/m  Physical Exam Vitals reviewed.  Constitutional:      Appearance: Normal appearance. She is well-groomed and normal weight.  Eyes:     Conjunctiva/sclera: Conjunctivae normal.  Neck:     Thyroid: No thyromegaly.  Cardiovascular:     Rate and Rhythm: Normal rate and regular rhythm.     Pulses: Normal pulses.     Heart sounds: S1 normal and S2 normal.  Pulmonary:     Effort: Pulmonary effort is normal.     Breath sounds: Normal breath sounds and air entry.  Abdominal:     General: Bowel sounds are normal.  Musculoskeletal:     Right lower leg: Edema present.     Left lower leg: Edema present.   Neurological:     Mental Status: She is alert and oriented to person, place, and time. Mental status is at baseline.     Gait: Gait is intact.  Psychiatric:        Mood and Affect: Mood and affect normal.        Speech: Speech normal.        Behavior: Behavior normal.        Judgment: Judgment normal.      No results found for any visits on 04/30/23.    The ASCVD Risk score (Arnett DK, et al., 2019) failed to calculate for the following reasons:   The 2019 ASCVD risk score is only valid for ages 93 to 32    Assessment & Plan:  Lower extremity edema Assessment & Plan: Chronic, stable, her weight is down so it seems as if her swelling is improving, I gave her instructions on how to manage her swelling at home with the torsemide and daily weights, will check BMP today to look at her potassium level  Orders: -     Torsemide; Take 2 tablets (10 mg) in the morning and 1 tablet (5 mg at night).  Dispense: 90 tablet; Refill: 2 -     Potassium Chloride [facility] ER; Take 1 tablet (10 mEq total) by mouth daily.  Dispense: 90 tablet; Refill: 1 -     Basic metabolic panel; Future     Return in about 3 months (around 07/29/2023) for follow up on thyroid.    Karie Georges, MD

## 2023-05-03 NOTE — Telephone Encounter (Signed)
Called pt back no answer. Will try back later

## 2023-05-04 NOTE — Telephone Encounter (Signed)
Spoke to patient and she was seen by Dr Casimiro Needle 04/30/23 and has no further questions.

## 2023-05-10 NOTE — Assessment & Plan Note (Signed)
Chronic, stable, her weight is down so it seems as if her swelling is improving, I gave her instructions on how to manage her swelling at home with the torsemide and daily weights, will check BMP today to look at her potassium level

## 2023-05-13 ENCOUNTER — Ambulatory Visit: Payer: Medicare PPO | Admitting: Family Medicine

## 2023-05-20 ENCOUNTER — Other Ambulatory Visit: Payer: Medicare PPO

## 2023-05-26 ENCOUNTER — Ambulatory Visit: Payer: Self-pay | Admitting: Family Medicine

## 2023-05-26 ENCOUNTER — Other Ambulatory Visit (INDEPENDENT_AMBULATORY_CARE_PROVIDER_SITE_OTHER): Payer: Medicare PPO

## 2023-05-26 DIAGNOSIS — R600 Localized edema: Secondary | ICD-10-CM | POA: Diagnosis not present

## 2023-05-26 LAB — BASIC METABOLIC PANEL
BUN: 16 mg/dL (ref 6–23)
CO2: 27 meq/L (ref 19–32)
Calcium: 9.5 mg/dL (ref 8.4–10.5)
Chloride: 104 meq/L (ref 96–112)
Creatinine, Ser: 1.02 mg/dL (ref 0.40–1.20)
GFR: 44.98 mL/min — ABNORMAL LOW (ref >60.00)
Glucose, Bld: 144 mg/dL — ABNORMAL HIGH (ref 70–99)
Potassium: 3.6 meq/L (ref 3.5–5.1)
Sodium: 139 meq/L (ref 135–145)

## 2023-05-26 NOTE — Telephone Encounter (Signed)
 Patient called in stating she has been having a reaction to her Torsemide . Patient states it causes itching and burning across chest, hips, back and abdomen. Patient states she hasn't taken it today due to these side effects and she would like to see or speak to Dr. Bambi Lever asap about this. Patient states she has been to the ER multiple times and given Lasix  and doesn't experience this, and thought Dr. Bambi Lever was switching her to Lasix  but she has not seen anything regarding this switch. Advised patient Dr. Bambi Lever does not have availability for her to move her appointment to a sooner slot and this RN could get her in with another HCP in the office to discuss these concerns. Patient states she only wants to see Dr. Bambi Lever to discuss all of her concerns with. Patient will be getting her labwork done today and would like a call back on her main line if she can see Dr. Bambi Lever any sooner or seek advice on what to do about the Torsemide  reaction, due to patient stating she will not be continuing to take it.    Copied from CRM [id number]. Topic: Clinical - Red Word Triage >> May 26, 2023 10:41 AM Heidi Moore wrote: Red Word that prompted transfer to Nurse Triage: Reacting to fluid pill : itching , sneezing, and burning, legs swollen and coughing Reason for Disposition  [1] Follow-up call from patient regarding patient's clinical status AND [2] information NON-URGENT  Answer Assessment - Initial Assessment Questions 1. REASON FOR CALL or QUESTION: "What is your reason for calling today?" or "How can I best help you?" or "What question do you have that I can help answer?"     See Encounter Notes 2. CALLER: Document the source of call. (e.g., laboratory, patient).     Patient  Protocols used: PCP Call - No Triage-A-AH

## 2023-05-26 NOTE — Telephone Encounter (Signed)
 I can switch her to furosemide  if she would like no problem

## 2023-05-27 NOTE — Telephone Encounter (Signed)
Patient called back and was informed of the message below.  Patient stated she would like the have a low dose of the Rx sent to [facility].  Message sent to PCP.

## 2023-05-27 NOTE — Telephone Encounter (Signed)
Left a detailed message with the information below at the patient's home number.  Requested she return a call back to let PCP know if this is OK and what pharmacy she would like to have the Rx sent to.

## 2023-05-28 ENCOUNTER — Other Ambulatory Visit: Payer: Medicare PPO

## 2023-05-28 MED ORDER — FUROSEMIDE 20 MG PO TABS: 20.0000 mg | ORAL_TABLET | Freq: Every day | ORAL | 1 refills | Status: DC

## 2023-05-28 NOTE — Telephone Encounter (Signed)
Rx sent for furosemide 20 mg daily and torsemide was discontinued.

## 2023-05-28 NOTE — Addendum Note (Signed)
Addended by: Karie Georges on: 05/28/2023 09:57 AM   Modules accepted: Orders

## 2023-05-28 NOTE — Telephone Encounter (Signed)
Noted  

## 2023-05-31 ENCOUNTER — Ambulatory Visit: Payer: Self-pay | Admitting: Family Medicine

## 2023-05-31 NOTE — Telephone Encounter (Signed)
Patient informed of the message below. Message sent to PCP.

## 2023-05-31 NOTE — Telephone Encounter (Signed)
Copied from CRM [id number]. Topic: Clinical - Medication Question >> May 31, 2023 10:38 AM Alphonzo Lemmings O wrote: Reason for CRM: patient is calling cause she the doctor put me on lasix and picked it up and took some that might and took the next day about mid day  and started itching and burning all over and took three different times and want to cut it in 2 and its too strong   Chief Complaint: Diffuse itching  Symptoms:  Diffuse itching across entire body Frequency: Intermittent  Pertinent Negatives: Patient denies rash, fever, difficulty breathing, difficulty swallowing  Disposition: [] ED /[] Urgent Care (no appt availability in office) / [] Appointment(In office/virtual)/ []  [facility]/ [] Home Care/ [] Refused Recommended Disposition /[]  Mobile Bus/ [x]  Follow-up with PCP Additional Notes: Patient states that she has started Lasix recently and has been experiencing itching across her body. She states that she took antihistamines without relief. She denies any rash, fever, difficulty breathing, or difficulty swallowing. Patient is wondering if she is able to cut her pills in half and take 10 mg instead of 20 mg. [facility] called and they stated that someone will call the patient back with instructions. Patient advised of this and agreeable with this plan.       Reason for Disposition  Taking prescription medication that could cause itching (e.g., codeine/morphine/other opiates, aspirin)  Answer Assessment - Initial Assessment Questions 1. DESCRIPTION: "Describe the itching you are having."     Diffuse itching  2. SEVERITY: "How bad is it?"    - MILD: Doesn't interfere with normal activities.   - MODERATE-SEVERE: Interferes with work, school, sleep, or other activities.      Moderate  3. SCRATCHING: "Are there any scratch marks? Bleeding?"     No 4. ONSET: "When did this begin?"      When taking Furosemide  5. CAUSE: "What do you think is causing the itching?" (ask about swimming  pools, pollen, animals, soaps, etc.)     Believes it is due to her new prescription of Furosemide   6. OTHER SYMPTOMS: "Do you have any other symptoms?"      No  Protocols used: Itching - [facility]

## 2023-06-02 NOTE — Telephone Encounter (Signed)
Ok to break the furosemide in half-- it could be dry skin ? I would have her sue some thick moisturizers to see if this improves the itching

## 2023-06-03 ENCOUNTER — Ambulatory Visit: Payer: Medicare PPO | Admitting: Family Medicine

## 2023-06-03 ENCOUNTER — Encounter: Payer: Self-pay | Admitting: Family Medicine

## 2023-06-03 VITALS — BP 100/60 | HR 75 | Temp 98.0°F | Ht 61.0 in | Wt 110.5 lb

## 2023-06-03 DIAGNOSIS — L299 Pruritus, unspecified: Secondary | ICD-10-CM | POA: Diagnosis not present

## 2023-06-03 DIAGNOSIS — E039 Hypothyroidism, unspecified: Secondary | ICD-10-CM

## 2023-06-03 DIAGNOSIS — N951 Menopausal and female climacteric states: Secondary | ICD-10-CM | POA: Diagnosis not present

## 2023-06-03 MED ORDER — ESTRADIOL 0.1 MG/24HR TD PTWK: 0.1000 mg | MEDICATED_PATCH | TRANSDERMAL | 12 refills | Status: DC

## 2023-06-03 MED ORDER — TRIAMCINOLONE ACETONIDE 0.1 % EX CREA: 1.0000 | TOPICAL_CREAM | Freq: Two times a day (BID) | CUTANEOUS | 2 refills | Status: DC

## 2023-06-03 NOTE — Progress Notes (Signed)
Established Patient Office Visit  Subjective   Patient ID: Heidi Moore, female    DOB: 04-Sep-1919  Age: 89 y.o. MRN: [id number]  Chief Complaint  Patient presents with   Medical Management of Chronic Issues    Pt is here to discuss the itching that started after she switched from the torsemide to the furosemide (pt also had itching on the torsemide as well). She took antihistamines and I had instructed her to reduce the dose of the furosemide to 1/2 tablets. She reports that the itching is better, wanted me to check the skin to make sure there was no rash. States that the furosemide did make her urinate more, less dizziness with the furosemide. Pt is reporting continued blurry vision, states that she did go to the eye doctor and she was told that the furosemide could be causing the blurry vision. States that the blurry vision is a little better on the 1/2 dose of the furosemide.  Menopausal symptoms-- pt rpeorts that her GYN took her off her estrogen in sept 2024, patient is very upset about this, states that she is crying more and her quality of life has "gone way down" states she was told that taking the estrogen could cause a blood clot. We had a long conversation about the risks/benefits of estrogen replacement therapy, pt states she understands the risk of blood clot and stroke and she wants to continue her estrogen.     Current Outpatient Medications  Medication Instructions   acetaminophen (TYLENOL) 650 mg, Every 6 hours PRN   acetic acid-hydrocortisone (VOSOL-HC) OTIC solution 3 drops, Both EARS, 2 times daily   Ascorbic Acid (VITAMIN C PO) 500 mg, 2 times daily   cholecalciferol (VITAMIN D) 1,000 Units, 2 times daily   Cyanocobalamin (VITAMIN B 12 PO) Daily   estradiol (CLIMARA - DOSED IN MG/24 HR) 0.1 mg, Transdermal, Weekly   fexofenadine (ALLEGRA ALLERGY CHILDRENS) 30 MG/5ML suspension 1/4 tsp daily   furosemide (LASIX) 20 mg, Oral, Daily   OVER THE COUNTER MEDICATION 2  times daily   potassium chloride (KLOR-CON M) 10 MEQ tablet 10 mEq, Oral, Daily   triamcinolone (NASACORT) 55 MCG/ACT AERO nasal inhaler 1 spray, Nasal, Daily   triamcinolone cream (KENALOG) 0.1 % 1 Application, Topical, 2 times daily   VITAMIN A PO Daily   vitamin E 400 UNIT capsule 1 capsule, Daily       Review of Systems  All other systems reviewed and are negative.     Objective:     BP 100/60   Pulse 75   Temp 98 F (36.7 C) (Oral)   Ht 5\' 1"  (1.549 m)   Wt 110 lb 8 oz (50.1 kg)   SpO2 97%   BMI 20.88 kg/m     Physical Exam Vitals reviewed.  Constitutional:      Appearance: Normal appearance. She is well-groomed and normal weight.  Cardiovascular:     Rate and Rhythm: Normal rate and regular rhythm.     Pulses: Normal pulses.     Heart sounds: S1 normal and S2 normal.  Pulmonary:     Effort: Pulmonary effort is normal.     Breath sounds: Normal breath sounds and air entry.  Musculoskeletal:     Right lower leg: Edema present.     Left lower leg: Edema present.  Neurological:     Mental Status: She is alert and oriented to person, place, and time. Mental status is at baseline.     Gait:  Gait is intact.  Psychiatric:        Mood and Affect: Mood and affect normal.        Speech: Speech normal.        Behavior: Behavior normal.        Judgment: Judgment normal.      No results found for any visits on 06/03/23.     The ASCVD Risk score (Arnett DK, et al., 2019) failed to calculate for the following reasons:   The 2019 ASCVD risk score is only valid for ages 96 to 45    Assessment & Plan:  Menopausal symptoms Assessment & Plan: I had a long discussion with the patient about HRT and the risk of blood clots/ stroke at this age. Patient states that she understands the risk and she will wants to continue the medication. We discussed restarting a lower dose in the form of a patch and she is agreeable to try it.   Orders: -     Estradiol; Place 1 patch  (0.1 mg total) onto the skin once a week.  Dispense: 4 patch; Refill: 12  Acquired hypothyroidism Assessment & Plan: Has been off her levothyroxine due to side effects reported. Will check new TSH and T4  Orders: -     TSH; Future -     T4, free; Future  Pruritus of skin -     Triamcinolone Acetonide; Apply 1 Application topically 2 (two) times daily.  Dispense: 45 g; Refill: 2   I looked at her skin and it looked for dry/ eczematous rather than a true drug rash-- furosemide can cause itching of skin, will treat with steroid cream. She reduced her dose to 1/2 tablet and her itching improved somewhat.   No follow-ups on file.   I spent 30 minutes with the patient discussing/ counseling patient on the risks/benefits of HRT, discussing her furosemide, and reviewing her labs, etc. Karie Georges, MD

## 2023-06-03 NOTE — Telephone Encounter (Signed)
Patient informed of the message below.

## 2023-06-03 NOTE — Patient Instructions (Addendum)
Heart rate should not go up above 120 for your age group.  Use moisturizers daily for dry skin  Continue 1/2 tablet of the furosemide daily with 1 tablet of potassium daily.   Schedule lab appointment for the thyroid test before you see me in march.

## 2023-06-07 DIAGNOSIS — N951 Menopausal and female climacteric states: Secondary | ICD-10-CM | POA: Insufficient documentation

## 2023-06-07 NOTE — Assessment & Plan Note (Signed)
Has been off her levothyroxine due to side effects reported. Will check new TSH and T4

## 2023-06-07 NOTE — Assessment & Plan Note (Signed)
I had a long discussion with the patient about HRT and the risk of blood clots/ stroke at this age. Patient states that she understands the risk and she will wants to continue the medication. We discussed restarting a lower dose in the form of a patch and she is agreeable to try it.

## 2023-06-09 ENCOUNTER — Other Ambulatory Visit: Payer: Medicare PPO

## 2023-06-17 ENCOUNTER — Emergency Department (HOSPITAL_BASED_OUTPATIENT_CLINIC_OR_DEPARTMENT_OTHER)
Admission: EM | Admit: 2023-06-17 | Discharge: 2023-06-17 | Disposition: A | Discharge status: Home / Self Care | Payer: Medicare PPO | Attending: Emergency Medicine | Admitting: Emergency Medicine

## 2023-06-17 ENCOUNTER — Other Ambulatory Visit: Payer: Self-pay

## 2023-06-17 ENCOUNTER — Telehealth: Payer: Self-pay | Admitting: Family Medicine

## 2023-06-17 ENCOUNTER — Encounter (HOSPITAL_BASED_OUTPATIENT_CLINIC_OR_DEPARTMENT_OTHER): Payer: Self-pay | Admitting: Emergency Medicine

## 2023-06-17 DIAGNOSIS — Z79899 Other long term (current) drug therapy: Secondary | ICD-10-CM | POA: Insufficient documentation

## 2023-06-17 DIAGNOSIS — I4891 Unspecified atrial fibrillation: Secondary | ICD-10-CM | POA: Insufficient documentation

## 2023-06-17 DIAGNOSIS — R600 Localized edema: Secondary | ICD-10-CM | POA: Diagnosis not present

## 2023-06-17 DIAGNOSIS — R42 Dizziness and giddiness: Secondary | ICD-10-CM | POA: Diagnosis not present

## 2023-06-17 DIAGNOSIS — R300 Dysuria: Secondary | ICD-10-CM | POA: Diagnosis not present

## 2023-06-17 DIAGNOSIS — M7989 Other specified soft tissue disorders: Secondary | ICD-10-CM | POA: Diagnosis present

## 2023-06-17 LAB — BASIC METABOLIC PANEL
Anion gap: 9 (ref 5–15)
BUN: 21 mg/dL (ref 8–23)
CO2: 24 mmol/L (ref 22–32)
Calcium: 9.7 mg/dL (ref 8.9–10.3)
Chloride: 104 mmol/L (ref 98–111)
Creatinine, Ser: 0.9 mg/dL (ref 0.44–1.00)
GFR, Estimated: 57 mL/min — ABNORMAL LOW (ref >60)
Glucose, Bld: 85 mg/dL (ref 70–99)
Potassium: 3.6 mmol/L (ref 3.5–5.1)
Sodium: 137 mmol/L (ref 135–145)

## 2023-06-17 LAB — URINALYSIS, ROUTINE W REFLEX MICROSCOPIC
Bilirubin Urine: NEGATIVE
Glucose, UA: NEGATIVE mg/dL
Hgb urine dipstick: NEGATIVE
Ketones, ur: NEGATIVE mg/dL
Nitrite: NEGATIVE
Protein, ur: NEGATIVE mg/dL
Specific Gravity, Urine: 1.007 (ref 1.005–1.030)
pH: 5 (ref 5.0–8.0)

## 2023-06-17 LAB — CBC
HCT: 36.6 % (ref 36.0–46.0)
Hemoglobin: 12.1 g/dL (ref 12.0–15.0)
MCH: 30.1 pg (ref 26.0–34.0)
MCHC: 33.1 g/dL (ref 30.0–36.0)
MCV: 91 fL (ref 80.0–100.0)
Platelets: 156 10*3/uL (ref 150–400)
RBC: 4.02 MIL/uL (ref 3.87–5.11)
RDW: 16.6 % — ABNORMAL HIGH (ref 11.5–15.5)
WBC: 6.7 10*3/uL (ref 4.0–10.5)
nRBC: 0 % (ref 0.0–0.2)

## 2023-06-17 LAB — CBG MONITORING, ED: Glucose-Capillary: 77 mg/dL (ref 70–99)

## 2023-06-17 MED ORDER — FUROSEMIDE 10 MG/ML IJ SOLN
40.0000 mg | Freq: Once | INTRAMUSCULAR | Status: AC
  Administered 2023-06-17: 40 mg via INTRAVENOUS
  Filled 2023-06-17: qty 4

## 2023-06-17 NOTE — ED Triage Notes (Signed)
 Pt taking Torsemide  from PCP, changed to Lasix  due to dizziness and blurred vision. Medication changed 05/28/23. Still c/o dizziness today. Weeping from L leg since last night.

## 2023-06-17 NOTE — ED Provider Notes (Addendum)
 [place] EMERGENCY DEPARTMENT AT [facility] Provider Note   CSN: [id number] Arrival date & time: 06/17/23  9048     History  Chief Complaint  Patient presents with   Dizziness   Leg Weeping    Heidi Moore is a 89 y.o. female.  Patient with longstanding leg edema.  Also history of atrial fibrillation.  Not able to tolerate any anticoagulation therapy and based on her age they stopped.  Patient is followed by [facility] [place] as well as [facility].  Patient is concerned because of the leg swelling.  She was recently switched to Lasix  20 mg tablet once in the morning that was as of January 17 by Heron Sharper her primary care doctor.  She is also supposed to be taking potassium chloride  10 mEq by mouth daily.  This was started on December 20.  Patient's had some longstanding dizziness and some blurred vision.  Patient assumes that a lot of her medications are the cause of this skin burning and itching but it looks like a lot of medications have been stopped secondary to that same symptom.  She is taking Allegra  which seems to help.  Patient did not take her Lasix  today.  Sounds like she is not taking the potassium supplement as directed.  May be taking about half the amount.  Patient is able to ambulate here without any difficulty.  It sounds as if they have chosen not to address heavily the dizziness.  Because it is mild.  In addition patient did say there was some weeping from the left leg during the night.  During the daytime patient wears compression stockings.       Home Medications Prior to Admission medications   Medication Sig Start Date End Date Taking? Authorizing Provider  acetaminophen  (TYLENOL ) 325 MG tablet Take 650 mg by mouth every 6 (six) hours as needed for mild pain or headache.    [provider]  acetic acid -hydrocortisone  (VOSOL -HC) OTIC solution Place 3 drops into both ears 2 (two) times daily. 05/25/22   Sharper Heron HERO,  MD  Ascorbic Acid  (VITAMIN C  PO) Take 500 mg by mouth 2 (two) times daily.    [provider]  cholecalciferol  (VITAMIN D ) 1000 UNITS tablet Take 1,000 Units by mouth 2 (two) times daily.     [provider]  Cyanocobalamin  (VITAMIN B 12 PO) Take by mouth daily.    [provider]  estradiol  (CLIMARA  - DOSED IN MG/24 HR) 0.1 mg/24hr patch Place 1 patch (0.1 mg total) onto the skin once a week. 06/03/23   Sharper Heron HERO, MD  fexofenadine (ALLEGRA  ALLERGY  CHILDRENS) 30 MG/5ML suspension 1/4 tsp daily 08/20/21   Jeneal Danita Macintosh, MD  furosemide  (LASIX ) 20 MG tablet Take 1 tablet (20 mg total) by mouth daily. Patient taking differently: Take 10 mg by mouth daily. 05/28/23   Sharper Heron HERO, MD  OVER THE COUNTER MEDICATION 2 (two) times daily. Preservision vitamin for eyes    [provider]  potassium chloride  (KLOR-CON  M) 10 MEQ tablet Take 1 tablet (10 mEq total) by mouth daily. 04/30/23   Sharper Heron HERO, MD  triamcinolone  (NASACORT ) 55 MCG/ACT AERO nasal inhaler Place 1 spray into the nose daily. 08/20/21   Jeneal Danita Macintosh, MD  triamcinolone  cream (KENALOG ) 0.1 % Apply 1 Application topically 2 (two) times daily. 06/03/23   Sharper Heron HERO, MD  VITAMIN A PO Take by mouth daily.    [provider]  vitamin E 400 UNIT capsule Take 1 capsule by mouth daily.    [provider]      Allergies    Alendronate sodium, Anaplex hd, Aspirin , Azithromycin, Calcium , Ciprofloxacin, Codeine, Dorzolamide hcl-timolol mal, Doxycycline , Eliquis  [apixaban ], Gabapentin , Hydrocodone, Ibandronate sodium, Ibuprofen, Penicillins, Pradaxa  [dabigatran  etexilate mesylate], Raloxifene, Risedronate sodium, Sulfonamide derivatives, Teriparatide, Travoprost, and Xarelto  [rivaroxaban ]    Review of Systems   Review of Systems  Constitutional:  Negative for chills and fever.  HENT:  Negative for ear pain and sore throat.   Eyes:  Negative for pain  and visual disturbance.  Respiratory:  Negative for cough and shortness of breath.   Cardiovascular:  Positive for leg swelling. Negative for chest pain and palpitations.  Gastrointestinal:  Negative for abdominal pain and vomiting.  Genitourinary:  Negative for dysuria and hematuria.  Musculoskeletal:  Negative for arthralgias and back pain.  Skin:  Negative for color change and rash.  Neurological:  Positive for dizziness. Negative for seizures and syncope.  All other systems reviewed and are negative.   Physical Exam Updated Vital Signs BP 110/84 (BP Location: Right Arm)   Pulse (!) 103   Temp 98.7 F (37.1 C)   Resp 17   Ht 1.549 m (5' 1)   Wt 49 kg   SpO2 97%   BMI 20.41 kg/m  Physical Exam Vitals and nursing note reviewed.  Constitutional:      General: She is not in acute distress.    Appearance: Normal appearance. She is well-developed.  HENT:     Head: Normocephalic and atraumatic.     Mouth/Throat:     Mouth: Mucous membranes are moist.  Eyes:     Extraocular Movements: Extraocular movements intact.     Conjunctiva/sclera: Conjunctivae normal.     Pupils: Pupils are equal, round, and reactive to light.  Cardiovascular:     Rate and Rhythm: Normal rate and regular rhythm.     Heart sounds: No murmur heard. Pulmonary:     Effort: Pulmonary effort is normal. No respiratory distress.     Breath sounds: Normal breath sounds.  Abdominal:     Palpations: Abdomen is soft.     Tenderness: There is no abdominal tenderness.  Musculoskeletal:        General: Swelling present.     Cervical back: Normal range of motion and neck supple.     Right lower leg: Edema present.     Left lower leg: Edema present.     Comments: Both lower extremities with excellent cap refill.  No concerns for arterial insufficiency.  Bilateral leg swelling.  Not a lot of chronic skin changes.  No signs of any infection.  No evidence of any leg weeping currently.  Skin:    General: Skin is  warm and dry.     Capillary Refill: Capillary refill takes less than 2 seconds.  Neurological:     General: No focal deficit present.     Mental Status: She is alert and oriented to person, place, and time.     Cranial Nerves: No cranial nerve deficit.     Sensory: No sensory deficit.     Motor: No weakness.  Psychiatric:        Mood and Affect: Mood normal.     ED Results / Procedures / Treatments   Labs (all labs ordered are listed, but only abnormal results are displayed) Labs Reviewed  BASIC METABOLIC PANEL - Abnormal; Notable for the following components:  Result Value   GFR, Estimated 57 (*)    All other components within normal limits  CBC - Abnormal; Notable for the following components:   RDW 16.6 (*)    All other components within normal limits  URINALYSIS, ROUTINE W REFLEX MICROSCOPIC - Abnormal; Notable for the following components:   Leukocytes,Ua SMALL (*)    Bacteria, UA MANY (*)    All other components within normal limits  URINE CULTURE  CBG MONITORING, ED    EKG None  Radiology No results found.  Procedures Procedures    Medications Ordered in ED Medications  furosemide  (LASIX ) injection 40 mg (has no administration in time range)    ED Course/ Medical Decision Making/ A&P                                 Medical Decision Making Amount and/or Complexity of Data Reviewed Labs: ordered.  Risk Prescription drug management.   Patient's cardiac monitor and EKG consistent with atrial fibrillation rate anywhere from 90s to low 100s.  Blood pressure in triage was 110/84 so some of the blood pressures done back in the room has systolics of 90.  But she is very small.  Urinalysis had many bacteria we will send for urine culture but patient clinically does not have a urinary tract infection.  Basic metabolic panel very reassuring potassium 3.6 with GFR 57 which is little better than usual for her.  CBC white count 6.7 hemoglobin 12.1 platelets  156.  Patient insist on some IV Lasix  because they gave her that on December 8.  Will give her 40 mill equivalents of Lasix  IV.  She is aware that this will not prevent the fluid long-term.  Recommend that she take her potassium and her Lasix  as directed by her primary care doctor.  Follow-up with her primary care doctor as well as cardiology.  Patient did have an echo in August 2024.  Does show a very enlarged right atrium.  There could be a component of some right heart failure.  Not clear whether there is any evidence of venous insufficiency.  Patient diuresed very well with the IV Lasix .  We will have her follow-up with her doctor and cardiology to determine further management.  And recommend that she takes her medications as she is supposed to.  Daughter is fine with this approach.   Final Clinical Impression(s) / ED Diagnoses Final diagnoses:  Bilateral lower extremity edema    Rx / DC Orders ED Discharge Orders     None         Geraldene Hamilton, MD 06/17/23 1216    Geraldene Hamilton, MD 06/17/23 [phone]

## 2023-06-17 NOTE — Discharge Instructions (Signed)
 Good diuresis here with the IV Lasix .  Recommend you follow-up with your primary care doctor as well as cardiology to see if they want to make some adjustments on your diuretics.  Continue to take your Lasix  as directed 20 mg daily first thing in the morning and continue to take your potassium supplement 10 mEq daily.

## 2023-06-17 NOTE — Telephone Encounter (Signed)
 Copied from CRM [id number]. Topic: General - Call Back - No Documentation >> Jun 17, 2023  4:31 PM Joanell B wrote: Reason for CRM: Pt stated that she had to go to the ER this morning, and they had to remove fluid from her left leg and wanted to inform her PCP. She would like a callback to speak with her PCP.

## 2023-06-17 NOTE — Telephone Encounter (Addendum)
 Pt only want to see dr Bambi Lever and md next appt is over 2 wk away. Please advise

## 2023-06-18 ENCOUNTER — Telehealth: Payer: Self-pay

## 2023-06-18 NOTE — Telephone Encounter (Signed)
 Pt has sch an appt for 07-05-2023

## 2023-06-18 NOTE — Transitions of Care (Post Inpatient/ED Visit) (Signed)
   06/18/2023  Name: Heidi Moore MRN: [id number] DOB: 1919/09/11  Today's TOC FU Call Status: Today's TOC FU Call Status:: Unsuccessful Call (1st Attempt) Unsuccessful Call (1st Attempt) Date: 06/18/23  Attempted to reach the patient regarding the most recent Inpatient/ED visit.  Follow Up Plan: Additional outreach attempts will be made to reach the patient to complete the Transitions of Care (Post Inpatient/ED visit) call.   Signature Julian Lemmings, LPN [facility] Nurse Health Advisor Direct Dial [phone]

## 2023-06-18 NOTE — Telephone Encounter (Signed)
 Lmom for pt to call back.

## 2023-06-19 LAB — URINE CULTURE: Culture: >=100000 — AB

## 2023-06-20 ENCOUNTER — Telehealth (HOSPITAL_BASED_OUTPATIENT_CLINIC_OR_DEPARTMENT_OTHER): Payer: Self-pay | Admitting: *Deleted

## 2023-06-20 NOTE — Telephone Encounter (Signed)
 Post ED Visit - Positive Culture Follow-up  Culture report reviewed by antimicrobial stewardship pharmacist: [facility] []  Rankin Dee, Pharm.D. []  Venetia Gully, Pharm.D., BCPS AQ-ID []  Garrel Crews, Pharm.D., BCPS []  Almarie Lunger, [address].D., BCPS []  [place], [place].D., BCPS, AAHIVP []  Rosaline Bihari, Pharm.D., BCPS, AAHIVP []  Vernell Meier, PharmD, BCPS []  Latanya Hint, PharmD, BCPS []  Donald Medley, PharmD, BCPS []  Rocky Bold, PharmD []  Dorothyann Alert, PharmD, BCPS [x]  Dorn Poot, PharmD  [facility] []  Rosaline Edison, PharmD []  Romona Bliss, PharmD []  Dolphus Roller, PharmD []  Veva Seip, Rph []  Vernell Daunt) Leonce, PharmD []  Eva Allis, PharmD []  Rosaline Millet, PharmD []  Iantha Batch, PharmD []  Arvin Gauss, PharmD []  Wanda Hasting, PharmD []  Ronal Rav, PharmD []  Rocky Slade, PharmD []  Bard Jeans, PharmD   Positive urine culture Do not treat per Lonni Lites, [name]  Heidi Moore 06/20/2023, 12:08 PM

## 2023-06-21 NOTE — Transitions of Care (Post Inpatient/ED Visit) (Signed)
   06/21/2023  Name: Heidi Moore MRN: [id number] DOB: [birth year]  Today's TOC FU Call Status: Today's TOC FU Call Status:: Unsuccessful Call (2nd Attempt) Unsuccessful Call (1st Attempt) Date: 06/18/23 Unsuccessful Call (2nd Attempt) Date: 06/21/23  Attempted to reach the patient regarding the most recent Inpatient/ED visit.  Follow Up Plan: Additional outreach attempts will be made to reach the patient to complete the Transitions of Care (Post Inpatient/ED visit) call.   Signature Karena Addison, LPN [facility] Nurse Health Advisor Direct Dial [phone]

## 2023-06-21 NOTE — Telephone Encounter (Signed)
 Noted ok to close-- we have had trouble with her diuretics and her ability to tolerate them because she was reporting dizziness the last time I saw her.

## 2023-06-22 ENCOUNTER — Telehealth: Payer: Self-pay

## 2023-06-22 NOTE — Telephone Encounter (Signed)
Copied from CRM [id number]. Topic: Clinical - Medical Advice >> Jun 22, 2023  2:16 PM Almira Coaster wrote: Reason for CRM: Patient is returning a call she received from the office yesterday regarding a recent ER visit on 06/17/2023.

## 2023-06-22 NOTE — Transitions of Care (Post Inpatient/ED Visit) (Signed)
   06/22/2023  Name: Heidi Moore MRN: [id number] DOB: [birth year]  Today's TOC FU Call Status: Today's TOC FU Call Status:: Unsuccessful Call (3rd Attempt) Unsuccessful Call (1st Attempt) Date: 06/18/23 Unsuccessful Call (2nd Attempt) Date: 06/21/23 Unsuccessful Call (3rd Attempt) Date: 06/22/23  Attempted to reach the patient regarding the most recent Inpatient/ED visit.  Follow Up Plan: No further outreach attempts will be made at this time. We have been unable to contact the patient.  Signature Karena Addison, LPN [facility] Nurse Health Advisor Direct Dial [phone]

## 2023-06-28 ENCOUNTER — Ambulatory Visit: Payer: Self-pay | Admitting: Family Medicine

## 2023-06-28 NOTE — Telephone Encounter (Signed)
Copied from CRM [id number]. Topic: Clinical - Red Word Triage >> Jun 28, 2023  9:45 AM Gurney Maxin H wrote: [facility] that prompted transfer to Nurse Triage: Patient states she's having bad side effects from fluid pills itching and stinging on arm, back pain back under shoulder blades, top of stomach hurst. Patient states medication was switched from "Torsemine 10mg " to furosemide (LASIX) 20 MG tablet. Had some blurriness and dizzyness.   Chief Complaint: Allergic reaction Symptoms: Itching and blurred vision Frequency: constant Pertinent Negatives: Patient denies chest pain or shortness of breath Disposition: [] ED /[] Urgent Care (no appt availability in office) / [] Appointment(In office/virtual)/ []  [place] [facility]/ [] Home Care/ [] Refused Recommended Disposition /[] [place] [facility]/ [x]  Follow-up with PCP Additional Notes: Pt's lasix recently reduced to 10mg  on 01/25, but she is still having diffuse itching (no rash). Pt also reporting blurred vision and some dizziness occasionally. Pt concerned that the lasix is dropping her BP too low or that she may be urinating too much. BP in the ED was 100/50. Pt sts that she normally runs low, but wasn't sure if the lasix is making it worse.  Pt sts that she has been drinking more Gatorade so she doesn't get dehydrated. Call made to [facility] for advice, pt PCP not in office, RN was advised to route note [facility] and someone will follow-up with patient promptly. Pt advised to hold morning lasix dose.  Reason for Disposition  Taking prescription medication that could cause itching (e.g., codeine/morphine/other opiates, aspirin)  Answer Assessment - Initial Assessment Questions 1. DESCRIPTION: "Describe the itching you are having."     Itching all over  2. SEVERITY: "How bad is it?"    - MILD: Doesn't interfere with normal activities.   - MODERATE-SEVERE: Interferes with work, school, sleep, or other activities.      Mild itching and stinging  3. SCRATCHING:  "Are there any scratch marks? Bleeding?"     No  4. ONSET: "When did this begin?"      Started since beginning the Lasix  5. CAUSE: "What do you think is causing the itching?" (ask about swimming pools, pollen, animals, soaps, etc.)     Lasix  6. OTHER SYMPTOMS: "Do you have any other symptoms?"      Stinging and lip swelling, no shortness of breath  7. PREGNANCY: "Is there any chance you are pregnant?" "When was your last menstrual period?"     No  Protocols used: Itching - [facility]

## 2023-06-29 ENCOUNTER — Telehealth: Payer: Self-pay

## 2023-06-29 NOTE — Telephone Encounter (Signed)
If she would like to switch back to the torsemide that's ok to do-- she can take it three times a week like she was before.

## 2023-06-29 NOTE — Telephone Encounter (Signed)
 Patient informed of the message below.

## 2023-06-29 NOTE — Telephone Encounter (Signed)
Copied from CRM [id number]. Topic: Clinical - Medication Question >> Jun 29, 2023 12:32 PM Alcus Dad wrote: Patient was calling in to check appt

## 2023-07-01 ENCOUNTER — Telehealth: Payer: Self-pay

## 2023-07-01 NOTE — Telephone Encounter (Signed)
Copied from CRM [id number]. Topic: General - Other >> Jul 01, 2023  8:15 AM Heidi Moore wrote: Reason for CRM: patient called stating she would someone to call her on how she should take her torasemide. Patient stated she need clarification on the directions. Patient would like to know before she eats her breakfast

## 2023-07-02 NOTE — Telephone Encounter (Signed)
Well before she was taking it three times per week and was doing fine on that regimen-- that is how the cardiologist instructed her to take the medication

## 2023-07-05 ENCOUNTER — Ambulatory Visit: Payer: Medicare PPO | Admitting: Family Medicine

## 2023-07-05 ENCOUNTER — Encounter: Payer: Self-pay | Admitting: Family Medicine

## 2023-07-05 VITALS — BP 98/58 | HR 105 | Temp 97.9°F | Ht 61.0 in | Wt 107.9 lb

## 2023-07-05 DIAGNOSIS — R600 Localized edema: Secondary | ICD-10-CM | POA: Diagnosis not present

## 2023-07-05 MED ORDER — TORSEMIDE 5 MG PO TABS: 5.0000 mg | ORAL_TABLET | Freq: Two times a day (BID) | ORAL | Status: DC

## 2023-07-05 NOTE — Telephone Encounter (Signed)
 Spoke with the patient during the visit today and informed her of the message below.

## 2023-07-05 NOTE — Progress Notes (Unsigned)
   Established Patient Office Visit  Subjective   Patient ID: Heidi Moore, female    DOB: 10-07-1919  Age: 89 y.o. MRN: [id number]  Chief Complaint  Patient presents with   Follow-up    Patient presents for ER follow up     Pt was seen in the ER on 2/6 due to the lower extremity edema. Patient states that  the torsemide is causing dizziness, thinks that every medication causes her to be dizzy. The three times a week dosing is not enough to keep her swelling down. But when she takes it too much it causes blurry vision and low blood pressure. We had a long conversation that yes diuretics can cause low blood pressure and the dizziness, however if she stops taking them then her swelling will get worse. I gave her the option on what she would like to do, she states that she would rather be dizzy     Current Outpatient Medications  Medication Instructions   acetaminophen (TYLENOL) 650 mg, Every 6 hours PRN   acetic acid-hydrocortisone (VOSOL-HC) OTIC solution 3 drops, Both EARS, 2 times daily   Ascorbic Acid (VITAMIN C PO) 500 mg, 2 times daily   cholecalciferol (VITAMIN D) 1,000 Units, 2 times daily   Cyanocobalamin (VITAMIN B 12 PO) Daily   estradiol (CLIMARA - DOSED IN MG/24 HR) 0.1 mg, Transdermal, Weekly   fexofenadine (ALLEGRA ALLERGY CHILDRENS) 30 MG/5ML suspension 1/4 tsp daily   OVER THE COUNTER MEDICATION 2 times daily   potassium chloride (KLOR-CON M) 10 MEQ tablet 10 mEq, Oral, Daily   torsemide (DEMADEX) 5 mg, Oral, 2 times daily   triamcinolone (NASACORT) 55 MCG/ACT AERO nasal inhaler 1 spray, Nasal, Daily   triamcinolone cream (KENALOG) 0.1 % 1 Application, Topical, 2 times daily   VITAMIN A PO Daily   vitamin E 400 UNIT capsule 1 capsule, Daily    {History (Optional):23778}  ROS    Objective:     BP (!) 98/58   Pulse (!) 105   Temp 97.9 F (36.6 C) (Oral)   Ht 5\' 1"  (1.549 m)   Wt 107 lb 14.4 oz (48.9 kg)   SpO2 98%   BMI 20.39 kg/m  {Vitals History  (Optional):23777}  Physical Exam   No results found for any visits on 07/05/23.  {Labs (Optional):23779}  The ASCVD Risk score (Arnett DK, et al., 2019) failed to calculate for the following reasons:   The 2019 ASCVD risk score is only valid for ages 21 to 59    Assessment & Plan:  Lower extremity edema -     Torsemide; Take 1 tablet (5 mg total) by mouth 2 (two) times daily.     Return in about 6 months (around 01/02/2024) for ok to cancel the march 4th appt.    Karie Georges, MD

## 2023-07-06 DIAGNOSIS — M5136 Other intervertebral disc degeneration, lumbar region with discogenic back pain only: Secondary | ICD-10-CM | POA: Diagnosis not present

## 2023-07-06 DIAGNOSIS — M9905 Segmental and somatic dysfunction of pelvic region: Secondary | ICD-10-CM | POA: Diagnosis not present

## 2023-07-06 DIAGNOSIS — M9903 Segmental and somatic dysfunction of lumbar region: Secondary | ICD-10-CM | POA: Diagnosis not present

## 2023-07-06 DIAGNOSIS — M9904 Segmental and somatic dysfunction of sacral region: Secondary | ICD-10-CM | POA: Diagnosis not present

## 2023-07-08 ENCOUNTER — Telehealth: Payer: Self-pay | Admitting: Cardiology

## 2023-07-08 NOTE — Telephone Encounter (Signed)
 Patient identification verified by 2 forms. Heidi Rail, RN    Called and spoke to patient  Patient states:   -she had presented to ED multiple time due to swelling in legs   -PCP is assisting with managing fluid pill   -She changed her Torsemide to 5 mg TID, this is working better to manage fluids  -unsure what Dr. Servando Salina would recommend   -concerned about taking a lot of diuretics, too much make her BP drop and cause blurred vision/dizziness Patient denies:   -SOB/difficulty breathing  -chest pain/chest pressure   -heart palpitations  Informed patient message sent to Dr. Servando Salina for input/advisement  Reviewed ED warning signs/precautions  Patient verbalized understanding, no questions at this time

## 2023-07-08 NOTE — Telephone Encounter (Signed)
 Patient requested to speak with a nurse about her care plan. Please advise.

## 2023-07-09 ENCOUNTER — Other Ambulatory Visit (INDEPENDENT_AMBULATORY_CARE_PROVIDER_SITE_OTHER): Payer: Medicare PPO

## 2023-07-09 DIAGNOSIS — E039 Hypothyroidism, unspecified: Secondary | ICD-10-CM

## 2023-07-09 LAB — TSH: TSH: 3.84 u[IU]/mL (ref 0.35–5.50)

## 2023-07-09 LAB — T4, FREE: Free T4: 0.79 ng/dL (ref 0.60–1.60)

## 2023-07-13 ENCOUNTER — Ambulatory Visit: Payer: Medicare PPO | Admitting: Family Medicine

## 2023-07-21 ENCOUNTER — Ambulatory Visit (INDEPENDENT_AMBULATORY_CARE_PROVIDER_SITE_OTHER): Payer: Medicare PPO

## 2023-07-21 ENCOUNTER — Ambulatory Visit: Payer: Self-pay | Admitting: Family Medicine

## 2023-07-21 VITALS — Ht 61.0 in | Wt 107.0 lb

## 2023-07-21 DIAGNOSIS — Z0000 Encounter for general adult medical examination without abnormal findings: Secondary | ICD-10-CM

## 2023-07-21 NOTE — Patient Instructions (Addendum)
 Heidi Moore , Thank you for taking time to come for your Medicare Wellness Visit. I appreciate your ongoing commitment to your health goals. Please review the following plan we discussed and let me know if I can assist you in the future.   Referrals/Orders/Follow-Ups/Clinician Recommendations:   This is a list of the screening recommended for you and due dates:  Health Maintenance  Topic Date Due   COVID-19 Vaccine (6 - 2024-25 season) 05/21/2023   DTaP/Tdap/Td vaccine (3 - Td or Tdap) 10/31/2023   Medicare Annual Wellness Visit  07/20/2024   Pneumonia Vaccine  Completed   Flu Shot  Completed   DEXA scan (bone density measurement)  Completed   Zoster (Shingles) Vaccine  Completed   HPV Vaccine  Aged Out    Advanced directives: (Copy Requested) Please bring a copy of your health care power of attorney and living will to the office to be added to your chart at your convenience. You can mail to [facility] [address] [place], [place] [address] or email to [email or web address]  Next Medicare Annual Wellness Visit scheduled for next year: Yes

## 2023-07-21 NOTE — Telephone Encounter (Signed)
 Red Word that prompted transfer to Nurse Triage: Patient [phone] states both legs are red, burning sensation, swollen with lots of fluids, feels so tight and achy, dizziness. Patient doesn't want to go to the ER and is asking to asking if to increase the torsemide (DEMADEX) 5 MG tablet, toke 2 pills already today- medication makes blood pressure drops. Please advise.    Chief Complaint: Swelling to both legs. Using support hose, going to the gym twice a week. "The fluid pill isn't strong enough and I need to increase it." Declines OV. Please advise pt. Symptoms: Above Frequency: This week Pertinent Negatives: Patient denies SOB,no chest pain Disposition: [] ED /[] Urgent Care (no appt availability in office) / [] Appointment(In office/virtual)/ []  [facility]/ [] Home Care/ [x] Refused Recommended Disposition /[] [facility]/ [x]  Follow-up with PCP Additional Notes: Please advise pt.  Reason for Disposition  [1] MODERATE leg swelling (e.g., swelling extends up to knees) AND [2] new-onset or worsening  Answer Assessment - Initial Assessment Questions 1. ONSET: "When did the swelling start?" (e.g., minutes, hours, days)     Last night 2. LOCATION: "What part of the leg is swollen?"  "Are both legs swollen or just one leg?"     Both legs 3. SEVERITY: "How bad is the swelling?" (e.g., localized; mild, moderate, severe)   - Localized: Small area of swelling localized to one leg.   - MILD pedal edema: Swelling limited to foot and ankle, pitting edema < 1/4 inch (6 mm) deep, rest and elevation eliminate most or all swelling.   - MODERATE edema: Swelling of lower leg to knee, pitting edema > 1/4 inch (6 mm) deep, rest and elevation only partially reduce swelling.   - SEVERE edema: Swelling extends above knee, facial or hand swelling present.      Knee 4. REDNESS: "Does the swelling look red or infected?"     No 5. PAIN: "Is the swelling painful to touch?" If Yes, ask: "How  painful is it?"   (Scale 1-10; mild, moderate or severe)     Stinging pain 6. FEVER: "Do you have a fever?" If Yes, ask: "What is it, how was it measured, and when did it start?"      No 7. CAUSE: "What do you think is causing the leg swelling?"     N/a 8. MEDICAL HISTORY: "Do you have a history of blood clots (e.g., DVT), cancer, heart failure, kidney disease, or liver failure?"     No 9. RECURRENT SYMPTOM: "Have you had leg swelling before?" If Yes, ask: "When was the last time?" "What happened that time?"     Yes 10. OTHER SYMPTOMS: "Do you have any other symptoms?" (e.g., chest pain, difficulty breathing)       No 11. PREGNANCY: "Is there any chance you are pregnant?" "When was your last menstrual period?"       No  Protocols used: Leg Swelling and Edema-A-AH

## 2023-07-21 NOTE — Telephone Encounter (Signed)
 Due to symptoms mentioned below, the patient needs an appointment for evaluation.  No answer at the patient's home number.  I left a detailed message on Terri's (the daughter's voicemail) stating I tried contacting the patient and there was no answer.  Advised to return a call to the office to schedule an appt.

## 2023-07-21 NOTE — Progress Notes (Signed)
 Subjective:   Heidi Moore is a 89 y.o. female who presents for Medicare Annual (Subsequent) preventive examination.  Visit Complete: Virtual I connected with  Heidi Moore on 07/21/23 by a audio enabled telemedicine application and verified that I am speaking with the correct person using two identifiers.  Patient Location: Home  Provider Location: Home Office  I discussed the limitations of evaluation and management by telemedicine. The patient expressed understanding and agreed to proceed.  Vital Signs: Because this visit was a virtual/telehealth visit, some criteria may be missing or patient reported. Any vitals not documented were not able to be obtained and vitals that have been documented are patient reported.  Patient Medicare AWV questionnaire was completed by the patient on 07/20/23; I have confirmed that all information answered by patient is correct and no changes since this date.  Cardiac Risk Factors include: advanced age (>54men, >60 women);Other (see comment), Risk factor comments: Dx: Afib     Objective:    Today's Vitals   07/21/23 1545  Weight: 107 lb (48.5 kg)  Height: 5\' 1"  (1.549 m)   Body mass index is 20.22 kg/m.     07/21/2023    3:58 PM 06/17/2023   10:24 AM 04/18/2023   12:00 PM 03/30/2023    2:43 PM 03/22/2023    2:30 PM 07/10/2022   11:49 AM 01/16/2022    9:36 AM  Advanced Directives  Does Patient Have a Medical Advance Directive? Yes Yes No No No Yes Yes  Type of Estate agent of [place];Living will Healthcare Power of [employer] of [place];Living will Healthcare Power of [place];Living will  Does patient want to make changes to medical advance directive?  No - Patient declined       Copy of Healthcare Power of Attorney in Chart? No - copy requested     No - copy requested   Would patient like information on creating a medical advance directive?   No - Patient declined  No - Patient declined       Current Medications (verified) Outpatient Encounter Medications as of 07/21/2023  Medication Sig   acetaminophen (TYLENOL) 325 MG tablet Take 650 mg by mouth every 6 (six) hours as needed for mild pain or headache.   acetic acid-hydrocortisone (VOSOL-HC) OTIC solution Place 3 drops into both ears 2 (two) times daily.   Ascorbic Acid (VITAMIN C PO) Take 500 mg by mouth 2 (two) times daily.   cholecalciferol (VITAMIN D) 1000 UNITS tablet Take 1,000 Units by mouth 2 (two) times daily.    Cyanocobalamin (VITAMIN B 12 PO) Take by mouth daily.   estradiol (CLIMARA - DOSED IN MG/24 HR) 0.1 mg/24hr patch Place 1 patch (0.1 mg total) onto the skin once a week.   fexofenadine (ALLEGRA ALLERGY CHILDRENS) 30 MG/5ML suspension 1/4 tsp daily   OVER THE COUNTER MEDICATION 2 (two) times daily. Preservision vitamin for eyes   potassium chloride (KLOR-CON M) 10 MEQ tablet Take 1 tablet (10 mEq total) by mouth daily.   torsemide (DEMADEX) 5 MG tablet Take 1 tablet (5 mg total) by mouth 2 (two) times daily.   triamcinolone (NASACORT) 55 MCG/ACT AERO nasal inhaler Place 1 spray into the nose daily.   triamcinolone cream (KENALOG) 0.1 % Apply 1 Application topically 2 (two) times daily.   VITAMIN A PO Take by mouth daily.   vitamin E 400 UNIT capsule Take 1 capsule by mouth daily.   No facility-administered encounter medications on  file as of 07/21/2023.    Allergies (verified) Alendronate sodium, Anaplex hd, Aspirin, Azithromycin, Calcium, Ciprofloxacin, Codeine, Dorzolamide hcl-timolol mal, Doxycycline, Eliquis [apixaban], Gabapentin, Hydrocodone, Ibandronate sodium, Ibuprofen, Penicillins, Pradaxa [dabigatran etexilate mesylate], Raloxifene, Risedronate sodium, Sulfonamide derivatives, Teriparatide, Travoprost, and Xarelto [rivaroxaban]   History: Past Medical History:  Diagnosis Date   A-fib (HCC)    AC (acromioclavicular) joint bone spurs    on right shoulder    Allergic rhinitis    Arthritis     Bursitis    right shoulder   Complication of anesthesia    headaches   Diverticulosis    DIVERTICULOSIS, COLON 10/15/2006   Qualifier: Diagnosis of  By: Alwyn Ren MD, Deborra Medina problem    treated by Dr Alben Spittle per patient   ZOXWRUEA(540.9)    sinus HA   History of blood transfusion 22yrs ago   no abnormal reaction noted   History of cystitis    History of skin cancer    Basal cell, Dr. Leta Speller   Hyperlipidemia    [place] Study LDL goal =<160    Hypotension    Joint pain    Joint swelling    Macular degeneration, dry    Osteopenia    Intolerance to Calcium,Actonel,Fosamax,Evista, Forteo: S/P Boniva 2007 to 03/2010   OSTEOPENIA 03/12/2008   Qualifier: Diagnosis of  By: Freddy Jaksch     Osteoporosis    PONV (postoperative nausea and vomiting)    Rosacea 04/22/2009   Qualifier: Diagnosis of  By: Alwyn Ren MD, William     Skin cancer 06/22/2017   per patient after excision right calf by Dr Nicholas Lose   SKIN CANCER, HX OF 03/12/2008   Qualifier: Diagnosis of  By: Freddy Jaksch     Urinary frequency    Vaginal atrophy    sees Dr. Huntley Dec   Past Surgical History:  Procedure Laterality Date   APPENDECTOMY     BUNIONECTOMY Bilateral    CATARACT EXTRACTION, BILATERAL     COLONOSCOPY     with Tics (no further f/u as per GI due to age)    JOINT REPLACEMENT Left    knee    KNEE ARTHROPLASTY  05/18/2012   Procedure: COMPUTER ASSISTED TOTAL KNEE ARTHROPLASTY;  Surgeon: Eldred Manges, MD;  Location: [facility] OR;  Service: Orthopedics;  Laterality: Left;  Left Total Knee Arthroplasty, Cemented, Computer Assisted   lt knee      meniscus tear   MOHS SURGERY     right nostril for basel cell cancer    NOSE SURGERY     due to trama at age 69    TOTAL ABDOMINAL HYSTERECTOMY W/ BILATERAL SALPINGOOPHORECTOMY     for Endometriosis    TOTAL SHOULDER ARTHROPLASTY Right 09/11/2013   Procedure: TOTAL SHOULDER ARTHROPLASTY;  Surgeon: Eldred Manges, MD;  Location: [facility] OR;  Service: Orthopedics;   Laterality: Right;  Right Total Shoulder Arthroplasty   Family History  Problem Relation Age of Onset   Stroke Mother 33   Lung cancer Sister    Diabetes Brother    Atrial fibrillation Brother        coumadin   Colon cancer Other        1st Cousin    Allergic rhinitis Son    Allergic rhinitis Daughter    Social History   Socioeconomic History   Marital status: Widowed    Spouse name: Not on file   Number of children: 3   Years of education: Not on file  Highest education level: Some college, no degree  Occupational History    Employer: RETIRED  Tobacco Use   Smoking status: Never    Passive exposure: Never   Smokeless tobacco: Never   Tobacco comments:    Never smoked 02/02/23  Vaping Use   Vaping status: Never Used  Substance and Sexual Activity   Alcohol use: No   Drug use: No   Sexual activity: Not Currently    Birth control/protection: Surgical  Other Topics Concern   Not on file  Social History Narrative   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 She lives alone. She goes to the gym and participates in aerobics 3 x week. She drinks 3 cups of coffee a day.         Social Drivers of Corporate investment banker Strain: Low Risk  (07/21/2023)   Overall Financial Resource Strain (CARDIA)    Difficulty of Paying Living Expenses: Not hard at all  Food Insecurity: No Food Insecurity (07/21/2023)   Hunger Vital Sign    Worried About Running Out of Food in the Last Year: Never true    Ran Out of Food in the Last Year: Never true  Transportation  Needs: No Transportation Needs (07/21/2023)   PRAPARE - Administrator, Civil Service (Medical): No    Lack of Transportation (Non-Medical): No  Physical Activity: Insufficiently Active (07/21/2023)   Exercise Vital Sign    Days of Exercise per Week: 2 days    Minutes of Exercise per Session: 60 min  Stress: No Stress Concern Present (07/21/2023)   [employer] of Occupational Health - Occupational Stress Questionnaire    Feeling of Stress : Not at all  Social Connections: Moderately Integrated (07/21/2023)   Social Connection and Isolation Panel [NHANES]    Frequency of Communication with Friends and Family: More than three times a week    Frequency of Social Gatherings with Friends and Family: More than three times a week    Attends Religious Services: More than 4 times per year    Active Member of [employer] or Organizations: Yes    Attends Banker Meetings: More than 4 times per year    Marital Status: Widowed    Tobacco Counseling Counseling given: Not Answered Tobacco comments: Never smoked 02/02/23   Clinical Intake:  Pre-visit preparation completed: Yes  Pain : No/denies pain     BMI - recorded: 20.22 Nutritional Status: BMI of 19-24  Normal Nutritional Risks: None Diabetes: No  How often do you need to have someone help you when you read instructions, pamphlets, or other written materials from your doctor or pharmacy?: 1 - Never  Interpreter Needed?: No  Information entered by :: Theresa Mulligan LPN   Activities of Daily Living    07/21/2023    3:54 PM 07/20/2023    9:37 AM  In your present state of health, do you have any difficulty performing the following activities:  Hearing? 1 1  Comment Wears Hearing Aids   Vision? 1 1   Difficulty concentrating or making decisions? 0 0   Walking or climbing stairs? 1 1   Comment Uses Cane and Walker   Dressing or bathing? 0 0   Doing errands, shopping? 1 1   Comment aughter Paramedic and eating ? N N   Using the Toilet? N N   In the past six months, have you accidently leaked urine? Malvin Johns   Comment Wears Depends. Followed by PCP   Do you have problems with loss of bowel control? N N   Managing your Medications? N N   Managing your Finances? N N   Housekeeping or managing your Housekeeping? Daryel Gerald      Proxy-reported    Patient Care Team: Karie Georges, MD as PCP - General (Family Medicine) Thomasene Ripple, DO as PCP - Cardiology (Cardiology) Lanier Prude, MD as PCP - Electrophysiology (Cardiology) Venancio Poisson, MD as Consulting Physician (Dermatology)  Indicate any recent Medical Services you may have received from other than [facility] providers in the past year (date may be approximate).     Assessment:   This is a routine wellness examination for Heidi Moore.  Hearing/Vision screen Hearing Screening - Comments:: Wears Hearing Aids Vision Screening - Comments:: Wears rx glasses - up to date with routine eye exams with  Dr Burgess Estelle   Goals Addressed               This Visit's Progress     Remain Active (pt-stated)         Depression Screen    07/21/2023    3:53 PM 04/30/2023    3:20 PM 07/10/2022   11:45 AM 10/07/2021    2:35 PM 07/08/2021   11:16 AM 04/18/2021   11:10 AM 03/06/2020    3:13 PM  PHQ 2/9 Scores  PHQ - 2 Score 0 1 0 0 0  0  PHQ- 9 Score  5  0   0  Exception Documentation      Other- indicate reason in comment box     Fall Risk    07/21/2023    3:57 PM 07/20/2023    9:37 AM 07/10/2022   11:47 AM 05/24/2022    4:12 PM 01/16/2022    9:35 AM  Fall Risk   Falls in the past year? 0 0  0 0  0  Number falls in past yr: 0  0  0  Injury with Fall? 0  0  0  Risk for fall due to : No Fall Risks  No Fall Risks    Follow up Falls prevention discussed;Falls evaluation completed  Falls prevention discussed       Proxy-reported    MEDICARE RISK AT HOME: Medicare Risk at Home Any stairs in or around the home?: Yes If so, are there  any without handrails?: No Home free of loose throw rugs in walkways, pet beds, electrical cords, etc?: Yes Adequate lighting in your home to reduce risk of falls?: Yes Life alert?: Yes Use of a cane, walker or w/c?: Yes Grab bars in the bathroom?: Yes Shower chair or bench in shower?: Yes Elevated toilet seat or a handicapped toilet?: Yes  TIMED UP AND GO:  Was the test performed?  No    Cognitive Function:    04/05/2018   11:50 AM 03/25/2017    1:47 PM  MMSE - Mini Mental State Exam  Not completed: -- --  07/21/2023    3:58 PM 07/10/2022   11:49 AM 07/08/2021   11:23 AM  6CIT Screen  What Year? 0 points 0 points 0 points  What month? 0 points 0 points 0 points  What time? 0 points 0 points 0 points  Count back from 20 0 points 0 points 2 points  Months in reverse 0 points 0 points 0 points  Repeat phrase 0 points 0 points 0 points  Total Score 0 points 0 points 2 points    Immunizations Immunization History  Administered Date(s) Administered   Influenza Split 02/13/2013   Influenza Whole 01/31/2008   Influenza, High Dose Seasonal PF 01/31/2016, 02/07/2017, 02/16/2018, 01/21/2019, 02/24/2023   Influenza-Unspecified 01/09/2014, 01/24/2017, 02/09/2018, 01/22/2020, 02/08/2021, 02/02/2022   PFIZER Comirnaty(Gray Top)Covid-19 Tri-Sucrose Vaccine 03/26/2023   PFIZER(Purple Top)SARS-COV-2 Vaccination 06/15/2019, 08/07/2019, 04/11/2020, 04/19/2021   PNEUMOCOCCAL CONJUGATE-20 04/28/2021   Pneumococcal Polysaccharide-23 05/11/1996   Respiratory Syncytial Virus Vaccine,Recomb Aduvanted(Arexvy) 05/25/2022   Td 05/11/1994   Tdap 10/30/2013   Zoster Recombinant(Shingrix) 03/22/2021, 07/02/2021   Zoster, Live 11/04/2010    TDAP status: Up to date  Flu Vaccine status: Up to date  Pneumococcal vaccine status: Up to date  Covid-19 vaccine status: Declined, Education has been provided regarding the importance of this vaccine but patient still declined. Advised may receive  this vaccine at local pharmacy or Health Dept.or vaccine clinic. Aware to provide a copy of the vaccination record if obtained from local pharmacy or Health Dept. Verbalized acceptance and understanding.  Qualifies for Shingles Vaccine? Yes   Zostavax completed Yes   Shingrix Completed?: Yes  Screening Tests Health Maintenance  Topic Date Due   COVID-19 Vaccine (6 - 2024-25 season) 05/21/2023   DTaP/Tdap/Td (3 - Td or Tdap) 10/31/2023   Medicare Annual Wellness (AWV)  07/20/2024   Pneumonia Vaccine 61+ Years old  Completed   INFLUENZA VACCINE  Completed   DEXA SCAN  Completed   Zoster Vaccines- Shingrix  Completed   HPV VACCINES  Aged Out    Health Maintenance  Health Maintenance Due  Topic Date Due   COVID-19 Vaccine (6 - 2024-25 season) 05/21/2023        Bone Density status: Completed 04/01/20. Results reflect: Bone density results: OSTEOPOROSIS. Repeat every   years.    Additional Screening:    Vision Screening: Recommended annual ophthalmology exams for early detection of glaucoma and other disorders of the eye. Is the patient up to date with their annual eye exam?  Yes  Who is the provider or what is the name of the office in which the patient attends annual eye exams? Dr Burgess Estelle If pt is not established with a provider, would they like to be referred to a provider to establish care? No .   Dental Screening: Recommended annual dental exams for proper oral hygiene    Community Resource Referral / Chronic Care Management:  CRR required this visit?  No   CCM required this visit?  No     Plan:     I have personally reviewed and noted the following in the patient's chart:   Medical and social history Use of alcohol, tobacco or illicit drugs  Current medications and supplements including opioid prescriptions. Patient is not currently taking opioid prescriptions. Functional ability and status Nutritional status Physical activity Advanced directives List  of other physicians Hospitalizations, surgeries, and ER visits in previous 12 months Vitals Screenings to include cognitive, depression, and falls Referrals and appointments  In addition, I have reviewed and discussed with  patient certain preventive protocols, quality metrics, and best practice recommendations. A written personalized care plan for preventive services as well as general preventive health recommendations were provided to patient.     Tillie Rung, LPN   9/56/2130   After Visit Summary: (MyChart) Due to this being a telephonic visit, the after visit summary with patients personalized plan was offered to patient via MyChart   Nurse Notes: None

## 2023-07-22 ENCOUNTER — Ambulatory Visit: Admitting: Family Medicine

## 2023-07-22 ENCOUNTER — Other Ambulatory Visit: Payer: Self-pay | Admitting: Family Medicine

## 2023-07-22 ENCOUNTER — Encounter (HOSPITAL_BASED_OUTPATIENT_CLINIC_OR_DEPARTMENT_OTHER): Payer: Self-pay | Admitting: Emergency Medicine

## 2023-07-22 ENCOUNTER — Emergency Department (HOSPITAL_BASED_OUTPATIENT_CLINIC_OR_DEPARTMENT_OTHER)
Admission: EM | Admit: 2023-07-22 | Discharge: 2023-07-22 | Disposition: A | Discharge status: Home / Self Care | Attending: Emergency Medicine | Admitting: Emergency Medicine

## 2023-07-22 ENCOUNTER — Other Ambulatory Visit: Payer: Self-pay

## 2023-07-22 ENCOUNTER — Telehealth: Payer: Self-pay | Admitting: *Deleted

## 2023-07-22 DIAGNOSIS — R600 Localized edema: Secondary | ICD-10-CM

## 2023-07-22 DIAGNOSIS — R2243 Localized swelling, mass and lump, lower limb, bilateral: Secondary | ICD-10-CM | POA: Insufficient documentation

## 2023-07-22 DIAGNOSIS — I4891 Unspecified atrial fibrillation: Secondary | ICD-10-CM | POA: Diagnosis not present

## 2023-07-22 LAB — BASIC METABOLIC PANEL
Anion gap: 9 (ref 5–15)
BUN: 18 mg/dL (ref 8–23)
CO2: 27 mmol/L (ref 22–32)
Calcium: 9.5 mg/dL (ref 8.9–10.3)
Chloride: 101 mmol/L (ref 98–111)
Creatinine, Ser: 0.96 mg/dL (ref 0.44–1.00)
GFR, Estimated: 52 mL/min — ABNORMAL LOW (ref >60)
Glucose, Bld: 103 mg/dL — ABNORMAL HIGH (ref 70–99)
Potassium: 3.1 mmol/L — ABNORMAL LOW (ref 3.5–5.1)
Sodium: 137 mmol/L (ref 135–145)

## 2023-07-22 LAB — CBC WITH DIFFERENTIAL/PLATELET
Abs Immature Granulocytes: 0.04 10*3/uL (ref 0.00–0.07)
Basophils Absolute: 0.1 10*3/uL (ref 0.0–0.1)
Basophils Relative: 1 %
Eosinophils Absolute: 0.2 10*3/uL (ref 0.0–0.5)
Eosinophils Relative: 3 %
HCT: 32.3 % — ABNORMAL LOW (ref 36.0–46.0)
Hemoglobin: 10.8 g/dL — ABNORMAL LOW (ref 12.0–15.0)
Immature Granulocytes: 1 %
Lymphocytes Relative: 15 %
Lymphs Abs: 0.9 10*3/uL (ref 0.7–4.0)
MCH: 29.8 pg (ref 26.0–34.0)
MCHC: 33.4 g/dL (ref 30.0–36.0)
MCV: 89.2 fL (ref 80.0–100.0)
Monocytes Absolute: 0.8 10*3/uL (ref 0.1–1.0)
Monocytes Relative: 12 %
Neutro Abs: 4.4 10*3/uL (ref 1.7–7.7)
Neutrophils Relative %: 68 %
Platelets: 155 10*3/uL (ref 150–400)
RBC: 3.62 MIL/uL — ABNORMAL LOW (ref 3.87–5.11)
RDW: 15.7 % — ABNORMAL HIGH (ref 11.5–15.5)
WBC: 6.4 10*3/uL (ref 4.0–10.5)
nRBC: 0 % (ref 0.0–0.2)

## 2023-07-22 LAB — BRAIN NATRIURETIC PEPTIDE: B Natriuretic Peptide: 421.7 pg/mL — ABNORMAL HIGH (ref 0.0–100.0)

## 2023-07-22 MED ORDER — POTASSIUM CHLORIDE 20 MEQ PO PACK
60.0000 meq | PACK | Freq: Every day | ORAL | Status: DC
  Administered 2023-07-22: 60 meq via ORAL
  Filled 2023-07-22: qty 3

## 2023-07-22 MED ORDER — FUROSEMIDE 10 MG/ML IJ SOLN
40.0000 mg | Freq: Once | INTRAMUSCULAR | Status: AC
  Administered 2023-07-22: 40 mg via INTRAVENOUS
  Filled 2023-07-22: qty 4

## 2023-07-22 NOTE — ED Triage Notes (Signed)
 Pt here with bilateral extremity swelling that has progressed to her upper thighs. Pt denies short of breath.

## 2023-07-22 NOTE — ED Provider Notes (Addendum)
 [place] EMERGENCY DEPARTMENT AT [facility] Provider Note   CSN: [id number] Arrival date & time: 07/22/23  1047     History  Chief Complaint  Patient presents with   Leg Swelling    Heidi Moore is a 89 y.o. female.  Patient with a chief complaint of bilateral leg swelling.  It sounds like the patient has had difficulty taking diuretics at home.  She tends to feel a bit lightheaded after she takes them and has trouble getting around.  She has had some changes to her diuretic therapy recently as well.  Is on the reported low dose of torsemide.  She does not like to take it because it does make her feel a bit lightheaded.  She also feels like her lips are swollen and she is itchy all over and thinks maybe it is allergic reaction to the same medicine.  No difficulty breathing no chest pain.  Family is concerned because the swelling down goes all the way up to her left hip.  She also feels like her hand is swollen off and on.  Especially in the joints.        Home Medications Prior to Admission medications   Medication Sig Start Date End Date Taking? Authorizing Provider  acetaminophen (TYLENOL) 325 MG tablet Take 650 mg by mouth every 6 (six) hours as needed for mild pain or headache.    [provider]  acetic acid-hydrocortisone (VOSOL-HC) OTIC solution Place 3 drops into both ears 2 (two) times daily. 05/25/22   Karie Georges, MD  Ascorbic Acid (VITAMIN C PO) Take 500 mg by mouth 2 (two) times daily.    [provider]  cholecalciferol (VITAMIN D) 1000 UNITS tablet Take 1,000 Units by mouth 2 (two) times daily.     [provider]  Cyanocobalamin (VITAMIN B 12 PO) Take by mouth daily.    [provider]  estradiol (CLIMARA - DOSED IN MG/24 HR) 0.1 mg/24hr patch Place 1 patch (0.1 mg total) onto the skin once a week. 06/03/23   Karie Georges, MD  fexofenadine [facility] ALLERGY CHILDRENS) 30 MG/5ML suspension 1/4 tsp daily  08/20/21   Marcelyn Bruins, MD  OVER THE COUNTER MEDICATION 2 (two) times daily. Preservision vitamin for eyes    [provider]  potassium chloride (KLOR-CON M) 10 MEQ tablet Take 1 tablet (10 mEq total) by mouth daily. 04/30/23   Karie Georges, MD  torsemide (DEMADEX) 5 MG tablet Take 1 tablet (5 mg total) by mouth 2 (two) times daily. 07/05/23   Karie Georges, MD  triamcinolone (NASACORT) 55 MCG/ACT AERO nasal inhaler Place 1 spray into the nose daily. 08/20/21   Marcelyn Bruins, MD  triamcinolone cream (KENALOG) 0.1 % Apply 1 Application topically 2 (two) times daily. 06/03/23   Karie Georges, MD  VITAMIN A PO Take by mouth daily.    [provider]  vitamin E 400 UNIT capsule Take 1 capsule by mouth daily.    [provider]      Allergies    Alendronate sodium, Anaplex hd, Aspirin, Azithromycin, Calcium, Ciprofloxacin, Codeine, Dorzolamide hcl-timolol mal, Doxycycline, Eliquis [apixaban], Gabapentin, Hydrocodone, Ibandronate sodium, Ibuprofen, Penicillins, Pradaxa [dabigatran etexilate mesylate], Raloxifene, Risedronate sodium, Sulfonamide derivatives, Teriparatide, Travoprost, and Xarelto [rivaroxaban]    Review of Systems   Review of Systems  Physical Exam Updated Vital Signs BP 104/77   Pulse 92   Temp 98.1 F (36.7 C) (Oral)   Resp 19  SpO2 98%  Physical Exam Vitals and nursing note reviewed.  Constitutional:      General: She is not in acute distress.    Appearance: She is well-developed. She is not diaphoretic.  HENT:     Head: Normocephalic and atraumatic.  Eyes:     Pupils: Pupils are equal, round, and reactive to light.  Cardiovascular:     Rate and Rhythm: Normal rate and regular rhythm.     Heart sounds: No murmur heard.    No friction rub. No gallop.  Pulmonary:     Effort: Pulmonary effort is normal.     Breath sounds: No wheezing or rales.  Abdominal:     General: There is no distension.      Palpations: Abdomen is soft.     Tenderness: There is no abdominal tenderness.  Musculoskeletal:        General: No tenderness.     Cervical back: Normal range of motion and neck supple.     Right lower leg: Edema present.     Left lower leg: Edema present.     Comments: Patient's hands without obvious edema.  She does have signs and symptoms consistent of OA  Skin:    General: Skin is warm and dry.  Neurological:     Mental Status: She is alert and oriented to person, place, and time.  Psychiatric:        Behavior: Behavior normal.     ED Results / Procedures / Treatments   Labs (all labs ordered are listed, but only abnormal results are displayed) Labs Reviewed  CBC WITH DIFFERENTIAL/PLATELET - Abnormal; Notable for the following components:      Result Value   RBC 3.62 (*)    Hemoglobin 10.8 (*)    HCT 32.3 (*)    RDW 15.7 (*)    All other components within normal limits  BASIC METABOLIC PANEL - Abnormal; Notable for the following components:   Potassium 3.1 (*)    Glucose, Bld 103 (*)    GFR, Estimated 52 (*)    All other components within normal limits  BRAIN NATRIURETIC PEPTIDE - Abnormal; Notable for the following components:   B Natriuretic Peptide 421.7 (*)    All other components within normal limits    EKG EKG Interpretation Date/Time:  Thursday July 22 2023 11:10:24 EDT Ventricular Rate:  101 PR Interval:    QRS Duration:  92 QT Interval:  345 QTC Calculation: 448 R Axis:   238  Text Interpretation: Atrial fibrillation Markedly posterior QRS axis Low voltage, precordial leads Anteroseptal infarct, old No significant change since last tracing Confirmed by Melene Plan [phone]) on 07/22/2023 11:33:48 AM  Radiology No results found.  Procedures Procedures    Medications Ordered in ED Medications  potassium chloride (KLOR-CON) packet 60 mEq (60 mEq Oral Given 07/22/23 1219)  furosemide (LASIX) injection 40 mg (40 mg Intravenous Given 07/22/23 1218)     ED Course/ Medical Decision Making/ A&P                                 Medical Decision Making Amount and/or Complexity of Data Reviewed Labs: ordered.  Risk Prescription drug management.   89 yo F with a chief complaint of bilateral lower extremity edema.  This been an ongoing issue for her.  She has been tried on different diuretics at home with some difficulty with patient tolerance.  Most recently on torsemide.  She is worried that maybe it is causing her to have an allergic reaction.  I do not appreciate any signs of anaphylaxis on my exam.  She does have bilateral lower extremity edema.  She has an appointment to see her doctor in about 4 hours, she told me she decided to come here instead because she thought an IV dose of a diuretic would work faster.  Will obtain a laboratory evaluation here.  Bolus dose of the diuretic.  I did encourage her to keep her appointment, I stressed the importance of seeing a consistent provider and with multiple medication changes likely needs to have constant provider to assess for complications.  No significant change to baseline anemia.  Patient's potassium is mildly low otherwise no noted electrolyte abnormalities.  Will give a bolus dose of Lasix here.  Will dose potassium.  Encouraged her to follow-up with her doctor this afternoon.  12:39 PM:  I have discussed the diagnosis/risks/treatment options with the patient and family.  Evaluation and diagnostic testing in the emergency department does not suggest an emergent condition requiring admission or immediate intervention beyond what has been performed at this time.  They will follow up with PCP. We also discussed returning to the ED immediately if new or worsening sx occur. We discussed the sx which are most concerning (e.g., sudden worsening pain, fever, inability to tolerate by mouth) that necessitate immediate return. Medications administered to the patient during their visit and any new  prescriptions provided to the patient are listed below.  Medications given during this visit Medications  potassium chloride (KLOR-CON) packet 60 mEq (60 mEq Oral Given 07/22/23 1219)  furosemide (LASIX) injection 40 mg (40 mg Intravenous Given 07/22/23 1218)     The patient appears reasonably screen and/or stabilized for discharge and I doubt any other medical condition or other [facility] requiring further screening, evaluation, or treatment in the ED at this time prior to discharge.          Final Clinical Impression(s) / ED Diagnoses Final diagnoses:  Bilateral lower extremity edema    Rx / DC Orders ED Discharge Orders     None         Melene Plan, DO 07/22/23 1239    Melene Plan, DO 07/22/23 1239

## 2023-07-22 NOTE — Telephone Encounter (Signed)
 Copied from CRM [id number]. Topic: Clinical - Medical Advice >> Jul 22, 2023  3:05 PM Chantha C wrote: Reason for CRM: Patient swollen left hip and legs was swollen and daughter Aurther Loft [phone] went to the ER today, furosemide was given. Aurther Loft asked if Dr. Casimiro Needle to review ED after summary visit for today. Patient asked continue with torsemide (DEMADEX) 5 MG tablet and how to take it- needs clarification? Patient has no more refills. Please advise and call back today [phone].   [facility] [address], [place] - [address] [place] [address] Phone:[phone]

## 2023-07-22 NOTE — Discharge Instructions (Signed)
 Your lab work looks okay.  I am going to give you a bolus dose of Lasix here to try to improve your swelling.  Your potassium was mildly low I gave you a dose of potassium here.  I would have you keep your appointment with your doctor today.

## 2023-07-22 NOTE — Telephone Encounter (Signed)
 Patient informed of the message below.

## 2023-07-22 NOTE — Telephone Encounter (Signed)
 I sent in the refills-- the instructions are on the script.

## 2023-07-22 NOTE — ED Notes (Signed)
 Dc instructions reviewed with patient. Patient voiced understanding. Dc with belongings.

## 2023-07-22 NOTE — Telephone Encounter (Signed)
 Patient was scheduled for an appt with Dr Casimiro Needle today at 3pm.

## 2023-07-22 NOTE — Telephone Encounter (Signed)
 Noted- ok to close.

## 2023-07-23 ENCOUNTER — Telehealth: Payer: Self-pay

## 2023-07-23 NOTE — Transitions of Care (Post Inpatient/ED Visit) (Signed)
   07/23/2023  Name: KRISLYNN GRONAU MRN: [id number] DOB: May 28, 1919  Today's TOC FU Call Status: Unsuccessful Call (1st Attempt) Date: 07/23/23  Attempted to reach the patient regarding the most recent Inpatient/ED visit.  Follow Up Plan: Additional outreach attempts will be made to reach the patient to complete the Transitions of Care (Post Inpatient/ED visit) call.   Signature Karena Addison, LPN [facility] Nurse Health Advisor Direct Dial [phone]

## 2023-07-26 ENCOUNTER — Ambulatory Visit: Admitting: Family Medicine

## 2023-07-26 ENCOUNTER — Encounter: Payer: Self-pay | Admitting: Family Medicine

## 2023-07-26 VITALS — BP 102/70 | HR 59 | Temp 97.6°F | Ht 61.0 in | Wt 107.4 lb

## 2023-07-26 DIAGNOSIS — R600 Localized edema: Secondary | ICD-10-CM | POA: Diagnosis not present

## 2023-07-26 DIAGNOSIS — E876 Hypokalemia: Secondary | ICD-10-CM

## 2023-07-26 MED ORDER — TORSEMIDE 5 MG PO TABS: 10.0000 mg | ORAL_TABLET | Freq: Two times a day (BID) | ORAL | 3 refills | Status: DC

## 2023-07-26 MED ORDER — POTASSIUM CHLORIDE CRYS ER 20 MEQ PO TBCR: 20.0000 meq | EXTENDED_RELEASE_TABLET | Freq: Every day | ORAL | 1 refills | Status: AC

## 2023-07-26 NOTE — Progress Notes (Signed)
 Established Patient Office Visit  Subjective   Patient ID: Heidi Moore, female    DOB: Sep 02, 1919  Age: 89 y.o. MRN: [id number]  Chief Complaint  Patient presents with   Hospitalization Follow-up    Pt is here for ED follow up. She went to the ED for increasing swelling in her legs. She had a low potassium level and this was treated with 40 mg furosemide and sent home. She reports no chest pain, no SOB, no dizziness or other symptoms, she continues to be concerned about the swelling in her legs. I have counseled her on the swelling and that it will not go away completely with the diuretics, but that as long as it is under control then we will just monitor. She has been seen multiple times by myself and the ED for this problem. States she gets scared when her swelling gets worse and that's why she returns to the ED.     Current Outpatient Medications  Medication Instructions   acetaminophen (TYLENOL) 650 mg, Every 6 hours PRN   acetic acid-hydrocortisone (VOSOL-HC) OTIC solution 3 drops, Both EARS, 2 times daily   Ascorbic Acid (VITAMIN C PO) 500 mg, 2 times daily   cholecalciferol (VITAMIN D) 1,000 Units, 2 times daily   Cyanocobalamin (VITAMIN B 12 PO) Daily   estradiol (CLIMARA - DOSED IN MG/24 HR) 0.1 mg, Transdermal, Weekly   fexofenadine (ALLEGRA ALLERGY CHILDRENS) 30 MG/5ML suspension 1/4 tsp daily   OVER THE COUNTER MEDICATION 2 times daily   potassium chloride (KLOR-CON M) 20 MEQ tablet 20 mEq, Oral, Daily   torsemide (DEMADEX) 10 mg, Oral, 2 times daily   triamcinolone (NASACORT) 55 MCG/ACT AERO nasal inhaler 1 spray, Nasal, Daily   triamcinolone cream (KENALOG) 0.1 % 1 Application, Topical, 2 times daily   VITAMIN A PO Daily   vitamin E 400 UNIT capsule 1 capsule, Daily    Patient Active Problem List   Diagnosis Date Noted   Menopausal symptoms 06/07/2023   Hypercoagulable state due to persistent atrial fibrillation (HCC) 02/02/2023   Synovitis of right knee  11/10/2022   Atrial fibrillation (HCC) 11/09/2022   Chronic eczematous otitis externa of both ears 05/25/2022   Anxiety 05/25/2022   Dizziness 12/12/2021   Dysuria 12/04/2021   Idiopathic polyneuropathy 12/04/2021   Hypothyroidism 12/04/2021   Optic atrophy of both eyes 12/10/2020   Intermediate stage nonexudative age-related macular degeneration of both eyes 05/21/2020   Primary open angle glaucoma of both eyes, severe stage 05/21/2020   Scarring, chorioretinal, 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 Georgianne Fick 08/24/2012   LOW BACK PAIN SYNDROME 05/07/2010   Vitamin D deficiency 04/22/2009   Allergic rhinitis 03/12/2008   Hyperlipemia 10/22/2006      Review of Systems  All other systems reviewed and are negative.     Objective:     BP 102/70   Pulse (!) 59   Temp 97.6 F (36.4 C) (Oral)   Ht 5\' 1"  (1.549 m)   Wt 107 lb 6.4 oz (48.7 kg)   SpO2 98%   BMI 20.29 kg/m    Physical Exam Vitals reviewed.  Constitutional:      Appearance: Normal appearance. She is normal weight.  Cardiovascular:     Rate and Rhythm: Normal rate. Rhythm irregular.     Pulses: Normal pulses.  Heart sounds: Normal heart sounds. No murmur heard. Pulmonary:     Effort: Pulmonary effort is normal.     Breath sounds: Normal breath sounds. No wheezing or rales.  Musculoskeletal:     Right lower leg: Edema (edema is worse on the right, up to the knee, she also has dependent edema in the posterior thighs) present.     Left lower leg: Edema present.  Neurological:     Mental Status: She is alert and oriented to person, place, and time. Mental status is at baseline.  Psychiatric:        Mood and Affect: Mood normal.        Behavior: Behavior normal.      No results found for any visits on 07/26/23.    The ASCVD Risk score (Arnett DK,  et al., 2019) failed to calculate for the following reasons:   The 2019 ASCVD risk score is only valid for ages 73 to 60    Assessment & Plan:  Hypokalemia -     Basic metabolic panel; Future  Lower extremity edema -     Potassium Chloride [facility] ER; Take 1 tablet (20 mEq total) by mouth daily.  Dispense: 90 tablet; Refill: 1 -     Torsemide; Take 2 tablets (10 mg total) by mouth 2 (two) times daily.  Dispense: 120 tablet; Refill: 3 -     Basic metabolic panel; Future   Has follow up with me in August. We will increase to 4 tablets dialy of the torsemide and also she will need to increase the potassium to 20 MEQ daily. We will check another BMP in 2 weeks to look at the potassium level.   No follow-ups on file.    Karie Georges, MD

## 2023-07-26 NOTE — Patient Instructions (Addendum)
 May take an extra torsemide if there is extra swelling -- may take up to 4 tablets a day of the torsemide  Please take 20 MEQ of potassium daily from now on.

## 2023-07-27 NOTE — Transitions of Care (Post Inpatient/ED Visit) (Signed)
   07/27/2023  Name: Heidi Moore MRN: [id number] DOB: Jun 03, 1919  Today's TOC FU Call Status: Unsuccessful Call (1st Attempt) Date: 07/23/23  Attempted to reach the patient regarding the most recent Inpatient/ED visit.  Follow Up Plan: No further outreach attempts will be made at this time. We have been unable to contact the patient. Patient already seen in office Karena Addison, LPN [facility] Nurse Health Advisor Direct Dial [phone]  Signature  Karena Addison, LPN [facility] Nurse Health Advisor Direct Dial [phone]

## 2023-08-02 ENCOUNTER — Ambulatory Visit: Payer: Medicare PPO | Admitting: Endocrinology

## 2023-08-10 ENCOUNTER — Other Ambulatory Visit (INDEPENDENT_AMBULATORY_CARE_PROVIDER_SITE_OTHER)

## 2023-08-10 DIAGNOSIS — R600 Localized edema: Secondary | ICD-10-CM | POA: Diagnosis not present

## 2023-08-10 DIAGNOSIS — E876 Hypokalemia: Secondary | ICD-10-CM

## 2023-08-10 LAB — BASIC METABOLIC PANEL WITH GFR
BUN: 23 mg/dL (ref 6–23)
CO2: 29 meq/L (ref 19–32)
Calcium: 9.7 mg/dL (ref 8.4–10.5)
Chloride: 101 meq/L (ref 96–112)
Creatinine, Ser: 1.09 mg/dL (ref 0.40–1.20)
GFR: 41.48 mL/min — ABNORMAL LOW (ref >60.00)
Glucose, Bld: 80 mg/dL (ref 70–99)
Potassium: 3.5 meq/L (ref 3.5–5.1)
Sodium: 139 meq/L (ref 135–145)

## 2023-08-17 DIAGNOSIS — M9904 Segmental and somatic dysfunction of sacral region: Secondary | ICD-10-CM | POA: Diagnosis not present

## 2023-08-17 DIAGNOSIS — M5136 Other intervertebral disc degeneration, lumbar region with discogenic back pain only: Secondary | ICD-10-CM | POA: Diagnosis not present

## 2023-08-17 DIAGNOSIS — M9903 Segmental and somatic dysfunction of lumbar region: Secondary | ICD-10-CM | POA: Diagnosis not present

## 2023-08-17 DIAGNOSIS — M9905 Segmental and somatic dysfunction of pelvic region: Secondary | ICD-10-CM | POA: Diagnosis not present

## 2023-08-31 ENCOUNTER — Ambulatory Visit

## 2023-08-31 ENCOUNTER — Ambulatory Visit: Admitting: Family Medicine

## 2023-08-31 ENCOUNTER — Encounter: Payer: Self-pay | Admitting: Family Medicine

## 2023-08-31 ENCOUNTER — Ambulatory Visit: Payer: Self-pay

## 2023-08-31 VITALS — BP 100/62 | HR 58 | Temp 98.6°F | Ht 61.0 in | Wt 98.4 lb

## 2023-08-31 DIAGNOSIS — R110 Nausea: Secondary | ICD-10-CM

## 2023-08-31 DIAGNOSIS — R109 Unspecified abdominal pain: Secondary | ICD-10-CM | POA: Diagnosis not present

## 2023-08-31 DIAGNOSIS — R197 Diarrhea, unspecified: Secondary | ICD-10-CM | POA: Diagnosis not present

## 2023-08-31 MED ORDER — ONDANSETRON HCL 4 MG PO TABS: 4.0000 mg | ORAL_TABLET | Freq: Three times a day (TID) | ORAL | 0 refills | Status: AC | PRN

## 2023-08-31 NOTE — Telephone Encounter (Signed)
 Copied from CRM [id number]. Topic: Clinical - Red Word Triage >> Aug 31, 2023  8:03 AM Heidi Moore wrote: Red Word that prompted transfer to Nurse Triage: Patient stated she's been experiencing diarrhea, gas and losing weight because she can't keep anything down.   Chief Complaint: Diarrhea  Symptoms: Flatulence  Frequency:  This past Tuesday  Pertinent Negatives: Patient denies fever, vomiting,   Disposition: [] ED /[] Urgent Care (no appt availability in office) / [] Appointment(In office/virtual)/ []  [facility]/ [] Home Care/ [] Refused Recommended Disposition /[]  Mobile Bus/ []  Follow-up with PCP  Additional Notes: BL is being triaged for diarrhea that has been occurring since this past Tuesday.  The patient states she has been experiencing a decreased appetite and some weight loss.  Reason for Disposition  [1] Mild diarrhea (e.g., 1-3 or more stools than normal in past 24 hours) without known cause AND [2] present >  7 days  Answer Assessment - Initial Assessment Questions 1. DIARRHEA SEVERITY: "How bad is the diarrhea?" "How many more stools have you had in the past 24 hours than normal?"    - NO DIARRHEA (SCALE 0)   - MILD (SCALE 1-3): Few loose or mushy BMs; increase of 1-3 stools over normal daily number of stools; mild increase in ostomy output.   -  MODERATE (SCALE 4-7): Increase of 4-6 stools daily over normal; moderate increase in ostomy output.   -  SEVERE (SCALE 8-10; OR "WORST POSSIBLE"): Increase of 7 or more stools daily over normal; moderate increase in ostomy output; incontinence.     2  2. ONSET: "When did the diarrhea begin?"      This past Tuesday  3. BM CONSISTENCY: "How loose or watery is the diarrhea?"      Both  4. VOMITING: "Are you also vomiting?" If Yes, ask: "How many times in the past 24 hours?"      No  5. ABDOMEN PAIN: "Are you having any abdomen pain?" If Yes, ask: "What does it feel like?" (e.g., crampy, dull, intermittent,  constant)      No  6. ABDOMEN PAIN SEVERITY: If present, ask: "How bad is the pain?"  (e.g., Scale 1-10; mild, moderate, or severe)   - MILD (1-3): doesn't interfere with normal activities, abdomen soft and not tender to touch    - MODERATE (4-7): interferes with normal activities or awakens from sleep, abdomen tender to touch    - SEVERE (8-10): excruciating pain, doubled over, unable to do any normal activities       Denies  7. ORAL INTAKE: If vomiting, "Have you been able to drink liquids?" "How much liquids have you had in the past 24 hours?"     No vomiting.  8. HYDRATION: "Any signs of dehydration?" (e.g., dry mouth [not just dry lips], too weak to stand, dizziness, new weight loss) "When did you last urinate?"     Complains of these symptoms related to her fluid pill she takes. Otherwise no.   9. EXPOSURE: "Have you traveled to a foreign country recently?" "Have you been exposed to anyone with diarrhea?" "Could you have eaten any food that was spoiled?"      Daughter has been having symptoms as well  10. ANTIBIOTIC USE: "Are you taking antibiotics now or have you taken antibiotics in the past 2 months?"       No  11. OTHER SYMPTOMS: "Do you have any other symptoms?" (e.g., fever, blood in stool)       No  12. PREGNANCY: "Is there any chance you are pregnant?" "When was your last menstrual period?"       No and No  Protocols used: Diarrhea-A-AH

## 2023-08-31 NOTE — Progress Notes (Signed)
 Acute Office Visit  Subjective:     Patient ID: Heidi Moore, female    DOB: Sep 20, 1919, 89 y.o.   MRN: [id number]  Chief Complaint  Patient presents with   Diarrhea    X6 days, tried OTC anti-diarrheal with no relief   Abdominal Pain    Diarrhea  Associated symptoms include abdominal pain and weight loss.  Abdominal Pain Associated symptoms include diarrhea and weight loss.   Patient is in today for recurrent diarrhea that started about 5 days ago. States that her stomach was grumbling and she wasn't feeling well, had to have multiple BM's.  Thought initially she caught a GI bug and she took antidiarrheal medication OTC. States that the multiple BM's have caused a lot of rectal irritation and burning in her perineal area. States she was also nauseated. States that she is down to 98 pounds and she is worried about her weight loss.  She reports that the diarrhea is maybe getting better, had 1 small stool today so far, states that she feels like she still has to go to the bathroom. States that after she eats she is feeling very nauseated.   No fever or chills, no blood in the stool   Review of Systems  Constitutional:  Positive for weight loss.  Gastrointestinal:  Positive for abdominal pain and diarrhea.  All other systems reviewed and are negative.       Objective:    BP 100/62   Pulse (!) 58   Temp 98.6 F (37 C) (Oral)   Ht 5\' 1"  (1.549 m)   Wt 98 lb 6.4 oz (44.6 kg)   SpO2 98%   BMI 18.59 kg/m     Physical Exam Vitals reviewed.  Constitutional:      Appearance: She is well-developed and normal weight.  Cardiovascular:     Rate and Rhythm: Normal rate and regular rhythm.     Heart sounds: Normal heart sounds.  Pulmonary:     Effort: Pulmonary effort is normal.     Breath sounds: No wheezing.  Abdominal:     General: Bowel sounds are normal.     Palpations: Abdomen is soft. There is no fluid wave.     Tenderness: There is abdominal tenderness (mild  diffuse with mild intensity palpation). There is no guarding or rebound. Negative signs include Murphy's sign.  Neurological:     Mental Status: She is alert.     No results found for any visits on 08/31/23.      Assessment & Plan:   Problem List Items Addressed This Visit   None Visit Diagnoses       Nausea    -  Primary   Relevant Medications   ondansetron  (ZOFRAN ) 4 MG tablet   Other Relevant Orders   DG Abd 2 Views     Acute diarrhea       Relevant Orders   DG Abd 2 Views     Most likely a viral cause, her diarrhea is better but the nausea has remained. Will treat with PRN ondansetron  and I advised her to continue PRN antidiarrheals and fiber, probiotics. Abd is soft on exam, normal bowel sounds, she has mild diffuse tenderness but this is non specific. Will obtain abdominal film to look at the bowel gas pattern.   Meds ordered this encounter  Medications   ondansetron  (ZOFRAN ) 4 MG tablet    Sig: Take 1 tablet (4 mg total) by mouth every 8 (eight) hours as needed for  nausea or vomiting.    Dispense:  20 tablet    Refill:  0    No follow-ups on file.  Aida House, MD

## 2023-08-31 NOTE — Telephone Encounter (Signed)
 Does she need an appointment? They did not check any of the boxes to tell me what they recommneded for her

## 2023-08-31 NOTE — Patient Instructions (Signed)
 Add fiber like metamucil, add probiotics, or active culture yogurt to help make the stools more solid.

## 2023-08-31 NOTE — Telephone Encounter (Signed)
 Patient was scheduled for an appt today at 3:30 with PCP.

## 2023-09-06 ENCOUNTER — Ambulatory Visit: Payer: Self-pay

## 2023-09-06 NOTE — Telephone Encounter (Signed)
 3rd attempt, LVM. Routing to clinic for follow up.

## 2023-09-06 NOTE — Telephone Encounter (Signed)
 2nd attempt, LVM requesting pt return call to office.

## 2023-09-06 NOTE — Telephone Encounter (Signed)
 Copied from CRM [id number]. Topic: Clinical - Pink Word Triage >> Sep 06, 2023  1:46 PM Howard Macho wrote: Reason for CRM: patient called stating the doctor told her to get metamucil, but the metamucil had 25 mg of potassium and she took the medication on Tuesday and later on that night she had diarrhea. Patient stated she had diarrhea on Wednesday and Thursday. Patient stated she took four anti diarrhea pills trying to get the diarrhea to stop patient stated it was soft stool then diarrhea. Patient stated her intestines are still upset and she want to see if she can go off the potassium. Patient stated she took the potassium pill one time. Patient stated she still has nausea and took nausea medication for that. Patient stated she will not be back until 4pm because she has two appointments but she stated the nurse can leave a message her answering machine   09/06/2023 4:09pm: First attempt, no answer LVMT[facility] [phone]

## 2023-09-07 DIAGNOSIS — M9903 Segmental and somatic dysfunction of lumbar region: Secondary | ICD-10-CM | POA: Diagnosis not present

## 2023-09-07 DIAGNOSIS — M9905 Segmental and somatic dysfunction of pelvic region: Secondary | ICD-10-CM | POA: Diagnosis not present

## 2023-09-07 DIAGNOSIS — M9904 Segmental and somatic dysfunction of sacral region: Secondary | ICD-10-CM | POA: Diagnosis not present

## 2023-09-07 DIAGNOSIS — M5136 Other intervertebral disc degeneration, lumbar region with discogenic back pain only: Secondary | ICD-10-CM | POA: Diagnosis not present

## 2023-09-07 NOTE — Telephone Encounter (Signed)
 Spoke with the patient informed her of the message below and to hold the potassium for 3-4 days per PCP.  Also advised her per PCP to take Imodium, begin a probiotic, and to eat bananas, cheese and meat to help.

## 2023-09-07 NOTE — Telephone Encounter (Signed)
 Ok just have her hold the potassium for a few days and then she can restart it.

## 2023-09-09 ENCOUNTER — Ambulatory Visit: Payer: Self-pay

## 2023-09-09 NOTE — Telephone Encounter (Signed)
 Patient informed of the message below.

## 2023-09-09 NOTE — Telephone Encounter (Signed)
 It may be that after her bout of diarrhea there is not much stool in the colon-- I would give it some time to work itself out-- have her continue probiotics and fiber supplements

## 2023-09-09 NOTE — Telephone Encounter (Signed)
 Copied From CRM [id number]. Reason for Triage: Constipation. Patient has not been able to have a BM in 4 days even when trying high-fiber food. Questioning if there is anything else she can do to loosen up her stools. Patient denied having stomach pain.   Chief Complaint: Constipation , last BM 4 days ago. Last week was having diarrhea and took Imodium. Asking PCP what she can take. Symptoms: Above Frequency: abdominal pain Pertinent Negatives: Patient denies  Disposition: [] ED /[] Urgent Care (no appt availability in office) / [] Appointment(In office/virtual)/ []  [facility]/ [x] Home Care/ [] Refused Recommended Disposition /[] [facility]/ [x]  Follow-up with PCP Additional Notes: Reviewed Home Care, drink fluids, eat prunes, apple sauce, etc. Reason for Disposition  Unable to have a bowel movement (BM) without laxative or enema  Answer Assessment - Initial Assessment Questions 1. STOOL PATTERN OR FREQUENCY: "How often do you have a bowel movement (BM)?"  (Normal range: 3 times a day to every 3 days)  "When was your last BM?"       Last Friday 2. STRAINING: "Do you have to strain to have a BM?"      no 3. RECTAL PAIN: "Does your rectum hurt when the stool comes out?" If Yes, ask: "Do you have hemorrhoids? How bad is the pain?"  (Scale 1-10; or mild, moderate, severe)     no 4. STOOL COMPOSITION: "Are the stools hard?"      yes 5. BLOOD ON STOOLS: "Has there been any blood on the toilet tissue or on the surface of the BM?" If Yes, ask: "When was the last time?"     no 6. CHRONIC CONSTIPATION: "Is this a new problem for you?"  If No, ask: "How long have you had this problem?" (days, weeks, months)      no 7. CHANGES IN DIET OR HYDRATION: "Have there been any recent changes in your diet?" "How much fluids are you drinking on a daily basis?"  "How much have you had to drink today?"     Had diarrhea last week and took Imodium  8. MEDICINES: "Have you been taking any new  medicines?" "Are you taking any narcotic pain medicines?" (e.g., Dilaudid , morphine , Percocet, Vicodin)     No 9. LAXATIVES: "Have you been using any stool softeners, laxatives, or enemas?"  If Yes, ask "What, how often, and when was the last time?"     No 10. ACTIVITY:  "How much walking do you do every day?"  "Has your activity level decreased in the past week?"        No 11. CAUSE: "What do you think is causing the constipation?"        Unsure 12. OTHER SYMPTOMS: "Do you have any other symptoms?" (e.g., abdomen pain, bloating, fever, vomiting)       No 13. MEDICAL HISTORY: "Do you have a history of hemorrhoids, rectal fissures, or rectal surgery or rectal abscess?"         No 14. PREGNANCY: "Is there any chance you are pregnant?" "When was your last menstrual period?"       No  Protocols used: Constipation-A-AH

## 2023-10-12 DIAGNOSIS — H353133 Nonexudative age-related macular degeneration, bilateral, advanced atrophic without subfoveal involvement: Secondary | ICD-10-CM | POA: Diagnosis not present

## 2023-10-12 DIAGNOSIS — H04123 Dry eye syndrome of bilateral lacrimal glands: Secondary | ICD-10-CM | POA: Diagnosis not present

## 2023-10-12 DIAGNOSIS — H52203 Unspecified astigmatism, bilateral: Secondary | ICD-10-CM | POA: Diagnosis not present

## 2023-10-12 DIAGNOSIS — Z961 Presence of intraocular lens: Secondary | ICD-10-CM | POA: Diagnosis not present

## 2023-10-12 DIAGNOSIS — H524 Presbyopia: Secondary | ICD-10-CM | POA: Diagnosis not present

## 2023-10-12 DIAGNOSIS — H401133 Primary open-angle glaucoma, bilateral, severe stage: Secondary | ICD-10-CM | POA: Diagnosis not present

## 2023-10-26 DIAGNOSIS — M5136 Other intervertebral disc degeneration, lumbar region with discogenic back pain only: Secondary | ICD-10-CM | POA: Diagnosis not present

## 2023-10-26 DIAGNOSIS — M9905 Segmental and somatic dysfunction of pelvic region: Secondary | ICD-10-CM | POA: Diagnosis not present

## 2023-10-26 DIAGNOSIS — M9904 Segmental and somatic dysfunction of sacral region: Secondary | ICD-10-CM | POA: Diagnosis not present

## 2023-10-26 DIAGNOSIS — M9903 Segmental and somatic dysfunction of lumbar region: Secondary | ICD-10-CM | POA: Diagnosis not present

## 2023-10-28 ENCOUNTER — Encounter: Payer: Self-pay | Admitting: Family Medicine

## 2023-10-28 ENCOUNTER — Ambulatory Visit: Admitting: Family Medicine

## 2023-10-28 VITALS — BP 98/64 | HR 60 | Temp 98.0°F | Wt 107.6 lb

## 2023-10-28 DIAGNOSIS — R5383 Other fatigue: Secondary | ICD-10-CM | POA: Diagnosis not present

## 2023-10-28 DIAGNOSIS — Z131 Encounter for screening for diabetes mellitus: Secondary | ICD-10-CM | POA: Diagnosis not present

## 2023-10-28 NOTE — Progress Notes (Unsigned)
 Established Patient Office Visit  Subjective   Patient ID: Heidi Moore, female    DOB: 1919-09-05  Age: 89 y.o. MRN: [id number]  Chief Complaint  Patient presents with   Fatigue   Edema    Pt reports she is here because she is generally not feeling good. States that she might be under stress right now because her son was recently diagnosed with colon cancer. She reports she has stopped taking the potassium supplements because she is eating more bananas, yogurt and pecans which are higher and was worried that her potassium levels were off. States that her swelling is still there, some days are better than others. States she thinks she needs her potassium level checked since she has been off the potassium for a couple of weeks now.   She reports she is sleeping more and feels more tired, howevever she has been taking the torsemide  1 tablet every 6 hours so sometimes she wakes up to urinate a lot.     Current Outpatient Medications  Medication Instructions   acetaminophen  (TYLENOL ) 650 mg, Every 6 hours PRN   acetic acid -hydrocortisone  (VOSOL -HC) OTIC solution 3 drops, Both EARS, 2 times daily   Ascorbic Acid  (VITAMIN C  PO) 500 mg, 2 times daily   cholecalciferol  (VITAMIN D ) 1,000 Units, 2 times daily   Cyanocobalamin  (VITAMIN B 12 PO) Daily   estradiol  (CLIMARA  - DOSED IN MG/24 HR) 0.1 mg, Transdermal, Weekly   fexofenadine (ALLEGRA  ALLERGY  CHILDRENS) 30 MG/5ML suspension 1/4 tsp daily   ondansetron  (ZOFRAN ) 4 mg, Oral, Every 8 hours PRN   OVER THE COUNTER MEDICATION 2 times daily   potassium chloride  (KLOR-CON  M) 20 MEQ tablet 20 mEq, Oral, Daily   torsemide  (DEMADEX ) 10 mg, Oral, 2 times daily   triamcinolone  (NASACORT ) 55 MCG/ACT AERO nasal inhaler 1 spray, Nasal, Daily   triamcinolone  cream (KENALOG ) 0.1 % 1 Application, Topical, 2 times daily   VITAMIN A PO Daily   vitamin E 400 UNIT capsule 1 capsule, Daily    {History (Optional):23778}  Review of Systems  All  other systems reviewed and are negative.     Objective:     BP 98/64 (BP Location: Right Arm, Patient Position: Sitting, Cuff Size: Normal)   Pulse 60   Temp 98 F (36.7 C) (Oral)   Wt 107 lb 9.6 oz (48.8 kg)   SpO2 97%   BMI 20.33 kg/m  {Vitals History (Optional):23777}  Physical Exam Vitals reviewed.  Constitutional:      Appearance: Normal appearance. She is normal weight.   Cardiovascular:     Rate and Rhythm: Normal rate and regular rhythm.     Heart sounds: Normal heart sounds.  Pulmonary:     Effort: Pulmonary effort is normal.     Breath sounds: No wheezing.   Neurological:     Mental Status: She is alert and oriented to person, place, and time. Mental status is at baseline.   Psychiatric:        Mood and Affect: Mood normal.        Behavior: Behavior normal.      No results found for any visits on 10/28/23.  {Labs (Optional):23779}  The ASCVD Risk score (Arnett DK, et al., 2019) failed to calculate for the following reasons:   The 2019 ASCVD risk score is only valid for ages 57 to 65    Assessment & Plan:  Other fatigue -     Basic metabolic panel with GFR; Future  Diabetes mellitus screening -  Hemoglobin A1c; Future     No follow-ups on file.    Aida House, MD

## 2023-11-01 ENCOUNTER — Other Ambulatory Visit

## 2023-11-01 ENCOUNTER — Ambulatory Visit: Admitting: Podiatry

## 2023-11-01 ENCOUNTER — Encounter: Payer: Self-pay | Admitting: Podiatry

## 2023-11-01 VITALS — Ht 61.0 in | Wt 107.0 lb

## 2023-11-01 DIAGNOSIS — B351 Tinea unguium: Secondary | ICD-10-CM | POA: Diagnosis not present

## 2023-11-01 DIAGNOSIS — M79675 Pain in left toe(s): Secondary | ICD-10-CM | POA: Diagnosis not present

## 2023-11-01 DIAGNOSIS — M79674 Pain in right toe(s): Secondary | ICD-10-CM

## 2023-11-01 DIAGNOSIS — L84 Corns and callosities: Secondary | ICD-10-CM

## 2023-11-02 ENCOUNTER — Other Ambulatory Visit (INDEPENDENT_AMBULATORY_CARE_PROVIDER_SITE_OTHER)

## 2023-11-02 DIAGNOSIS — Z131 Encounter for screening for diabetes mellitus: Secondary | ICD-10-CM | POA: Diagnosis not present

## 2023-11-02 DIAGNOSIS — R5383 Other fatigue: Secondary | ICD-10-CM

## 2023-11-02 LAB — BASIC METABOLIC PANEL WITH GFR
BUN: 16 mg/dL (ref 6–23)
CO2: 29 meq/L (ref 19–32)
Calcium: 9.4 mg/dL (ref 8.4–10.5)
Chloride: 103 meq/L (ref 96–112)
Creatinine, Ser: 0.91 mg/dL (ref 0.40–1.20)
GFR: 51.42 mL/min — ABNORMAL LOW (ref >60.00)
Glucose, Bld: 95 mg/dL (ref 70–99)
Potassium: 3.3 meq/L — ABNORMAL LOW (ref 3.5–5.1)
Sodium: 138 meq/L (ref 135–145)

## 2023-11-02 LAB — HEMOGLOBIN A1C: Hgb A1c MFr Bld: 5.6 % (ref 4.6–6.5)

## 2023-11-02 NOTE — Progress Notes (Signed)
 Subjective:   Patient ID: Heidi Moore, female   DOB: 89 y.o.   MRN: [id number]   HPI Patient presents with caregiver with 2 different problems with 1 being painful lesions on the bottom of the foot and also thick nailbeds that they cannot cut.  Patient is 89 years old and I was able to hold a conversation with her and she does not smoke is not active   Review of Systems  All other systems reviewed and are negative.       Objective:  Physical Exam Vitals and nursing note reviewed.  Constitutional:      Appearance: She is well-developed.  Pulmonary:     Effort: Pulmonary effort is normal.   Musculoskeletal:        General: Normal range of motion.   Skin:    General: Skin is warm.   Neurological:     Mental Status: She is alert.     Vascular status was moderately diminished but intact for her age neurologically she is intact muscle strength diminished range of motion diminished but commensurate with age with patient found to have 2 painful keratotic lesions left foot subthird metatarsal and thick yellow brittle nailbeds 1-5 both feet that she cannot cut.  Good digital perfusion was noted and she is well-oriented for her age     Assessment:  Chronic mycotic nail infection with pain 1-5 both feet that they cannot take care of and lesions left painful     Plan:  H&P all conditions reviewed and I went ahead today I carefully debrided nailbeds 1-5 both feet no iatrogenic bleeding and lesions left no iatrogenic bleeding and will return for normal visit or earlier if needed

## 2023-11-04 ENCOUNTER — Ambulatory Visit: Payer: Self-pay | Admitting: Family Medicine

## 2023-11-08 ENCOUNTER — Ambulatory Visit: Payer: Self-pay

## 2023-11-08 NOTE — Telephone Encounter (Signed)
 If she is having depression symptoms please have schedule an appointment with me to discuss. If the symptoms resolve within 2 weeks then she does not need an appointment.

## 2023-11-08 NOTE — Telephone Encounter (Signed)
 Spoke with the patient and scheduled a phone visit on 7/7.

## 2023-11-08 NOTE — Telephone Encounter (Signed)
 FYI Only or Action Required?: Action required by provider: clinical question for provider and update on patient condition.  Patient was last seen in primary care on 10/28/2023 by Ozell Heron HERO, MD. Called Nurse Triage reporting No chief complaint on file.. Symptoms began a week ago. Interventions attempted: Rest, hydration, or home remedies. Symptoms are: gradually worsening.  Triage Disposition: Call PCP When Office is Open  Patient/caregiver understands and will follow disposition?: Yes  Copied from CRM [id number]. Topic: Clinical - Red Word Triage >> Nov 08, 2023 11:42 AM Corin V wrote: [facility] that prompted transfer to Nurse Triage: Patient is having adverse side effect to the estradiol  (CLIMARA  - DOSED IN MG/24 HR) 0.1 mg/24hr patch. She is constantly crying and bursting into tears like when she had her hysterectomy at 57. Reason for Disposition  [1] Caller has NON-URGENT medicine question about med that PCP prescribed AND [2] triager unable to answer question  Depression, questions about  Answer Assessment - Initial Assessment Questions 1. NAME of MEDICINE: What medicine(s) are you calling about?     hormone 2. QUESTION: What is your question? (e.g., double dose of medicine, side effect)     Is my hormone dose strong enough.  3. PRESCRIBER: Who prescribed the medicine? Reason: if prescribed by specialist, call should be referred to that group.     [name] 0.1mg  estradiol  patch Q Saturday.  4. SYMPTOMS: Do you have any symptoms? If Yes, ask: What symptoms are you having?  How bad are the symptoms (e.g., mild, moderate, severe)     Stress, tearful since started the patch. And worsening. She had these same symptoms before starting hormone supplement 50 years ago.    Additional info: Hysterectomy at age 77. Has been taking the same 0.5 mg hormone pill for 50 years.  She would like to go back on the hormone pill instead of patch. Request to please have [name] call  her back with plan.  Answer Assessment - Initial Assessment Questions 1. CONCERN: What happened that made you call today?     Crying, feeling depressed 2. DEPRESSION SYMPTOM SCREENING: How are you feeling overall? (e.g., decreased energy, increased sleeping or difficulty sleeping, difficulty concentrating, feelings of sadness, guilt, hopelessness, or worthlessness)     Feeling sad  3. RISK OF HARM - SUICIDAL IDEATION:  Do you ever have thoughts of hurting or killing yourself?  (e.g., yes, no, no but preoccupation with thoughts about death)   - INTENT:  Do you have thoughts of hurting or killing yourself right NOW? (e.g., yes, no, N/A)   - PLAN: Do you have a specific plan for how you would do this? (e.g., gun, knife, overdose, no plan, N/A)     Denies  4. RISK OF HARM - HOMICIDAL IDEATION:  Do you ever have thoughts of hurting or killing someone else?  (e.g., yes, no, no but preoccupation with thoughts about death)   - INTENT:  Do you have thoughts of hurting or killing someone right NOW? (e.g., yes, no, N/A)   - PLAN: Do you have a specific plan for how you would do this? (e.g., gun, knife, no plan, N/A)      Denies  5. FUNCTIONAL IMPAIRMENT: How have things been going for you overall? Have you had more difficulty than usual doing your normal daily activities?  (e.g., better, same, worse; self-care, school, work, interactions)     Difficulty with financial the other day, received help from relative 6. SUPPORT: Who is with you  now? Who do you live with? Do you have family or friends who you can talk to?      Family  7. OTHER: Do you have any other physical symptoms right now? (e.g., fever)       Blurry vision, new glasses  Protocols used: Medication Question Call-A-AH, Depression-A-AH

## 2023-11-15 ENCOUNTER — Telehealth: Admitting: Family Medicine

## 2023-11-15 ENCOUNTER — Encounter: Payer: Self-pay | Admitting: Family Medicine

## 2023-11-15 DIAGNOSIS — E876 Hypokalemia: Secondary | ICD-10-CM

## 2023-11-15 DIAGNOSIS — N951 Menopausal and female climacteric states: Secondary | ICD-10-CM

## 2023-11-15 MED ORDER — ESTRADIOL 0.25 MG/0.25GM TD GEL: 0.2500 mg | Freq: Every day | TRANSDERMAL | 5 refills | Status: DC

## 2023-11-15 NOTE — Progress Notes (Addendum)
 Virtual Medical Office Visit  Patient:  Heidi Moore      Age: 89 y.o.       Sex:  female  Date:   11/17/2023  PCP:    Ozell Heron HERO, MD   Today's Healthcare Provider: Heron HERO Ozell, MD    Assessment/Plan:   Summary assessment:  [name] was seen today for depression and medication problem.  Menopausal symptoms -     Estradiol ; Place 0.25 mg onto the skin daily.  Dispense: 30 each; Refill: 5  Hypokalemia -     Basic metabolic panel with GFR; Future   Pt insists that it is being on the lower dose of estrogen that is causing her depression symptoms,even though this change was done 6 months ago and she only started reporting depression symptoms about 2 weeks ago. She has had multiple visits with me since January and didn't mention any depression symptoms at those visits.  she also had a history of mild hypothyroidism but could not tolerate the levothyroxine  due to dizziness and blurred vision which seem to be chronic symptoms for her. Patient states she understands the risks of continued use of estrogen and she is asking to go back up on the dose, will go up to 0.25 mg daily. Script sent.   Pt also is concerned about her potassium level. I advised that she can reduce her diuretics to 3 times daily and this will help with her potassium level, will recheck her level in 1 month. She reports she cannot tolerate the larger potassium supplements and so has been trying to eat a diet rich in potassium instead  I spent 20 minutes on the phone with patient today counseling her on her depression symptoms, discussing potassium replacement and recommendations for her diuretics.  No follow-ups on file.   She was advised to call the office or go to ER if her condition worsens    Subjective:   Heidi Moore is a 89 y.o. female with PMH significant for: Past Medical History:  Diagnosis Date   A-fib (HCC)    AC (acromioclavicular) joint bone spurs    on right shoulder    Allergic  rhinitis    Arthritis    Bursitis    right shoulder   Complication of anesthesia    headaches   Diverticulosis    DIVERTICULOSIS, COLON 10/15/2006   Qualifier: Diagnosis of  By: Tish MD, Elsie Slack problem    treated by Dr Lelon per patient   Yzjijryz(215.9)    sinus HA   History of blood transfusion 68yrs ago   no abnormal reaction noted   History of cystitis    History of skin cancer    Basal cell, Dr. Dempsey Dials   Hyperlipidemia    [place] Study LDL goal =<160    Hypotension    Joint pain    Joint swelling    Macular degeneration, dry    Osteopenia    Intolerance to Calcium ,Actonel,Fosamax,Evista, Forteo: S/P Boniva 2007 to 03/2010   OSTEOPENIA 03/12/2008   Qualifier: Diagnosis of  By: Everet Pebbles     Osteoporosis    PONV (postoperative nausea and vomiting)    Rosacea 04/22/2009   Qualifier: Diagnosis of  By: Tish MD, William     Skin cancer 06/22/2017   per patient after excision right calf by Dr Cary   SKIN CANCER, HX OF 03/12/2008   Qualifier: Diagnosis of  By: Everet Pebbles     Urinary  frequency    Vaginal atrophy    sees Dr. Evangeline     Presenting today with: Chief Complaint  Patient presents with   Depression    Patient feels the hormone pill has made her depressed   Medication Problem    Patient complains of dizziness and blurred vision for the past few years after starting Torsemide , states she does have macular degeneration     She clarifies and reports that her condition: Patient is reporting a change in her mood status. States that she thinks it was the decrease in her estrogen therapy.  We had decreased her dose to 0.1 mg patches in January. States that she has been feeling this way for a while, didn't mention it in previous visits. States that she hasn't had any other symptoms like hot flashes or nights sweats, no weight gain. States this happened to her every time they have tried to take her off of the estrogen and she wants  to go back up on her dose. Patient states she understands the risk associated with staying on the estrogen including the risk of stroke, DVT,etc.  Pt is reporting that she is reacting to the potassium, states that it is causing burning and itching in her skin, no rashes however. States that the fluid pill continues to cause blurry vision and dizziness. States she went to the eye doctor and got a different prescription for her glasses however she is still having trouble.         Objective/Observations  Physical Exam:  Voice is clear, stable tremor in the voice, became emotional on the phone call and was crying.   No images are attached to the encounter or orders placed in the encounter.    Results: No results found for any visits on 11/15/23.   Recent Results (from the past 2160 hours)  Hemoglobin A1c     Status: None   Collection Time: 11/02/23  1:59 PM  Result Value Ref Range   Hgb A1c MFr Bld 5.6 4.6 - 6.5 %    Comment: Glycemic Control Guidelines for People with Diabetes:Non Diabetic:  <6%Goal of Therapy: <7%Additional Action Suggested:  >8%   Basic Metabolic Panel     Status: Abnormal   Collection Time: 11/02/23  1:59 PM  Result Value Ref Range   Sodium 138 135 - 145 mEq/L   Potassium 3.3 (L) 3.5 - 5.1 mEq/L   Chloride 103 96 - 112 mEq/L   CO2 29 19 - 32 mEq/L   Glucose, Bld 95 70 - 99 mg/dL   BUN 16 6 - 23 mg/dL   Creatinine, Ser 9.08 0.40 - 1.20 mg/dL   GFR 48.57 (L) >39.99 mL/min    Comment: Calculated using the CKD-EPI Creatinine Equation (2021)   Calcium  9.4 8.4 - 10.5 mg/dL           Virtual Visit via Video   I connected with Heidi Moore on 11/17/23 at  2:30 PM EDT by a video enabled telemedicine application and verified that I am speaking with the correct person using two identifiers. The limitations of evaluation and management by telemedicine and the availability of in person appointments were discussed. The patient expressed understanding and agreed to  proceed.   Percentage of appointment time on audio:  100% Patient location: Home Provider location: [facility] Persons participating in the virtual visit: Myself and Patient

## 2023-11-17 DIAGNOSIS — M9905 Segmental and somatic dysfunction of pelvic region: Secondary | ICD-10-CM | POA: Diagnosis not present

## 2023-11-17 DIAGNOSIS — M9903 Segmental and somatic dysfunction of lumbar region: Secondary | ICD-10-CM | POA: Diagnosis not present

## 2023-11-17 DIAGNOSIS — M5136 Other intervertebral disc degeneration, lumbar region with discogenic back pain only: Secondary | ICD-10-CM | POA: Diagnosis not present

## 2023-11-17 DIAGNOSIS — M9904 Segmental and somatic dysfunction of sacral region: Secondary | ICD-10-CM | POA: Diagnosis not present

## 2023-11-24 DIAGNOSIS — M9903 Segmental and somatic dysfunction of lumbar region: Secondary | ICD-10-CM | POA: Diagnosis not present

## 2023-11-24 DIAGNOSIS — M5136 Other intervertebral disc degeneration, lumbar region with discogenic back pain only: Secondary | ICD-10-CM | POA: Diagnosis not present

## 2023-11-24 DIAGNOSIS — M9904 Segmental and somatic dysfunction of sacral region: Secondary | ICD-10-CM | POA: Diagnosis not present

## 2023-11-24 DIAGNOSIS — M9905 Segmental and somatic dysfunction of pelvic region: Secondary | ICD-10-CM | POA: Diagnosis not present

## 2023-12-07 DIAGNOSIS — M9904 Segmental and somatic dysfunction of sacral region: Secondary | ICD-10-CM | POA: Diagnosis not present

## 2023-12-07 DIAGNOSIS — M9903 Segmental and somatic dysfunction of lumbar region: Secondary | ICD-10-CM | POA: Diagnosis not present

## 2023-12-07 DIAGNOSIS — M9905 Segmental and somatic dysfunction of pelvic region: Secondary | ICD-10-CM | POA: Diagnosis not present

## 2023-12-07 DIAGNOSIS — M5136 Other intervertebral disc degeneration, lumbar region with discogenic back pain only: Secondary | ICD-10-CM | POA: Diagnosis not present

## 2023-12-10 ENCOUNTER — Ambulatory Visit: Payer: Self-pay

## 2023-12-10 NOTE — Telephone Encounter (Signed)
 FYI Only or Action Required?: Action required by provider: request for appointment and Wants labs to check potassium level, to look for kidney problems and check her blood sugar. Wants to be seen Monday afternoon.  Patient was last seen in primary care on 11/15/2023 by Heidi Moore HERO, MD.  Called Nurse Triage reporting Advice Only.  Symptoms began after reduction of water pills. Not urinating as much.  Interventions attempted: Other: calling PCP for sooner lab appt..  Symptoms are: stable.  Triage Disposition: Call PCP When Office is Open  Patient/caregiver understands and will follow disposition?: yes - pt will need to be called back regarding appt for Monday afternoon.                 Copied from CRM [id number]. Topic: Clinical - Red Word Triage >> Dec 10, 2023  4:22 PM [place] J wrote: [facility] that prompted transfer to Nurse Triage: Patient has not been able to urinate properly and she is sore and aching below her waist, and on her hips. She mentioned wanting to see someone and get blood work done so she can make sure she does not have a kidney infection. Reason for Disposition  [1] Caller requesting NON-URGENT health information AND [2] PCP's office is the best resource  Answer Assessment - Initial Assessment Questions 1. REASON FOR CALL: What is the main reason for your call? or How can I best help you?     Pt is concerned about potassium levels. And water pills.  2. SYMPTOMS : Do you have any symptoms?      Legs are slightly swollen - which is not new 3. OTHER QUESTIONS: Do you have any other questions?     She is concerned about her Kidneys as she is not urinating as much as she used to. She is afraid she may have a kidney infection - no s/s.  Protocols used: Information Only Call - No Triage-A-AH

## 2023-12-13 NOTE — Telephone Encounter (Signed)
 Left a detailed message at the patient's home number stating she needs an appt, PCP does not have openings today and to call to schedule an appt with another provider for evaluation.

## 2023-12-15 ENCOUNTER — Other Ambulatory Visit (INDEPENDENT_AMBULATORY_CARE_PROVIDER_SITE_OTHER)

## 2023-12-15 DIAGNOSIS — E876 Hypokalemia: Secondary | ICD-10-CM | POA: Diagnosis not present

## 2023-12-16 LAB — BASIC METABOLIC PANEL WITH GFR
BUN: 17 mg/dL (ref 6–23)
CO2: 23 meq/L (ref 19–32)
Calcium: 9.3 mg/dL (ref 8.4–10.5)
Chloride: 104 meq/L (ref 96–112)
Creatinine, Ser: 1 mg/dL (ref 0.40–1.20)
GFR: 45.88 mL/min — ABNORMAL LOW (ref >60.00)
Glucose, Bld: 93 mg/dL (ref 70–99)
Potassium: 3.7 meq/L (ref 3.5–5.1)
Sodium: 141 meq/L (ref 135–145)

## 2023-12-19 ENCOUNTER — Ambulatory Visit: Payer: Self-pay | Admitting: Family Medicine

## 2023-12-20 DIAGNOSIS — M9905 Segmental and somatic dysfunction of pelvic region: Secondary | ICD-10-CM | POA: Diagnosis not present

## 2023-12-20 DIAGNOSIS — M5136 Other intervertebral disc degeneration, lumbar region with discogenic back pain only: Secondary | ICD-10-CM | POA: Diagnosis not present

## 2023-12-20 DIAGNOSIS — M9904 Segmental and somatic dysfunction of sacral region: Secondary | ICD-10-CM | POA: Diagnosis not present

## 2023-12-20 DIAGNOSIS — M9903 Segmental and somatic dysfunction of lumbar region: Secondary | ICD-10-CM | POA: Diagnosis not present

## 2023-12-27 ENCOUNTER — Other Ambulatory Visit: Payer: Self-pay | Admitting: Family Medicine

## 2023-12-27 DIAGNOSIS — R600 Localized edema: Secondary | ICD-10-CM

## 2024-01-03 ENCOUNTER — Ambulatory Visit: Payer: Self-pay

## 2024-01-03 ENCOUNTER — Emergency Department (HOSPITAL_BASED_OUTPATIENT_CLINIC_OR_DEPARTMENT_OTHER)

## 2024-01-03 ENCOUNTER — Other Ambulatory Visit: Payer: Self-pay

## 2024-01-03 ENCOUNTER — Encounter (HOSPITAL_BASED_OUTPATIENT_CLINIC_OR_DEPARTMENT_OTHER): Payer: Self-pay

## 2024-01-03 ENCOUNTER — Emergency Department (HOSPITAL_BASED_OUTPATIENT_CLINIC_OR_DEPARTMENT_OTHER): Admission: EM | Admit: 2024-01-03 | Discharge: 2024-01-03 | Disposition: A | Discharge status: Home / Self Care

## 2024-01-03 DIAGNOSIS — R600 Localized edema: Secondary | ICD-10-CM | POA: Insufficient documentation

## 2024-01-03 DIAGNOSIS — I4891 Unspecified atrial fibrillation: Secondary | ICD-10-CM | POA: Diagnosis not present

## 2024-01-03 DIAGNOSIS — I700 Atherosclerosis of aorta: Secondary | ICD-10-CM | POA: Diagnosis not present

## 2024-01-03 DIAGNOSIS — R2243 Localized swelling, mass and lump, lower limb, bilateral: Secondary | ICD-10-CM | POA: Insufficient documentation

## 2024-01-03 DIAGNOSIS — R079 Chest pain, unspecified: Secondary | ICD-10-CM | POA: Diagnosis not present

## 2024-01-03 DIAGNOSIS — M7989 Other specified soft tissue disorders: Secondary | ICD-10-CM | POA: Diagnosis not present

## 2024-01-03 DIAGNOSIS — Z96611 Presence of right artificial shoulder joint: Secondary | ICD-10-CM | POA: Diagnosis not present

## 2024-01-03 LAB — CBC WITH DIFFERENTIAL/PLATELET
Abs Immature Granulocytes: 0.02 K/uL (ref 0.00–0.07)
Basophils Absolute: 0.1 K/uL (ref 0.0–0.1)
Basophils Relative: 1 %
Eosinophils Absolute: 0.2 K/uL (ref 0.0–0.5)
Eosinophils Relative: 3 %
HCT: 33.2 % — ABNORMAL LOW (ref 36.0–46.0)
Hemoglobin: 11.1 g/dL — ABNORMAL LOW (ref 12.0–15.0)
Immature Granulocytes: 0 %
Lymphocytes Relative: 25 %
Lymphs Abs: 1.5 K/uL (ref 0.7–4.0)
MCH: 30.3 pg (ref 26.0–34.0)
MCHC: 33.4 g/dL (ref 30.0–36.0)
MCV: 90.7 fL (ref 80.0–100.0)
Monocytes Absolute: 0.7 K/uL (ref 0.1–1.0)
Monocytes Relative: 12 %
Neutro Abs: 3.7 K/uL (ref 1.7–7.7)
Neutrophils Relative %: 59 %
Platelets: 164 K/uL (ref 150–400)
RBC: 3.66 MIL/uL — ABNORMAL LOW (ref 3.87–5.11)
RDW: 14.8 % (ref 11.5–15.5)
WBC: 6.2 K/uL (ref 4.0–10.5)
nRBC: 0 % (ref 0.0–0.2)

## 2024-01-03 LAB — COMPREHENSIVE METABOLIC PANEL WITH GFR
ALT: 18 U/L (ref 0–44)
AST: 29 U/L (ref 15–41)
Albumin: 4.3 g/dL (ref 3.5–5.0)
Alkaline Phosphatase: 93 U/L (ref 38–126)
Anion gap: 15 (ref 5–15)
BUN: 19 mg/dL (ref 8–23)
CO2: 23 mmol/L (ref 22–32)
Calcium: 9.7 mg/dL (ref 8.9–10.3)
Chloride: 102 mmol/L (ref 98–111)
Creatinine, Ser: 1.1 mg/dL — ABNORMAL HIGH (ref 0.44–1.00)
GFR, Estimated: 44 mL/min — ABNORMAL LOW (ref >60)
Glucose, Bld: 91 mg/dL (ref 70–99)
Potassium: 3.3 mmol/L — ABNORMAL LOW (ref 3.5–5.1)
Sodium: 139 mmol/L (ref 135–145)
Total Bilirubin: 0.6 mg/dL (ref 0.0–1.2)
Total Protein: 7.1 g/dL (ref 6.5–8.1)

## 2024-01-03 LAB — PRO BRAIN NATRIURETIC PEPTIDE: Pro Brain Natriuretic Peptide: 3392 pg/mL — ABNORMAL HIGH (ref <300.0)

## 2024-01-03 MED ORDER — POTASSIUM CHLORIDE CRYS ER 20 MEQ PO TBCR
40.0000 meq | EXTENDED_RELEASE_TABLET | Freq: Once | ORAL | Status: AC
  Administered 2024-01-03: 40 meq via ORAL
  Filled 2024-01-03: qty 2

## 2024-01-03 MED ORDER — FUROSEMIDE 10 MG/ML IJ SOLN
40.0000 mg | Freq: Once | INTRAMUSCULAR | Status: AC
  Administered 2024-01-03: 40 mg via INTRAVENOUS
  Filled 2024-01-03: qty 4

## 2024-01-03 NOTE — ED Provider Notes (Signed)
 [place] [facility] AT [facility] Provider Note   CSN: [id number] Arrival date & time: 01/03/24  1136     Patient presents with: Leg Swelling   Heidi Moore is a 89 y.o. female who presents to the emergency department with a chief complaint of bilateral lower extremity leg swelling.  Daughter at bedside states that this has been an ongoing problem for the last few months.  States that every now and then her lower extremity swelling will get so bad she will have to come to the emergency department and be given IV diuretics.  Patient states that she is currently taking her torsemide  5mg  tablet 4 times daily due to her recent swelling, before this states she was taking it 3 times daily, and attempts to be compliant with her potassium but only takes because she feels like she has side effects any higher than this.  She states that she has had to come in multiple times for lower extremity edema with the first time being so bad that fluid was weeping from her legs.  Denies chest pain or shortness of breath.  Patient is ambulatory with a cane or walker at home.  States that swelling started last week and has persisted and worsened since then.  Past medical history significant for atrial fibrillation not on anticoagulation, osteoporosis, hyperlipidemia, macular degeneration, etc.  Per chart review most recent echocardiogram was in 2024 which showed dilated left atrium and severely dilated right atrium, left ventricular ejection fraction was noted to be 55 to 60%.   HPI     Prior to Admission medications   Medication Sig Start Date End Date Taking? Authorizing Provider  acetaminophen  (TYLENOL ) 325 MG tablet Take 650 mg by mouth every 6 (six) hours as needed for mild pain or headache.    [provider]  acetic acid -hydrocortisone  (VOSOL -HC) OTIC solution Place 3 drops into both ears 2 (two) times daily. 05/25/22   Ozell Heron HERO, MD  cholecalciferol  (VITAMIN D )  1000 UNITS tablet Take 1,000 Units by mouth 2 (two) times daily.     [provider]  Cyanocobalamin  (VITAMIN B 12 PO) Take by mouth daily.    [provider]  Estradiol  0.25 MG/0.25GM GEL Place 0.25 mg onto the skin daily. 11/15/23   Ozell Heron HERO, MD  fexofenadine (ALLEGRA  ALLERGY  CHILDRENS) 30 MG/5ML suspension 1/4 tsp daily 08/20/21   Jeneal Danita Macintosh, MD  ondansetron  (ZOFRAN ) 4 MG tablet Take 1 tablet (4 mg total) by mouth every 8 (eight) hours as needed for nausea or vomiting. 08/31/23   Ozell Heron HERO, MD  OVER THE COUNTER MEDICATION 2 (two) times daily. Preservision vitamin for eyes    [provider]  potassium chloride  (KLOR-CON  M) 20 MEQ tablet Take 1 tablet (20 mEq total) by mouth daily. Patient taking differently: Take 5 mEq by mouth daily. 07/26/23   Ozell Heron HERO, MD  torsemide  (DEMADEX ) 5 MG tablet Take 2 tablets by mouth twice daily 12/27/23   Ozell Heron HERO, MD  triamcinolone  cream (KENALOG ) 0.1 % Apply 1 Application topically 2 (two) times daily. 06/03/23   Ozell Heron HERO, MD  VITAMIN A PO Take by mouth daily.    [provider]  vitamin E 400 UNIT capsule Take 1 capsule by mouth daily.    [provider]    Allergies: Alendronate sodium, Anaplex hd, Aspirin , Azithromycin, Calcium , Ciprofloxacin, Codeine, Dorzolamide hcl-timolol mal, Doxycycline , Eliquis  [apixaban ], Gabapentin , Hydrocodone, Ibandronate sodium, Ibuprofen, Penicillins, Pradaxa  [dabigatran  etexilate mesylate],  Raloxifene, Risedronate sodium, Sulfonamide derivatives, Teriparatide, Travoprost, and Xarelto  [rivaroxaban ]    Review of Systems  Cardiovascular:  Positive for leg swelling.    Updated Vital Signs BP (!) 118/95   Pulse 61   Temp 97.9 F (36.6 C) (Oral)   Resp 19   SpO2 99%   Physical Exam Vitals and nursing note reviewed.  Constitutional:      General: She is awake. She is not in acute distress.    Appearance: Normal  appearance. She is not toxic-appearing or diaphoretic.     Comments: Patient talking in full sentences on room air, no obvious respiratory distress  HENT:     Head: Normocephalic and atraumatic.  Eyes:     General: No scleral icterus. Cardiovascular:     Rate and Rhythm: Rhythm irregular.  Pulmonary:     Effort: Pulmonary effort is normal. No respiratory distress.     Breath sounds: No wheezing, rhonchi or rales.  Musculoskeletal:     Right lower leg: Edema (2+ pitting) present.     Left lower leg: Edema (2+ pitting) present.  Skin:    General: Skin is warm.     Capillary Refill: Capillary refill takes less than 2 seconds.     Findings: Bruising (bruising present to LLE) present.  Neurological:     General: No focal deficit present.     Mental Status: She is alert and oriented to person, place, and time.  Psychiatric:        Mood and Affect: Mood normal.        Behavior: Behavior normal. Behavior is cooperative.     (all labs ordered are listed, but only abnormal results are displayed) Labs Reviewed  COMPREHENSIVE METABOLIC PANEL WITH GFR - Abnormal; Notable for the following components:      Result Value   Potassium 3.3 (*)    Creatinine, Ser 1.10 (*)    GFR, Estimated 44 (*)    All other components within normal limits  CBC WITH DIFFERENTIAL/PLATELET - Abnormal; Notable for the following components:   RBC 3.66 (*)    Hemoglobin 11.1 (*)    HCT 33.2 (*)    All other components within normal limits  PRO BRAIN NATRIURETIC PEPTIDE - Abnormal; Notable for the following components:   Pro Brain Natriuretic Peptide 3,392.0 (*)    All other components within normal limits    EKG: None  EKG showed atrial fibrillation by my interpretation.   Radiology: [facility] Chest Port 1 View Result Date: 01/03/2024 CLINICAL DATA:  Pain.  Leg swelling. EXAM: PORTABLE CHEST 1 VIEW COMPARISON:  04/18/2023 FINDINGS: Lungs are clear without airspace disease or pulmonary edema. No large pleural  effusions. Heart size is upper limits of normal but stable. Again noted is a right shoulder arthroplasty. Atherosclerotic calcifications at the aortic arch. IMPRESSION: No acute cardiopulmonary disease. Electronically Signed   By: Juliene Balder [name]   On: 01/03/2024 13:47     Procedures   Medications Ordered in the ED  furosemide  (LASIX ) injection 40 mg (40 mg Intravenous Given 01/03/24 1350)  potassium chloride  SA (KLOR-CON  M) CR tablet 40 mEq (40 mEq Oral Given 01/03/24 1350)                                    Medical Decision Making Amount and/or Complexity of Data Reviewed Labs: ordered. Radiology: ordered.  Risk Prescription drug management.   Patient presents to the ED  for concern of bilateral leg swelling, this involves an extensive number of treatment options, and is a complaint that carries with it a high risk of complications and morbidity.  The differential diagnosis includes heart failure exacerbation, renal failure, trauma/injury, lymphedema, venous stasis, PVD, etc.   Co morbidities that complicate the patient evaluation  atrial fibrillation not on anticoagulation, osteoporosis, hyperlipidemia, macular degeneration   Additional history obtained:  No history obtained from recent hospital visits for patient is seen for similar leg swelling, seems like patient is typically given dose of IV diuretics and potassium supplementation and then discharged   Lab Tests:  I Ordered, and personally interpreted labs.  The pertinent results include: Lab workup significant for hemoglobin of 11.1, CMP shows potassium 3.3, creatinine 1.1, estimated GFR 44, proBNP noted to be 3392   Imaging Studies ordered:  I ordered imaging studies including chest x-ray I independently visualized and interpreted imaging which showed no obvious pulmonary edema, no obvious cardiopulmonary abnormality I agree with the radiologist interpretation   Cardiac Monitoring:  The patient was maintained  on a cardiac monitor.  I personally viewed and interpreted the cardiac monitored which showed an underlying rhythm of: Atrial fibrillation   Medicines ordered and prescription drug management:  I ordered medication including Lasix  and potassium for bilateral lower extremity edema Reevaluation of the patient after these medicines showed that the patient improved I have reviewed the patients home medicines and have made adjustments as needed   Test Considered:  None   Critical Interventions:  None   Problem List / ED Course:  89 year old female, persistent atrial fibrillation not on anticoagulation due to risk factors, vital signs stable, presenting for bilateral lower extremity edema which she has been treated multiple times for in the recent months States that she takes her outpatient torsemide  as instructed however is only partially compliant with potassium due to side effects which she believes comes from the medication, states that she has taken her torsemide  today Per chart review looks like last echocardiogram was completed in 2024 which showed left ventricular ejection fraction of 55 to 60% with dilated left atrium and severely dilated right atrium Lab workup significant for hemoglobin of 11.1, potassium of 3.3, creatinine of 1.1, GFR 40, proBNP of 3,392 Will plan for IV diuresis and potassium supplementation Patient reassessed, slight improvement, no evidence of edema extending above knee area, vitals have remained stable throughout ED course, talking in full sentences on room air Looking through medical record it appears that patient is prescribed torsemide  5 mg tablets, instructed to take 2 tablets by mouth twice daily, patient states that this is not how she is taking this medication and has not been for some time.  She states that she is now taking one 5 mg tablet of torsemide  3 times daily on normal days, she states that she upped it to 4 times daily the last 2 days due to  increased swelling in her lower extremities. Patient voiced that she did not want admission throughout this encounter, instructed the patient to take her torsemide  every 6 hours and take her potassium as prescribed and follow-up with her primary care in the next 24-48 hours for re-check to ensure swelling continues to decreased Return precautions given Patient discharged Most likely diagnosis at this time is bilateral lower extremity pitting edema due to heart failure possibly stemming from persistent atrial fibrillation, patient has history of the same and improved with IV diuresis, patient on torsemide  and potassium supplementation outpatient, instructed to increase torsemide   to 4 times daily (every 6 hours) and follow-up with primary care provider, vital signs remained stable throughout the emergency department encounter, labs overall reassuring, potassium mildly decreased at 3.3, creatinine 1.1, BNP elevated at close to 3400, chest x-ray showed no evidence of pulmonary edema, patient showed no signs of shortness of breath throughout encounter   Reevaluation:  After the interventions noted above, I reevaluated the patient and found that they have :stayed the same   Social Determinants of Health:  none   Dispostion:  After consideration of the diagnostic results and the patients response to treatment, I feel that the patent would benefit from discharge and outpatient therapy as described, follow-up with primary care provider for recheck within 48 to 72 hours and ongoing diagnosis and treatment.     Final diagnoses:  Bilateral lower extremity edema    ED Discharge Orders     None          Ameliya Nicotra F, [name] 01/03/24 2218    Simon Lavonia SAILOR, MD 01/04/24 1022

## 2024-01-03 NOTE — Telephone Encounter (Signed)
 Noted

## 2024-01-03 NOTE — Telephone Encounter (Signed)
 FYI Only or Action Required?: FYI only for provider.  Patient was last seen in primary care on 11/15/2023 by Ozell Heron HERO, MD.  Called Nurse Triage reporting Leg Swelling.  Symptoms began x Friday.  Interventions attempted: Nothing.  Symptoms are: gradually worsening.  Triage Disposition: See PCP When Office is Open (Within 3 Days)  Patient/caregiver understands and will follow disposition?: Yes    Copied from CRM #[id number]. Topic: Clinical - Red Word Triage >> Jan 03, 2024  8:36 AM [place] MATSU wrote: Red Word that prompted transfer to Nurse Triage:  swelling and pain in hips and legs Reason for Disposition  [1] MILD swelling of both ankles (i.e., pedal edema) AND [2] new-onset or getting worse  Answer Assessment - Initial Assessment Questions 1. ONSET: When did the swelling start? (e.g., minutes, hours, days)     Friday 2. LOCATION: What part of the leg is swollen?  Are both legs swollen or just one leg?     Bilateral legs from knee down to feet , also states hip area could have fluid 3. SEVERITY: How bad is the swelling? (e.g., localized; mild, moderate, severe)     moderate 4. REDNESS: Is there redness or signs of infection?     no 5. PAIN: Is the swelling painful to touch? If Yes, ask: How painful is it?   (Scale 1-10; mild, moderate or severe)     moderate 6. FEVER: Do you have a fever? If Yes, ask: What is it, how was it measured, and when did it start?      no 7. CAUSE: What do you think is causing the leg swelling?     unknown 8. MEDICAL HISTORY: Do you have a history of blood clots (e.g., DVT), cancer, heart failure, kidney disease, or liver failure?     na 9. RECURRENT SYMPTOM: Have you had leg swelling before? If Yes, ask: When was the last time? What happened that time?     Yes - drain the fluid 10. OTHER SYMPTOMS: Do you have any other symptoms? (e.g., chest pain, difficulty breathing)       no 11. PREGNANCY: Is there any  chance you are pregnant? When was your last menstrual period?       Na  Legs sometimes weep.  Protocols used: Leg Swelling and Edema-A-AH

## 2024-01-03 NOTE — Discharge Instructions (Addendum)
 It was a pleasure taking care of you today.  Based on your history, physical exam, labs, and imaging I feel you are safe for discharge.  Today due to your swelling in your lower legs you were given IV Lasix  and given potassium.  Please take your prescribed torsemide  every 6 hours to help with swelling, please also continue to take your potassium as prescribed.  Please follow-up with your primary care provider within the next 48 hours for a recheck to ensure that your lower extremity edema is improving.  Please return to the emergency department or seek further medical care if you experience the following symptoms including but not limited to fever, chills, chest pain, shortness of breath, severe worsening leg swelling, unexplained weakness, or other concerning symptom.  If your primary care provider feels the need to change your fluid medication this can be done at your next primary care appointment.

## 2024-01-03 NOTE — ED Triage Notes (Signed)
 Pt c/o leg swelling it's been a little swollen for at least a week, worsening Wednesday after the gym. Denies SHOB, CP, additional symptoms.

## 2024-01-04 ENCOUNTER — Ambulatory Visit: Admitting: Family Medicine

## 2024-01-06 ENCOUNTER — Ambulatory Visit: Admitting: Family Medicine

## 2024-01-06 ENCOUNTER — Encounter: Payer: Self-pay | Admitting: Family Medicine

## 2024-01-06 ENCOUNTER — Ambulatory Visit: Payer: Medicare PPO | Admitting: Family Medicine

## 2024-01-06 VITALS — BP 112/58 | HR 59 | Temp 97.8°F | Ht 61.0 in | Wt 105.9 lb

## 2024-01-06 DIAGNOSIS — I50812 Chronic right heart failure: Secondary | ICD-10-CM

## 2024-01-06 DIAGNOSIS — I878 Other specified disorders of veins: Secondary | ICD-10-CM

## 2024-01-06 NOTE — Progress Notes (Unsigned)
 Established Patient Office Visit  Subjective   Patient ID: Heidi Moore, female    DOB: 24-Jun-1919  Age: 89 y.o. MRN: [id number]  Chief Complaint  Patient presents with  . Hospitalization Follow-up    Pt states she went to the ED on 01/03/24 for her swollen legs. States they put a catheter in her bladder and they started IV lasix , pt reports she produced 1600 ounces of urine and she was discharged. She reports since then her legs have improved, no problems with urination, is wearing her compression stockings daily. Is back up to taking the torsemide  5 mg QID .I counseled the patient daily in the morning to help her decide how much torsemide  to take in the day. Pt states she thinks that the torsemide  isn't working as well, states when she drinks coffee she urinates more. Reviewed ED notes and her labs.    Current Outpatient Medications  Medication Instructions  . acetaminophen  (TYLENOL ) 650 mg, Every 6 hours PRN  . acetic acid -hydrocortisone  (VOSOL -HC) OTIC solution 3 drops, Both EARS, 2 times daily  . cholecalciferol  (VITAMIN D ) 1,000 Units, 2 times daily  . Cyanocobalamin  (VITAMIN B 12 PO) Daily  . Estradiol  0.25 mg, Transdermal, Daily  . fexofenadine (ALLEGRA  ALLERGY  CHILDRENS) 30 MG/5ML suspension 1/4 tsp daily  . ondansetron  (ZOFRAN ) 4 mg, Oral, Every 8 hours PRN  . OVER THE COUNTER MEDICATION 2 times daily  . potassium chloride  (KLOR-CON  M) 20 MEQ tablet 20 mEq, Oral, Daily  . torsemide  (DEMADEX ) 10 mg, Oral, 2 times daily  . VITAMIN A PO Daily  . vitamin E 400 UNIT capsule 1 capsule, Daily    Patient Active Problem List   Diagnosis Date Noted  . Menopausal symptoms 06/07/2023  . Hypercoagulable state due to persistent atrial fibrillation (HCC) 02/02/2023  . Synovitis of right knee 11/10/2022  . Atrial fibrillation (HCC) 11/09/2022  . Chronic eczematous otitis externa of both ears 05/25/2022  . Anxiety 05/25/2022  . Dizziness 12/12/2021  . Dysuria 12/04/2021  .  Idiopathic polyneuropathy 12/04/2021  . Hypothyroidism 12/04/2021  . Optic atrophy of both eyes 12/10/2020  . Intermediate stage nonexudative age-related macular degeneration of both eyes 05/21/2020  . Primary open angle glaucoma of both eyes, severe stage 05/21/2020  . Scarring, chorioretinal, bilateral 05/21/2020  . Edema 01/26/2020  . Lower extremity edema 01/26/2020  . Osteoarthritis of right shoulder 09/11/2013    Class: Diagnosis of  . Macular degeneration - followed by optho, Dr. Dolin 08/24/2012  . LOW BACK PAIN SYNDROME 05/07/2010  . Vitamin D  deficiency 04/22/2009  . Allergic rhinitis 03/12/2008  . Hyperlipemia 10/22/2006      Review of Systems  All other systems reviewed and are negative.     Objective:     BP (!) 112/58   Pulse (!) 59   Temp 97.8 F (36.6 C) (Oral)   Ht 5' 1 (1.549 m)   Wt 105 lb 14.4 oz (48 kg)   SpO2 98%   BMI 20.01 kg/m  {Vitals History (Optional):23777}  Physical Exam Vitals reviewed.  Constitutional:      Appearance: Normal appearance. She is well-groomed and normal weight.  Cardiovascular:     Rate and Rhythm: Normal rate. Rhythm irregular.     Pulses: Normal pulses.     Heart sounds: S1 normal and S2 normal.  Pulmonary:     Effort: Pulmonary effort is normal.     Breath sounds: Normal breath sounds and air entry.  Musculoskeletal:     Right lower  leg: No edema.     Left lower leg: No edema.  Neurological:     Mental Status: She is alert and oriented to person, place, and time. Mental status is at baseline.     Gait: Gait is intact.  Psychiatric:        Mood and Affect: Mood and affect normal.        Speech: Speech normal.        Behavior: Behavior normal.        Judgment: Judgment normal.      No results found for any visits on 01/06/24.  {Labs (Optional):23779}  The ASCVD Risk score (Arnett DK, et al., 2019) failed to calculate for the following reasons:   The 2019 ASCVD risk score is only valid for ages 61 to  85    Assessment & Plan:  Chronic right heart failure (HCC)  Chronic venous stasis     Return in about 4 months (around 05/07/2024) for follow up on edema.    Heron CHRISTELLA Sharper, MD

## 2024-01-11 DIAGNOSIS — M5136 Other intervertebral disc degeneration, lumbar region with discogenic back pain only: Secondary | ICD-10-CM | POA: Diagnosis not present

## 2024-01-11 DIAGNOSIS — M9905 Segmental and somatic dysfunction of pelvic region: Secondary | ICD-10-CM | POA: Diagnosis not present

## 2024-01-11 DIAGNOSIS — M9903 Segmental and somatic dysfunction of lumbar region: Secondary | ICD-10-CM | POA: Diagnosis not present

## 2024-01-11 DIAGNOSIS — M9904 Segmental and somatic dysfunction of sacral region: Secondary | ICD-10-CM | POA: Diagnosis not present

## 2024-01-25 DIAGNOSIS — M9905 Segmental and somatic dysfunction of pelvic region: Secondary | ICD-10-CM | POA: Diagnosis not present

## 2024-01-25 DIAGNOSIS — M9904 Segmental and somatic dysfunction of sacral region: Secondary | ICD-10-CM | POA: Diagnosis not present

## 2024-01-25 DIAGNOSIS — M9903 Segmental and somatic dysfunction of lumbar region: Secondary | ICD-10-CM | POA: Diagnosis not present

## 2024-01-25 DIAGNOSIS — M5136 Other intervertebral disc degeneration, lumbar region with discogenic back pain only: Secondary | ICD-10-CM | POA: Diagnosis not present

## 2024-02-15 DIAGNOSIS — M9905 Segmental and somatic dysfunction of pelvic region: Secondary | ICD-10-CM | POA: Diagnosis not present

## 2024-02-15 DIAGNOSIS — M5136 Other intervertebral disc degeneration, lumbar region with discogenic back pain only: Secondary | ICD-10-CM | POA: Diagnosis not present

## 2024-02-15 DIAGNOSIS — M9904 Segmental and somatic dysfunction of sacral region: Secondary | ICD-10-CM | POA: Diagnosis not present

## 2024-02-15 DIAGNOSIS — M9903 Segmental and somatic dysfunction of lumbar region: Secondary | ICD-10-CM | POA: Diagnosis not present

## 2024-02-25 ENCOUNTER — Ambulatory Visit: Admitting: Internal Medicine

## 2024-02-25 ENCOUNTER — Ambulatory Visit: Payer: Self-pay | Admitting: *Deleted

## 2024-02-25 ENCOUNTER — Encounter: Payer: Self-pay | Admitting: Internal Medicine

## 2024-02-25 VITALS — BP 112/82 | HR 56 | Temp 98.2°F | Ht 61.0 in | Wt 102.0 lb

## 2024-02-25 DIAGNOSIS — H60311 Diffuse otitis externa, right ear: Secondary | ICD-10-CM | POA: Diagnosis not present

## 2024-02-25 DIAGNOSIS — E559 Vitamin D deficiency, unspecified: Secondary | ICD-10-CM

## 2024-02-25 DIAGNOSIS — H6091 Unspecified otitis externa, right ear: Secondary | ICD-10-CM | POA: Insufficient documentation

## 2024-02-25 DIAGNOSIS — I4891 Unspecified atrial fibrillation: Secondary | ICD-10-CM

## 2024-02-25 MED ORDER — CLINDAMYCIN HCL 300 MG PO CAPS: 300.0000 mg | ORAL_CAPSULE | Freq: Three times a day (TID) | ORAL | 0 refills | Status: DC

## 2024-02-25 MED ORDER — NEOMYCIN-POLYMYXIN-HC 3.5-10000-1 OT SOLN: 4.0000 [drp] | Freq: Four times a day (QID) | OTIC | 0 refills | Status: AC

## 2024-02-25 NOTE — Progress Notes (Unsigned)
 Patient ID: Heidi Moore, female   DOB: 1919-05-18, 89 y.o.   MRN: [id number]        Chief Complaint: follow up right ear pain       HPI:  Heidi Moore is a 89 y.o. female here with c/o 1 wk onset right ear pain with sense of swelling and soreness, but no fever, chills, vertigo, hearing loss worsening, sinus pain , sT or cough.  Pt denies chest pain, increased sob or doe, wheezing, orthopnea, PND, increased LE swelling, palpitations, or syncope, though she attributes some dizziness with taking the fluid pilll   Pt denies polydipsia, polyuria, or new focal neuro s/s.         Wt Readings from Last 3 Encounters:  02/25/24 102 lb (46.3 kg)  01/06/24 105 lb 14.4 oz (48 kg)  11/01/23 107 lb (48.5 kg)   BP Readings from Last 3 Encounters:  02/25/24 112/82  01/06/24 (!) 112/58  01/03/24 (!) 118/95         Past Medical History:  Diagnosis Date   A-fib (HCC)    AC (acromioclavicular) joint bone spurs    on right shoulder    Allergic rhinitis    Arthritis    Bursitis    right shoulder   Complication of anesthesia    headaches   Diverticulosis    DIVERTICULOSIS, COLON 10/15/2006   Qualifier: Diagnosis of  By: Tish MD, Elsie Slack problem    treated by Dr Lelon per patient   Yzjijryz(215.9)    sinus HA   History of blood transfusion 7yrs ago   no abnormal reaction noted   History of cystitis    History of skin cancer    Basal cell, Dr. Dempsey Dials   Hyperlipidemia    Framingham Study LDL goal =<160    Hypotension    Joint pain    Joint swelling    Macular degeneration, dry    Osteopenia    Intolerance to Calcium ,Actonel,Fosamax,Evista, Forteo: S/P Boniva 2007 to 03/2010   OSTEOPENIA 03/12/2008   Qualifier: Diagnosis of  By: Everet Pebbles     Osteoporosis    PONV (postoperative nausea and vomiting)    Rosacea 04/22/2009   Qualifier: Diagnosis of  By: Tish MD, William     Skin cancer 06/22/2017   per patient after excision right calf by Dr Cary   SKIN  CANCER, HX OF 03/12/2008   Qualifier: Diagnosis of  By: Everet Pebbles     Urinary frequency    Vaginal atrophy    sees Dr. Evangeline   Past Surgical History:  Procedure Laterality Date   APPENDECTOMY     BUNIONECTOMY Bilateral    CATARACT EXTRACTION, BILATERAL     COLONOSCOPY     with Tics (no further f/u as per GI due to age)    JOINT REPLACEMENT Left    knee    KNEE ARTHROPLASTY  05/18/2012   Procedure: COMPUTER ASSISTED TOTAL KNEE ARTHROPLASTY;  Surgeon: Oneil JAYSON Herald, MD;  Location: [facility] OR;  Service: Orthopedics;  Laterality: Left;  Left Total Knee Arthroplasty, Cemented, Computer Assisted   lt knee      meniscus tear   MOHS SURGERY     right nostril for basel cell cancer    NOSE SURGERY     due to trama at age 63    TOTAL ABDOMINAL HYSTERECTOMY W/ BILATERAL SALPINGOOPHORECTOMY     for Endometriosis    TOTAL SHOULDER ARTHROPLASTY Right 09/11/2013  Procedure: TOTAL SHOULDER ARTHROPLASTY;  Surgeon: Oneil JAYSON Herald, MD;  Location: [facility] OR;  Service: Orthopedics;  Laterality: Right;  Right Total Shoulder Arthroplasty    reports that she has never smoked. She has never been exposed to tobacco smoke. She has never used smokeless tobacco. She reports that she does not drink alcohol and does not use drugs. family history includes Allergic rhinitis in her daughter and son; Atrial fibrillation in her brother; Colon cancer in an other family member; Diabetes in her brother; Lung cancer in her sister; Stroke (age of onset: 66) in her mother. Allergies  Allergen Reactions   Alendronate Sodium Nausea And Vomiting   Anaplex Hd Nausea And Vomiting   Aspirin  Nausea And Vomiting   Azithromycin Nausea And Vomiting   Calcium  Nausea And Vomiting   Ciprofloxacin     Patient states this caused nausea, dizziness and affected her muscles   Codeine Nausea And Vomiting   Dorzolamide Hcl-Timolol Mal Other (See Comments)   Doxycycline  Nausea And Vomiting   Eliquis  [Apixaban ] Other (See Comments)     Swelling/stinging of skin   Gabapentin  Other (See Comments)    Dizziness, Joint pain, blurred vision, changes in eyesight   Hydrocodone Nausea And Vomiting   Ibandronate Sodium     REACTION: severe body aches   Ibuprofen Nausea And Vomiting   Penicillins Nausea And Vomiting   Pradaxa  [Dabigatran  Etexilate Mesylate] Other (See Comments)    Swelling/stinging of skin   Raloxifene Nausea And Vomiting   Risedronate Sodium Nausea And Vomiting   Sulfonamide Derivatives Nausea And Vomiting   Teriparatide Nausea And Vomiting   Travoprost    Xarelto  [Rivaroxaban ] Other (See Comments)    Swelling/stinging of skin   Current Outpatient Medications on File Prior to Visit  Medication Sig Dispense Refill   acetaminophen  (TYLENOL ) 325 MG tablet Take 650 mg by mouth every 6 (six) hours as needed for mild pain or headache.     acetic acid -hydrocortisone  (VOSOL -HC) OTIC solution Place 3 drops into both ears 2 (two) times daily. 10 mL 2   cholecalciferol  (VITAMIN D ) 1000 UNITS tablet Take 1,000 Units by mouth 2 (two) times daily.      Cyanocobalamin  (VITAMIN B 12 PO) Take by mouth daily.     Estradiol  0.25 MG/0.25GM GEL Place 0.25 mg onto the skin daily. 30 each 5   fexofenadine (ALLEGRA  ALLERGY  CHILDRENS) 30 MG/5ML suspension 1/4 tsp daily 118 mL 5   ondansetron  (ZOFRAN ) 4 MG tablet Take 1 tablet (4 mg total) by mouth every 8 (eight) hours as needed for nausea or vomiting. 20 tablet 0   OVER THE COUNTER MEDICATION 2 (two) times daily. Preservision vitamin for eyes     potassium chloride  (KLOR-CON  M) 20 MEQ tablet Take 1 tablet (20 mEq total) by mouth daily. (Patient taking differently: Take 5 mEq by mouth daily.) 90 tablet 1   torsemide  (DEMADEX ) 5 MG tablet Take 2 tablets by mouth twice daily 120 tablet 3   VITAMIN A PO Take by mouth daily.     vitamin E 400 UNIT capsule Take 1 capsule by mouth daily.     No current facility-administered medications on file prior to visit.        ROS:  All  others reviewed and negative.  Objective        PE:  BP 112/82 (BP Location: Left Arm, Patient Position: Sitting, Cuff Size: Normal)   Pulse (!) 56   Temp 98.2 F (36.8 C) (Oral)   Ht 5' 1 (  1.549 m)   Wt 102 lb (46.3 kg)   SpO2 98%   BMI 19.27 kg/m                 Constitutional: Pt appears in NAD               HENT: Head: NCAT.                Right Ear: External ear normal.  Right ext canal with 2+ red, tender swelling without bleeding or d/c               Left Ear: External ear normal.                Eyes: . Pupils are equal, round, and reactive to light. Conjunctivae and EOM are normal               Nose: without d/c or deformity               Neck: Neck supple. Gross normal ROM               Cardiovascular: Normal rate and irregular rhythm.                 Pulmonary/Chest: Effort normal and breath sounds without rales or wheezing.                               Neurological: Pt is alert. At baseline orientation, motor grossly intact               Skin: Skin is warm. No rashes, no other new lesions, LE edema - none               Psychiatric: Pt behavior is normal without agitation   Micro: none  Cardiac tracings I have personally interpreted today:  none  Pertinent Radiological findings (summarize): none   Lab Results  Component Value Date   WBC 6.2 01/03/2024   HGB 11.1 (L) 01/03/2024   HCT 33.2 (L) 01/03/2024   PLT 164 01/03/2024   GLUCOSE 91 01/03/2024   CHOL 157 10/31/2021   TRIG 63.0 10/31/2021   HDL 66.30 10/31/2021   LDLCALC 78 10/31/2021   ALT 18 01/03/2024   AST 29 01/03/2024   NA 139 01/03/2024   K 3.3 (L) 01/03/2024   CL 102 01/03/2024   CREATININE 1.10 (H) 01/03/2024   BUN 19 01/03/2024   CO2 23 01/03/2024   TSH 3.84 07/09/2023   INR 1.1 06/18/2019   HGBA1C 5.6 11/02/2023   Assessment/Plan:  Heidi Moore is a 89 y.o. White or Caucasian [1] female with  has a past medical history of A-fib (HCC), AC (acromioclavicular) joint bone spurs,  Allergic rhinitis, Arthritis, Bursitis, Complication of anesthesia, Diverticulosis, DIVERTICULOSIS, COLON (10/15/2006), Eye problem, Headache(784.0), History of blood transfusion (28yrs ago), History of cystitis, History of skin cancer, Hyperlipidemia, Hypotension, Joint pain, Joint swelling, Macular degeneration, dry, Osteopenia, OSTEOPENIA (03/12/2008), Osteoporosis, PONV (postoperative nausea and vomiting), Rosacea (04/22/2009), Skin cancer (06/22/2017), SKIN CANCER, HX OF (03/12/2008), Urinary frequency, and Vaginal atrophy.  External otitis of right ear Mild to mod, for cortisporin  otic but unclear will be effective due to swelling involved, has numerous antibx allergy , will only accept oral cleocin  300 mg tid x 7 days,,  to f/u any worsening symptoms or concerns  Vitamin D  deficiency Last vitamin D  Lab Results  Component Value Date   VD25OH 31.79 12/04/2020  Low, to start oral replacement   Atrial fibrillation (HCC) Normal rate and volume, continue current tx,  to f/u any worsening symptoms or concerns  Followup: Return if symptoms worsen or fail to improve.  Lynwood Rush, MD 02/26/2024 9:45 AM [facility]

## 2024-02-25 NOTE — Patient Instructions (Signed)
 Please take all new medication as prescribed - the ear drops, but if not working well, you are given the cleocin  as well  Please continue all other medications as before, and refills have been done if requested.  Please have the pharmacy call with any other refills you may need.  Please keep your appointments with your specialists as you may have planned

## 2024-02-25 NOTE — Telephone Encounter (Signed)
 Noted- ok to close.

## 2024-02-25 NOTE — Telephone Encounter (Signed)
 FYI Only or Action Required?: FYI only for provider.  Patient was last seen in primary care on 01/06/2024 by Ozell Heron HERO, MD.  Called Nurse Triage reporting Otalgia.  Symptoms began yesterday.  Interventions attempted: OTC medications: allergy  medications.  Symptoms are: gradually worsening.  Triage Disposition: See Physician Within 24 Hours  Patient/caregiver understands and will follow disposition?: Yes   Reason for Disposition  Earache  (Exceptions: Brief ear pain of lasting less than 60 minutes, or earache occurring during air travel.)  Answer Assessment - Initial Assessment Questions 1. LOCATION: Which ear is involved?     R ear 2. ONSET: When did the ear pain start?      yesterday 3. SEVERITY: How bad is the pain?  (Scale 1-10; mild, moderate or severe)     Feels swollen and itching, pain to swallow 4. URI SYMPTOMS: Do you have a runny nose or cough?     Allergy  symptoms- sinus symptoms 5. FEVER: Do you have a fever? If Yes, ask: What is your temperature, how was it measured, and when did it start?     no 6. CAUSE: Have you been swimming recently?, How often do you use Q-TIPS?, Have you had any recent air travel or scuba diving?     Ear infection 7. OTHER SYMPTOMS: Do you have any other symptoms? (e.g., decreased hearing, dizziness, headache, stiff neck, vomiting)     Hurts to swallow-in ear  Protocols used: Earache-A-AH   Copied from CRM #[id number]. Topic: Clinical - Red Word Triage >> Feb 25, 2024 11:53 AM Franky GRADE wrote: Red Word that prompted transfer to Nurse Triage: Patient is experiencing what she assumes is an ear infection, swelling and pain in the right ear.

## 2024-02-26 ENCOUNTER — Encounter: Payer: Self-pay | Admitting: Internal Medicine

## 2024-02-26 NOTE — Assessment & Plan Note (Addendum)
 Mild to mod, for cortisporin  otic but unclear will be effective due to swelling involved, has numerous antibx allergy , will only accept oral cleocin  300 mg tid x 7 days,,  to f/u any worsening symptoms or concerns

## 2024-02-26 NOTE — Assessment & Plan Note (Signed)
 Normal rate and volume, continue current tx,  to f/u any worsening symptoms or concerns

## 2024-02-26 NOTE — Assessment & Plan Note (Signed)
 Last vitamin D  Lab Results  Component Value Date   VD25OH 31.79 12/04/2020   Low, to start oral replacement

## 2024-03-09 ENCOUNTER — Other Ambulatory Visit (INDEPENDENT_AMBULATORY_CARE_PROVIDER_SITE_OTHER): Payer: Self-pay

## 2024-03-09 ENCOUNTER — Ambulatory Visit: Admitting: Orthopedic Surgery

## 2024-03-09 DIAGNOSIS — G8929 Other chronic pain: Secondary | ICD-10-CM

## 2024-03-09 DIAGNOSIS — M25561 Pain in right knee: Secondary | ICD-10-CM

## 2024-03-10 ENCOUNTER — Encounter: Payer: Self-pay | Admitting: Orthopedic Surgery

## 2024-03-10 DIAGNOSIS — G8929 Other chronic pain: Secondary | ICD-10-CM | POA: Diagnosis not present

## 2024-03-10 DIAGNOSIS — M25561 Pain in right knee: Secondary | ICD-10-CM | POA: Diagnosis not present

## 2024-03-10 MED ORDER — METHYLPREDNISOLONE ACETATE 40 MG/ML IJ SUSP
40.0000 mg | INTRAMUSCULAR | Status: AC | PRN
  Administered 2024-03-10: 40 mg via INTRA_ARTICULAR

## 2024-03-10 MED ORDER — LIDOCAINE HCL (PF) 1 % IJ SOLN
5.0000 mL | INTRAMUSCULAR | Status: AC | PRN
  Administered 2024-03-10: 5 mL

## 2024-03-10 NOTE — Progress Notes (Signed)
 Office Visit Note   Patient: Heidi Moore           Date of Birth: [birth year]           MRN: [id number] Visit Date: 03/09/2024              Requested by: Ozell Heron HERO, MD [address] [place],  [place] [address] PCP: Ozell Heron HERO, MD  Chief Complaint  Patient presents with   Right Knee - Pain      HPI: Discussed the use of AI scribe software for clinical note transcription with the patient, who gave verbal consent to proceed.  History of Present Illness Heidi Moore is a 89 year old female with osteoarthritis who presents with right knee pain.  She experiences right knee pain localized to the medial joint line. She has a history of osteoarthritis and previously received a cortisone injection in the knee, which provided relief. No recent trauma or falls affecting the knee. She uses a cane for ambulation and has difficulty transitioning from sitting to standing positions.  She has a Baker's cyst in the popliteal fossa of the right knee, which becomes more prominent at night and causes discomfort.  She has been on diuretics since May of the previous year, initially thinking the knee issues were related to fluid retention. She also has a history of atrial fibrillation, for which she takes a low-dose diuretic.     Assessment & Plan: Visit Diagnoses:  1. Chronic pain of right knee     Plan: Assessment and Plan Assessment & Plan Right knee osteoarthritis with Baker's cyst Chronic osteoarthritis with tricompartmental changes causing pain and Baker's cyst. Arthritis confirmed as cause of cyst and pain. - Administer steroid injection to the right knee. - Order new radiographs of the right knee. - Advise use of topical arthritis cream up to three or four times daily as directed. - Schedule follow-up in three months for potential repeat cortisone injection if symptoms persist.      Follow-Up Instructions: Return if symptoms worsen or fail to improve.    Ortho Exam  Patient is alert, oriented, no adenopathy, well-dressed, normal affect, normal respiratory effort. Physical Exam MUSCULOSKELETAL: Maximal tenderness to palpation over medial joint line of right knee. Collateral and cruciate ligaments stable in right knee. No acute trauma to right knee. Baker's cyst in popliteal fossa of right knee.      Imaging: No results found. No images are attached to the encounter.  Labs: Lab Results  Component Value Date   HGBA1C 5.6 11/02/2023   HGBA1C 5.2 03/10/2022   HGBA1C 5.1 03/28/2021   ESRSEDRATE 14 12/04/2020   ESRSEDRATE 12 06/01/2019   CRP <1.0 06/01/2019   REPTSTATUS 06/19/2023 FINAL 06/17/2023   CULT >=100,000 COLONIES/mL KLEBSIELLA PNEUMONIAE (A) 06/17/2023   LABORGA KLEBSIELLA PNEUMONIAE (A) 06/17/2023     Lab Results  Component Value Date   ALBUMIN 4.3 01/03/2024   ALBUMIN 4.1 03/30/2023   ALBUMIN 4.3 03/22/2023    Lab Results  Component Value Date   MG 1.9 03/30/2023   MG 2.1 03/03/2023   MG 2.1 10/31/2021   Lab Results  Component Value Date   VD25OH 31.79 12/04/2020   VD25OH 37.78 07/28/2019   VD25OH 36.19 06/01/2019    No results found for: PREALBUMIN    Latest Ref Rng & Units 01/03/2024   12:02 PM 07/22/2023   11:29 AM 06/17/2023   10:36 AM  CBC EXTENDED  WBC 4.0 - 10.5 K/uL 6.2  6.4  6.7   RBC 3.87 - 5.11 MIL/uL 3.66  3.62  4.02   Hemoglobin 12.0 - 15.0 g/dL 88.8  89.1  87.8   HCT 36.0 - 46.0 % 33.2  32.3  36.6   Platelets 150 - 400 K/uL 164  155  156   NEUT# 1.7 - 7.7 K/uL 3.7  4.4    Lymph# 0.7 - 4.0 K/uL 1.5  0.9       There is no height or weight on file to calculate BMI.  Orders:  Orders Placed This Encounter  Procedures   XR Knee 1-2 Views Right   No orders of the defined types were placed in this encounter.    Procedures: Large Joint Inj: R knee on 03/10/2024 4:11 PM Indications: pain and diagnostic evaluation Details: 22 G 1.5 in needle, anteromedial approach  Arthrogram:  No  Medications: 5 mL lidocaine  (PF) 1 %; 40 mg methylPREDNISolone  acetate 40 MG/ML Outcome: tolerated well, no immediate complications Procedure, treatment alternatives, risks and benefits explained, specific risks discussed. Consent was given by the patient. Immediately prior to procedure a time out was called to verify the correct patient, procedure, equipment, support staff and site/side marked as required. Patient was prepped and draped in the usual sterile fashion.      Clinical Data: No additional findings.  ROS:  All other systems negative, except as noted in the HPI. Review of Systems  Objective: Vital Signs: There were no vitals taken for this visit.  Specialty Comments:  No specialty comments available.  PMFS History: Patient Active Problem List   Diagnosis Date Noted   External otitis of right ear 02/25/2024   Menopausal symptoms 06/07/2023   Hypercoagulable state due to persistent atrial fibrillation (HCC) 02/02/2023   Synovitis of right knee 11/10/2022   Atrial fibrillation (HCC) 11/09/2022   Chronic eczematous otitis externa of both ears 05/25/2022   Anxiety 05/25/2022   Dizziness 12/12/2021   Dysuria 12/04/2021   Idiopathic polyneuropathy 12/04/2021   Hypothyroidism 12/04/2021   Optic atrophy of both eyes 12/10/2020   Intermediate stage nonexudative age-related macular degeneration of both eyes 05/21/2020   Primary open angle glaucoma of both eyes, severe stage 05/21/2020   Scarring, chorioretinal, 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 Dolin 08/24/2012   LOW BACK PAIN SYNDROME 05/07/2010   Vitamin D  deficiency 04/22/2009   Allergic rhinitis 03/12/2008   Hyperlipemia 10/22/2006   Past Medical History:  Diagnosis Date   A-fib (HCC)    AC (acromioclavicular) joint bone spurs    on right shoulder     Allergic rhinitis    Arthritis    Bursitis    right shoulder   Complication of anesthesia    headaches   Diverticulosis    DIVERTICULOSIS, COLON 10/15/2006   Qualifier: Diagnosis of  By: Tish MD, Elsie Slack problem    treated by Dr Lelon per patient   Yzjijryz(215.9)    sinus HA   History of blood transfusion 2yrs ago   no abnormal reaction noted   History of cystitis    History of skin cancer    Basal cell, Dr. Dempsey Dials   Hyperlipidemia    Framingham Study LDL goal =<160    Hypotension    Joint pain    Joint swelling    Macular degeneration, dry  Osteopenia    Intolerance to Calcium ,Actonel,Fosamax,Evista, Forteo: S/P Boniva 2007 to 03/2010   OSTEOPENIA 03/12/2008   Qualifier: Diagnosis of  By: Everet Pebbles     Osteoporosis    PONV (postoperative nausea and vomiting)    Rosacea 04/22/2009   Qualifier: Diagnosis of  By: Tish MD, Elsie [place] cancer 06/22/2017   per patient after excision right calf by Dr Cary   SKIN CANCER, HX OF 03/12/2008   Qualifier: Diagnosis of  By: Everet Pebbles     Urinary frequency    Vaginal atrophy    sees Dr. Evangeline    Family History  Problem Relation Age of Onset   Stroke Mother 28   Lung cancer Sister    Diabetes Brother    Atrial fibrillation Brother        coumadin   Colon cancer Other        1st Cousin    Allergic rhinitis Son    Allergic rhinitis Daughter     Past Surgical History:  Procedure Laterality Date   APPENDECTOMY     BUNIONECTOMY Bilateral    CATARACT EXTRACTION, BILATERAL     COLONOSCOPY     with Tics (no further f/u as per GI due to age)    JOINT REPLACEMENT Left    knee    KNEE ARTHROPLASTY  05/18/2012   Procedure: COMPUTER ASSISTED TOTAL KNEE ARTHROPLASTY;  Surgeon: Oneil JAYSON Herald, MD;  Location: [facility] OR;  Service: Orthopedics;  Laterality: Left;  Left Total Knee Arthroplasty, Cemented, Computer Assisted   lt knee      meniscus tear   MOHS SURGERY     right nostril for basel cell  cancer    NOSE SURGERY     due to trama at age 32    TOTAL ABDOMINAL HYSTERECTOMY W/ BILATERAL SALPINGOOPHORECTOMY     for Endometriosis    TOTAL SHOULDER ARTHROPLASTY Right 09/11/2013   Procedure: TOTAL SHOULDER ARTHROPLASTY;  Surgeon: Oneil JAYSON Herald, MD;  Location: [facility] OR;  Service: Orthopedics;  Laterality: Right;  Right Total Shoulder Arthroplasty   Social History   Occupational History    Employer: RETIRED  Tobacco Use   Smoking status: Never    Passive exposure: Never   Smokeless tobacco: Never   Tobacco comments:    Never smoked 02/02/23  Vaping Use   Vaping status: Never Used  Substance and Sexual Activity   Alcohol use: No   Drug use: No   Sexual activity: Not Currently    Birth control/protection: Surgical

## 2024-03-13 ENCOUNTER — Encounter: Payer: Self-pay | Admitting: Radiology

## 2024-03-14 DIAGNOSIS — M9904 Segmental and somatic dysfunction of sacral region: Secondary | ICD-10-CM | POA: Diagnosis not present

## 2024-03-14 DIAGNOSIS — M5136 Other intervertebral disc degeneration, lumbar region with discogenic back pain only: Secondary | ICD-10-CM | POA: Diagnosis not present

## 2024-03-14 DIAGNOSIS — M9903 Segmental and somatic dysfunction of lumbar region: Secondary | ICD-10-CM | POA: Diagnosis not present

## 2024-03-14 DIAGNOSIS — M9905 Segmental and somatic dysfunction of pelvic region: Secondary | ICD-10-CM | POA: Diagnosis not present

## 2024-03-17 ENCOUNTER — Telehealth: Payer: Self-pay | Admitting: Orthopedic Surgery

## 2024-03-17 NOTE — Telephone Encounter (Signed)
 Pt called wanting to know what kind of shot she got in her knee on her last appt. Sh also wants to know if he can give her a gel shot in her knee. Pt call back number is [phone]

## 2024-03-17 NOTE — Telephone Encounter (Signed)
 This pt received a steroid injection on 03/09/2024 in her right knee. She would like to know when she can have another one. Can you please advise.

## 2024-03-20 NOTE — Telephone Encounter (Signed)
 Pt advised cortisone injection can not get another for 3 months.

## 2024-03-20 NOTE — Telephone Encounter (Signed)
 I tried to call pt to advise and line is busy. I will hold and try again later.

## 2024-03-23 ENCOUNTER — Ambulatory Visit: Payer: Self-pay

## 2024-03-23 NOTE — Telephone Encounter (Signed)
 FYI Only or Action Required?: FYI only for provider: appointment scheduled on 03/27/2024 at 3 PM.  Patient was last seen in primary care on 02/25/2024 by Norleen Lynwood ORN, MD.  Called Nurse Triage reporting Dizziness.  Symptoms began several weeks ago.  Interventions attempted: Rest, hydration, or home remedies.  Symptoms are: unchanged.  Triage Disposition: See PCP When Office is Open (Within 3 Days)  Patient/caregiver understands and will follow disposition?: Yes  Copied from CRM #[id number]. Topic: Clinical - Medication Question >> Mar 23, 2024  4:24 PM Hadassah PARAS wrote: Reason for CRM: Pt has questions over some medication. Transferred to CAL Reason for Disposition  [1] MODERATE dizziness (e.g., interferes with normal activities) AND [2] has been evaluated by doctor (or NP/PA) for this  Answer Assessment - Initial Assessment Questions Patient calling to reports continued dizziness with taking torsemide . Patient is unsure of what else might be causing the dizziness. Scheduled an appointment for 03/27/2024 at 3:00 PM. Patient states she might have to call back to reschedule if daughter is unable to take her.   1. DESCRIPTION: Describe your dizziness.     Reports having blurred vision after taking torsemide . Endorses dizziness 2. LIGHTHEADED: Do you feel lightheaded? (e.g., somewhat faint, woozy, weak upon standing)     yes 3. VERTIGO: Do you feel like either you or the room is spinning or tilting? (i.e., vertigo)     yes 4. SEVERITY: How bad is it?  Do you feel like you are going to faint? Can you stand and walk?     moderate 5. ONSET:  When did the dizziness begin?     Patient states dizziness has been going on since she was started on torsemide  6. AGGRAVATING FACTORS: Does anything make it worse? (e.g., standing, change in head position)     no 7. HEART RATE: Can you tell me your heart rate? How many beats in 15 seconds?  (Note: Not all patients can do this.)        NA 8. CAUSE: What do you think is causing the dizziness? (e.g., decreased fluids or food, diarrhea, emotional distress, heat exposure, new medicine, sudden standing, vomiting; unknown)     unsure 9. RECURRENT SYMPTOM: Have you had dizziness before? If Yes, ask: When was the last time? What happened that time?     yes 10. OTHER SYMPTOMS: Do you have any other symptoms? (e.g., fever, chest pain, vomiting, diarrhea, bleeding)       no  Protocols used: Dizziness - Lightheadedness-A-AH

## 2024-03-24 NOTE — Telephone Encounter (Signed)
 Noted- ok to close.

## 2024-03-27 ENCOUNTER — Ambulatory Visit: Admitting: Family Medicine

## 2024-03-27 ENCOUNTER — Encounter: Payer: Self-pay | Admitting: Family Medicine

## 2024-03-27 VITALS — BP 100/58 | HR 60 | Temp 97.8°F | Ht 61.0 in | Wt 106.0 lb

## 2024-03-27 DIAGNOSIS — E538 Deficiency of other specified B group vitamins: Secondary | ICD-10-CM

## 2024-03-27 DIAGNOSIS — E876 Hypokalemia: Secondary | ICD-10-CM

## 2024-03-27 DIAGNOSIS — I872 Venous insufficiency (chronic) (peripheral): Secondary | ICD-10-CM | POA: Diagnosis not present

## 2024-03-27 NOTE — Progress Notes (Unsigned)
 Established Patient Office Visit  Subjective   Patient ID: Heidi Moore, female    DOB: 1919/12/26  Age: 89 y.o. MRN: [id number]  Chief Complaint  Patient presents with   Dizziness    Recurrent since starting fluid pill    HPI Discussed the use of AI scribe software for clinical note transcription with the patient, who gave verbal consent to proceed.  History of Present Illness   Heidi Moore is a 89 year old female who presents with concerns about ear infection, knee pain, and lip swelling.  She experienced an ear infection treated with clindamycin  and ear drops, but discontinued the medication after three days due to difficulty swallowing. The infection has resolved, and she retains the medication as it is the only antibiotic she can tolerate.  She has knee pain and swelling, particularly in the right knee, treated with a cortisone shot three weeks ago. The knee remains sore with difficulty bending. She has arthritis and has previously received cortisone shots.  She experiences swelling in both lips and a sensation of swelling in her tongue, especially in the mornings, along with dry mouth. She suspects her fluid pill, taken every six hours, may contribute to these symptoms and has experienced dizziness since starting it.  She has dry skin under her arms and on the bottom of her feet, using lotion regularly. She feels sleepy all the time, possibly due to the fluid pill, and wakes frequently at night to urinate, contributing to daytime fatigue.       Current Outpatient Medications  Medication Instructions   acetaminophen  (TYLENOL ) 650 mg, Every 6 hours PRN   acetic acid -hydrocortisone  (VOSOL -HC) OTIC solution 3 drops, Both EARS, 2 times daily   cholecalciferol  (VITAMIN D ) 1,000 Units, 2 times daily   clindamycin  (CLEOCIN ) 300 mg, Oral, 3 times daily   Cyanocobalamin  (VITAMIN B 12 PO) Daily   Estradiol  0.25 mg, Transdermal, Daily   fexofenadine (ALLEGRA  ALLERGY  CHILDRENS)  30 MG/5ML suspension 1/4 tsp daily   ondansetron  (ZOFRAN ) 4 mg, Oral, Every 8 hours PRN   OVER THE COUNTER MEDICATION 2 times daily   potassium chloride  (KLOR-CON  M) 20 MEQ tablet 20 mEq, Oral, Daily   torsemide  (DEMADEX ) 10 mg, Oral, 2 times daily   VITAMIN A PO Daily   vitamin E 400 UNIT capsule 1 capsule, Daily    Patient Active Problem List   Diagnosis Date Noted   External otitis of right ear 02/25/2024   Menopausal symptoms 06/07/2023   Hypercoagulable state due to persistent atrial fibrillation (HCC) 02/02/2023   Synovitis of right knee 11/10/2022   Atrial fibrillation (HCC) 11/09/2022   Chronic eczematous otitis externa of both ears 05/25/2022   Anxiety 05/25/2022   Dizziness 12/12/2021   Dysuria 12/04/2021   Idiopathic polyneuropathy 12/04/2021   Hypothyroidism 12/04/2021   Optic atrophy of both eyes 12/10/2020   Intermediate stage nonexudative age-related macular degeneration of both eyes 05/21/2020   Primary open angle glaucoma of both eyes, severe stage 05/21/2020   Scarring, chorioretinal, 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 Dolin 08/24/2012   LOW BACK PAIN SYNDROME 05/07/2010   Vitamin D  deficiency 04/22/2009   Allergic rhinitis 03/12/2008   Hyperlipemia 10/22/2006     Review of Systems  All other systems reviewed and are negative.     Objective:  BP (!) 100/58   Pulse 60   Temp 97.8 F (36.6 C) (Oral)   Ht 5' 1 (1.549 m)   Wt 106 lb (48.1 kg)   SpO2 98%   BMI 20.03 kg/m    Physical Exam Vitals reviewed.  Constitutional:      Appearance: Normal appearance. She is normal weight.  Cardiovascular:     Rate and Rhythm: Normal rate and regular rhythm.     Heart sounds: Normal heart sounds.  Pulmonary:     Effort: Pulmonary effort is normal.      Breath sounds: No wheezing.  Musculoskeletal:     Right lower leg: Edema (1+ BLE, stable from previous exams) present.     Left lower leg: Edema present.  Neurological:     Mental Status: She is alert and oriented to person, place, and time. Mental status is at baseline.  Psychiatric:        Mood and Affect: Mood normal.        Behavior: Behavior normal.       The ASCVD Risk score (Arnett DK, et al., 2019) failed to calculate for the following reasons:   The 2019 ASCVD risk score is only valid for ages 65 to 66    Assessment & Plan:  B12 deficiency -     Vitamin B12; Future  Hypokalemia -     Basic metabolic panel with GFR; Future  Chronic venous insufficiency of lower extremity   Assessment and Plan    Osteoarthritis of right knee with bone on bone changes Severe osteoarthritis with bone on bone contact in the right knee, confirmed by x-ray. Persistent soreness and swelling despite cortisone injection. No significant improvement in symptoms post-injection. Possible nerve irritation or fluid accumulation post-injection. Consideration of knee replacement if cortisone injections cease to be effective. - Continue cortisone injections every 3-4 months as long as she provides relief. - Use Tylenol  for pain management. - Wear knee brace for support. - Apply Voltaren  gel up to four times a day for arthritis pain.  Edema of lower extremities Intermittent edema managed with torsemide . Current regimen of torsemide  every six hours causing dizziness. Edema appears controlled, allowing for potential reduction in diuretic frequency. - Reduced torsemide  to three times a day. - Monitor leg swelling and adjust torsemide  intake accordingly.  Dry mouth and lip swelling Persistent dry mouth and lip swelling, possibly due to mouth breathing at night. No medication identified as cause. Potential B12 deficiency considered. - Apply Chapstick to lips nightly. - Checked B12 levels.  Xerosis (dry  skin) of feet and body Dry skin on feet and body, with thickened skin on feet. Possible exacerbation from hot water soaks. - Use Aveeno lotion for dry skin. - Soak feet in hot water with Epsom salts and use a pumice stone for rough skin.  Right lower leg contusion Recent contusion from hitting refrigerator door. Bruising present but no significant complications. - Monitor for worsening symptoms.  Hypokalemia and suspected B group vitamin deficiency Suspected B12 deficiency contributing to dry mouth and lip swelling. No recent potassium supplementation due to loose stools. - Checked potassium levels. - Checked B12 levels.  General Health Maintenance Routine health maintenance discussed, including medication management and lifestyle adjustments. - Continue current medications as adjusted.      I personally spent a total of 35 minutes in the care of the patient today including performing a medically appropriate exam/evaluation, counseling and educating, documenting clinical information in the EHR, independently interpreting results, communicating results, and  coordinating care.   Return in about 6 months (around 09/24/2024) for ok to cancel the december appointment.    Heron CHRISTELLA Sharper, MD

## 2024-03-27 NOTE — Patient Instructions (Signed)
 Reduce torsemide  to 3 times a day.  Wear chap stick at night Ok to take tylenol  for the knee pain, ok to wear the knee sleeve Ok to soak feet in hot water

## 2024-03-28 ENCOUNTER — Telehealth: Payer: Self-pay | Admitting: Orthopedic Surgery

## 2024-03-28 LAB — VITAMIN B12: Vitamin B-12: 583 pg/mL (ref 211–911)

## 2024-03-28 LAB — BASIC METABOLIC PANEL WITH GFR
BUN: 17 mg/dL (ref 6–23)
CO2: 30 meq/L (ref 19–32)
Calcium: 9.4 mg/dL (ref 8.4–10.5)
Chloride: 100 meq/L (ref 96–112)
Creatinine, Ser: 1.09 mg/dL (ref 0.40–1.20)
GFR: 41.29 mL/min — ABNORMAL LOW (ref >60.00)
Glucose, Bld: 100 mg/dL — ABNORMAL HIGH (ref 70–99)
Potassium: 3.6 meq/L (ref 3.5–5.1)
Sodium: 139 meq/L (ref 135–145)

## 2024-03-28 NOTE — Telephone Encounter (Signed)
 Patient called. Would like to know if she could get gel injections in her knee.

## 2024-03-29 ENCOUNTER — Ambulatory Visit: Payer: Self-pay | Admitting: Family Medicine

## 2024-03-29 ENCOUNTER — Other Ambulatory Visit: Payer: Self-pay

## 2024-03-29 DIAGNOSIS — G8929 Other chronic pain: Secondary | ICD-10-CM

## 2024-03-29 NOTE — Telephone Encounter (Signed)
This has been submitted. 

## 2024-03-30 DIAGNOSIS — N951 Menopausal and female climacteric states: Secondary | ICD-10-CM | POA: Diagnosis not present

## 2024-04-13 DIAGNOSIS — H401133 Primary open-angle glaucoma, bilateral, severe stage: Secondary | ICD-10-CM | POA: Diagnosis not present

## 2024-04-13 DIAGNOSIS — H02054 Trichiasis without entropian left upper eyelid: Secondary | ICD-10-CM | POA: Diagnosis not present

## 2024-04-13 DIAGNOSIS — H04123 Dry eye syndrome of bilateral lacrimal glands: Secondary | ICD-10-CM | POA: Diagnosis not present

## 2024-04-17 ENCOUNTER — Ambulatory Visit: Admitting: Surgical

## 2024-04-17 DIAGNOSIS — M1711 Unilateral primary osteoarthritis, right knee: Secondary | ICD-10-CM | POA: Diagnosis not present

## 2024-04-19 ENCOUNTER — Encounter: Payer: Self-pay | Admitting: Surgical

## 2024-04-19 MED ORDER — LIDOCAINE HCL 1 % IJ SOLN
5.0000 mL | INTRAMUSCULAR | Status: AC | PRN
  Administered 2024-04-17: 5 mL

## 2024-04-19 MED ORDER — HYALURONAN 88 MG/4ML IX SOSY
88.0000 mg | PREFILLED_SYRINGE | INTRA_ARTICULAR | Status: AC | PRN
  Administered 2024-04-17: 88 mg via INTRA_ARTICULAR

## 2024-04-19 NOTE — Progress Notes (Signed)
° °  Procedure Note  Patient: Heidi Moore             Date of Birth: 1920-03-17           MRN: [id number]             Visit Date: 04/17/2024  Procedures: Visit Diagnoses:  1. Arthritis of right knee     Large Joint Inj: R knee on 04/17/2024 10:21 AM Indications: diagnostic evaluation, joint swelling and pain Details: 18 G 1.5 in needle, superolateral approach  Arthrogram: No  Medications: 5 mL lidocaine  1 %; 88 mg Hyaluronan 88 MG/4ML Outcome: tolerated well, no immediate complications Procedure, treatment alternatives, risks and benefits explained, specific risks discussed. Consent was given by the patient. Immediately prior to procedure a time out was called to verify the correct patient, procedure, equipment, support staff and site/side marked as required. Patient was prepped and draped in the usual sterile fashion.

## 2024-05-01 ENCOUNTER — Ambulatory Visit: Admitting: Family Medicine

## 2024-05-02 ENCOUNTER — Ambulatory Visit: Payer: Self-pay

## 2024-05-02 ENCOUNTER — Other Ambulatory Visit: Payer: Self-pay | Admitting: Family Medicine

## 2024-05-02 DIAGNOSIS — R600 Localized edema: Secondary | ICD-10-CM

## 2024-05-02 NOTE — Telephone Encounter (Signed)
 FYI Only or Action Required?: Action required by provider: clinical question for provider.  Patient was last seen in primary care on 03/27/2024 by Ozell Heron HERO, MD.  Called Nurse Triage reporting Otalgia.  Symptoms began yesterday.  Interventions attempted: Nothing.  Symptoms are: gradually worsening.  Triage Disposition: See Physician Within 24 Hours  Patient/caregiver understands and will follow disposition?: No, wishes to speak with PCP    Copied from CRM #[id number]. Topic: Clinical - Red Word Triage >> May 02, 2024  3:06 PM Rosina BIRCH wrote: Red Word that prompted transfer to Nurse Triage: both ears are stopped up, itchy and sore but mainly the left ear Reason for Disposition  White, yellow, or green discharge (pus)  Answer Assessment - Initial Assessment Questions 1. LOCATION: Which ear is involved?     Bilateral ear 2. ONSET: When did the ear pain start?    yesterday 3. SEVERITY: How bad is the pain?  (Scale 1-10; mild, moderate or severe)     moderate 4. URI SYMPTOMS: Do you have a runny nose or cough?     na 5. FEVER: Do you have a fever? If Yes, ask: What is your temperature, how was it measured, and when did it start?     na 6. CAUSE: Have you been swimming recently?, How often do you use Q-TIPS?, Have you had any recent air travel or scuba diving?     na 7. OTHER SYMPTOMS: Do you have any other symptoms? (e.g., decreased hearing, dizziness, headache, stiff neck, vomiting)     Itchy ears, bilateral ear swelling,  8. PREGNANCY: Is there any chance you are pregnant? When was your last menstrual period?     Na  Pt stated the ear discomfort is back and is also affecting the other ear and both ears area swollen.  Pt would like to know if the ear medication can be refilled and sent to pharmacy.please call and let pt know  Protocols used: Rilla

## 2024-05-02 NOTE — Telephone Encounter (Signed)
Home number busy x2. 

## 2024-05-02 NOTE — Telephone Encounter (Signed)
 If her ears are painful then I would recommend a visit so I can look at them to see if she needs more than just drops.

## 2024-05-03 NOTE — Telephone Encounter (Signed)
 FYI Only or Action Required?: FYI only for provider: appointment scheduled on 05/08/24.  Patient was last seen in primary care on 03/27/2024 by Heidi Heron HERO, MD.  Called Nurse Triage reporting Otalgia.  Symptoms began a week ago.  Interventions attempted: Prescription medications: drops.  Symptoms are: stable.  Triage Disposition: See Physician Within 24 Hours  Patient/caregiver understands and will follow disposition?: No, wishes to speak with PCP   Son Heidi Moore calling in today, wants to schedule appointment with PCP. Ears plugged. Reports extremely diminished hearing with hearing aids, ears swollen. Scheduled next available in PCP office. Son stated he is in from [place] for the holiday and would love the chance to meet [name] in person if she is not when with a patient when they come in Monday.

## 2024-05-05 ENCOUNTER — Ambulatory Visit: Payer: Self-pay

## 2024-05-05 ENCOUNTER — Emergency Department (HOSPITAL_BASED_OUTPATIENT_CLINIC_OR_DEPARTMENT_OTHER)
Admission: EM | Admit: 2024-05-05 | Discharge: 2024-05-05 | Disposition: A | Discharge status: Home / Self Care | Attending: Emergency Medicine | Admitting: Emergency Medicine

## 2024-05-05 ENCOUNTER — Encounter (HOSPITAL_BASED_OUTPATIENT_CLINIC_OR_DEPARTMENT_OTHER): Payer: Self-pay | Admitting: Emergency Medicine

## 2024-05-05 ENCOUNTER — Other Ambulatory Visit: Payer: Self-pay

## 2024-05-05 DIAGNOSIS — H60392 Other infective otitis externa, left ear: Secondary | ICD-10-CM | POA: Diagnosis not present

## 2024-05-05 DIAGNOSIS — I4891 Unspecified atrial fibrillation: Secondary | ICD-10-CM | POA: Diagnosis not present

## 2024-05-05 DIAGNOSIS — H9202 Otalgia, left ear: Secondary | ICD-10-CM | POA: Diagnosis present

## 2024-05-05 DIAGNOSIS — Z79899 Other long term (current) drug therapy: Secondary | ICD-10-CM | POA: Diagnosis not present

## 2024-05-05 MED ORDER — NEOMYCIN-POLYMYXIN-HC 3.5-10000-1 OT SUSP: 4.0000 [drp] | Freq: Three times a day (TID) | OTIC | 0 refills | Status: AC

## 2024-05-05 MED ORDER — CLINDAMYCIN HCL 300 MG PO CAPS: 300.0000 mg | ORAL_CAPSULE | Freq: Three times a day (TID) | ORAL | 0 refills | Status: DC

## 2024-05-05 NOTE — Telephone Encounter (Signed)
 Also advised patient to seek treatment at an urgent care if needed prior and she declined.

## 2024-05-05 NOTE — Telephone Encounter (Signed)
 Noted- ok to close.

## 2024-05-05 NOTE — ED Triage Notes (Signed)
 Reports bilateral ear infection and ear fullness x 2 weeks. Denies any other symptoms.

## 2024-05-05 NOTE — Telephone Encounter (Signed)
 She really needs to schedule a visit so someone can look in her ears

## 2024-05-05 NOTE — Telephone Encounter (Signed)
 FYI Only or Action Required?: FYI only for provider: going to [facility] ER for symptoms.  Patient was last seen in primary care on 03/27/2024 by Ozell Heron HERO, MD.  Called Nurse Triage reporting Otalgia.  Symptoms began several days ago.  Interventions attempted: Other: unknown.  Symptoms are: gradually worsening.  Triage Disposition: See HCP Within 4 Hours (Or PCP Triage)  Patient/caregiver understands and will follow disposition?: Yes               Copied from CRM #[id number]. Topic: Clinical - Red Word Triage >> May 05, 2024 10:35 AM Charlet HERO wrote: Red Word that prompted transfer to Nurse Triage: Patient son is calling bc patient has swollen ears and one is itching and starting to hurt, and she is not able to hear. Dr Ozell Reason for Disposition  Walking is very unsteady or feels very dizzy  Answer Assessment - Initial Assessment Questions 1. LOCATION: Which ear is involved?     Both ears are symptomatic. Left ear is painful.  2. ONSET: When did the ear pain start?      05/01/24.  3. SEVERITY: How bad is the pain?  (Scale 1-10; mild, moderate or severe)     Moderate.  4. URI SYMPTOMS: Do you have a runny nose or cough?     No.  5. FEVER: Do you have a fever? If Yes, ask: What is your temperature, how was it measured, and when did it start?     No.  6. CAUSE: Have you been swimming recently?, How often do you use Q-TIPS?, Have you had any recent air travel or scuba diving?     No.  7. OTHER SYMPTOMS: Do you have any other symptoms? (e.g., decreased hearing, dizziness, headache, stiff neck, vomiting)     Decreased hearing (severe) in both ears, swelling and itching in both ears. No discharge from ears.  Protocols used: Rilla

## 2024-05-05 NOTE — Discharge Instructions (Signed)
 It was a pleasure meeting with you today. There was redness seen within the canal of your left ear after the tissue paper was removed with cleaning. This redness could be due to irritation from the lodged tissue paper or due to infection. We will treat this as an infection of the ear canal or otitis externa. We will send the same medications used by your primary care physician for previous otitis externa of the right ear in October of this year to your pharmacy. Follow up with your primary care provider within the next week for reassessment of the ear to ensure these symptoms are improving. Please return if symptoms worsen, fever develops, or any other new or concerning symptoms develop.

## 2024-05-05 NOTE — ED Provider Notes (Signed)
 " [place] EMERGENCY DEPARTMENT AT [facility] Provider Note   CSN: [id number] Arrival date & time: 05/05/24  1220     Patient presents with: Ear Fullness   Heidi Moore is a 89 y.o. female.   Patient is here for evaluation of Left ear pain, itching, and fullness x 4-5 days. Accompanied by her son today. Patient is hard of hearing at baseline and typically wears hearing aids in both ears. This pain and fullness have become worse over time. She denies drainage from the ear. She tried flushing the Left ear yesterday evening with water and vinegar without relief. Patient is emotional today going on to say that she reached out to PCP and was disappointed to hear that they could not refill previous antibiotic prescription for ear infection over the phone and would need to see her. The soonest they could work her in would be Monday, 12/29. She admits to feeling nervous and that is why she was crying when she was leaving the bathroom prior to our visit. History of A fib. Denies shortness of breath, dizziness, chest pain, headache, confusion.  Patient was seen by her PCP on 10/17 and diagnosed with Right ear otitis externa. She has several antibiotic allergies and was prescribed oral Cleocin  and Neomycin /Polymyxin B with hydrocortisone  drops. Symptoms resolved fully. New symptoms developed about 4-5 days ago.  On reassessment, patient admits to using Kleenex in both ears when cleaning her ears with water and vinegar.  The history is provided by the patient and a relative.  Ear Fullness       Prior to Admission medications  Medication Sig Start Date End Date Taking? Authorizing Provider  Estradiol  0.5 MG/0.5GM GEL Place 1 packet onto the skin daily. 04/17/24  Yes [provider]  neomycin -polymyxin-hydrocortisone  (CORTISPORIN ) 3.5-10000-1 OTIC suspension Place 4 drops into the left ear 3 (three) times daily for 10 days. 05/05/24 05/15/24 Yes Rosina Norris A, [name]   acetaminophen  (TYLENOL ) 325 MG tablet Take 650 mg by mouth every 6 (six) hours as needed for mild pain or headache.    [provider]  acetic acid -hydrocortisone  (VOSOL -HC) OTIC solution Place 3 drops into both ears 2 (two) times daily. 05/25/22   Ozell Heron HERO, MD  cholecalciferol  (VITAMIN D ) 1000 UNITS tablet Take 1,000 Units by mouth 2 (two) times daily.     [provider]  clindamycin  (CLEOCIN ) 300 MG capsule Take 1 capsule (300 mg total) by mouth 3 (three) times daily. 05/05/24   Rosina Norris LABOR, [name]  Cyanocobalamin  (VITAMIN B 12 PO) Take by mouth daily.    [provider]  Estradiol  0.25 MG/0.25GM GEL Place 0.25 mg onto the skin daily. 11/15/23   Ozell Heron HERO, MD  fexofenadine (ALLEGRA  ALLERGY  CHILDRENS) 30 MG/5ML suspension 1/4 tsp daily 08/20/21   Jeneal Danita Macintosh, MD  ondansetron  (ZOFRAN ) 4 MG tablet Take 1 tablet (4 mg total) by mouth every 8 (eight) hours as needed for nausea or vomiting. 08/31/23   Ozell Heron HERO, MD  OVER THE COUNTER MEDICATION 2 (two) times daily. Preservision vitamin for eyes    [provider]  potassium chloride  (KLOR-CON  M) 20 MEQ tablet Take 1 tablet (20 mEq total) by mouth daily. Patient taking differently: Take 5 mEq by mouth daily. 07/26/23   Ozell Heron HERO, MD  torsemide  (DEMADEX ) 5 MG tablet Take 2 tablets by mouth twice daily 05/02/24   Ozell Heron HERO, MD  VITAMIN A PO Take by mouth daily.    [provider]  vitamin E 400 UNIT capsule Take 1 capsule by mouth daily.    [provider]    Allergies: Alendronate sodium, Anaplex hd, Aspirin , Azithromycin, Calcium , Ciprofloxacin, Codeine, Dorzolamide hcl-timolol mal, Doxycycline , Eliquis  [apixaban ], Gabapentin , Hydrocodone, Ibandronate sodium, Ibuprofen, Penicillins, Pradaxa  [dabigatran  etexilate mesylate], Raloxifene, Risedronate sodium, Sulfonamide derivatives, Teriparatide, Travoprost, and Xarelto  [rivaroxaban ]    Review  of Systems  HENT:  Positive for ear pain and hearing loss. Negative for ear discharge.    Vitals:   05/05/24 1258 05/05/24 1348  BP: 123/82 127/70  Pulse: 94 87  Resp: 18 18  Temp: 97.9 F (36.6 C) 98.2 F (36.8 C)  TempSrc: Oral   SpO2: 99% 97%    Updated Vital Signs BP 127/70 (BP Location: Right Arm)   Pulse 87   Temp 98.2 F (36.8 C)   Resp 18   SpO2 97%   Physical Exam Vitals and nursing note reviewed.  Constitutional:      General: She is not in acute distress.    Appearance: Normal appearance. She is not ill-appearing, toxic-appearing or diaphoretic.     Comments: Thin  HENT:     Head: Normocephalic and atraumatic.     Right Ear: Ear canal and external ear normal.     Left Ear: Ear canal and external ear normal.     Ears:     Comments: No external ear tenderness bilaterally. No erythema of ear canal bilaterally. Unable to visualize TM bilaterally due to white foreign material. This appears to be white-colored cerumen or foreign body of some type.     Nose: Nose normal.     Mouth/Throat:     Mouth: Mucous membranes are moist.     Pharynx: Oropharynx is clear.  Eyes:     General: No scleral icterus.    Extraocular Movements: Extraocular movements intact.     Conjunctiva/sclera: Conjunctivae normal.  Cardiovascular:     Rate and Rhythm: Normal rate. Rhythm irregular.     Comments: Afib Pulmonary:     Effort: Pulmonary effort is normal. No respiratory distress.  Musculoskeletal:        General: Normal range of motion.     Cervical back: Normal range of motion. No tenderness.  Lymphadenopathy:     Cervical: No cervical adenopathy.  Skin:    General: Skin is warm and dry.     Coloration: Skin is not jaundiced or pale.  Neurological:     Mental Status: She is alert and oriented to person, place, and time.     (all labs ordered are listed, but only abnormal results are displayed) Labs Reviewed - No data to display  EKG: None  Radiology: No results  found.   Procedures   Medications Ordered in the ED - No data to display   Patient presents to the ED for concern of Left ear pain, itchiness, and fullness, this involves an extensive number of treatment options, and is a complaint that carries with it a high risk of complications and morbidity.  The differential diagnosis includes acute otitis media, acute otitis externa, otitis effusion, fungal infection, cholesteatoma, foreign body.   Co morbidities that complicate the patient evaluation  Multiple antibiotic allergies   Additional history obtained:  Additional history obtained from  Family and Outside Medical Records   External records from outside source obtained and reviewed including additional history provided by the son and review of recent primary care notes to review previous physical exam and treatment of right-side otitis externa.  Problem List / ED Course:     Left ear pain, itching, fullness. No external ear tenderness bilaterally. No erythema of ear canal bilaterally. Unable to visualize TM bilaterally due to white foreign material. This appears to be white-colored cerumen or foreign body of some type. We will irrigate the ears and reassess. White-colored foreign body found to be tissue paper and slowly dissolved with copious irrigation. On reassessment, bilateral TM are clear. Erythema and swelling of the left canal -- this could be irritation from lodged tissue paper or otitis externa. Based on patient symptoms, will treat as otitis externa and send same medications used with previous otitis externa infection to pharmacy. Return precautions discussed, patient and son verbalized understanding.    Reevaluation:  After the interventions noted above, I reevaluated the patient and found that they have :improved   Social Determinants of Health:  Extreme age    Dispostion:  After consideration of the diagnostic results and the patients response to treatment, I  feel that the patient would benefit from supportive care in the home setting with antibiotics sent to pharmacy. Follow up with PCP for reassessment. Return precautions given.     Medical Decision Making  This note was produced using Dragon [employer] voice recognition. While the provider has reviewed and verified all clinical information, transcription errors may remain.    Final diagnoses:  Other infective acute otitis externa of left ear    ED Discharge Orders          Ordered    clindamycin  (CLEOCIN ) 300 MG capsule  3 times daily        05/05/24 1826    neomycin -polymyxin-hydrocortisone  (CORTISPORIN ) 3.5-10000-1 OTIC suspension  3 times daily        05/05/24 1826               Rosina Almarie LABOR, [name] 05/05/24 1836    Pamella Ozell LABOR, DO 05/07/24 2014  "

## 2024-05-05 NOTE — Telephone Encounter (Signed)
 Spoke with the patient and informed her of the message below.  Patient stated she has an appt on 12/29 and will wait until this visit.

## 2024-05-08 ENCOUNTER — Encounter: Payer: Self-pay | Admitting: Family Medicine

## 2024-05-08 ENCOUNTER — Ambulatory Visit: Admitting: Family Medicine

## 2024-05-08 VITALS — BP 100/62 | HR 45 | Temp 97.9°F | Ht 61.0 in | Wt 99.0 lb

## 2024-05-08 DIAGNOSIS — H60392 Other infective otitis externa, left ear: Secondary | ICD-10-CM

## 2024-05-08 MED ORDER — CLINDAMYCIN PALMITATE HCL 75 MG/5ML PO SOLR: 300.0000 mg | Freq: Three times a day (TID) | ORAL | 0 refills | Status: AC

## 2024-05-08 NOTE — Progress Notes (Signed)
 "  Acute Office Visit   Subjective:  Patient ID: Heidi Moore, female    DOB: 12-Jan-1920, 89 y.o.   MRN: [id number]  Chief Complaint  Patient presents with   Ear Fullness    HPI:  Patient is here for emergency department follow up. She is accompanied by son and another family member. Patient was seen at [facility] ED at [facility] on 05/05/2025 and diagnosed with infective acute otitis externa of left ear. She presented with left ear pain, itching, and fullness for 4-5 days. Also, had toilet paper impaction and had bilateral ear lavage completed.  She was prescribed oral Clindamycin  tablets, but only had one dose of Clindamycin  since it causes upset stomach and she taking Cortisporin  ear drops as directed. She reports both ears itch, but not having pain.  See HPI above      Objective:    BP 100/62   Pulse (!) 45   Temp 97.9 F (36.6 C) (Oral)   Ht 5' 1 (1.549 m)   Wt 99 lb (44.9 kg)   SpO2 95%   BMI 18.71 kg/m    Physical Exam Vitals reviewed.  Constitutional:      General: She is not in acute distress.    Appearance: Normal appearance. She is not ill-appearing, toxic-appearing or diaphoretic.  HENT:     Head: Normocephalic and atraumatic.     Right Ear: Tympanic membrane normal. There is no impacted cerumen.     Left Ear: Tympanic membrane normal. There is no impacted cerumen.     Ears:     Comments: Left ear canal still appears irritated and red. Flaky skin noted on canal.   Eyes:     General:        Right eye: No discharge.        Left eye: No discharge.     Conjunctiva/sclera: Conjunctivae normal.  Cardiovascular:     Rate and Rhythm: Normal rate and regular rhythm.     Heart sounds: Normal heart sounds. No murmur heard.    No friction rub. No gallop.  Pulmonary:     Effort: Pulmonary effort is normal. No respiratory distress.     Breath sounds: Normal breath sounds.  Musculoskeletal:        General: Normal range of motion.  Skin:    General: Skin is warm and dry.   Neurological:     General: No focal deficit present.     Mental Status: She is alert and oriented to person, place, and time. Mental status is at baseline.  Psychiatric:        Mood and Affect: Mood normal.        Behavior: Behavior normal.        Thought Content: Thought content normal.        Judgment: Judgment normal.       Assessment & Plan:  Infection of left external ear -     Clindamycin  Palmitate HCl; Take 20 mLs (300 mg total) by mouth 3 (three) times daily for 5 days.  Dispense: 300 mL; Refill: 0  -Continue taking Cortisporin  (Neomycin -Polymyxin-Hydrocortisone ) ear drops. Place 4 drops into the left ear three times a daily for remainder 10 days.  -Stop taking Clindamycin  pills. Changed to liquid form since she is unable to tolerate pill form. Take (4 teaspoons) by mouth three times a day for 5 days. -Advised to please do not put anything in ear canal than hearing aid.  -Follow up with [name] if not improved  after taking antibiotics or symptoms become worse.   Malynn Lucy, NP "

## 2024-05-08 NOTE — Patient Instructions (Addendum)
-  It was a pleasure to care for you today.  -Continue taking Cortisporin  (Neomycin -Polymyxin-Hydrocortisone ) ear drops. Place 4 drops into the left ear three times a daily for remainder 10 days.  -Stop taking Clindamycin  pills. Changed to liquid form. Take (4 teaspoons) by mouth three times a day for 5 days. -Please do not put anything in your ear canal than your hearing aid.  -Follow up with [name] if not improved after taking antibiotics or symptoms become worse.

## 2024-05-12 ENCOUNTER — Ambulatory Visit: Payer: Self-pay | Admitting: Family Medicine

## 2024-05-12 ENCOUNTER — Telehealth: Admitting: Family Medicine

## 2024-05-12 DIAGNOSIS — I872 Venous insufficiency (chronic) (peripheral): Secondary | ICD-10-CM

## 2024-05-12 DIAGNOSIS — N951 Menopausal and female climacteric states: Secondary | ICD-10-CM | POA: Diagnosis not present

## 2024-05-12 MED ORDER — ESTRADIOL 1 MG/GM TD GEL: 1.0000 mg | Freq: Every day | TRANSDERMAL | Status: DC

## 2024-05-12 NOTE — Progress Notes (Signed)
 "  Virtual Medical Office Visit  Patient:  Heidi Moore      Age: 89 y.o.       Sex:  female  Date:   05/12/2024  PCP:    Ozell Heron HERO, MD   Today's Healthcare Provider: Heron HERO Ozell, MD    Assessment/Plan:   Summary assessment:  Heidi Moore was seen today for medication problem.  Menopausal symptoms -     Estradiol ; Place 1 mg onto the skin daily.  Chronic venous insufficiency of lower extremity    Assessment and Plan    Menopausal symptoms Experiencing menopausal symptom of crying spells, no hot flashes or other issues. Patient had been previously decreased to 0.5 mg gel once daily in the last year and up until now has tolerated the 0.5 mg dose. While  Estrogen is not FDA approved for treating depression, it may improve some depressive symptoms. The current dose is 0.5 mg daily, and patient is asking to increase to 1 mg daily. The decision to increase the dose is based on the understanding of the risks and benefits, with a focus on symptom management. - Increase estrogen gel to 0.5 mg daily or use 1 mg gel once daily I-- I spent over 30 minutes with the patient and family today discussing the risks of blood clot and stroke with the use of higher doses of estrogen.  - Monitor symptoms and adjust dosage as needed. - Consider video visit if symptoms persist or worsen.  Chronic venous insufficiency of lower extremity The focus is on managing symptoms such as ankle and leg swelling and shortness of breath rather than urine output. The current regimen is effective in managing symptoms, and there is no need to adjust the dose unless symptoms worsen. The importance of monitoring symptoms and adjusting fluid intake was discussed. Son expresses concern about her losing weight. We discussed the use of protein powders and protein shakes to help increase calories, but also that this might be a normal sign of aging. We discussed that she could take her torsemide  just as needed for the  swelling, that she did not have to take it on a strict schedule, just monitor her legs for swelling and weight herself daily.  - Monitor for symptoms of swelling and shortness of breath. - Adjust fluid intake as needed to manage symptoms.       No follow-ups on file.   She was advised to call the office or go to ER if her condition worsens    Subjective:   Heidi Moore is a 89 y.o. female with PMH significant for: Past Medical History:  Diagnosis Date   A-fib (HCC)    AC (acromioclavicular) joint bone spurs    on right shoulder    Allergic rhinitis    Arthritis    Bursitis    right shoulder   Complication of anesthesia    headaches   Diverticulosis    DIVERTICULOSIS, COLON 10/15/2006   Qualifier: Diagnosis of  By: Tish MD, Elsie Slack problem    treated by Dr Lelon per patient   Yzjijryz(215.9)    sinus HA   History of blood transfusion 65yrs ago   no abnormal reaction noted   History of cystitis    History of skin cancer    Basal cell, Dr. Dempsey Dials   Hyperlipidemia    Framingham Study LDL goal =<160    Hypotension    Joint pain    Joint swelling  Macular degeneration, dry    Osteopenia    Intolerance to Calcium ,Actonel,Fosamax,Evista, Forteo: S/P Boniva 2007 to 03/2010   OSTEOPENIA 03/12/2008   Qualifier: Diagnosis of  By: Everet Pebbles     Osteoporosis    PONV (postoperative nausea and vomiting)    Rosacea 04/22/2009   Qualifier: Diagnosis of  By: Tish MD, Elsie [place] cancer 06/22/2017   per patient after excision right calf by Dr Cary   SKIN CANCER, HX OF 03/12/2008   Qualifier: Diagnosis of  By: Everet Pebbles     Urinary frequency    Vaginal atrophy    sees Dr. Evangeline     Presenting today with: Chief Complaint  Patient presents with   Medication Problem    Patient states she feels emotional and crying and family requests dose change of Estradiol      She clarifies and reports that her condition: Discussed the use of AI  scribe software for clinical note transcription with the patient, who gave verbal consent to proceed.  History of Present Illness   Heidi Moore is a 89 year old female who presents with concerns about her estrogen medication dosage and its effects.  She is on estrogen therapy, currently taking 0.5 mg once daily, and is concerned that emotional symptoms around the holidays may be related to this dose. She previously tried lower doses and transdermal patches but prefers to continue estrogen gel. She also takes spironolactone for ankle and leg swelling and shortness of breath. She has had a hysterectomy, which affects her estrogen requirements.       She denies having any:  Current leg swelling        Objective/Observations  Physical Exam:  Polite and friendly Gen: NAD, resting comfortably Pulm: Normal work of breathing Neuro: Grossly normal, moves all extremities Psych: Normal affect and thought content Problem specific physical exam findings:    No images are attached to the encounter or orders placed in the encounter.    Results: No results found for any visits on 05/12/24.   Recent Results (from the past 2160 hours)  Basic metabolic panel with GFR     Status: Abnormal   Collection Time: 03/27/24  4:05 PM  Result Value Ref Range   Sodium 139 135 - 145 mEq/L   Potassium 3.6 3.5 - 5.1 mEq/L   Chloride 100 96 - 112 mEq/L   CO2 30 19 - 32 mEq/L    Comment: Elevated LDH levels may cause falsely increased CO2 results. If LDH is >2000 U/L, a positive bias of 12% is possible.   Glucose, Bld 100 (H) 70 - 99 mg/dL   BUN 17 6 - 23 mg/dL   Creatinine, Ser 8.90 0.40 - 1.20 mg/dL   GFR 58.70 (L) >39.99 mL/min    Comment: Calculated using the CKD-EPI Creatinine Equation (2021)   Calcium  9.4 8.4 - 10.5 mg/dL  Vitamin B12     Status: None   Collection Time: 03/27/24  4:05 PM  Result Value Ref Range   Vitamin B-12 583 211 - 911 pg/mL           Virtual Visit via Video   I  connected with Heidi Moore on 05/12/2024 at  4:00 PM EST by a video enabled telemedicine application and verified that I am speaking with the correct person using two identifiers. The limitations of evaluation and management by telemedicine and the availability of in person appointments were discussed. The patient expressed understanding and agreed to  proceed.   Percentage of appointment time on video:  100% Patient location: Home Provider location:Home Office Persons participating in the virtual visit: Myself and Patient     "

## 2024-05-12 NOTE — Telephone Encounter (Signed)
" °  FYI Only or Action Required?: Action required by provider: clinical question for provider and update on patient condition.  Patient was last seen in primary care on 05/08/2024 by Billy Philippe SAUNDERS, NP.  Called Nurse Triage reporting Medication Problem.    Triage Disposition: Call PCP Now        Patient/caregiver understands and will follow disposition?: YesCopied from CRM [id number]. Topic: Clinical - Red Word Triage >> May 12, 2024 12:16 PM Ana L wrote: [facility] that prompted transfer to Nurse Triage: Extremely emotional. Reason for Disposition  [1] Caller requests to speak ONLY to PCP AND [2] URGENT question  Answer Assessment - Initial Assessment Questions 1. REASON FOR CALL or QUESTION: What is your reason for calling today? or How can I best    Patient son called in to report the patient is more emotional than normal. He stated the Estradiol  0.5 MG/0.5GM GEL needs to be increased. He wants her prescription increased to two doses per day. He also wants to discuss her diuretic medication, as he wants her to possibly be on a different one. He also is seeking an ENT referral. Please advise.   Stepanie Graver (son) [phone]  Protocols used: PCP Call - No Triage-A-AH  "

## 2024-05-12 NOTE — Telephone Encounter (Signed)
 Needs appointment to discuss changes to medication

## 2024-05-12 NOTE — Telephone Encounter (Signed)
 Spoke with the patient and scheduled a virtual visit today at 4pm.

## 2024-05-13 ENCOUNTER — Other Ambulatory Visit: Payer: Self-pay | Admitting: Family Medicine

## 2024-05-13 DIAGNOSIS — N951 Menopausal and female climacteric states: Secondary | ICD-10-CM

## 2024-05-30 ENCOUNTER — Ambulatory Visit: Admitting: Surgical

## 2024-05-30 ENCOUNTER — Other Ambulatory Visit (INDEPENDENT_AMBULATORY_CARE_PROVIDER_SITE_OTHER): Payer: Self-pay

## 2024-05-30 ENCOUNTER — Other Ambulatory Visit: Payer: Self-pay

## 2024-05-30 DIAGNOSIS — M25561 Pain in right knee: Secondary | ICD-10-CM | POA: Diagnosis not present

## 2024-05-30 DIAGNOSIS — M1711 Unilateral primary osteoarthritis, right knee: Secondary | ICD-10-CM | POA: Diagnosis not present

## 2024-05-31 ENCOUNTER — Encounter: Payer: Self-pay | Admitting: Surgical

## 2024-05-31 MED ORDER — LIDOCAINE HCL 1 % IJ SOLN
5.0000 mL | INTRAMUSCULAR | Status: AC | PRN
  Administered 2024-05-30: 5 mL

## 2024-05-31 MED ORDER — METHYLPREDNISOLONE ACETATE 40 MG/ML IJ SUSP
40.0000 mg | INTRAMUSCULAR | Status: AC | PRN
  Administered 2024-05-30: 40 mg via INTRA_ARTICULAR

## 2024-05-31 MED ORDER — BUPIVACAINE HCL 0.25 % IJ SOLN
4.0000 mL | INTRAMUSCULAR | Status: AC | PRN
  Administered 2024-05-30: 4 mL via INTRA_ARTICULAR

## 2024-05-31 NOTE — Progress Notes (Signed)
 "  Office Visit Note   Patient: Heidi Moore           Date of Birth: 11-06-1919           MRN: [id number] Visit Date: 05/30/2024 Requested by: Ozell Heron HERO, MD [address] [place],  [place] [address] PCP: Ozell Heron HERO, MD  Subjective: Chief Complaint  Patient presents with   Right Knee - Pain    HPI: Heidi Moore is a 89 y.o. female who presents to the office reporting knee pain.  Has history of knee arthritis.  No new falls or injuries.  No fevers or chills.  Had recent gel injection on 04/17/2024 into the right knee that provided good relief of her right knee pain.  This lasted for several weeks and she states that today she has no right knee pain, only a soreness.  Most of her pain is in the medial aspect of the knee.  She does have some bruising that is present on the medial aspect of the knee as well.  She has difficulty ambulating at times due to the soreness.  No groin pain or radicular pain..                ROS: All systems reviewed are negative as they relate to the chief complaint within the history of present illness.  Patient denies fevers or chills.  Assessment & Plan: Visit Diagnoses:  1. Arthritis of right knee   2. Right knee pain, unspecified chronicity     Plan: Impression is 89 year old female who is here for reevaluation of right knee pain.  She has history of right knee arthritis with arthritis primarily in the lateral compartment and not much in the way of radiographic arthritis in the medial compartment where most of her pain is today.  We discussed the possibility of more occult arthritis that could be contributing to her pain or it could be stress fracture versus osteonecrosis that could be contributing to more medial sided pain that we could not see with x-ray.  After discussion of options, patient and daughter would like to have aspiration and injection with cortisone to see if this will provide relief.  Historically, this is only helped  her for 2 to 3 days but I think it is worth trying.  About 23 cc of nonpurulent synovial fluid was aspirated prior to cortisone injection.  Patient tolerated procedure well without complication.  We will see how this does for her and if no improvement, could consider MRI scan to further evaluate the source of her knee pain though this would really be just to rule out any fracture that would necessitate a period of decreased activity and would not anticipate any knee surgery in her future.  If she continues to have primarily medial pain, could consider genicular nerve block/ablation  Follow-Up Instructions: No follow-ups on file.   Orders:  Orders Placed This Encounter  Procedures   XR Knee 1-2 Views Right   US  Guided Needle Placement - No Linked Charges   No orders of the defined types were placed in this encounter.     Procedures: Large Joint Inj: R knee on 05/30/2024 12:00 PM Indications: diagnostic evaluation, joint swelling and pain Details: 18 G 1.5 in needle, superolateral approach  Arthrogram: No  Medications: 5 mL lidocaine  1 %; 4 mL bupivacaine  0.25 %; 40 mg methylPREDNISolone  acetate 40 MG/ML Outcome: tolerated well, no immediate complications Procedure, treatment alternatives, risks and benefits explained, specific risks discussed. Consent  was given by the patient. Immediately prior to procedure a time out was called to verify the correct patient, procedure, equipment, support staff and site/side marked as required. Patient was prepped and draped in the usual sterile fashion.       Clinical Data: No additional findings.  Objective: Vital Signs: There were no vitals taken for this visit.  Physical Exam:  Constitutional: Patient appears well-developed HEENT:  Head: Normocephalic Eyes:EOM are normal Neck: Normal range of motion Cardiovascular: Normal rate Pulmonary/chest: Effort normal Neurologic: Patient is alert Skin: Skin is warm Psychiatric: Patient has normal  mood and affect  Ortho Exam: Ortho exam demonstrates knees without cellulitis or skin changes.  Effusion present.  No calf tenderness.  Negative Homans' sign.  No pain with hip range of motion.  Able to perform straight leg raise with both lower extremities.  Lower extremities warm and well-perfused.  Tender over the medial joint line moderately and lateral joint line mildly.  Small ecchymosis noted in line with the medial femoral condyle on the medial aspect of the knee.  Excellent quad and hamstring strength.  Ambulates with minimal antalgia with the use of a cane.  Specialty Comments:  No specialty comments available.  Imaging: No results found.   PMFS History: Patient Active Problem List   Diagnosis Date Noted   External otitis of right ear 02/25/2024   Menopausal symptoms 06/07/2023   Hypercoagulable state due to persistent atrial fibrillation (HCC) 02/02/2023   Synovitis of right knee 11/10/2022   Atrial fibrillation (HCC) 11/09/2022   Chronic eczematous otitis externa of both ears 05/25/2022   Anxiety 05/25/2022   Dizziness 12/12/2021   Dysuria 12/04/2021   Idiopathic polyneuropathy 12/04/2021   Hypothyroidism 12/04/2021   Optic atrophy of both eyes 12/10/2020   Intermediate stage nonexudative age-related macular degeneration of both eyes 05/21/2020   Primary open angle glaucoma of both eyes, severe stage 05/21/2020   Scarring, chorioretinal, 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 Dolin 08/24/2012   LOW BACK PAIN SYNDROME 05/07/2010   Vitamin D  deficiency 04/22/2009   Allergic rhinitis 03/12/2008   Hyperlipemia 10/22/2006   Past Medical History:  Diagnosis Date   A-fib (HCC)    AC (acromioclavicular) joint bone spurs    on right shoulder    Allergic rhinitis    Arthritis     Bursitis    right shoulder   Complication of anesthesia    headaches   Diverticulosis    DIVERTICULOSIS, COLON 10/15/2006   Qualifier: Diagnosis of  By: Tish MD, Elsie Slack problem    treated by Dr Lelon per patient   Yzjijryz(215.9)    sinus HA   History of blood transfusion 23yrs ago   no abnormal reaction noted   History of cystitis    History of skin cancer    Basal cell, Dr. Dempsey Dials   Hyperlipidemia    Framingham Study LDL goal =<160    Hypotension    Joint pain    Joint swelling    Macular degeneration, dry    Osteopenia    Intolerance to Calcium ,Actonel,Fosamax,Evista, Forteo: S/P Boniva 2007 to 03/2010   OSTEOPENIA 03/12/2008   Qualifier: Diagnosis of  By: Everet Pebbles     Osteoporosis    PONV (postoperative nausea and vomiting)    Rosacea 04/22/2009  Qualifier: Diagnosis of  By: Tish MD, William     Skin cancer 06/22/2017   per patient after excision right calf by Dr Cary   SKIN CANCER, HX OF 03/12/2008   Qualifier: Diagnosis of  By: Everet Pebbles     Urinary frequency    Vaginal atrophy    sees Dr. Evangeline    Family History  Problem Relation Age of Onset   Stroke Mother 72   Lung cancer Sister    Diabetes Brother    Atrial fibrillation Brother        coumadin   Colon cancer Other        1st Cousin    Allergic rhinitis Son    Allergic rhinitis Daughter     Past Surgical History:  Procedure Laterality Date   APPENDECTOMY     BUNIONECTOMY Bilateral    CATARACT EXTRACTION, BILATERAL     COLONOSCOPY     with Tics (no further f/u as per GI due to age)    JOINT REPLACEMENT Left    knee    KNEE ARTHROPLASTY  05/18/2012   Procedure: COMPUTER ASSISTED TOTAL KNEE ARTHROPLASTY;  Surgeon: Oneil JAYSON Herald, MD;  Location: [facility] OR;  Service: Orthopedics;  Laterality: Left;  Left Total Knee Arthroplasty, Cemented, Computer Assisted   lt knee      meniscus tear   MOHS SURGERY     right nostril for basel cell cancer    NOSE SURGERY     due to  trama at age 3    TOTAL ABDOMINAL HYSTERECTOMY W/ BILATERAL SALPINGOOPHORECTOMY     for Endometriosis    TOTAL SHOULDER ARTHROPLASTY Right 09/11/2013   Procedure: TOTAL SHOULDER ARTHROPLASTY;  Surgeon: Oneil JAYSON Herald, MD;  Location: [facility] OR;  Service: Orthopedics;  Laterality: Right;  Right Total Shoulder Arthroplasty   Social History   Occupational History    Employer: RETIRED  Tobacco Use   Smoking status: Never    Passive exposure: Never   Smokeless tobacco: Never   Tobacco comments:    Never smoked 02/02/23  Vaping Use   Vaping status: Never Used  Substance and Sexual Activity   Alcohol use: No   Drug use: No   Sexual activity: Not Currently    Birth control/protection: Surgical         "

## 2024-06-12 ENCOUNTER — Ambulatory Visit: Admitting: Orthopedic Surgery

## 2024-07-26 ENCOUNTER — Ambulatory Visit

## 2024-09-28 ENCOUNTER — Ambulatory Visit: Admitting: Family Medicine
